# Patient Record
Sex: Female | Born: 1945 | Race: White | Hispanic: No | Marital: Married | Smoking: Former smoker
Health system: Southern US, Community
[De-identification: ages and names within clinical notes are randomized; demographics above are authoritative.]

## PROBLEM LIST (undated history)

## (undated) DIAGNOSIS — Z86018 Personal history of other benign neoplasm: Secondary | ICD-10-CM

## (undated) DIAGNOSIS — M1990 Unspecified osteoarthritis, unspecified site: Secondary | ICD-10-CM

## (undated) DIAGNOSIS — R3915 Urgency of urination: Secondary | ICD-10-CM

## (undated) DIAGNOSIS — K08109 Complete loss of teeth, unspecified cause, unspecified class: Secondary | ICD-10-CM

## (undated) DIAGNOSIS — M949 Disorder of cartilage, unspecified: Secondary | ICD-10-CM

## (undated) DIAGNOSIS — D509 Iron deficiency anemia, unspecified: Secondary | ICD-10-CM

## (undated) DIAGNOSIS — K469 Unspecified abdominal hernia without obstruction or gangrene: Secondary | ICD-10-CM

## (undated) DIAGNOSIS — Z972 Presence of dental prosthetic device (complete) (partial): Secondary | ICD-10-CM

## (undated) DIAGNOSIS — E782 Mixed hyperlipidemia: Secondary | ICD-10-CM

## (undated) DIAGNOSIS — K219 Gastro-esophageal reflux disease without esophagitis: Secondary | ICD-10-CM

## (undated) DIAGNOSIS — N201 Calculus of ureter: Secondary | ICD-10-CM

## (undated) DIAGNOSIS — Z170 Estrogen receptor positive status [ER+]: Secondary | ICD-10-CM

## (undated) DIAGNOSIS — M899 Disorder of bone, unspecified: Secondary | ICD-10-CM

## (undated) DIAGNOSIS — N183 Chronic kidney disease, stage 3 unspecified: Secondary | ICD-10-CM

## (undated) DIAGNOSIS — I10 Essential (primary) hypertension: Secondary | ICD-10-CM

## (undated) DIAGNOSIS — Z923 Personal history of irradiation: Secondary | ICD-10-CM

## (undated) DIAGNOSIS — N200 Calculus of kidney: Secondary | ICD-10-CM

## (undated) DIAGNOSIS — Z87442 Personal history of urinary calculi: Secondary | ICD-10-CM

## (undated) DIAGNOSIS — Z973 Presence of spectacles and contact lenses: Secondary | ICD-10-CM

## (undated) DIAGNOSIS — C50312 Malignant neoplasm of lower-inner quadrant of left female breast: Secondary | ICD-10-CM

## (undated) DIAGNOSIS — E119 Type 2 diabetes mellitus without complications: Secondary | ICD-10-CM

## (undated) HISTORY — DX: Essential (primary) hypertension: I10

## (undated) HISTORY — DX: Disorder of cartilage, unspecified: M94.9

## (undated) HISTORY — PX: COLONOSCOPY W/ BIOPSIES AND POLYPECTOMY: SHX1376

## (undated) HISTORY — PX: TOTAL ABDOMINAL HYSTERECTOMY W/ BILATERAL SALPINGOOPHORECTOMY: SHX83

## (undated) HISTORY — PX: TARSAL METATARSAL ARTHRODESIS: SHX2481

## (undated) HISTORY — DX: Disorder of bone, unspecified: M89.9

---

## 1999-10-29 ENCOUNTER — Other Ambulatory Visit: Admission: RE | Admit: 1999-10-29 | Discharge: 1999-11-19 | Payer: Self-pay | Admitting: Family Medicine

## 2001-03-01 ENCOUNTER — Other Ambulatory Visit: Admission: RE | Admit: 2001-03-01 | Discharge: 2001-03-01 | Payer: Self-pay | Admitting: Family Medicine

## 2001-04-18 ENCOUNTER — Ambulatory Visit (HOSPITAL_COMMUNITY): Admission: RE | Admit: 2001-04-18 | Discharge: 2001-04-18 | Payer: Self-pay | Admitting: Obstetrics & Gynecology

## 2001-05-04 ENCOUNTER — Ambulatory Visit (HOSPITAL_COMMUNITY): Admission: RE | Admit: 2001-05-04 | Discharge: 2001-05-04 | Payer: Self-pay | Admitting: Gastroenterology

## 2001-05-04 ENCOUNTER — Encounter: Payer: Self-pay | Admitting: Gastroenterology

## 2001-05-04 ENCOUNTER — Encounter (INDEPENDENT_AMBULATORY_CARE_PROVIDER_SITE_OTHER): Payer: Self-pay | Admitting: *Deleted

## 2002-03-17 ENCOUNTER — Other Ambulatory Visit: Admission: RE | Admit: 2002-03-17 | Discharge: 2002-03-17 | Payer: Self-pay | Admitting: Family Medicine

## 2003-11-17 HISTORY — PX: ABDOMINAL HYSTERECTOMY: SHX81

## 2004-10-06 ENCOUNTER — Ambulatory Visit (HOSPITAL_COMMUNITY): Admission: RE | Admit: 2004-10-06 | Discharge: 2004-10-06 | Payer: Self-pay | Admitting: Family Medicine

## 2004-10-14 ENCOUNTER — Ambulatory Visit: Admission: RE | Admit: 2004-10-14 | Discharge: 2004-10-14 | Payer: Self-pay | Admitting: Gynecology

## 2004-10-21 ENCOUNTER — Encounter (INDEPENDENT_AMBULATORY_CARE_PROVIDER_SITE_OTHER): Payer: Self-pay | Admitting: *Deleted

## 2004-10-21 ENCOUNTER — Inpatient Hospital Stay (HOSPITAL_COMMUNITY): Admission: RE | Admit: 2004-10-21 | Discharge: 2004-10-23 | Payer: Self-pay | Admitting: Obstetrics & Gynecology

## 2004-12-02 ENCOUNTER — Ambulatory Visit: Admission: RE | Admit: 2004-12-02 | Discharge: 2004-12-02 | Payer: Self-pay | Admitting: Gynecology

## 2005-03-26 ENCOUNTER — Ambulatory Visit (HOSPITAL_COMMUNITY): Admission: RE | Admit: 2005-03-26 | Discharge: 2005-03-26 | Payer: Self-pay | Admitting: Urology

## 2005-11-16 HISTORY — PX: CATARACT EXTRACTION W/ INTRAOCULAR LENS  IMPLANT, BILATERAL: SHX1307

## 2007-09-07 ENCOUNTER — Ambulatory Visit (HOSPITAL_BASED_OUTPATIENT_CLINIC_OR_DEPARTMENT_OTHER): Admission: RE | Admit: 2007-09-07 | Discharge: 2007-09-08 | Payer: Self-pay | Admitting: Orthopedic Surgery

## 2010-02-23 ENCOUNTER — Emergency Department (HOSPITAL_COMMUNITY): Admission: EM | Admit: 2010-02-23 | Discharge: 2010-02-23 | Payer: Self-pay | Admitting: Emergency Medicine

## 2011-02-04 LAB — URINE MICROSCOPIC-ADD ON

## 2011-02-04 LAB — DIFFERENTIAL
Basophils Relative: 0 % (ref 0–1)
Lymphocytes Relative: 14 % (ref 12–46)
Lymphs Abs: 1.2 10*3/uL (ref 0.7–4.0)
Monocytes Absolute: 0.7 10*3/uL (ref 0.1–1.0)
Monocytes Relative: 8 % (ref 3–12)
Neutro Abs: 6.8 10*3/uL (ref 1.7–7.7)

## 2011-02-04 LAB — BASIC METABOLIC PANEL
CO2: 25 mEq/L (ref 19–32)
Calcium: 9.9 mg/dL (ref 8.4–10.5)
Chloride: 102 mEq/L (ref 96–112)
GFR calc Af Amer: 48 mL/min — ABNORMAL LOW (ref >60)
Sodium: 137 mEq/L (ref 135–145)

## 2011-02-04 LAB — URINALYSIS, ROUTINE W REFLEX MICROSCOPIC
Glucose, UA: NEGATIVE mg/dL
Specific Gravity, Urine: 1.025 (ref 1.005–1.030)
pH: 5.5 (ref 5.0–8.0)

## 2011-02-04 LAB — CBC
Hemoglobin: 12.7 g/dL (ref 12.0–15.0)
MCHC: 34.1 g/dL (ref 30.0–36.0)
RBC: 4.6 MIL/uL (ref 3.87–5.11)
WBC: 8.8 10*3/uL (ref 4.0–10.5)

## 2011-02-04 LAB — URINE CULTURE: Colony Count: 35000

## 2011-03-31 NOTE — Op Note (Signed)
NAMEREIS, PIENTA                 ACCOUNT NO.:  [id number]   MEDICAL RECORD NO.:  [id number]          PATIENT TYPE:  AMB   LOCATION:  DSC                          FACILITY:  [facility]   PHYSICIAN:  Leonides Grills, M.D.     DATE OF BIRTH:  1946-07-12   DATE OF PROCEDURE:  09/07/2007  DATE OF DISCHARGE:                               OPERATIVE REPORT   PREOPERATIVE DIAGNOSES:  1. 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  2. Left tight gastroc.   POSTOPERATIVE DIAGNOSES:  1. 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  2. Left tight gastroc.  3. No arthritis by x-ray guidance left third tarsometatarsal joint.   OPERATION:  1. Left first and second tarsometatarsal joint fusion.  2. Left local bone graft.  3. Left gastroc slide.  4. Stress x-rays left foot.  5. Left superficial peroneal nerve neurolysis.   ANESTHESIA:  General.   SURGEON:  Leonides Grills MD   ASSISTANT:  Evlyn Kanner, P.A.   ESTIMATED BLOOD LOSS:  Minimal.   TOURNIQUET TIME:  Approximately an hour 20 minutes.   COMPLICATIONS:  None.   DISPOSITION:  Stable to PR.   INDICATIONS:  This is a 65 year old female who has had longstanding left  medial mid foot pain that is interfering with her life to the point  where she can not do what she wants to do despite concern management.  She was consented for the above procedure.  All risks which include  infection, nerve or vessel injury, nonunion, malunion, hardware  irritation, hardware failure, persistent pain, worse pain, prolonged  recovery, stiffness, arthritis were all explained questions were  encouraged answered.   OPERATION:  Brought operating room, placed in supine position after  adequate general endotracheal tube anesthesia administered with block as  well as Ancef gram IV piggyback.  A bump was placed left ipsilateral hip  slightly internally rotating left lower extremity, left lower  extremity  was prepped and draped in sterile manner over proximally placed thigh  tourniquet.  Limb was gravity exsanguinated.  Tourniquet elevated to 290  mmHg, longitudinal incision medial aspect gastrocnemius muscle tendon  junction was then made.  Dissection was carried down through skin.  Hemostasis was obtained.  Fascia was opened line with the incision.  Conjoined region was then developed between gastroc soleus.  Soft tissue  was then elevated off the posterior aspect gastrocnemius, sural nerve  was identified and protected posteriorly. Gastrocnemius then released  with curved Mayo scissors protecting soft tissue posteriorly.  This had  excellent release tight gastroc areas copiously irrigated normal saline.  Subcu was closed to a 3-0 Vicryl.  Skin was closed 4-0 Monocryl.  Subcuticular stitch.  Steri-Strips were applied.  Longitudinal incision  midline between the EHL and EHB was then made.  Dissection was carried  down through skin.  Hemostasis was obtained.  Superficial peroneal nerve  was identified and superficial peroneal nerve neurolysis was then  performed.     Once superficial peroneal nerve neurolysis performed, this was  mobilized and retracted out of harm's way.  The neurovascular  structures, namely dorsalis pedis deep peroneal nerve was also mobilized  and retracted out of harm's way as well.  Soft tissue was then elevated  off the first and second TMT joints.  There was a large osteophyte  dorsally.  This was then removed with a rongeur as well as a curved  quarter inch osteotome.  This was placed on back table as local bone  graft.  We then entered the first and second TMP joint as well as the  inner cuneiform area.  These were all debrided of cartilage and soft  tissue remnants once this was down to bone and we had excellent apposing  bone surfaces multiple 2-mm drill holes were placed on either side of  the joints respectively as well as a Lisfranc region as  well.  Once this  was done we then used a two-point reduction clamp reduced the second TMT  joint and under compression were able to reduce this beautifully.  We  then made an incision medially with nick and spread technique down to  the medial cuneiform and a 3.5 mm fully threaded cortical set screw was  placed using a 2.5-mm drill hole respectively.  This had excellent  purchase and maintenance of the correction.  We then visualized and we  then obtained stress x-rays AP lateral planes showed no gross motion  across this area, fixation proper position excellent alignment as well.  We also visualize the third TMT joint getting it perfectly tangential  with the joint and was no arthritis across this joint.  We elected not  to perform a third TMT joint fusion at this point.  We then placed a two-  point reduction clamp across the first TMT joint and created and  compressed this with two-point reduction clamp.  We then created a notch  approximately 2 cm distal to the first TMT joint and the base of first  metatarsal.  We then placed a 3.5-mm fully threaded cortical set screw  using a 2.5-mm drill hole respectively.  This had excellent purchase and  maintenance of the compression.  We then placed another lag screw from  the middle cuneiform into the base of first metatarsal as well and this  was countersunk.  Stress strain relieving local bone grafts applied to  the first and second TMT joint and Lisfranc region using stress strain  relieving bone graft.  Final stress x-rays were obtained AP lateral  oblique planes showed no gross motion across fusion site, fixation  proper position excellent alignment as well.  Tourniquet deflated.  Hemostasis was obtained.  Subcu was closed with 3-0 Vicryl.  Skin was  closed 4-0 nylon.  Sterile dressing was applied.  Modified Jones  dressing was applied.  The ankle in neutral dorsiflexion.  Patient  stable to PR.      Leonides Grills, M.D.   Electronically Signed     PB/MEDQ  D:  09/07/2007  T:  09/08/2007  Job:  [id number]

## 2011-04-03 NOTE — Op Note (Signed)
[name], FAHY                 ACCOUNT NO.:  [id number]   MEDICAL RECORD NO.:  [id number]          PATIENT TYPE:  INP   LOCATION:  0478                         FACILITY:  [facility]   PHYSICIAN:  John T. Kyla Balzarine, M.D.    DATE OF BIRTH:  01/04/1946   DATE OF PROCEDURE:  10/21/2004  DATE OF DISCHARGE:                                 OPERATIVE REPORT   SURGEON:  John T. Kyla Balzarine, M.D.   ASSISTANT:  Roseanna Rainbow, M.D.   PREOPERATIVE DIAGNOSES:  Pelvic mass.   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   PROCEDURE:  Exploratory laparotomy with total abdominal hysterectomy and  bilateral salpingo-oophorectomy.   ANESTHESIA:  General endotracheal.   FINDINGS AND INDICATIONS FOR SURGERY:  This 65 year old woman was found to  have a complex pelvic cyst during evaluation for hematuria. She had a normal  preoperative CA 125.  On examination under anesthesia, an 8-9 cm smoothly  cystic mass was palpated in the posterior cul-de-sac, mobile.  On entering  the abdomen, a smoothly encapsulated cystic mass replaced the left ovary,  measuring 8-9 cm in diameter. Frozen section subsequently returned positive  for a fibroadenoma or adenofibroma, benign.  The uterus, tubes and right  ovary were normal.  Intraabdominal contents were normal to inspection and  palpation other than filmy adhesions between the right diaphragm and liver  capsule.   DESCRIPTION OF PROCEDURE:  The patient was prepped and draped in the low  lithotomy position and examination under anesthesia revealed the findings  above. Foley catheter was placed and the patient was explored with a midline  vertical incision which extended from symphysis to umbilicus. The incision  was developed with the scalpel and electrocautery for hemostasis.  The  peritoneum was entered and both manual and visual exploration revealed the  findings described above.  The left tube and ovary were delivered through  the  incision.  The infundibulopelvic ligament and tube was cross clamped,  divided and ligated with free tie followed by suture ligature. All sutures  and ligatures were 2-0 Vicryl unless otherwise specified. The specimen was  handed off for frozen section, evaluation and washings were obtained from  the peritoneal cavity.  A self retaining Bookwalter retractor was placed and  bowel packed out of the pelvis. A total abdominal hysterectomy and bilateral  salpingo-oophorectomy was performed as described below.   The cornu of the uterus were grasped with long Kelly clamps. The round  ligaments bilaterally were divided and hemostatically controlled with  electrocautery. The anterior and posterior leaves of each broad ligament  were opened with electrocautery. Using sharp and blunt dissection, both  pararectal spaces bilaterally were partially developed and ureters  identified.  A window was created above the ureter in the medial leaf of  each broad ligament and the more proximal infundibulopelvic ligaments were  cross clamped, divided and ligated with both free tie and suture ligature.  The anterior peritoneal reflection of the uterus and bladder flap developed  with sharp and blunt dissection. The uterine vessels bilaterally were  skeletonized. Alternating on the right  and left side, pedicles were  developed by clamping the uterine vessels, cardinal and uterosacral  ligaments adjacent to the cervix, dividing and suture ligated.  The upper  vagina was cross clamped and the uterine specimen was amputated.  The  vaginal cuff was closed with locked running #0 Vicryl.  Additional  hemostasis was achieved where necessary with electrocautery.  Frozen section  returned as indicated above, with a benign ovarian tumor.  All packs and  retractors were removed from the abdominal cavity and pelvis was irrigated  with normal saline. The abdominal wall was closed in layers, irrigating and  achieving hemostasis  with electrocautery between layers. The rectus fascia  and muscles were closed in a running mass closure using #0 PDS and skin  reapproximated with skin clips.  The patient tolerated the procedure well  and returned to the recovery room in stable condition.  Estimated blood loss  150 mL.  Transfusions none.  Drains, packs, etc., Foley to dependent  drainage.  Sponge, needle and instrument counts were correct x2.  Pathology  specimen, uterus with dilated tubes and ovaries and peritoneal washings.     John   JTS/MEDQ  D:  10/21/2004  T:  10/21/2004  Job:  [id number]   cc:   Roseanna Rainbow, M.D.   Telford Nab, R.N.  [address]  [place], [place] [address]   Magnus Sinning. Dimple Casey, M.D.  [address] [place]  [place] [address]  Fax: [phone]

## 2011-04-03 NOTE — Consult Note (Signed)
[name], HOLBEN                 ACCOUNT NO.:  [id number]   MEDICAL RECORD NO.:  [id number]          PATIENT TYPE:  OUT   LOCATION:  GYN                          [facility]ACILITY[facility]   PHYSICIAN:  De Blanch, M.D.DATE OF BIRTH:  [birth year]   DATE OF CONSULTATION:  12/02/2004  DATE OF DISCHARGE:                                   CONSULTATION   GYN/ONCOLOGY CLINIC   This 65 year old white female returns for postop checkup.  She underwent TAH  BSO for pelvic mass on December 6.  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 The patient has had an uncomplicated  postoperative course. She reports that she is doing well.  She denies any GI  or GU symptoms, has no pelvic pain, pressure, vaginal, or uterine discharge.  Functional status has returned to nearly normal.   PHYSICAL EXAMINATION:  VITAL SIGNS:  Weight 178 pounds, blood pressure  152/80.  GENERAL:  In general the patient is a healthy white female in no acute  distress.  ABDOMEN:  The abdomen is soft, nontender, no masses or organomegaly or  hernia is noted.  Midline incision is well-healed.  PELVIC EXAM:  HVUS, vagina and urethra are normal. Vaginal cuff is well-  healed.  Bimanual reveals postoperative induration without any masses or  nodularity.   IMPRESSION:  Excellent postoperative recovery following total abdominal  hysterectomy, bilateral salpingo-oophorectomy for a benign ovarian tumor.   The patient given the okay to return to full levels of activity.  She will  return to the care of her primary care physician Dr. Montey Hora.     [name]   DC/MEDQ  D:  12/02/2004  T:  12/02/2004  Job:  [id number]   cc:   Magnus Sinning. Dimple Casey, M.D.  [address] [place]  [place] [address]  Fax: [phone]   Roseanna Rainbow, M.D.   Telford Nab, R.N.  [address]  [place], [place] [address]

## 2011-04-03 NOTE — Consult Note (Signed)
NAMELUANN, [name]                 ACCOUNT NO.:  [id number]   MEDICAL RECORD NO.:  [id number]          PATIENT TYPE:  OUT   LOCATION:  GYN                          FACILITY:  [facility]   PHYSICIAN:  De Blanch, M.D.DATE OF BIRTH:  06-05-1946   DATE OF CONSULTATION:  10/14/2004  DATE OF DISCHARGE:                                   CONSULTATION   REFERRING PHYSICIAN:  Magnus Sinning. Sims, M.D.   REASON FOR CONSULTATION:  [age]-year-old white married female seen in  consultation at the request of [name] regarding management of a  complex pelvic mass.  The patient was undergoing workup for hematuria when a  CAT scan was obtained which showed a right renal stone and a 12.7 x 9.2 x  7.3-cm complex cystic pelvic mass.  On ultrasound, there was no evidence of  ascites and the right ovary appeared normal, as did the uterus.  The patient  denies any significant symptoms, specifically has no pelvic pain, pressure,  GI or GU symptoms.  CA125 value has been obtained which was 17 U/mL.   PAST MEDICAL HISTORY:   MEDICAL ILLNESSES:  1.  Hypertension.  2.  Diabetes.  3.  Osteoporosis.  4.  Hyperlipidemia.  5.  Kidney stones.  6.  Ankle tendinitis.   PAST SURGICAL HISTORY:  Foot surgery.   DRUG ALLERGIES:  None.   CURRENT MEDICATIONS:  Atenolol, Lipitor, hydrochlorothiazide, Evista,  Avandia, Altace, calcium with vitamin D, multivitamins, Arthrotec.   FAMILY HISTORY:  Family history is negative for gynecologic, breast or colon  cancer.   SOCIAL HISTORY:  The patient is married.  She works in a [employer].  She  is a former smoker, but has not smoked in over 10 years.   OBSTETRICAL HISTORY:  Gravida 2.   REVIEW OF SYSTEMS:  Review of systems is negative except as noted above.   PHYSICAL EXAM:  VITAL SIGNS:  Height 5 foot 5 inches.  Weight 181 pounds.  GENERAL:  The patient is a healthy, pleasant white female in no acute  distress.  HEENT:  Negative.  NECK:   Neck is supple without thyromegaly.  LYMPHATICS:  There is no supraclavicular or inguinal adenopathy.  ABDOMEN:  The abdomen is mildly obese, soft and nontender.  No mass,  organomegaly, ascites or hernias are noted.  PELVIC:  EG/BUS, vagina, bladder and urethra are normal.  The cervix is  deviated anteriorly.  The uterus is difficult to outline, but is anterior.  In the cul-de-sac is a smooth cystic mass measuring approximately 10 cm in  diameter.  This is mobile.  Rectovaginal exam confirms there is no  nodularity.  EXTREMITIES:  Lower extremities are without edema or varicosity.  BACK:  Back is without pain.   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   PLAN:  I would recommend the patient undergo exploratory laparotomy, total  abdominal hysterectomy and bilateral salpingo-oophorectomy, and  intraoperative frozen section for surgical guidance.  If this is a  malignancy, I  discussed with the patient and her husband the plans for  proceeding with surgical staging including omentectomy, multiple peritoneal  biopsies, pelvic and para-aortic lymphadenectomy and omentectomy.  The risks  of surgery including hemorrhage, infection, injury to adjacent viscera,  thromboembolic complications and anesthetic risks were discussed with the  patient and her husband; all their questions are answered.   In order to expedite surgery, we will go ahead with surgery next Tuesday and  [name] will be the attending gynecologic-oncologist, and will  work in conjunction with Dr. Roseanna Sims.  The patient and her  husband understand this arrangement and are in agreement.     [name]   DC/MEDQ  D:  10/14/2004  T:  10/14/2004  Job:  [id number]   cc:   [name], M.D.  [address] [place]  [place] [address]  Fax: [phone]   Telford Nab, R.N.  [address]  [place], [place] [address]   [name], M.D.

## 2011-08-26 LAB — BASIC METABOLIC PANEL
CO2: 29
Calcium: 9.5
Chloride: 102
Creatinine, Ser: 0.65
Glucose, Bld: 107 — ABNORMAL HIGH

## 2011-12-21 ENCOUNTER — Encounter: Payer: Self-pay | Admitting: Gastroenterology

## 2011-12-30 ENCOUNTER — Ambulatory Visit: Payer: Self-pay | Admitting: Gastroenterology

## 2012-01-11 ENCOUNTER — Encounter: Payer: Self-pay | Admitting: Gastroenterology

## 2012-01-11 ENCOUNTER — Ambulatory Visit (INDEPENDENT_AMBULATORY_CARE_PROVIDER_SITE_OTHER): Payer: Medicare Other | Admitting: Gastroenterology

## 2012-01-11 DIAGNOSIS — R195 Other fecal abnormalities: Secondary | ICD-10-CM

## 2012-01-11 DIAGNOSIS — E1169 Type 2 diabetes mellitus with other specified complication: Secondary | ICD-10-CM | POA: Insufficient documentation

## 2012-01-11 DIAGNOSIS — E119 Type 2 diabetes mellitus without complications: Secondary | ICD-10-CM

## 2012-01-11 MED ORDER — PEG-KCL-NACL-NASULF-NA ASC-C 100 G PO SOLR: 1.0000 | Freq: Once | ORAL | Status: DC

## 2012-01-11 NOTE — Assessment & Plan Note (Signed)
Plan colonoscopy. If negative I will obtain followup Hemoccults. 

## 2012-01-11 NOTE — Patient Instructions (Signed)
Colonoscopy A colonoscopy is an exam to evaluate your entire colon. In this exam, your colon is cleansed. A long fiberoptic tube is inserted through your rectum and into your colon. The fiberoptic scope (endoscope) is a long bundle of enclosed and very flexible fibers. These fibers transmit light to the area examined and send images from that area to your caregiver. Discomfort is usually minimal. You may be given a drug to help you sleep (sedative) during or prior to the procedure. This exam helps to detect lumps (tumors), polyps, inflammation, and areas of bleeding. Your caregiver may also take a small piece of tissue (biopsy) that will be examined under a microscope. LET YOUR CAREGIVER KNOW ABOUT:   Allergies to food or medicine.   Medicines taken, including vitamins, herbs, eyedrops, over-the-counter medicines, and creams.   Use of steroids (by mouth or creams).   Previous problems with anesthetics or numbing medicines.   History of bleeding problems or blood clots.   Previous surgery.   Other health problems, including diabetes and kidney problems.   Possibility of pregnancy, if this applies.  BEFORE THE PROCEDURE   A clear liquid diet may be required for 2 days before the exam.   Ask your caregiver about changing or stopping your regular medications.   Liquid injections (enemas) or laxatives may be required.   A large amount of electrolyte solution may be given to you to drink over a short period of time. This solution is used to clean out your colon.   You should be present 60 minutes prior to your procedure or as directed by your caregiver.  AFTER THE PROCEDURE   If you received a sedative or pain relieving medication, you will need to arrange for someone to drive you home.   Occasionally, there is a little blood passed with the first bowel movement. Do not be concerned.  FINDING OUT THE RESULTS OF YOUR TEST Not all test results are available during your visit. If your test  results are not back during the visit, make an appointment with your caregiver to find out the results. Do not assume everything is normal if you have not heard from your caregiver or the medical facility. It is important for you to follow up on all of your test results. HOME CARE INSTRUCTIONS   It is not unusual to pass moderate amounts of gas and experience mild abdominal cramping following the procedure. This is due to air being used to inflate your colon during the exam. Walking or a warm pack on your belly (abdomen) may help.   You may resume all normal meals and activities after sedatives and medicines have worn off.   Only take over-the-counter or prescription medicines for pain, discomfort, or fever as directed by your caregiver. Do not use aspirin or blood thinners if a biopsy was taken. Consult your caregiver for medicine usage if biopsies were taken.  SEEK IMMEDIATE MEDICAL CARE IF:   You have a fever.   You pass large blood clots or fill a toilet with blood following the procedure. This may also occur 10 to 14 days following the procedure. This is more likely if a biopsy was taken.   You develop abdominal pain that keeps getting worse and cannot be relieved with medicine.  Document Released: 10/30/2000 Document Revised: 07/15/2011 Document Reviewed: 06/14/2008 [employer] Patient Information 2012 [employer]. 

## 2012-01-11 NOTE — Progress Notes (Signed)
History of Present Illness: [name] is a 66 year old white female referred at the request of Dr. Creta Levin for Hemoccult-positive stools. This was noted on routine testing. She has no GI complaints including change in bowel habits, abdominal pain, melena or hematochezia. Last colonoscopy was in 2002 that demonstrated internal hemorrhoids. She has a history of esophageal stricture that was dilated in 2002.    Past Medical History  Diagnosis Date  . Essential hypertension, benign   . Diabetes mellitus   . Other and unspecified hyperlipidemia   . Disorder of bone and cartilage, unspecified   . Hiatal hernia    Past Surgical History  Procedure Date  . Cataract extraction   . Foot surgery   . Abdominal hysterectomy    family history includes Diabetes in her sister; Hypertension in her mother; and Osteoporosis in her mother. Current Outpatient Prescriptions  Medication Sig Dispense Refill  . alendronate (FOSAMAX) 70 MG tablet Take 70 mg by mouth every 7 (seven) days. Take with a full glass of water on an empty stomach.      Marland Kitchen lisinopril-hydrochlorothiazide (PRINZIDE,ZESTORETIC) 10-12.5 MG per tablet Take 1 tablet by mouth daily.      . metFORMIN (GLUCOPHAGE) 1000 MG tablet Take 1,000 mg by mouth 2 (two) times daily with a meal.      . omeprazole (PRILOSEC) 20 MG capsule Take 20 mg by mouth daily.      . rosuvastatin (CRESTOR) 20 MG tablet Take 20 mg by mouth daily.       Allergies as of 01/11/2012  . (No Known Allergies)    reports that she has quit smoking. She has never used smokeless tobacco. She reports that she does not drink alcohol or use illicit drugs.     Review of Systems: Pertinent positive and negative review of systems were noted in the above HPI section. All other review of systems were otherwise negative.  Vital signs were reviewed in today's medical record Physical Exam: General: Well developed , well nourished, no acute distress Head: Normocephalic and  atraumatic Eyes:  sclerae anicteric, EOMI Ears: Normal auditory acuity Mouth: No deformity or lesions Neck: Supple, no masses or thyromegaly Lungs: Clear throughout to auscultation Heart: Regular rate and rhythm; no murmurs, rubs or bruits Abdomen: Soft, non tender and non distended. No masses, hepatosplenomegaly or hernias noted. Normal Bowel sounds Rectal:deferred Musculoskeletal: Symmetrical with no gross deformities  Skin: No lesions on visible extremities Pulses:  Normal pulses noted Extremities: No clubbing, cyanosis, edema or deformities noted Neurological: Alert oriented x 4, grossly nonfocal Cervical Nodes:  No significant cervical adenopathy Inguinal Nodes: No significant inguinal adenopathy Psychological:  Alert and cooperative. Normal mood and affect

## 2012-01-27 ENCOUNTER — Ambulatory Visit (AMBULATORY_SURGERY_CENTER): Payer: Medicare Other | Admitting: Gastroenterology

## 2012-01-27 ENCOUNTER — Encounter: Payer: Self-pay | Admitting: Gastroenterology

## 2012-01-27 VITALS — BP 146/77 | HR 80 | Temp 97.6°F | Resp 14 | Ht 65.0 in | Wt 185.0 lb

## 2012-01-27 DIAGNOSIS — R195 Other fecal abnormalities: Secondary | ICD-10-CM

## 2012-01-27 DIAGNOSIS — D126 Benign neoplasm of colon, unspecified: Secondary | ICD-10-CM

## 2012-01-27 DIAGNOSIS — D181 Lymphangioma, any site: Secondary | ICD-10-CM

## 2012-01-27 MED ORDER — SODIUM CHLORIDE 0.9 % IV SOLN: 500.0000 mL | INTRAVENOUS | Status: DC

## 2012-01-27 NOTE — Progress Notes (Signed)
Patient did not have preoperative order for IV antibiotic SSI prophylaxis. (G8918)  Patient did not experience any of the following events: a burn prior to discharge; a fall within the facility; wrong site/side/patient/procedure/implant event; or a hospital transfer or hospital admission upon discharge from the facility. (G8907)  

## 2012-01-27 NOTE — Op Note (Signed)
[facility] [address] [place], [place]  [address]  COLONOSCOPY PROCEDURE REPORT  PATIENT:  [name], [name]  MR#:  [id number] BIRTHDATE:  10/23/1946, 65 yrs. old  GENDER:  female ENDOSCOPIST:  Barbette Hair. Arlyce Dice, MD REF. BY:  Talbot Grumbling. Creta Levin, M.D. PROCEDURE DATE:  01/27/2012 PROCEDURE:  Colonoscopy with snare polypectomy ASA CLASS:  Class II INDICATIONS:  heme positive stool MEDICATIONS:   MAC sedation, administered by CRNA propofol 230mg IV  DESCRIPTION OF PROCEDURE:   After the risks benefits and alternatives of the procedure were thoroughly explained, informed consent was obtained.  Digital rectal exam was performed and revealed no abnormalities.   The LB PCF-H180AL [id number] endoscope was introduced through the anus and advanced to the cecum, which was identified by both the appendix and ileocecal valve, without limitations.  The quality of the prep was excellent, using MoviPrep.  The instrument was then slowly withdrawn as the colon was fully examined. <<PROCEDUREIMAGES>>  FINDINGS:  Two polyps were found in the ascending colon (see image5 and image6). 2 and 3mm sessile polyps - removed with cold polypectomy snare and submitted to pathology  A sessile polyp was found in the sigmoid colon. It was 5 mm in size. It was found 10 cm from the point of entry. Polyp was snared without cautery. Retrieval was successful (see image1). snare polyp  A sessile polyp was found in the rectum. 1cm polyp (possible enlarged papilla, mobile, just inside the anal canal. At the head of the polyp were features suggestive of adenomatous change. Polyp was snared, then cauterized with monopolar cautery. Retrieval was successful (see image9). snare polyp  This was otherwise a normal examination of the colon (see image2).   Retroflexed views in the rectum revealed no other findings other than those already described.    The time to cecum =  1) 2.50  minutes. The scope was then withdrawn in   1) 18.0  minutes from the cecum and the procedure completed. COMPLICATIONS:  None ENDOSCOPIC IMPRESSION: 1) Two polyps in the ascending colon 2) 5 mm sessile polyp in the sigmoid colon 3) Sessile polyp in the rectum 4) Otherwise normal examination 5) No other findings other than those already described. RECOMMENDATIONS: 1) If the polyp(s) removed today are proven to be adenomatous (pre-cancerous) polyps, you will need a repeat colonoscopy in 5 years. Otherwise you should continue to follow colorectal cancer screening guidelines for "routine risk" patients with colonoscopy in 10 years. You will receive a letter within 1-2 weeks with the results of your biopsy as well as final recommendations. Please call my office if you have not received a letter after 3 weeks. 2) followup hemeoccults 1 week REPEAT EXAM:  You will receive a letter from Dr. Arlyce Dice in 1-2 weeks, after reviewing the final pathology, with followup recommendations.  ______________________________ Barbette Hair Arlyce Dice, MD  CC:  n. eSIGNED:   Barbette Hair. Eeva Schlosser at 01/27/2012 10:06 AM  Aurelio Jew, [phone]

## 2012-01-27 NOTE — Patient Instructions (Signed)
YOU HAD AN ENDOSCOPIC PROCEDURE TODAY AT THE [facility]: Refer to the procedure report that was given to you for any specific questions about what was found during the examination.  If the procedure report does not answer your questions, please call your gastroenterologist to clarify.  If you requested that your care partner not be given the details of your procedure findings, then the procedure report has been included in a sealed envelope for you to review at your convenience later.  YOU SHOULD EXPECT: Some feelings of bloating in the abdomen. Passage of more gas than usual.  Walking can help get rid of the air that was put into your GI tract during the procedure and reduce the bloating. If you had a lower endoscopy (such as a colonoscopy or flexible sigmoidoscopy) you may notice spotting of blood in your stool or on the toilet paper. If you underwent a bowel prep for your procedure, then you may not have a normal bowel movement for a few days.  DIET: Your first meal following the procedure should be a light meal and then it is ok to progress to your normal diet.  A half-sandwich or bowl of soup is an example of a good first meal.  Heavy or fried foods are harder to digest and may make you feel nauseous or bloated.  Likewise meals heavy in dairy and vegetables can cause extra gas to form and this can also increase the bloating.  Drink plenty of fluids but you should avoid alcoholic beverages for 24 hours.  ACTIVITY: Your care partner should take you home directly after the procedure.  You should plan to take it easy, moving slowly for the rest of the day.  You can resume normal activity the day after the procedure however you should NOT DRIVE or use heavy machinery for 24 hours (because of the sedation medicines used during the test).    SYMPTOMS TO REPORT IMMEDIATELY: A gastroenterologist can be reached at any hour.  During normal business hours, 8:30 AM to 5:00 PM Monday through Friday,  call [phone].  After hours and on weekends, please call the GI answering service at [phone] who will take a message and have the physician on call contact you.   Following lower endoscopy (colonoscopy or flexible sigmoidoscopy):  Excessive amounts of blood in the stool  Significant tenderness or worsening of abdominal pains  Swelling of the abdomen that is new, acute  Fever of 100F or higher      FOLLOW UP: If any biopsies were taken you will be contacted by phone or by letter within the next 1-3 weeks.  Call your gastroenterologist if you have not heard about the biopsies in 3 weeks.  Our staff will call the home number listed on your records the next business day following your procedure to check on you and address any questions or concerns that you may have at that time regarding the information given to you following your procedure. This is a courtesy call and so if there is no answer at the home number and we have not heard from you through the emergency physician on call, we will assume that you have returned to your regular daily activities without incident.    Information on polyps given to you today  stool cards with instructions given to you to check stool for blood & return cards to lab her @ [facility] in basement .  Your blood sugar in recovery room was 103; therefore, be sure  to eat before taking any diabetic medication today. SIGNATURES/CONFIDENTIALITY: You and/or your care partner have signed paperwork which will be entered into your electronic medical record.  These signatures attest to the fact that that the information above on your After Visit Summary has been reviewed and is understood.  Full responsibility of the confidentiality of this discharge information lies with you and/or your care-partner.

## 2012-01-27 NOTE — Progress Notes (Signed)
Propofol was administered by Shon Hough, CRNA. Maw  Hung second bag of normal saline 0.9% 50 ml at 09:33. Maw  The pt tolerated the colonoscopy very well. Maw

## 2012-01-28 ENCOUNTER — Telehealth: Payer: Self-pay | Admitting: *Deleted

## 2012-01-28 NOTE — Telephone Encounter (Signed)
  Follow up Call-  Call back number 01/27/2012  Post procedure Call Back phone  # [phone]  Permission to leave phone message Yes     Patient questions:  Do you have a fever, pain , or abdominal swelling? no Pain Score  0 *  Have you tolerated food without any problems? yes  Have you been able to return to your normal activities? yes  Do you have any questions about your discharge instructions: Diet   no Medications  no Follow up visit  no  Do you have questions or concerns about your Care? yes  Actions: * If pain score is 4 or above: No action needed, pain <4.

## 2012-02-02 ENCOUNTER — Encounter: Payer: Self-pay | Admitting: Gastroenterology

## 2012-02-18 ENCOUNTER — Other Ambulatory Visit: Payer: Medicare Other

## 2012-02-18 LAB — HEMOCCULT SLIDES (X 3 CARDS)
Fecal Occult Blood: NEGATIVE
OCCULT 1: NEGATIVE
OCCULT 2: NEGATIVE
OCCULT 3: NEGATIVE
OCCULT 5: NEGATIVE

## 2012-02-19 ENCOUNTER — Encounter: Payer: Self-pay | Admitting: Gastroenterology

## 2014-11-01 ENCOUNTER — Encounter (HOSPITAL_COMMUNITY): Payer: Self-pay | Admitting: *Deleted

## 2014-11-01 ENCOUNTER — Other Ambulatory Visit: Payer: Self-pay | Admitting: Orthopedic Surgery

## 2014-11-01 MED ORDER — CEFAZOLIN SODIUM-DEXTROSE 2-3 GM-% IV SOLR
2.0000 g | INTRAVENOUS | Status: AC
  Administered 2014-11-02: 2 g via INTRAVENOUS

## 2014-11-01 NOTE — Progress Notes (Signed)
   11/01/14 1833  OBSTRUCTIVE SLEEP APNEA  Have you ever been diagnosed with sleep apnea through a sleep study? No  Do you snore loudly (loud enough to be heard through closed doors)?  1  Do you often feel tired, fatigued, or sleepy during the daytime? 1  Has anyone observed you stop breathing during your sleep? 0  Do you have, or are you being treated for high blood pressure? 1  BMI more than 35 kg/m2? 0  Age over 69 years old? 1  Gender: 0  Obstructive Sleep Apnea Score 4

## 2014-11-01 NOTE — Progress Notes (Signed)
Pt denies SOB, chest pain, and being under the care of a cardiologist. Pt denies having a chest x ray and EKG within the last year. Pt denies having a stress test, echo, and cardiac cath. Pt made aware to not take any Diabetic medications the morning of procedure and to stop taking aspirin, NSAIDs, vitamins and herbal medications.

## 2014-11-02 ENCOUNTER — Encounter (HOSPITAL_COMMUNITY)
Admission: RE | Disposition: A | Discharge status: Home / Self Care | Payer: Self-pay | Source: Ambulatory Visit | Attending: Orthopedic Surgery

## 2014-11-02 ENCOUNTER — Ambulatory Visit (HOSPITAL_COMMUNITY)
Admission: RE | Admit: 2014-11-02 | Discharge: 2014-11-02 | Disposition: A | Discharge status: Home / Self Care | Payer: Medicare Other | Source: Ambulatory Visit | Attending: Orthopedic Surgery | Admitting: Orthopedic Surgery

## 2014-11-02 ENCOUNTER — Encounter (HOSPITAL_COMMUNITY): Payer: Self-pay | Admitting: *Deleted

## 2014-11-02 ENCOUNTER — Ambulatory Visit (HOSPITAL_COMMUNITY): Payer: Medicare Other | Admitting: Anesthesiology

## 2014-11-02 DIAGNOSIS — Z87891 Personal history of nicotine dependence: Secondary | ICD-10-CM | POA: Insufficient documentation

## 2014-11-02 DIAGNOSIS — S52502A Unspecified fracture of the lower end of left radius, initial encounter for closed fracture: Secondary | ICD-10-CM | POA: Insufficient documentation

## 2014-11-02 DIAGNOSIS — Z833 Family history of diabetes mellitus: Secondary | ICD-10-CM | POA: Diagnosis not present

## 2014-11-02 DIAGNOSIS — Z87442 Personal history of urinary calculi: Secondary | ICD-10-CM | POA: Insufficient documentation

## 2014-11-02 DIAGNOSIS — G5602 Carpal tunnel syndrome, left upper limb: Secondary | ICD-10-CM | POA: Insufficient documentation

## 2014-11-02 DIAGNOSIS — E785 Hyperlipidemia, unspecified: Secondary | ICD-10-CM | POA: Insufficient documentation

## 2014-11-02 DIAGNOSIS — W1830XA Fall on same level, unspecified, initial encounter: Secondary | ICD-10-CM | POA: Insufficient documentation

## 2014-11-02 DIAGNOSIS — M1990 Unspecified osteoarthritis, unspecified site: Secondary | ICD-10-CM | POA: Insufficient documentation

## 2014-11-02 DIAGNOSIS — B159 Hepatitis A without hepatic coma: Secondary | ICD-10-CM | POA: Diagnosis not present

## 2014-11-02 DIAGNOSIS — K219 Gastro-esophageal reflux disease without esophagitis: Secondary | ICD-10-CM | POA: Insufficient documentation

## 2014-11-02 DIAGNOSIS — Z8249 Family history of ischemic heart disease and other diseases of the circulatory system: Secondary | ICD-10-CM | POA: Diagnosis not present

## 2014-11-02 DIAGNOSIS — E119 Type 2 diabetes mellitus without complications: Secondary | ICD-10-CM | POA: Insufficient documentation

## 2014-11-02 DIAGNOSIS — Z800 Family history of malignant neoplasm of digestive organs: Secondary | ICD-10-CM | POA: Insufficient documentation

## 2014-11-02 DIAGNOSIS — I10 Essential (primary) hypertension: Secondary | ICD-10-CM | POA: Diagnosis not present

## 2014-11-02 DIAGNOSIS — K449 Diaphragmatic hernia without obstruction or gangrene: Secondary | ICD-10-CM | POA: Diagnosis not present

## 2014-11-02 DIAGNOSIS — M899 Disorder of bone, unspecified: Secondary | ICD-10-CM | POA: Insufficient documentation

## 2014-11-02 HISTORY — DX: Gastro-esophageal reflux disease without esophagitis: K21.9

## 2014-11-02 HISTORY — PX: CARPAL TUNNEL RELEASE: SHX101

## 2014-11-02 HISTORY — DX: Unspecified osteoarthritis, unspecified site: M19.90

## 2014-11-02 HISTORY — PX: OPEN REDUCTION INTERNAL FIXATION (ORIF) DISTAL RADIAL FRACTURE: SHX5989

## 2014-11-02 LAB — BASIC METABOLIC PANEL
ANION GAP: 13 (ref 5–15)
BUN: 19 mg/dL (ref 6–23)
CO2: 28 meq/L (ref 19–32)
Calcium: 10.2 mg/dL (ref 8.4–10.5)
Chloride: 101 mEq/L (ref 96–112)
Creatinine, Ser: 0.86 mg/dL (ref 0.50–1.10)
GFR calc Af Amer: 78 mL/min — ABNORMAL LOW (ref >90)
GFR calc non Af Amer: 67 mL/min — ABNORMAL LOW (ref >90)
GLUCOSE: 145 mg/dL — AB (ref 70–99)
Potassium: 4 mEq/L (ref 3.7–5.3)
SODIUM: 142 meq/L (ref 137–147)

## 2014-11-02 LAB — CBC
HEMATOCRIT: 37.6 % (ref 36.0–46.0)
HEMOGLOBIN: 12.1 g/dL (ref 12.0–15.0)
MCH: 26.4 pg (ref 26.0–34.0)
MCHC: 32.2 g/dL (ref 30.0–36.0)
MCV: 81.9 fL (ref 78.0–100.0)
Platelets: 202 10*3/uL (ref 150–400)
RBC: 4.59 MIL/uL (ref 3.87–5.11)
RDW: 16.1 % — ABNORMAL HIGH (ref 11.5–15.5)
WBC: 7.7 10*3/uL (ref 4.0–10.5)

## 2014-11-02 LAB — GLUCOSE, CAPILLARY
Glucose-Capillary: 136 mg/dL — ABNORMAL HIGH (ref 70–99)
Glucose-Capillary: 144 mg/dL — ABNORMAL HIGH (ref 70–99)

## 2014-11-02 SURGERY — OPEN REDUCTION INTERNAL FIXATION (ORIF) DISTAL RADIUS FRACTURE
Anesthesia: Regional | Laterality: Left

## 2014-11-02 MED ORDER — MIDAZOLAM HCL 2 MG/2ML IJ SOLN
INTRAMUSCULAR | Status: AC
  Filled 2014-11-02: qty 2

## 2014-11-02 MED ORDER — PROPOFOL 10 MG/ML IV BOLUS
INTRAVENOUS | Status: AC
  Filled 2014-11-02: qty 20

## 2014-11-02 MED ORDER — 0.9 % SODIUM CHLORIDE (POUR BTL) OPTIME
TOPICAL | Status: DC | PRN
  Administered 2014-11-02: 1000 mL

## 2014-11-02 MED ORDER — LIDOCAINE HCL (CARDIAC) 20 MG/ML IV SOLN
INTRAVENOUS | Status: AC
  Filled 2014-11-02: qty 5

## 2014-11-02 MED ORDER — ARTIFICIAL TEARS OP OINT
TOPICAL_OINTMENT | OPHTHALMIC | Status: AC
  Filled 2014-11-02: qty 3.5

## 2014-11-02 MED ORDER — ROCURONIUM BROMIDE 50 MG/5ML IV SOLN
INTRAVENOUS | Status: AC
  Filled 2014-11-02: qty 1

## 2014-11-02 MED ORDER — ONDANSETRON HCL 4 MG/2ML IJ SOLN
INTRAMUSCULAR | Status: AC
  Filled 2014-11-02: qty 2

## 2014-11-02 MED ORDER — PROPOFOL 10 MG/ML IV BOLUS
INTRAVENOUS | Status: DC | PRN
  Administered 2014-11-02: 160 mg via INTRAVENOUS

## 2014-11-02 MED ORDER — LACTATED RINGERS IV SOLN
INTRAVENOUS | Status: DC | PRN
  Administered 2014-11-02: 07:00:00 via INTRAVENOUS

## 2014-11-02 MED ORDER — ONDANSETRON HCL 4 MG/2ML IJ SOLN
INTRAMUSCULAR | Status: DC | PRN
  Administered 2014-11-02: 4 mg via INTRAVENOUS

## 2014-11-02 MED ORDER — CHLORHEXIDINE GLUCONATE 4 % EX LIQD
60.0000 mL | Freq: Once | CUTANEOUS | Status: DC
  Filled 2014-11-02: qty 60

## 2014-11-02 MED ORDER — MIDAZOLAM HCL 5 MG/5ML IJ SOLN
INTRAMUSCULAR | Status: DC | PRN
  Administered 2014-11-02 (×2): 1 mg via INTRAVENOUS

## 2014-11-02 MED ORDER — CEFAZOLIN SODIUM-DEXTROSE 2-3 GM-% IV SOLR
INTRAVENOUS | Status: AC
  Filled 2014-11-02: qty 50

## 2014-11-02 MED ORDER — FENTANYL CITRATE 0.05 MG/ML IJ SOLN
INTRAMUSCULAR | Status: AC
  Filled 2014-11-02: qty 5

## 2014-11-02 MED ORDER — SUCCINYLCHOLINE CHLORIDE 20 MG/ML IJ SOLN
INTRAMUSCULAR | Status: AC
  Filled 2014-11-02: qty 1

## 2014-11-02 MED ORDER — OXYCODONE-ACETAMINOPHEN 5-325 MG PO TABS: 1.0000 | ORAL_TABLET | ORAL | Status: DC | PRN

## 2014-11-02 MED ORDER — FENTANYL CITRATE 0.05 MG/ML IJ SOLN
INTRAMUSCULAR | Status: DC | PRN
  Administered 2014-11-02: 50 ug via INTRAVENOUS
  Administered 2014-11-02 (×2): 25 ug via INTRAVENOUS

## 2014-11-02 MED ORDER — EPHEDRINE SULFATE 50 MG/ML IJ SOLN
INTRAMUSCULAR | Status: AC
  Filled 2014-11-02: qty 1

## 2014-11-02 MED ORDER — BUPIVACAINE-EPINEPHRINE (PF) 0.5% -1:200000 IJ SOLN
INTRAMUSCULAR | Status: DC | PRN
  Administered 2014-11-02: 30 mL via PERINEURAL

## 2014-11-02 MED ORDER — PHENYLEPHRINE 40 MCG/ML (10ML) SYRINGE FOR IV PUSH (FOR BLOOD PRESSURE SUPPORT)
PREFILLED_SYRINGE | INTRAVENOUS | Status: AC
  Filled 2014-11-02: qty 10

## 2014-11-02 MED ORDER — ARTIFICIAL TEARS OP OINT
TOPICAL_OINTMENT | OPHTHALMIC | Status: DC | PRN
  Administered 2014-11-02: 1 via OPHTHALMIC

## 2014-11-02 MED ORDER — BUPIVACAINE HCL (PF) 0.25 % IJ SOLN
INTRAMUSCULAR | Status: AC
  Filled 2014-11-02: qty 30

## 2014-11-02 MED ORDER — LIDOCAINE HCL (CARDIAC) 20 MG/ML IV SOLN
INTRAVENOUS | Status: DC | PRN
  Administered 2014-11-02: 70 mg via INTRAVENOUS

## 2014-11-02 MED ORDER — SODIUM CHLORIDE 0.9 % IJ SOLN
INTRAMUSCULAR | Status: AC
  Filled 2014-11-02: qty 10

## 2014-11-02 SURGICAL SUPPLY — 72 items
BANDAGE ELASTIC 3 VELCRO ST LF (GAUZE/BANDAGES/DRESSINGS) ×3 IMPLANT
BANDAGE ELASTIC 4 VELCRO ST LF (GAUZE/BANDAGES/DRESSINGS) ×3 IMPLANT
BIT DRILL 2 FAST STEP (BIT) ×2 IMPLANT
BIT DRILL 2.5X4 QC (BIT) ×2 IMPLANT
BNDG CMPR 9X4 STRL LF SNTH (GAUZE/BANDAGES/DRESSINGS) ×1
BNDG ESMARK 4X9 LF (GAUZE/BANDAGES/DRESSINGS) ×3 IMPLANT
BNDG GAUZE ELAST 4 BULKY (GAUZE/BANDAGES/DRESSINGS) ×4 IMPLANT
CLOSURE STERI-STRIP 1/2X4 (GAUZE/BANDAGES/DRESSINGS) ×1
CLOSURE WOUND 1/2 X4 (GAUZE/BANDAGES/DRESSINGS) ×1
CLSR STERI-STRIP ANTIMIC 1/2X4 (GAUZE/BANDAGES/DRESSINGS) ×1 IMPLANT
CORDS BIPOLAR (ELECTRODE) ×3 IMPLANT
COVER MAYO STAND STRL (DRAPES) IMPLANT
COVER SURGICAL LIGHT HANDLE (MISCELLANEOUS) ×3 IMPLANT
CUFF TOURNIQUET SINGLE 18IN (TOURNIQUET CUFF) ×3 IMPLANT
CUFF TOURNIQUET SINGLE 24IN (TOURNIQUET CUFF) IMPLANT
DRAPE C-ARM MINI 42X72 WSTRAPS (DRAPES) ×3 IMPLANT
DRAPE OEC MINIVIEW 54X84 (DRAPES) IMPLANT
DRAPE SURG 17X23 STRL (DRAPES) ×3 IMPLANT
DURAPREP 26ML APPLICATOR (WOUND CARE) ×3 IMPLANT
ELECT REM PT RETURN 9FT ADLT (ELECTROSURGICAL)
ELECTRODE REM PT RTRN 9FT ADLT (ELECTROSURGICAL) IMPLANT
GAUZE SPONGE 4X4 12PLY STRL (GAUZE/BANDAGES/DRESSINGS) ×3 IMPLANT
GAUZE XEROFORM 1X8 LF (GAUZE/BANDAGES/DRESSINGS) ×3 IMPLANT
GLOVE SURG SYN 8.0 (GLOVE) ×3 IMPLANT
GLOVE SURG SYN 8.0 PF PI (GLOVE) ×1 IMPLANT
GOWN STRL REUS W/ TWL LRG LVL3 (GOWN DISPOSABLE) ×1 IMPLANT
GOWN STRL REUS W/ TWL XL LVL3 (GOWN DISPOSABLE) ×1 IMPLANT
GOWN STRL REUS W/TWL 2XL LVL3 (GOWN DISPOSABLE) ×3 IMPLANT
GOWN STRL REUS W/TWL LRG LVL3 (GOWN DISPOSABLE) ×3
GOWN STRL REUS W/TWL XL LVL3 (GOWN DISPOSABLE) ×3
KIT BASIN OR (CUSTOM PROCEDURE TRAY) ×3 IMPLANT
KIT ROOM TURNOVER OR (KITS) ×3 IMPLANT
NDL HYPO 25GX1X1/2 BEV (NEEDLE) IMPLANT
NEEDLE HYPO 25GX1X1/2 BEV (NEEDLE) IMPLANT
NS IRRIG 1000ML POUR BTL (IV SOLUTION) ×3 IMPLANT
PACK ORTHO EXTREMITY (CUSTOM PROCEDURE TRAY) ×3 IMPLANT
PAD ARMBOARD 7.5X6 YLW CONV (MISCELLANEOUS) ×6 IMPLANT
PAD CAST 3X4 CTTN HI CHSV (CAST SUPPLIES) ×1 IMPLANT
PAD CAST 4YDX4 CTTN HI CHSV (CAST SUPPLIES) ×1 IMPLANT
PADDING CAST COTTON 3X4 STRL (CAST SUPPLIES) ×3
PADDING CAST COTTON 4X4 STRL (CAST SUPPLIES) ×3
PEG FULLY THREADED 2.5X22MM (Peg) ×2 IMPLANT
PEG SUBCHONDRAL SMOOTH 2.0X20 (Peg) ×4 IMPLANT
PEG SUBCHONDRAL SMOOTH 2.0X22 (Peg) ×6 IMPLANT
PEG SUBCHONDRAL SMOOTH 2.0X24 (Peg) ×4 IMPLANT
PENCIL BUTTON HOLSTER BLD 10FT (ELECTRODE) IMPLANT
PLATE STAN 24.4X59.5 LT (Plate) ×2 IMPLANT
SCREW BN 12X3.5XNS CORT TI (Screw) IMPLANT
SCREW BN 13X3.5XNS CORT TI (Screw) IMPLANT
SCREW CORT 3.5X10 LNG (Screw) ×2 IMPLANT
SCREW CORT 3.5X12 (Screw) ×3 IMPLANT
SCREW CORT 3.5X13 (Screw) ×3 IMPLANT
SCREW PEG 2.5X22 NONLOCK (Screw) ×2 IMPLANT
SCREW PEG 2.5X24 NONLOCK (Screw) ×2 IMPLANT
SPONGE GAUZE 4X4 12PLY STER LF (GAUZE/BANDAGES/DRESSINGS) ×2 IMPLANT
SPONGE LAP 4X18 X RAY DECT (DISPOSABLE) IMPLANT
STRIP CLOSURE SKIN 1/2X4 (GAUZE/BANDAGES/DRESSINGS) ×2 IMPLANT
SUT ETHILON 4 0 PS 2 18 (SUTURE) IMPLANT
SUT PROLENE 3 0 PS 2 (SUTURE) ×2 IMPLANT
SUT VIC AB 2-0 CT1 27 (SUTURE) ×3
SUT VIC AB 2-0 CT1 TAPERPNT 27 (SUTURE) IMPLANT
SUT VIC AB 3-0 FS2 27 (SUTURE) IMPLANT
SUT VIC AB 3-0 PS2 18 (SUTURE)
SUT VIC AB 3-0 PS2 18XBRD (SUTURE) IMPLANT
SUT VICRYL 4-0 PS2 18IN ABS (SUTURE) ×2 IMPLANT
SYR CONTROL 10ML LL (SYRINGE) IMPLANT
TOWEL OR 17X24 6PK STRL BLUE (TOWEL DISPOSABLE) ×3 IMPLANT
TOWEL OR 17X26 10 PK STRL BLUE (TOWEL DISPOSABLE) ×3 IMPLANT
TUBE CONNECTING 12'X1/4 (SUCTIONS)
TUBE CONNECTING 12X1/4 (SUCTIONS) IMPLANT
UNDERPAD 30X30 INCONTINENT (UNDERPADS AND DIAPERS) ×3 IMPLANT
WATER STERILE IRR 1000ML POUR (IV SOLUTION) ×3 IMPLANT

## 2014-11-02 NOTE — Op Note (Signed)
See note [id number]

## 2014-11-02 NOTE — Anesthesia Preprocedure Evaluation (Addendum)
Anesthesia Evaluation  Patient identified by MRN, date of birth, ID band Patient awake    Reviewed: Allergy & Precautions, H&P , NPO status , Patient's Chart, lab work & pertinent test results  Airway Mallampati: II   Neck ROM: full    Dental   Pulmonary former smoker,          Cardiovascular hypertension, Pt. on medications     Neuro/Psych  Neuromuscular disease    GI/Hepatic hiatal hernia, GERD-  ,(+) Hepatitis -, AHepatitis A as a child.   Endo/Other  diabetes  Renal/GU      Musculoskeletal  (+) Arthritis -,   Abdominal   Peds  Hematology   Anesthesia Other Findings   Reproductive/Obstetrics                            Anesthesia Physical Anesthesia Plan  ASA: II  Anesthesia Plan: General and Regional   Post-op Pain Management: MAC Combined w/ Regional for Post-op pain   Induction: Intravenous  Airway Management Planned: LMA  Additional Equipment:   Intra-op Plan:   Post-operative Plan:   Informed Consent: I have reviewed the patients History and Physical, chart, labs and discussed the procedure including the risks, benefits and alternatives for the proposed anesthesia with the patient or authorized representative who has indicated his/her understanding and acceptance.     Plan Discussed with: CRNA, Anesthesiologist and Surgeon  Anesthesia Plan Comments:         Anesthesia Quick Evaluation

## 2014-11-02 NOTE — Transfer of Care (Signed)
Immediate Anesthesia Transfer of Care Note  Patient: [name]  Procedure(s) Performed: Procedure(s): OPEN REDUCTION INTERNAL FIXATION (ORIF) DISTAL RADIAL FRACTURE (Left) CARPAL TUNNEL RELEASE (Left)  Patient Location: PACU  Anesthesia Type:General  Level of Consciousness: awake, alert  and oriented  Airway & Oxygen Therapy: Patient Spontanous Breathing and Patient connected to nasal cannula oxygen  Post-op Assessment: Report given to PACU RN and Post -op Vital signs reviewed and stable  Post vital signs: Reviewed and stable  Complications: No apparent anesthesia complications

## 2014-11-02 NOTE — H&P (Signed)
[name] is an 68 y.o. female.   Chief Complaint: left wrist pain and deformity HPI: as above s/p fall with displaced left distal radius fracture with median numbness  Past Medical History  Diagnosis Date  . Essential hypertension, benign   . Diabetes mellitus   . Other and unspecified hyperlipidemia   . Disorder of bone and cartilage, unspecified   . Hiatal hernia   . Nonspecific abnormal finding in stool contents 01/11/2012  . GERD (gastroesophageal reflux disease)   . Kidney stones   . Arthritis   . Hepatitis     Hep A as a child    Past Surgical History  Procedure Laterality Date  . Cataract extraction    . Foot surgery    . Abdominal hysterectomy    . Lithotripsy    . Colonoscopy w/ biopsies and polypectomy      Family History  Problem Relation Age of Onset  . Hypertension Mother   . Osteoporosis Mother   . Diabetes Sister     x3  . Colon cancer Neg Hx    Social History:  reports that she has quit smoking. She has never used smokeless tobacco. She reports that she does not drink alcohol or use illicit drugs.  Allergies: No Known Allergies  Medications Prior to Admission  Medication Sig Dispense Refill  . alendronate (FOSAMAX) 70 MG tablet Take 70 mg by mouth every 7 (seven) days. Take with a full glass of water on an empty stomach.    . Cholecalciferol (VITAMIN D-3 PO) Take 1 tablet by mouth daily.    Marland Kitchen losartan-hydrochlorothiazide (HYZAAR) 50-12.5 MG per tablet Take 1 tablet by mouth daily.    . metFORMIN (GLUCOPHAGE) 1000 MG tablet Take 1,000 mg by mouth 2 (two) times daily with a meal.    . Omega-3 Fatty Acids (FISH OIL PO) Take 1 capsule by mouth daily.    Marland Kitchen omeprazole (PRILOSEC) 20 MG capsule Take 20 mg by mouth daily.    . simvastatin (ZOCOR) 40 MG tablet Take 40 mg by mouth daily.      Results for orders placed or performed during the hospital encounter of 11/02/14 (from the past 48 hour(s))  Glucose, capillary     Status: Abnormal   Collection  Time: 11/02/14  5:56 AM  Result Value Ref Range   Glucose-Capillary 136 (H) 70 - 99 mg/dL   No results found.  Review of Systems  All other systems reviewed and are negative.   Blood pressure 163/64, pulse 95, temperature 98.3 F (36.8 C), temperature source Oral, resp. rate 18, height 5\' 5"  (1.651 m), weight 86.41 kg (190 lb 8 oz), SpO2 95 %. Physical Exam  Constitutional: She is oriented to person, place, and time. She appears well-developed and well-nourished.  HENT:  Head: Normocephalic and atraumatic.  Cardiovascular: Normal rate.   Respiratory: Effort normal.  Musculoskeletal:       Left wrist: She exhibits tenderness, bony tenderness, swelling and deformity.  Displaced left distal radius fracture  Neurological: She is alert and oriented to person, place, and time.  Skin: Skin is warm.  Psychiatric: She has a normal mood and affect. Her behavior is normal. Judgment and thought content normal.     Assessment/Plan As above   plan ORIF and CTR  [name] A 11/02/2014, 7:11 AM

## 2014-11-02 NOTE — Anesthesia Postprocedure Evaluation (Signed)
Anesthesia Post Note  Patient: [name]  Procedure(s) Performed: Procedure(s) (LRB): OPEN REDUCTION INTERNAL FIXATION (ORIF) DISTAL RADIAL FRACTURE (Left) CARPAL TUNNEL RELEASE (Left)  Anesthesia type: General  Patient location: PACU  Post pain: Pain level controlled and Adequate analgesia  Post assessment: Post-op Vital signs reviewed, Patient's Cardiovascular Status Stable, Respiratory Function Stable, Patent Airway and Pain level controlled  Last Vitals:  Filed Vitals:   11/02/14 0916  BP:   Pulse:   Temp: 36.7 C  Resp:     Post vital signs: Reviewed and stable  Level of consciousness: awake, alert  and oriented  Complications: No apparent anesthesia complications

## 2014-11-02 NOTE — Progress Notes (Signed)
Seen /eval'd by dr hodierne per my request d/t sats 88-91 on RA, with IS volumes of 1242mls and does not feel SOB/ has strong cough, cap refills and other VS stable. Interviewed pt and is comfortable withpt going home, he spoke with her re" use of pain meds and respiratory effects, acknowledged understanding.

## 2014-11-02 NOTE — Discharge Instructions (Signed)
What to eat:  For your first meals, you should eat lightly; only small meals initially.  If you do not have nausea, you may eat larger meals.  Avoid spicy, greasy and heavy food.    General Anesthesia, Adult, Care After  Refer to this sheet in the next few weeks. These instructions provide you with information on caring for yourself after your procedure. Your health care provider may also give you more specific instructions. Your treatment has been planned according to current medical practices, but problems sometimes occur. Call your health care provider if you have any problems or questions after your procedure.  WHAT TO EXPECT AFTER THE PROCEDURE  After the procedure, it is typical to experience:  Sleepiness.  Nausea and vomiting. HOME CARE INSTRUCTIONS  For the first 24 hours after general anesthesia:  Have a responsible person with you.  Do not drive a car. If you are alone, do not take public transportation.  Do not drink alcohol.  Do not take medicine that has not been prescribed by your health care provider.  Do not sign important papers or make important decisions.  You may resume a normal diet and activities as directed by your health care provider.  Change bandages (dressings) as directed.  If you have questions or problems that seem related to general anesthesia, call the hospital and ask for the anesthetist or anesthesiologist on call. SEEK MEDICAL CARE IF:  You have nausea and vomiting that continue the day after anesthesia.  You develop a rash. SEEK IMMEDIATE MEDICAL CARE IF:  You have difficulty breathing.  You have chest pain.  You have any allergic problems. Document Released: 02/08/2001 Document Revised: 07/05/2013 Document Reviewed: 05/18/2013  [facility] Patient Information 2014 [place], [place].   This patient has been given discharge instructions by this RN.  I used a variety of media:  Verbal, Handouts and Teachback.  The patients family/friends were also included  with discharge instructions.  Belongings were returned to the patient.  This patients pain is tolerable and is able to tolerate fluids.  Prescriptions if available were given to the family and/or the patient with instructions for taking.

## 2014-11-02 NOTE — Anesthesia Procedure Notes (Addendum)
Anesthesia Regional Block:  Infraclavicular brachial plexus block  Pre-Anesthetic Checklist: ,, timeout performed, Correct Patient, Correct Site, Correct Laterality, Correct Procedure, Correct Position, site marked, Risks and benefits discussed,  Surgical consent,  Pre-op evaluation,  At surgeon's request and post-op pain management  Laterality: Left  Prep: chloraprep       Needles:  Injection technique: Single-shot  Needle Type: Echogenic Stimulator Needle     Needle Length: 9cm 9 cm Needle Gauge: 21 and 21 G    Additional Needles:  Procedures: ultrasound guided (picture in chart) and nerve stimulator Infraclavicular brachial plexus block  Nerve Stimulator or Paresthesia:  Response: biceps flexion, 0.45 mA,   Additional Responses:   Narrative:  Start time: 11/02/2014 7:06 AM End time: 11/02/2014 7:16 AM Injection made incrementally with aspirations every 5 mL.  Performed by: Personally  Anesthesiologist: HODIERNE, ADAM  Additional Notes: Functioning IV was confirmed and monitors were applied.  A 33mm 21ga Arrow echogenic stimulator needle was used. Sterile prep and drape,hand hygiene and sterile gloves were used.  Negative aspiration and negative test dose prior to incremental administration of local anesthetic. The patient tolerated the procedure well.  Ultrasound guidance: relevent anatomy identified, needle position confirmed, local anesthetic spread visualized around nerve(s), vascular puncture avoided.  Image printed for medical record.    Procedure Name: LMA Insertion Date/Time: 11/02/2014 7:33 AM Performed by: Susa Loffler Pre-anesthesia Checklist: Patient identified, Patient being monitored, Emergency Drugs available, Timeout performed and Suction available Patient Re-evaluated:Patient Re-evaluated prior to inductionOxygen Delivery Method: Circle system utilized Preoxygenation: Pre-oxygenation with 100% oxygen Intubation Type: IV induction LMA: LMA  inserted LMA Size: 4.0 Number of attempts: 1 Placement Confirmation: positive ETCO2 and breath sounds checked- equal and bilateral Tube secured with: Tape Dental Injury: Teeth and Oropharynx as per pre-operative assessment

## 2014-11-03 NOTE — Op Note (Signed)
[name], [name]                 ACCOUNT NO.:  [id number]  MEDICAL RECORD NO.:  [id number]  LOCATION:  [facility]                         FACILITY:  [facility]  PHYSICIAN:  Sheral Apley. Dorrien Grunder, M.D.DATE OF BIRTH:  Jul 23, 1946  DATE OF PROCEDURE:  11/02/2014 DATE OF DISCHARGE:  11/02/2014                              OPERATIVE REPORT   PREOPERATIVE DIAGNOSIS:  Displaced intra-articular fracture, left distal radius.  Left carpal tunnel syndrome.  POSTOPERATIVE DIAGNOSIS:  Displaced intra-articular fracture, left distal radius.  Left carpal tunnel syndrome.  PROCEDURE:  Open reduction and internal fixation of above with DVR plate and screw and carpal tunnel release.  SURGEON:  Sheral Apley. Burney Gauze, M.D.  ASSISTANT:  Marily Lente Dasnoit, PA-C.  ANESTHESIA:  Axillary block and general.  COMPLICATION:  No complication.  DRAINS:  No drains.  DESCRIPTION OF PROCEDURE:  The patient was taken the operating suite after induction of adequate axillary block analgesia and laryngeal mask airway general anesthetic. Left upper extremity prepped and draped in sterile fashion.  An Esmarch was used to exsanguinate the limb. Tourniquet was inflated to 275 mmHg.  At this point in time, incision was made over the volar aspect of the left wrist between the FCR and radial artery.  Skin incised 5-6 cm.  Dissection was carried down between the two.  The FCR was retracted to the midline, radial artery laterally.  Fascia was split.  Dissection was carried down the pronator quadratus with significant disruption in pronator quadratus off the volar aspect of distal radius with complex intra-articular fracture of the distal radius with a longitudinal split of the proximal fragment. We did a subperiosteal dissection of the fracture fragments as well as releasing brachioradialis off the radial styloid fragment using reduction. We then reduced the longitudinal split in the proximal fragment with a reduction clamp and  placed 2 screws outside the plate from radial to ulnar, perpendicular to the long axis of the shaft to secure the fragment.  Once this was done, we then reduced with longitudinal traction flexion ulnar deviation, and placed a standard DVR plate.  The proximal screws were placed x3 and distal pegs.  We had to remove the 3 radial pegs and reposition the side of the fragment to get a better reduction which was done under fluoroscopic and direct guidance.  At the end of procedure, intraoperative fluoroscopy, AP and lateral, oblique showed reduction in the joint surface and good placement of all hardware.  The wound was thoroughly irrigated.  We then identified the median nerve in the proximal aspect wound traced it the edge of the carpal canal.  We then placed a Freer elevator on top of the median nerve to create a path dorsal and volar and under direct vision released the transverse carpal ligament from proximal to distal decompressing the nerve.  We then irrigated and loosely closed in layers with 2-0 undyed Vicryl to reapproximate the pronator quadratus.  We did a 4-0 Vicryl subcutaneously and a 3-0 Prolene subcuticular stitch on the skin.  Steri-Strips, 4x4s, fluffs, and a volar splint was applied.  The patient tolerated procedure well in concealed fashion.     Sheral Apley Burney Gauze, M.D.  MAW/MEDQ  D:  11/02/2014  T:  11/03/2014  Job:  [id number]

## 2014-11-06 ENCOUNTER — Encounter (HOSPITAL_COMMUNITY): Payer: Self-pay | Admitting: Orthopedic Surgery

## 2014-11-16 DIAGNOSIS — Z923 Personal history of irradiation: Secondary | ICD-10-CM

## 2014-11-16 HISTORY — DX: Personal history of irradiation: Z92.3

## 2015-04-24 ENCOUNTER — Other Ambulatory Visit: Payer: Self-pay | Admitting: Family Medicine

## 2015-04-24 DIAGNOSIS — R921 Mammographic calcification found on diagnostic imaging of breast: Secondary | ICD-10-CM

## 2015-05-02 ENCOUNTER — Ambulatory Visit
Admission: RE | Admit: 2015-05-02 | Discharge: 2015-05-02 | Disposition: A | Discharge status: Home / Self Care | Payer: Medicare Other | Source: Ambulatory Visit | Attending: Family Medicine | Admitting: Family Medicine

## 2015-05-02 DIAGNOSIS — R921 Mammographic calcification found on diagnostic imaging of breast: Secondary | ICD-10-CM

## 2015-06-06 ENCOUNTER — Other Ambulatory Visit (HOSPITAL_COMMUNITY): Payer: Self-pay | Admitting: General Surgery

## 2015-06-06 DIAGNOSIS — C50912 Malignant neoplasm of unspecified site of left female breast: Secondary | ICD-10-CM

## 2015-06-07 NOTE — H&P (Signed)
  NTS SOAP Note  Vital Signs:  Vitals as of: 5/95/6387: Systolic 564: Diastolic 78: Heart Rate 94: Temp 98.74F: Height 72ft 5in: Weight 190Lbs 0 Ounces: Pain Level 2: BMI 31.62  BMI : 31.62 kg/m2  Subjective: This 69 year old female presents for of left breast cancer.  Found on routine mammography.  No lump found by patient,  No nipple discharge, family h/o breast cancer.  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  Review of Symptoms:  Constitutional:unremarkable   Head:unremarkable Eyes:unremarkable   sinus problems Cardiovascular:  unremarkable Respiratory:unremarkable Gastrointestinal:  unremarkable   Genitourinary:unremarkable   Musculoskeletal:unremarkable Skin:unremarkable as above Hematolgic/Lymphatic:unremarkable   Allergic/Immunologic:unremarkable   Past Medical History:  Reviewed  Past Medical History  Surgical History: TAH-BSO, left foot and hand surgeries Medical Problems: high cholesterol, HTN, NIDDM Allergies: nkda Medications: alendronate, metformin, losartan, omeprazole, simvastatin, vit d   Social History:Reviewed  Social History  Preferred Language: English Race:  White Ethnicity: Not Hispanic / Latino Age: 65 year Marital Status:  M Alcohol: no   Smoking Status: Never smoker reviewed on 05/30/2015 Functional Status reviewed on 05/30/2015 ------------------------------------------------ Bathing: Normal Cooking: Normal Dressing: Normal Driving: Normal Eating: Normal Managing Meds: Normal Oral Care: Normal Shopping: Normal Toileting: Normal Transferring: Normal Walking: Normal Cognitive Status reviewed on 05/30/2015 ------------------------------------------------ Attention: Normal Decision Making: Normal Language: Normal Memory: Normal Motor: Normal Perception: Normal Problem Solving: Normal Visual and Spatial: Normal   Family History:Reviewed  Family Health History Mother, Deceased; Healthy;   Father, Deceased; Healthy;     Objective Information: General:Well appearing, well nourished in no distress. Neck:Supple without lymphadenopathy.  Heart:RRR, no murmur or gallop.  Normal S1, S2.  No S3, S4.  Lungs:  CTA bilaterally, no wheezes, rhonchi, rales.  Breathing unlabored. No dominant mass, nipple discharge, dimpling in either breast.  Axillas negative for palpable nodes. Mammogram and path reports reviewed.  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  Patient Education:Alternative treatments to surgery were discussed with patient (and family).  Risks and benefits  of procedure including bleeding, infection, and the possibility of unclear margins were fully explained to the patient (and family) who gave informed consent. Patient/family questions were addressed.

## 2015-06-13 NOTE — Patient Instructions (Addendum)
[name]  06/13/2015     @PREFPERIOPPHARMACY @   Your procedure is scheduled on 06/26/2015.  Report to [facility] at 7:30 A.M.  Call this number if you have problems the morning of surgery:  [phone]   Remember:  Do not eat food or drink liquids after midnight.  Take these medicines the morning of surgery with A SIP OF WATER: Hyzaar,  Prilosec,   DO NOT TAKE METFORMIN AM OF SURGERY   Do not wear jewelry, make-up or nail polish.  Do not wear lotions, powders, or perfumes.  You may wear deodorant.  Do not shave 48 hours prior to surgery.  Men may shave face and neck.  Do not bring valuables to the hospital.  [facility] is not responsible for any belongings or valuables.  Contacts, dentures or bridgework may not be worn into surgery.  Leave your suitcase in the car.  After surgery it may be brought to your room.  For patients admitted to the hospital, discharge time will be determined by your treatment team.  Patients discharged the day of surgery will not be allowed to drive home.    Please read over the following fact sheets that you were given. Surgical Site Infection Prevention and Anesthesia Post-op Instructions      PATIENT INSTRUCTIONS POST-ANESTHESIA  IMMEDIATELY FOLLOWING SURGERY:  Do not drive or operate machinery for the first twenty four hours after surgery.  Do not make any important decisions for twenty four hours after surgery or while taking narcotic pain medications or sedatives.  If you develop intractable nausea and vomiting or a severe headache please notify your doctor immediately.  FOLLOW-UP:  Please make an appointment with your surgeon as instructed. You do not need to follow up with anesthesia unless specifically instructed to do so.  WOUND CARE INSTRUCTIONS (if applicable):  Keep a dry clean dressing on the anesthesia/puncture wound site if there is drainage.  Once the wound has quit draining you may leave it open to air.  Generally you  should leave the bandage intact for twenty four hours unless there is drainage.  If the epidural site drains for more than 36-48 hours please call the anesthesia department.  QUESTIONS?:  Please feel free to call your physician or the hospital operator if you have any questions, and they will be happy to assist you.      Breast Biopsy A breast biopsy is a procedure where a sample of breast tissue is removed from your breast. The tissue is examined under a microscope to see if cancerous cells are present. A breast biopsy is done when there is:  Any undiagnosed breast mass (tumor).  Nipple abnormalities, dimpling, crusting, or ulcerations.  Abnormal discharge from the nipple, especially blood.  Redness, swelling, and pain of the breast.  Calcium deposits (calcifications) or abnormalities seen on a mammogram, ultrasound result, or results of magnetic resonance imaging (MRI).  Suspicious changes in the breast seen on your mammogram. If the tumor is found to be cancerous (malignant), a breast biopsy can help to determine what the best treatment is for you. There are many different types of breast biopsies. Talk to your caregiver about your options and which type is best for you. LET YOUR CAREGIVER KNOW ABOUT:  Allergies to food or medicine.  Medicines taken, including vitamins, herbs, eyedrops, over-the-counter medicines, and creams.  Use of steroids (by mouth or creams).  Previous problems with anesthetics or numbing medicines.  History of bleeding problems or blood clots.  Previous surgery.  Other health problems, including diabetes and kidney problems.  Any recent colds or infections.  Possibility of pregnancy, if this applies. RISKS AND COMPLICATIONS   Bleeding.  Infection.  Allergy to medicines.  Bruising and swelling of the breast.  Alteration in the shape of the breast.  Not finding the lump or abnormality.  Needing more surgery. BEFORE THE PROCEDURE  Arrange  for someone to drive you home after the procedure.  Do not smoke for 2 weeks before the procedure. Stop smoking, if you smoke.  Do not drink alcohol for 24 hours before procedure.  Wear a good support bra to the procedure. PROCEDURE  You may be given a medicine to numb the breast area (local anesthesia) or a medicine to make you sleep (general anesthesia) during the procedure. The following are the different types of biopsies that can be performed.   Fine-needle aspiration--A thin needle is attached to a syringe and inserted into the breast lump. Fluid and cells are removed and then looked at under a microscope. If the breast lump cannot be felt, an ultrasound may be used to help locate the lump and place the needle in the correct area.   Core needle biopsy--A wide, hollow needle (core needle) is inserted into the breast lump 3-6 times to get tissue samples or cores. The samples are removed. The needle is usually placed in the correct area by using an ultrasound or X-ray.   Stereotactic biopsy--X-ray equipment and a computer are used to analyze X-ray pictures of the breast lump. The computer then finds exactly where the core needle needs to be inserted. Tissue samples are removed.   Vacuum-assisted biopsy--A small incision (less than  inch) is made in your breast. A biopsy device that includes a hollow needle and vacuum is passed through the incision and into the breast tissue. The vacuum gently draws abnormal breast tissue into the needle to remove it. This type of biopsy removes a larger tissue sample than a regular core needle biopsy. No stitches are needed, and there is usually little scarring.  Ultrasound-guided core needle biopsy--A high frequency ultrasound helps guide the core needle to the area of the mass or abnormality. An incision is made to insert the needle. Tissue samples are removed.  Open biopsy--A larger incision is made in the breast. Your caregiver will attempt to remove  the whole breast lump or as much as possible. AFTER THE PROCEDURE  You will be taken to the recovery area. If you are doing well and have no problems, you will be allowed to go home.  You may notice bruising on your breast. This is normal.  Your caregiver may apply a pressure dressing on your breast for 24-48 hours. A pressure dressing is a bandage that is wrapped tightly around the chest to stop fluid from collecting underneath tissues. Document Released: 11/02/2005 Document Revised: 02/27/2013 Document Reviewed: 12/03/2011 [facility] Patient Information 2015 [place], [place]. This information is not intended to replace advice given to you by your health care provider. Make sure you discuss any questions you have with your health care provider.

## 2015-06-17 ENCOUNTER — Encounter (HOSPITAL_COMMUNITY)
Admission: RE | Admit: 2015-06-17 | Discharge: 2015-06-17 | Disposition: A | Discharge status: Home / Self Care | Payer: Medicare Other | Source: Ambulatory Visit | Attending: General Surgery | Admitting: General Surgery

## 2015-06-17 ENCOUNTER — Ambulatory Visit (HOSPITAL_COMMUNITY)
Admission: RE | Admit: 2015-06-17 | Discharge: 2015-06-17 | Disposition: A | Discharge status: Home / Self Care | Payer: Medicare Other | Source: Ambulatory Visit | Attending: General Surgery | Admitting: General Surgery

## 2015-06-17 ENCOUNTER — Encounter (HOSPITAL_COMMUNITY): Payer: Self-pay

## 2015-06-17 DIAGNOSIS — Z01812 Encounter for preprocedural laboratory examination: Secondary | ICD-10-CM | POA: Insufficient documentation

## 2015-06-17 DIAGNOSIS — D0592 Unspecified type of carcinoma in situ of left breast: Secondary | ICD-10-CM | POA: Diagnosis not present

## 2015-06-17 DIAGNOSIS — Z01818 Encounter for other preprocedural examination: Secondary | ICD-10-CM | POA: Insufficient documentation

## 2015-06-17 DIAGNOSIS — Z79899 Other long term (current) drug therapy: Secondary | ICD-10-CM | POA: Diagnosis not present

## 2015-06-17 DIAGNOSIS — Z01810 Encounter for preprocedural cardiovascular examination: Secondary | ICD-10-CM | POA: Insufficient documentation

## 2015-06-17 LAB — CBC WITH DIFFERENTIAL/PLATELET
BASOS PCT: 0 % (ref 0–1)
Basophils Absolute: 0 10*3/uL (ref 0.0–0.1)
Eosinophils Absolute: 0.1 10*3/uL (ref 0.0–0.7)
Eosinophils Relative: 1 % (ref 0–5)
HCT: 37 % (ref 36.0–46.0)
Hemoglobin: 11.7 g/dL — ABNORMAL LOW (ref 12.0–15.0)
LYMPHS ABS: 1.6 10*3/uL (ref 0.7–4.0)
Lymphocytes Relative: 24 % (ref 12–46)
MCH: 25.6 pg — ABNORMAL LOW (ref 26.0–34.0)
MCHC: 31.6 g/dL (ref 30.0–36.0)
MCV: 81 fL (ref 78.0–100.0)
MONO ABS: 0.6 10*3/uL (ref 0.1–1.0)
MONOS PCT: 9 % (ref 3–12)
NEUTROS ABS: 4.3 10*3/uL (ref 1.7–7.7)
NEUTROS PCT: 66 % (ref 43–77)
Platelets: 213 10*3/uL (ref 150–400)
RBC: 4.57 MIL/uL (ref 3.87–5.11)
RDW: 16.3 % — ABNORMAL HIGH (ref 11.5–15.5)
WBC: 6.6 10*3/uL (ref 4.0–10.5)

## 2015-06-17 LAB — COMPREHENSIVE METABOLIC PANEL
ALK PHOS: 76 U/L (ref 38–126)
ALT: 27 U/L (ref 14–54)
AST: 28 U/L (ref 15–41)
Albumin: 4 g/dL (ref 3.5–5.0)
Anion gap: 11 (ref 5–15)
BUN: 15 mg/dL (ref 6–20)
CO2: 27 mmol/L (ref 22–32)
CREATININE: 0.82 mg/dL (ref 0.44–1.00)
Calcium: 9.5 mg/dL (ref 8.9–10.3)
Chloride: 101 mmol/L (ref 101–111)
GFR calc non Af Amer: >60 mL/min (ref >60)
Glucose, Bld: 132 mg/dL — ABNORMAL HIGH (ref 65–99)
Potassium: 3.7 mmol/L (ref 3.5–5.1)
SODIUM: 139 mmol/L (ref 135–145)
Total Bilirubin: 0.3 mg/dL (ref 0.3–1.2)
Total Protein: 6.7 g/dL (ref 6.5–8.1)

## 2015-06-26 ENCOUNTER — Ambulatory Visit (HOSPITAL_COMMUNITY): Payer: Medicare Other

## 2015-06-26 ENCOUNTER — Encounter (HOSPITAL_COMMUNITY): Payer: Medicare Other

## 2015-06-26 ENCOUNTER — Ambulatory Visit (HOSPITAL_COMMUNITY)
Admission: RE | Admit: 2015-06-26 | Discharge: 2015-06-26 | Disposition: A | Discharge status: Home / Self Care | Payer: Medicare Other | Source: Ambulatory Visit | Attending: General Surgery | Admitting: General Surgery

## 2015-06-26 ENCOUNTER — Encounter (HOSPITAL_COMMUNITY): Payer: Self-pay | Admitting: *Deleted

## 2015-06-26 ENCOUNTER — Ambulatory Visit (HOSPITAL_COMMUNITY): Payer: Medicare Other | Admitting: Anesthesiology

## 2015-06-26 ENCOUNTER — Encounter (HOSPITAL_COMMUNITY)
Admission: RE | Disposition: A | Discharge status: Home / Self Care | Payer: Self-pay | Source: Ambulatory Visit | Attending: General Surgery

## 2015-06-26 DIAGNOSIS — Z170 Estrogen receptor positive status [ER+]: Secondary | ICD-10-CM | POA: Diagnosis not present

## 2015-06-26 DIAGNOSIS — C50912 Malignant neoplasm of unspecified site of left female breast: Secondary | ICD-10-CM | POA: Diagnosis not present

## 2015-06-26 DIAGNOSIS — I10 Essential (primary) hypertension: Secondary | ICD-10-CM | POA: Diagnosis not present

## 2015-06-26 DIAGNOSIS — D0512 Intraductal carcinoma in situ of left breast: Secondary | ICD-10-CM | POA: Diagnosis present

## 2015-06-26 DIAGNOSIS — Z87442 Personal history of urinary calculi: Secondary | ICD-10-CM | POA: Diagnosis not present

## 2015-06-26 DIAGNOSIS — Z9849 Cataract extraction status, unspecified eye: Secondary | ICD-10-CM | POA: Insufficient documentation

## 2015-06-26 DIAGNOSIS — E119 Type 2 diabetes mellitus without complications: Secondary | ICD-10-CM | POA: Insufficient documentation

## 2015-06-26 DIAGNOSIS — M899 Disorder of bone, unspecified: Secondary | ICD-10-CM | POA: Diagnosis not present

## 2015-06-26 DIAGNOSIS — K219 Gastro-esophageal reflux disease without esophagitis: Secondary | ICD-10-CM | POA: Diagnosis not present

## 2015-06-26 DIAGNOSIS — N6320 Unspecified lump in the left breast, unspecified quadrant: Secondary | ICD-10-CM

## 2015-06-26 DIAGNOSIS — Z90710 Acquired absence of both cervix and uterus: Secondary | ICD-10-CM | POA: Insufficient documentation

## 2015-06-26 DIAGNOSIS — E780 Pure hypercholesterolemia: Secondary | ICD-10-CM | POA: Diagnosis not present

## 2015-06-26 DIAGNOSIS — M1990 Unspecified osteoarthritis, unspecified site: Secondary | ICD-10-CM | POA: Insufficient documentation

## 2015-06-26 DIAGNOSIS — E785 Hyperlipidemia, unspecified: Secondary | ICD-10-CM | POA: Insufficient documentation

## 2015-06-26 HISTORY — PX: PARTIAL MASTECTOMY WITH NEEDLE LOCALIZATION: SHX6008

## 2015-06-26 LAB — GLUCOSE, CAPILLARY
GLUCOSE-CAPILLARY: 110 mg/dL — AB (ref 65–99)
Glucose-Capillary: 126 mg/dL — ABNORMAL HIGH (ref 65–99)

## 2015-06-26 SURGERY — PARTIAL MASTECTOMY WITH NEEDLE LOCALIZATION
Anesthesia: General | Site: Breast | Laterality: Left

## 2015-06-26 MED ORDER — PROPOFOL 10 MG/ML IV BOLUS
INTRAVENOUS | Status: AC
  Filled 2015-06-26: qty 20

## 2015-06-26 MED ORDER — FENTANYL CITRATE (PF) 250 MCG/5ML IJ SOLN
INTRAMUSCULAR | Status: DC | PRN
  Administered 2015-06-26 (×2): 25 ug via INTRAVENOUS
  Administered 2015-06-26: 50 ug via INTRAVENOUS
  Administered 2015-06-26 (×2): 25 ug via INTRAVENOUS

## 2015-06-26 MED ORDER — GLYCOPYRROLATE 0.2 MG/ML IJ SOLN
INTRAMUSCULAR | Status: AC
  Filled 2015-06-26: qty 1

## 2015-06-26 MED ORDER — ONDANSETRON HCL 4 MG/2ML IJ SOLN
INTRAMUSCULAR | Status: AC
  Filled 2015-06-26: qty 2

## 2015-06-26 MED ORDER — ONDANSETRON HCL 4 MG/2ML IJ SOLN: 4.0000 mg | Freq: Once | INTRAMUSCULAR | Status: DC | PRN

## 2015-06-26 MED ORDER — BUPIVACAINE HCL (PF) 0.5 % IJ SOLN
INTRAMUSCULAR | Status: AC
  Filled 2015-06-26: qty 30

## 2015-06-26 MED ORDER — LACTATED RINGERS IV SOLN
INTRAVENOUS | Status: DC
  Administered 2015-06-26: 10:00:00 via INTRAVENOUS

## 2015-06-26 MED ORDER — 0.9 % SODIUM CHLORIDE (POUR BTL) OPTIME
TOPICAL | Status: DC | PRN
  Administered 2015-06-26: 1000 mL

## 2015-06-26 MED ORDER — LIDOCAINE HCL (CARDIAC) 20 MG/ML IV SOLN
INTRAVENOUS | Status: DC | PRN
  Administered 2015-06-26: 40 mg via INTRAVENOUS

## 2015-06-26 MED ORDER — ONDANSETRON HCL 4 MG/2ML IJ SOLN
4.0000 mg | Freq: Once | INTRAMUSCULAR | Status: AC
  Administered 2015-06-26: 4 mg via INTRAVENOUS

## 2015-06-26 MED ORDER — FENTANYL CITRATE (PF) 250 MCG/5ML IJ SOLN
INTRAMUSCULAR | Status: AC
  Filled 2015-06-26: qty 25

## 2015-06-26 MED ORDER — PROPOFOL 10 MG/ML IV BOLUS
INTRAVENOUS | Status: DC | PRN
  Administered 2015-06-26: 100 mg via INTRAVENOUS
  Administered 2015-06-26: 20 mg via INTRAVENOUS

## 2015-06-26 MED ORDER — LIDOCAINE HCL (PF) 1 % IJ SOLN
INTRAMUSCULAR | Status: AC
  Filled 2015-06-26: qty 5

## 2015-06-26 MED ORDER — BUPIVACAINE HCL (PF) 0.5 % IJ SOLN
INTRAMUSCULAR | Status: DC | PRN
  Administered 2015-06-26: 19 mL

## 2015-06-26 MED ORDER — CEFAZOLIN SODIUM-DEXTROSE 2-3 GM-% IV SOLR
2.0000 g | INTRAVENOUS | Status: AC
  Administered 2015-06-26: 2 g via INTRAVENOUS
  Filled 2015-06-26: qty 50

## 2015-06-26 MED ORDER — KETOROLAC TROMETHAMINE 30 MG/ML IJ SOLN
30.0000 mg | Freq: Once | INTRAMUSCULAR | Status: AC
  Administered 2015-06-26: 30 mg via INTRAVENOUS
  Filled 2015-06-26: qty 1

## 2015-06-26 MED ORDER — PHENYLEPHRINE 40 MCG/ML (10ML) SYRINGE FOR IV PUSH (FOR BLOOD PRESSURE SUPPORT)
PREFILLED_SYRINGE | INTRAVENOUS | Status: AC
  Filled 2015-06-26: qty 10

## 2015-06-26 MED ORDER — OXYCODONE-ACETAMINOPHEN 5-325 MG PO TABS: 1.0000 | ORAL_TABLET | ORAL | Status: DC | PRN

## 2015-06-26 MED ORDER — MIDAZOLAM HCL 2 MG/2ML IJ SOLN
1.0000 mg | INTRAMUSCULAR | Status: DC | PRN
  Administered 2015-06-26: 2 mg via INTRAVENOUS

## 2015-06-26 MED ORDER — FENTANYL CITRATE (PF) 100 MCG/2ML IJ SOLN
25.0000 ug | INTRAMUSCULAR | Status: AC
  Administered 2015-06-26 (×2): 25 ug via INTRAVENOUS

## 2015-06-26 MED ORDER — LIDOCAINE HCL (PF) 2 % IJ SOLN
INTRAMUSCULAR | Status: AC
  Filled 2015-06-26: qty 10

## 2015-06-26 MED ORDER — GLYCOPYRROLATE 0.2 MG/ML IJ SOLN
0.2000 mg | Freq: Once | INTRAMUSCULAR | Status: AC
  Administered 2015-06-26: 0.2 mg via INTRAVENOUS

## 2015-06-26 MED ORDER — MIDAZOLAM HCL 2 MG/2ML IJ SOLN
INTRAMUSCULAR | Status: AC
  Filled 2015-06-26: qty 2

## 2015-06-26 MED ORDER — FENTANYL CITRATE (PF) 100 MCG/2ML IJ SOLN: 25.0000 ug | INTRAMUSCULAR | Status: DC | PRN

## 2015-06-26 MED ORDER — CHLORHEXIDINE GLUCONATE 4 % EX LIQD: 1.0000 "application " | Freq: Once | CUTANEOUS | Status: DC

## 2015-06-26 MED ORDER — FENTANYL CITRATE (PF) 100 MCG/2ML IJ SOLN
INTRAMUSCULAR | Status: AC
  Filled 2015-06-26: qty 2

## 2015-06-26 SURGICAL SUPPLY — 32 items
BAG HAMPER (MISCELLANEOUS) ×3 IMPLANT
CLOSURE WOUND 1/4 X3 (GAUZE/BANDAGES/DRESSINGS)
CLOTH BEACON ORANGE TIMEOUT ST (SAFETY) ×3 IMPLANT
COVER LIGHT HANDLE STERIS (MISCELLANEOUS) ×6 IMPLANT
DECANTER SPIKE VIAL GLASS SM (MISCELLANEOUS) ×3 IMPLANT
DURAPREP 26ML APPLICATOR (WOUND CARE) ×3 IMPLANT
ELECT REM PT RETURN 9FT ADLT (ELECTROSURGICAL) ×3
ELECTRODE REM PT RTRN 9FT ADLT (ELECTROSURGICAL) ×1 IMPLANT
GLOVE BIOGEL PI IND STRL 7.0 (GLOVE) IMPLANT
GLOVE BIOGEL PI INDICATOR 7.0 (GLOVE) ×2
GLOVE ECLIPSE 6.5 STRL STRAW (GLOVE) ×4 IMPLANT
GLOVE EXAM NITRILE PF MED BLUE (GLOVE) ×2 IMPLANT
GLOVE SURG SS PI 7.5 STRL IVOR (GLOVE) ×6 IMPLANT
GOWN STRL REUS W/TWL LRG LVL3 (GOWN DISPOSABLE) ×9 IMPLANT
KIT ROOM TURNOVER APOR (KITS) ×3 IMPLANT
LIQUID BAND (GAUZE/BANDAGES/DRESSINGS) ×2 IMPLANT
MANIFOLD NEPTUNE II (INSTRUMENTS) ×3 IMPLANT
NDL HYPO 25X1 1.5 SAFETY (NEEDLE) ×1 IMPLANT
NEEDLE HYPO 25X1 1.5 SAFETY (NEEDLE) ×3 IMPLANT
NS IRRIG 1000ML POUR BTL (IV SOLUTION) ×3 IMPLANT
PACK MINOR (CUSTOM PROCEDURE TRAY) ×3 IMPLANT
PAD ARMBOARD 7.5X6 YLW CONV (MISCELLANEOUS) ×3 IMPLANT
SET BASIN LINEN APH (SET/KITS/TRAYS/PACK) ×3 IMPLANT
SPONGE GAUZE 2X2 8PLY STER LF (GAUZE/BANDAGES/DRESSINGS)
SPONGE GAUZE 2X2 8PLY STRL LF (GAUZE/BANDAGES/DRESSINGS) IMPLANT
SPONGE LAP 18X18 X RAY DECT (DISPOSABLE) ×3 IMPLANT
STRIP CLOSURE SKIN 1/4X3 (GAUZE/BANDAGES/DRESSINGS) IMPLANT
SUT SILK 2 0 SH (SUTURE) ×2 IMPLANT
SUT VIC AB 3-0 SH 27 (SUTURE) ×3
SUT VIC AB 3-0 SH 27X BRD (SUTURE) ×1 IMPLANT
SUT VIC AB 4-0 PS2 27 (SUTURE) ×3 IMPLANT
SYRINGE 10CC LL (SYRINGE) ×3 IMPLANT

## 2015-06-26 NOTE — Anesthesia Postprocedure Evaluation (Signed)
  Anesthesia Post-op Note  Patient: [name]  Procedure(s) Performed: Procedure(s) with comments: PARTIAL MASTECTOMY WITH NEEDLE LOCALIZATION (Left) - Needle Loc @ 8:00am  Patient Location: PACU  Anesthesia Type:General  Level of Consciousness: awake, alert  and oriented  Airway and Oxygen Therapy: Patient Spontanous Breathing  Post-op Pain: none  Post-op Assessment: Post-op Vital signs reviewed, Patient's Cardiovascular Status Stable, Respiratory Function Stable, Patent Airway and No signs of Nausea or vomiting              Post-op Vital Signs: Reviewed and stable  Last Vitals:  Filed Vitals:   06/26/15 1154  BP: 152/71  Pulse: 87  Temp: 36.6 C  Resp: 14    Complications: No apparent anesthesia complications

## 2015-06-26 NOTE — Transfer of Care (Signed)
Immediate Anesthesia Transfer of Care Note  Patient: [name]  Procedure(s) Performed: Procedure(s) with comments: PARTIAL MASTECTOMY WITH NEEDLE LOCALIZATION (Left) - Needle Loc @ 8:00am  Patient Location: PACU  Anesthesia Type:General  Level of Consciousness: awake and alert   Airway & Oxygen Therapy: Patient Spontanous Breathing and Patient connected to face mask oxygen  Post-op Assessment: Report given to RN  Post vital signs: Reviewed and stable  Last Vitals:  Filed Vitals:   06/26/15 0950  BP: 151/79  Temp:   Resp: 18    Complications: No apparent anesthesia complications

## 2015-06-26 NOTE — Op Note (Signed)
Patient:  [name]  DOB:  22-Aug-1946  MRN:  [id number]   Preop Diagnosis:  Left breast carcinoma  Postop Diagnosis:  Same  Procedure:  Left partial mastectomy after needle localization  Surgeon:  Aviva Signs, M.D.  Anes:  Gen.  Indications:  Patient is a 69 year old white female who was found on core biopsy of left breast to have ductal carcinoma in situ. It is not palpable. The patient now presents for left partial mastectomy after needle localization. The risks and benefits of the procedure including bleeding, infection, and incomplete margins were fully explained to the patient, who gave informed consent.  Procedure note:  The patient had already undergone a left breast needle localization in the radiology department. The patient was brought to the operating room and placed in the supine position. After general anesthesia was administered, the left breast was prepped and draped using the usual sterile technique with DuraPrep. Surgical site confirmation was performed.  An incision was made in the inner, lower quadrant of the left breast where the wire was found. A left partial mastectomy was then performed. A short suture was placed superiorly and a long suture was placed laterally for orientation purposes. The specimen was sent to radiology for specimen radiography. The suspicious area and clip all within the specimen removed. The specimen was then sent to pathology for further examination. The wound was irrigated with normal saline. Any bleeding was controlled using Bovie electrocautery. The subcutaneous layer was reapproximated using 3-0 Vicryl interrupted sutures. The skin was closed using 4-0 Vicryl subcuticular suture. 0.5% Sensorcaine was instilled the surrounding wound. LiquiBand was applied.  All tape and needle counts were correct at the end of the procedure. Patient was awakened and transferred to PACU in stable condition.  Complications:  None  EBL:  Minimal  Specimen:   Left breast tissue, short suture superior, long suture lateral

## 2015-06-26 NOTE — Discharge Instructions (Signed)
Breast Biopsy Care After These instructions give you information on caring for yourself after your procedure. Your doctor may also give you more specific instructions. Call your doctor if you have any problems or questions after your procedure. HOME CARE  Only take medicine as told by your doctor.  Do not take aspirin.  Keep your sutures (stitches) dry when bathing.  Protect the biopsy area. Do not let the area get bumped.  Avoid activities that could pull the biopsy site open until your doctor approves. This includes:  Stretching.  Reaching.  Exercise.  Sports.  Lifting more than 3lb.  Continue your normal diet.  Wear a good support bra for as long as told by your doctor.  Change any bandages (dressings) as told by your doctor.  Do not drink alcohol while taking pain medicine.  Keep all doctor visits as told. Ask when your test results will be ready. Make sure you get your test results. GET HELP RIGHT AWAY IF:   You have a fever.  You have more bleeding (more than a small spot) from the biopsy site.  You have trouble breathing.  You have yellowish-white fluid (pus) coming from the biopsy site.  You have redness, puffiness (swelling), or more pain in the biopsy site.  You have a bad smell coming from the biopsy site.  Your biopsy site opens after sutures, staples, or sticky strips have been removed.  You have a rash.  You need stronger medicine. MAKE SURE YOU:  Understand these instructions.  Will watch your condition.  Will get help right away if you are not doing well or get worse. Document Released: 08/29/2009 Document Revised: 01/25/2012 Document Reviewed: 12/13/2011 [facility] Patient Information 2015 [place], [place]. This information is not intended to replace advice given to you by your health care provider. Make sure you discuss any questions you have with your health care provider.  PATIENT INSTRUCTIONS POST-ANESTHESIA  IMMEDIATELY FOLLOWING  SURGERY:  Do not drive or operate machinery for the first twenty four hours after surgery.  Do not make any important decisions for twenty four hours after surgery or while taking narcotic pain medications or sedatives.  If you develop intractable nausea and vomiting or a severe headache please notify your doctor immediately.  FOLLOW-UP:  Please make an appointment with your surgeon as instructed. You do not need to follow up with anesthesia unless specifically instructed to do so.  WOUND CARE INSTRUCTIONS (if applicable):  Keep a dry clean dressing on the anesthesia/puncture wound site if there is drainage.  Once the wound has quit draining you may leave it open to air.  Generally you should leave the bandage intact for twenty four hours unless there is drainage.  If the epidural site drains for more than 36-48 hours please call the anesthesia department.  QUESTIONS?:  Please feel free to call your physician or the hospital operator if you have any questions, and they will be happy to assist you.

## 2015-06-26 NOTE — Anesthesia Procedure Notes (Signed)
Procedure Name: LMA Insertion Date/Time: 06/26/2015 10:09 AM Performed by: Tressie Stalker E Pre-anesthesia Checklist: Patient identified, Patient being monitored, Emergency Drugs available, Timeout performed and Suction available Patient Re-evaluated:Patient Re-evaluated prior to inductionOxygen Delivery Method: Circle System Utilized Preoxygenation: Pre-oxygenation with 100% oxygen Intubation Type: IV induction Ventilation: Mask ventilation without difficulty LMA: LMA inserted LMA Size: 4.0 Number of attempts: 1 Placement Confirmation: positive ETCO2 and breath sounds checked- equal and bilateral

## 2015-06-26 NOTE — Anesthesia Preprocedure Evaluation (Signed)
Anesthesia Evaluation  Patient identified by MRN, date of birth, ID band Patient awake    Reviewed: Allergy & Precautions, H&P , NPO status , Patient's Chart, lab work & pertinent test results  Airway Mallampati: II  TM Distance: >3 FB Neck ROM: full    Dental  (+) Edentulous Upper, Edentulous Lower   Pulmonary neg pulmonary ROS, former smoker,  breath sounds clear to auscultation        Cardiovascular hypertension, Pt. on medications Rhythm:Regular Rate:Normal     Neuro/Psych  Neuromuscular disease    GI/Hepatic hiatal hernia, GERD-  Medicated and Controlled,(+) Hepatitis -, AHepatitis A as a child.   Endo/Other  diabetes, Type 2, Oral Hypoglycemic Agents  Renal/GU      Musculoskeletal  (+) Arthritis -,   Abdominal   Peds  Hematology   Anesthesia Other Findings   Reproductive/Obstetrics                             Anesthesia Physical Anesthesia Plan  ASA: III  Anesthesia Plan: General   Post-op Pain Management:    Induction: Intravenous  Airway Management Planned: LMA  Additional Equipment:   Intra-op Plan:   Post-operative Plan: Extubation in OR  Informed Consent: I have reviewed the patients History and Physical, chart, labs and discussed the procedure including the risks, benefits and alternatives for the proposed anesthesia with the patient or authorized representative who has indicated his/her understanding and acceptance.     Plan Discussed with:   Anesthesia Plan Comments:         Anesthesia Quick Evaluation

## 2015-06-26 NOTE — Interval H&P Note (Signed)
History and Physical Interval Note:  06/26/2015 9:23 AM  [name]  has presented today for surgery, with the diagnosis of left breast cancer  The various methods of treatment have been discussed with the patient and family. After consideration of risks, benefits and other options for treatment, the patient has consented to  Procedure(s) with comments: PARTIAL MASTECTOMY WITH NEEDLE LOCALIZATION (Left) - Needle Loc @ 8:00am as a surgical intervention .  The patient's history has been reviewed, patient examined, no change in status, stable for surgery.  I have reviewed the patient's chart and labs.  Questions were answered to the patient's satisfaction.     Aviva Signs A

## 2015-06-27 ENCOUNTER — Encounter (HOSPITAL_COMMUNITY): Payer: Self-pay | Admitting: General Surgery

## 2015-07-09 ENCOUNTER — Encounter (HOSPITAL_COMMUNITY): Payer: Self-pay | Admitting: Hematology & Oncology

## 2015-07-09 ENCOUNTER — Encounter (HOSPITAL_COMMUNITY): Payer: Medicare Other | Attending: Hematology & Oncology | Admitting: Hematology & Oncology

## 2015-07-09 VITALS — BP 135/54 | HR 92 | Temp 98.4°F | Resp 16 | Ht 64.5 in | Wt 189.8 lb

## 2015-07-09 DIAGNOSIS — C50912 Malignant neoplasm of unspecified site of left female breast: Secondary | ICD-10-CM | POA: Diagnosis not present

## 2015-07-09 NOTE — Patient Instructions (Signed)
[place] at [facility] Discharge Instructions  RECOMMENDATIONS MADE BY THE CONSULTANT AND ANY TEST RESULTS WILL BE SENT TO YOUR REFERRING PHYSICIAN.  Return to see Dr. Arnoldo Morale on Tuesday 07/16/15 @ 11:45  We will send your tissue from your sentinel node biopsy for Oncotype testing. Oncotype tells [place] if you will benefit from chemo or not.  Return to see Dr. Whitney Muse once Oncotype testing is back.  Call [facility] with any questions  [phone]   Thank you for choosing [place] at [facility] to provide your oncology and hematology care.  To afford each patient quality time with our provider, please arrive at least 15 minutes before your scheduled appointment time.    You need to re-schedule your appointment should you arrive 10 or more minutes late.  We strive to give you quality time with our providers, and arriving late affects you and other patients whose appointments are after yours.  Also, if you no show three or more times for appointments you may be dismissed from the clinic at the providers discretion.     Again, thank you for choosing [facility].  Our hope is that these requests will decrease the amount of time that you wait before being seen by our physicians.       _____________________________________________________________  Should you have questions after your visit to [facility], please contact our office at [phone] between the hours of 8:30 a.m. and 4:30 p.m.  Voicemails left after 4:30 p.m. will not be returned until the following business day.  For prescription refill requests, have your pharmacy contact our office.

## 2015-07-09 NOTE — Progress Notes (Signed)
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 Arnoldo Morale on 06/26/2015, no sentinel node performed   HISTORY OF PRESENTING ILLNESS:  [name] 69 y.o. 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 Her original biopsy was performed on 05/02/2015 showing DCIS. Unfortunately after her definitive surgery, invasive disease was noted. Total tumor dimension was 2 cm. Sentinel node was not obtained. Margins were good with the closest margin being approximately 1 mm.  The patient presents today for additional discussion. She describes her mood is good. She states she has a positive outlook.  She states that she has healed well.  Her Primary is Dr. Sharmaine Base.  She is up to date on all of her screenings. She has no other concerns at this time.    She notes she never felt a palpable mass. The lesion was found on routine screening mammography. She is interested in seeing what other therapy may be needed.  MEDICAL HISTORY:  Past Medical History  Diagnosis Date  . Essential hypertension, benign   . Diabetes mellitus   . Other and unspecified hyperlipidemia   . Nonspecific abnormal finding in stool contents 01/11/2012  . GERD (gastroesophageal reflux disease)   . Arthritis   . Kidney stones   . Hiatal  hernia   . Breast cancer 06/26/15    left  . Hepatitis     Hep A as a child  . Disorder of bone and cartilage, unspecified     SURGICAL HISTORY: Past Surgical History  Procedure Laterality Date  . Cataract extraction    . Foot surgery    . Abdominal hysterectomy    . Lithotripsy    . Colonoscopy w/ biopsies and polypectomy    . Open reduction internal fixation (orif) distal radial fracture Left 11/02/2014    Procedure: OPEN REDUCTION INTERNAL FIXATION (ORIF) DISTAL RADIAL FRACTURE;  Surgeon: Charlotte Crumb, MD;  Location: [facility];  Service: Orthopedics;  Laterality: Left;  . Carpal tunnel release Left 11/02/2014    Procedure: CARPAL TUNNEL RELEASE;  Surgeon: Charlotte Crumb, MD;  Location: [facility];  Service: Orthopedics;  Laterality: Left;  . Partial mastectomy with needle localization Left 06/26/2015    Procedure: PARTIAL MASTECTOMY WITH NEEDLE LOCALIZATION;  Surgeon: Aviva Signs, MD;  Location: [facility];  Service: General;  Laterality: Left;  Needle Loc @ 8:00am  . Axillary sentinel node biopsy Left 07/31/2015    Procedure: SENTINEL LYMPH NODE BIOPSY, LEFT AXILLA, ;  Surgeon: Aviva Signs, MD;  Location: [facility];  Service: General;  Laterality: Left;  Sentinel Node @ 1000    SOCIAL HISTORY: Social History   Social History  . Marital Status: Married    Spouse Name: N/A  . Number of Children: 2  . Years of Education: N/A   Occupational History  . Retired    Social History Main Topics  . Smoking status: Former Research scientist (life sciences)  .  Smokeless tobacco: Never Used  . Alcohol Use: No  . Drug Use: No  . Sexual Activity: Not on file   Other Topics Concern  . Not on file   Social History Narrative  Married, 25 years 2 children Formerly employed in [employer] for 29 years Hobbies include playing [employer] Ex-smoker, Began at age 73, Quit many years ago ETOH, none   FAMILY HISTORY: Family History  Problem Relation Age of Onset  . Hypertension Mother   . Osteoporosis Mother   .  Diabetes Sister     x3  . Colon cancer Neg Hx    has no family status information on file.   ALLERGIES:  has No Known Allergies.   Mother deceased, 33, old age, she had a removal small cancer in lining of stomach Father deceased, 59, car accident 4 sisters. 2 deceased from COPD, heart attacks, age 13; bed ridden with diabetes, COPD, age 72   MEDICATIONS:  Current Outpatient Prescriptions  Medication Sig Dispense Refill  . alendronate (FOSAMAX) 70 MG tablet Take 70 mg by mouth every 7 (seven) days. Take with a full glass of water on an empty stomach.    . Cholecalciferol (VITAMIN D-3 PO) Take 1 tablet by mouth daily.    Marland Kitchen losartan-hydrochlorothiazide (HYZAAR) 50-12.5 MG per tablet Take 1 tablet by mouth daily.    . metFORMIN (GLUCOPHAGE) 1000 MG tablet Take 1,000 mg by mouth 2 (two) times daily with a meal.    . Multiple Vitamins-Calcium (ONE-A-DAY WOMENS PO) Take 1 tablet by mouth daily.    . Omega-3 Fatty Acids (FISH OIL PO) Take 1 capsule by mouth 2 (two) times daily.     Marland Kitchen omeprazole (PRILOSEC) 20 MG capsule Take 20 mg by mouth daily.    Marland Kitchen oxyCODONE-acetaminophen (ROXICET) 5-325 MG per tablet Take 1 tablet by mouth every 4 (four) hours as needed for severe pain. 40 tablet 0  . simvastatin (ZOCOR) 20 MG tablet Take 20 mg by mouth daily.     No current facility-administered medications for this visit.    Review of Systems  Constitutional: Negative for fever, chills, weight loss and malaise/fatigue.  HENT: Negative for congestion, hearing loss, nosebleeds, sore throat and tinnitus.   Eyes: Negative for blurred vision, double vision, pain and discharge.  Respiratory: Negative for cough, hemoptysis, sputum production, shortness of breath and wheezing.   Cardiovascular: Negative for chest pain, palpitations, claudication, leg swelling and PND.  Gastrointestinal: Negative for heartburn, nausea, vomiting, abdominal pain, diarrhea, constipation, blood in stool and melena.    Genitourinary: Negative for dysuria, urgency, frequency and hematuria.  Musculoskeletal: Negative for myalgias, joint pain and falls.  Skin: Negative for itching and rash.  Neurological: Negative for dizziness, tingling, tremors, sensory change, speech change, focal weakness, seizures, loss of consciousness, weakness and headaches.  Endo/Heme/Allergies: Does not bruise/bleed easily.  Psychiatric/Behavioral: Negative for depression, suicidal ideas, memory loss and substance abuse. The patient is not nervous/anxious and does not have insomnia.    14 point ROS was done and is otherwise as detailed above or in HPI   PHYSICAL EXAMINATION: ECOG PERFORMANCE STATUS: 0 - Asymptomatic  Filed Vitals:   07/09/15 1456  BP: 135/54  Pulse: 92  Temp: 98.4 F (36.9 C)  Resp: 16   Filed Weights   07/09/15 1456  Weight: 189 lb 12.8 oz (86.093 kg)     Physical Exam  Constitutional: She is oriented to person, place, and time and well-developed, well-nourished, and in no distress.  HENT:  Head: Normocephalic and atraumatic.  Nose: Nose normal.  Mouth/Throat: Oropharynx is clear and moist. No oropharyngeal exudate.  Eyes: Conjunctivae and EOM are normal. Pupils are equal, round, and reactive to light. Right eye exhibits no discharge. Left eye exhibits no discharge. No scleral icterus.  Neck: Normal range of motion. Neck supple. No tracheal deviation present. No thyromegaly present.  Cardiovascular: Normal rate, regular rhythm and normal heart sounds.  Exam reveals no gallop and no friction rub.   No murmur heard. Pulmonary/Chest: Effort normal and breath sounds normal. She has no wheezes. She has no rales.  Abdominal: Soft. Bowel sounds are normal. She exhibits no distension and no mass. There is no tenderness. There is no rebound and no guarding.  Musculoskeletal: Normal range of motion. She exhibits no edema.  Lymphadenopathy:    She has no cervical adenopathy.  Neurological: She is alert and  oriented to person, place, and time. She has normal reflexes. No cranial nerve deficit. Gait normal. Coordination normal.  Skin: Skin is warm and dry. No rash noted.  Psychiatric: Mood, memory, affect and judgment normal.  Nursing note and vitals reviewed.  left breast is examined with a well-healed surgical incision site from the approximate 8:00 to 4:00 position.  LABORATORY DATA:  I have reviewed the data as listed  Results for PAETON, LATOUCHE (MRN [id number])   Ref. Range 06/17/2015 14:45  Sodium Latest Ref Range: 135-145 mmol/L 139  Potassium Latest Ref Range: 3.5-5.1 mmol/L 3.7  Chloride Latest Ref Range: 101-111 mmol/L 101  CO2 Latest Ref Range: 22-32 mmol/L 27  BUN Latest Ref Range: 6-20 mg/dL 15  Creatinine Latest Ref Range: 0.44-1.00 mg/dL 0.82  Calcium Latest Ref Range: 8.9-10.3 mg/dL 9.5  EGFR (Non-African Amer.) Latest Ref Range: >60 mL/min >60  EGFR (African American) Latest Ref Range: >60 mL/min >60  Glucose Latest Ref Range: 65-99 mg/dL 132 (H)  Anion gap Latest Ref Range: 5-15  11  Alkaline Phosphatase Latest Ref Range: 38-126 U/L 76  Albumin Latest Ref Range: 3.5-5.0 g/dL 4.0  AST Latest Ref Range: 15-41 U/L 28  ALT Latest Ref Range: 14-54 U/L 27  Total Protein Latest Ref Range: 6.5-8.1 g/dL 6.7  Total Bilirubin Latest Ref Range: 0.3-1.2 mg/dL 0.3  WBC Latest Ref Range: 4.0-10.5 K/uL 6.6  RBC Latest Ref Range: 3.87-5.11 MIL/uL 4.57  Hemoglobin Latest Ref Range: 12.0-15.0 g/dL 11.7 (L)  HCT Latest Ref Range: 36.0-46.0 % 37.0  MCV Latest Ref Range: 78.0-100.0 fL 81.0  MCH Latest Ref Range: 26.0-34.0 pg 25.6 (L)  MCHC Latest Ref Range: 30.0-36.0 g/dL 31.6  RDW Latest Ref Range: 11.5-15.5 % 16.3 (H)  Platelets Latest Ref Range: 150-400 K/uL 213  Neutrophils Latest Ref Range: 43-77 % 66  Lymphocytes Latest Ref Range: 12-46 % 24  Monocytes Relative Latest Ref Range: 3-12 % 9  Eosinophil Latest Ref Range: 0-5 % 1  Basophil Latest Ref Range: 0-1 % 0  NEUT# Latest Ref  Range: 1.7-7.7 K/uL 4.3  Lymphocyte # Latest Ref Range: 0.7-4.0 K/uL 1.6  Monocyte # Latest Ref Range: 0.1-1.0 K/uL 0.6  Eosinophils Absolute Latest Ref Range: 0.0-0.7 K/uL 0.1  Basophils Absolute Latest Ref Range: 0.0-0.1 K/uL 0.0    RADIOGRAPHIC STUDIES: I have personally reviewed the radiological images as listed and agreed with the findings in the report. CLINICAL DATA: Post clip mammograms following stereotactic core needle biopsy of left breast calcifications and associated linear/branching soft tissue opacity.  EXAM: DIAGNOSTIC LEFT MAMMOGRAM POST ULTRASOUND BIOPSY  COMPARISON: Previous exam(s).  FINDINGS: Mammographic  images were obtained following stereotactic guided biopsy of left breast calcifications. X shaped biopsy clip lies within the abnormal soft tissue and adjacent to residual calcifications in the lower inner left breast.  IMPRESSION: Well-positioned X shaped biopsy clip following stereotactic core needle biopsy.  Final Assessment: Post Procedure Mammograms for Marker Placement   Electronically Signed  By: Lajean Manes M.D.  On: 05/02/2015 11:43  CLINICAL DATA: Recent diagnosis of breast cancer. Preop for lumpectomy. Slight productive cough.  EXAM: CHEST 2 VIEW  COMPARISON: Chest radiograph 10/17/2004.  FINDINGS: The heart size and mediastinal contours are within normal limits. Both lungs are clear. The visualized skeletal structures are unremarkable. Stable slight eventration or elevation RIGHT hemidiaphragm. Thoracic atherosclerosis.  IMPRESSION: No active cardiopulmonary disease.   Electronically Signed  By: Staci Righter M.D.  On: 06/17/2015 15:16 REPORT OF SURGICAL PATHOLOGY ADDITIONAL INFORMATION: PROGNOSTIC INDICATORS Results: IMMUNOHISTOCHEMICAL AND MORPHOMETRIC ANALYSIS PERFORMED MANUALLY Estrogen Receptor: 100%, POSITIVE, STRONG STAINING INTENSITY Progesterone Receptor: 60%, POSITIVE, STRONG STAINING  INTENSITY Proliferation Marker Ki67: 25% REFERENCE RANGE ESTROGEN RECEPTOR NEGATIVE 0% POSITIVE =>1% REFERENCE RANGE PROGESTERONE RECEPTOR NEGATIVE 0% POSITIVE =>1% All controls stained appropriately Enid Cutter MD Pathologist, Electronic Signature ( Signed 07/02/2015) FLUORESCENCE IN-SITU HYBRIDIZATION Results: HER2 - NEGATIVE RATIO OF HER2/CEP17 SIGNALS 1.44 AVERAGE HER2 COPY NUMBER PER CELL 1.95 Reference Range: NEGATIVE HER2/CEP17 Ratio <2.0 and average HER2 copy number <4.0 EQUIVOCAL HER2/CEP17 Ratio <2.0 and average HER2 copy number >=4.0 and <6.0 1 of 4 FINAL for MAECY, PODGURSKI H ([id number]) ADDITIONAL INFORMATION:(continued) POSITIVE HER2/CEP17 Ratio >=2.0 or <2.0 and average HER2 copy number >=6.0 Enid Cutter MD Pathologist, Electronic Signature ( Signed 07/02/2015) FINAL DIAGNOSIS Diagnosis Breast, lumpectomy, left - INVASIVE DUCTAL CARCINOMA, 2 CM. - HIGH GRADE DUCTAL CARCINOMA IN SITU. - INVASIVE CARCINOMA FOCALLY LESS THAN 0.1 CM FROM LATERAL MARGIN. - DUCTAL CARCINOMA IN SITU FOCALLY LESS THAN 0.1 CM FROM THE LATERAL MARGIN. Microscopic Comment BREAST, INVASIVE TUMOR, WITHOUT LYMPH NODES PRESENT Specimen, including laterality : Left breast. Procedure: Lumpectomy. Histologic type: Ductal. Grade: 2 Tubule formation: 3 Nuclear pleomorphism: 2 Mitotic: 2 Tumor size (gross measurement): 2 cm Margins: Free of tumor. Invasive, distance to closest margin: Less than 0.1 cm from the lateral margin. In-situ, distance to closest margin: Less than 0.1 cm from lateral margin. If margin positive, focally or broadly: N/A Lymphovascular invasion: Not identified. Ductal carcinoma in situ: Present. Grade: High grade. Extensive intraductal component: Yes Lobular neoplasia: No Tumor focality: Unifocal. Treatment effect: No If present, treatment effect in breast tissue, lymph nodes or both: N/A Extent of tumor: Skin: N/A Nipple: N/A Skeletal muscle: N/A Breast  prognostic profile: Pending. Estrogen receptor: Pending. Progesterone receptor: Pending. Her 2 neu: Pending. Ki-67: Pending. Non-neoplastic breast: Fibrocystic changes. 2 of 4 FINAL for SAMATHA, ANSPACH [id number]) Microscopic Comment(continued) TNM: pT1c, pNX, pMX Comments: There is extensive high grade ductal carcinoma in situ associated with moderately differentiated invasive ductal carcinoma. There are multiple foci where the tumor is less than 0.1 cm from the lateral margin. The remaining margins are 1 cm and greater. A breast prognostic profile is pending and will be reported as an addendum. (JDP:ecj 06/28/2015) Claudette Laws MD Pathologist, Electronic Signature (Case signed 06/28/2015) Specimen Gross and Clinical Information Specimen(s) Obtained: Breast, lumpectomy, left Specimen Clinical Information left breast cancer Gross Specimen type: Received in formalin and placed in formalin at 10:50 a.m. on 06/26/15 is a left breast lumpectomy specimen. Size: 8.0 from superior to inferior x 7.0 from anterior to posterior x 1.5 cm from medial to lateral. Orientation:  The specimen has a short suture designating superior and a long suture designating lateral. The specimen is inked as follows: green-anterior, blue-inferior, orange-lateral, yellow-medial, black-posterior, and red-superior. Localized area: A needle localization wire is inserted at the 8 o'clock position, which inserts superiorly and exits medially. Cut surface: Sectioning reveals a 2.0 x 1.8 x 1.0 cm firm gray-white ill-defined, focally cystic lesion within which an X-shaped biopsy clip is identified. The remaining parenchyma is 99% yellow lobulated adipose tissue with minimal dense white fibrous tissue. Margins: The lesion abuts the lateral margin and is located 1.0 cm from the medial and posterior margins, 3.0 cm from the anterior margin, and far from the superior and inferior margin. Prognostic indicators: Not taken at  time of gross. Block summary: The lesion is entirely submitted as follows: A = slice associated with biopsy clip to lateral margin B = representative lesion without margins C-E = representative lesion with lateral margin F = representative posterior, medial, and anterior margin G = representative superior and inferior margins H = representative uninvolved fibrous tissue Eight blocks total (KF:ds 06/27/15) Stain(s) used in Diagnosis: The following stain(s) were used in diagnosing the case: Her2 FISH, ER-ACIS, PR-ACIS, KI-67-ACIS. The control(s) stained appropriately. Disclaimer HER2 IQFISH pharmDX (code [id number]) is a direct fluorescence in-situ hybridization assay designed to quantitatively determine HER2 gene amplification in formalin-fixed, paraffin-embedded tissue specimens. It is performed at [facility] and is reported using ASCO/CAP scoring criteria published in 2013. PR progesterone receptor (PgR 636), immunohistochemical stains are performed on formalin fixed, paraffin embedded tissue using a 3,3"-diaminobenzidine (DAB) chromogen and [employer] Autostainer System. The staining intensity of the nucleus is scored morphometrically using the Automated Cellular Imaging System (ACIS) and is reported as the percentage of tumor cell nuclei demonstrating specific nuclear staining. 3 of 4 FINAL for CHANETTE, DEMO ([id number]) Disclaimer(continued) Estrogen receptor (SP1), immunohistochemical stains are performed on formalin fixed, paraffin embedded tissue using a 3,3"-diaminobenzidine (DAB) chromogen and [employer] Autostainer System. The staining intensity of the nucleus is scored morphometrically using the Automated Cellular Imaging System (ACIS) and is reported as the percentage of tumor cell nuclei demonstrating specific nuclear staining. Ki-67 (Mib-1), immunohistochemical stains are performed on formalin fixed, paraffin embedded tissue using a 3,3"-diaminobenzidine (DAB) chromogen and [place]. The staining intensity of the nucleus is scored morphometrically using the Automated Cellular Imaging System (ACIS) and is reported as the percentage of tumor cell nuclei demonstrating specific nuclear staining. Report signed out from the following location(s) Technical component and interpretation was performed at [facility], [place], [place] [address]. 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 Arnoldo Morale on 06/26/2015, no sentinel node performed   We reviewed breast cancer in a general sense. We discussed different types of breast cancer. We reviewed ER+ and PR+. We discussed the role that this would play in her therapy. We reviewed her breast cancer histology.  We spent time reviewing the national guidelines (NCCN) on breast cancer therapy. Unfortunately given that a 2 cm invasive component was found at the  time of surgery I have recommended that she meet with Dr. Arnoldo Morale again to discuss a sentinel node procedure. She is very healthy and I feel the results of this will help guide appropriate therapy for the patient. Discussed the role of the Oncotype assay and I advised her that we will send this pending the results of her lymph node biopsy.  She understands everything we discussed today. Her questions were answered. We will plan on regrouping after she meets with Dr. Arnoldo Morale.  All questions were answered. The patient knows to  call the clinic with any problems, questions or concerns.   This document serves as a record of services personally performed by Ancil Linsey, MD. It was created on her behalf by Janace Hoard, a trained medical scribe. The creation of this record is based on the scribe's personal observations and the provider's statements to them. This document has been checked and approved by the attending provider.  I have reviewed the above documentation for accuracy and completeness, and I agree with the above.  This note was electronically signed.  Kelby Fam. Whitney Muse, MD

## 2015-07-16 ENCOUNTER — Other Ambulatory Visit (HOSPITAL_COMMUNITY): Payer: Self-pay | Admitting: General Surgery

## 2015-07-16 DIAGNOSIS — C50912 Malignant neoplasm of unspecified site of left female breast: Secondary | ICD-10-CM

## 2015-07-17 NOTE — H&P (Signed)
  [facility] SOAP Note  Vital Signs:  Vitals as of: 08/01/9449: Systolic 388: Diastolic 78: Heart Rate 94: Temp 98.19F: Height 46ft 5in: Weight 190Lbs 0 Ounces: Pain Level 2: BMI 31.62  BMI : 31.62 kg/m2  Subjective: This 69 year old female presents for of left breast cancer.  She underwent a left partial mastectomy for presumed DCIS and was ultimately found to have an invasive ductal carcinoma. She now presents for his left axillary sentinel lymph node biopsy.   Review of Symptoms:  Constitutional:unremarkable   Head:unremarkable Eyes:unremarkable   sinus problems Cardiovascular:  unremarkable Respiratory:unremarkable Gastrointestinal:  unremarkable   Genitourinary:unremarkable   Musculoskeletal:unremarkable Skin:unremarkable as above Hematolgic/Lymphatic:unremarkable   Allergic/Immunologic:unremarkable   Past Medical History:  Reviewed  Past Medical History  Surgical History: TAH-BSO, left foot and hand surgeries Medical Problems: high cholesterol, HTN, NIDDM Allergies: nkda Medications: alendronate, metformin, losartan, omeprazole, simvastatin, vit d   Social History:Reviewed  Social History  Preferred Language: English Race:  White Ethnicity: Not Hispanic / Latino Age: 89 year Marital Status:  M Alcohol: no   Smoking Status: Never smoker reviewed on 05/30/2015 Functional Status reviewed on 05/30/2015 ------------------------------------------------ Bathing: Normal Cooking: Normal Dressing: Normal Driving: Normal Eating: Normal Managing Meds: Normal Oral Care: Normal Shopping: Normal Toileting: Normal Transferring: Normal Walking: Normal Cognitive Status reviewed on 05/30/2015 ------------------------------------------------ Attention: Normal Decision Making: Normal Language: Normal Memory: Normal Motor: Normal Perception: Normal Problem Solving: Normal Visual and Spatial: Normal   Family History:Reviewed  Family Health  History Mother, Deceased; Healthy;  Father, Deceased; Healthy;     Objective Information: General:Well appearing, well nourished in no distress. Neck:Supple without lymphadenopathy.  Heart:RRR, no murmur or gallop.  Normal S1, S2.  No S3, S4.  Lungs:  CTA bilaterally, no wheezes, rhonchi, rales.  Breathing unlabored. No dominant mass, nipple discharge, dimpling in either breast.  Axillas negative for palpable nodes. Mammogram and path reports reviewed.  ER/PR + Assessment:Left breast cancer, DCIS  Diagnoses: Invasive ductal carcinoma, left breast Procedures: 82800 - OFFICE OUTPATIENT NEW 30 MINUTES    Plan: Scheduled for left axillary sentinel lymph node biopsy on 07/31/2015.  Patient Education:Alternative treatments to surgery were discussed with patient (and family).  Risks and benefits  of procedure were fully explained to the patient (and family) who gave informed consent. Patient/family questions were addressed.

## 2015-07-25 NOTE — Patient Instructions (Signed)
[name]  07/25/2015     @PREFPERIOPPHARMACY @   Your procedure is scheduled on  07/31/2015  Report to [facility] at  19  A.M.  Call this number if you have problems the morning of surgery:  [phone]   Remember:  Do not eat food or drink liquids after midnight.  Take these medicines the morning of surgery with A SIP OF WATER  Losartan, prilosec, oxycodone.   Do not wear jewelry, make-up or nail polish.  Do not wear lotions, powders, or perfumes.   Do not shave 48 hours prior to surgery.  Men may shave face and neck.  Do not bring valuables to the hospital.  [facility] is not responsible for any belongings or valuables.  Contacts, dentures or bridgework may not be worn into surgery.  Leave your suitcase in the car.  After surgery it may be brought to your room.  For patients admitted to the hospital, discharge time will be determined by your treatment team.  Patients discharged the day of surgery will not be allowed to drive home.   Name and phone number of your driver:   family Special instructions:  none  Please read over the following fact sheets that you were given. Pain Booklet, Coughing and Deep Breathing, Surgical Site Infection Prevention, Anesthesia Post-op Instructions and Care and Recovery After Surgery      Sentinel Lymph Node Biopsy in Breast Cancer Treatment Sentinel lymph node biopsy is a procedure to identify, remove, and examine one or more lymph nodes for cancer. Lymph nodes are collections of tissue that filter infections, cancer cells, and other waste substances from the bloodstream. Cancer can spread to nearby lymph nodes. It usually spreads to one lymph node first, and then it spreads to others. The first lymph node that the cancer could spread to is called the sentinel lymph node. In some cases, there may be more than one sentinel lymph node. If you have breast cancer, you may have this procedure to determine whether your cancer has  spread. For breast cancer, a sentinel lymph node is usually in your armpit because this is where breast cancer tends to spread first. If no cancer is found in a sentinel lymph node, it is very unlikely that the cancer has spread to any of the other lymph nodes. If cancer is found in a sentinel lymph node, your surgeon may remove additional lymph nodes for examination. LET [facility] CARE PROVIDER KNOW ABOUT:  Any allergies you have.  All medicines you are taking, including vitamins, herbs, eye drops, creams, and over-the-counter medicines.  Previous problems you or members of your family have had with the use of anesthetics.  Any blood disorders you have.  Previous surgeries you have had.  Medical conditions you have. RISKS AND COMPLICATIONS Generally, this is a safe procedure. However, problems can occur and include:  Infection.  Bleeding.  Allergic reaction to the dye used for the procedure.  Staining of the skin where the dye is injected.  Damaged lymph vessels, causing a buildup of fluid (lymphedema).  Pain or bruising at the biopsy site. BEFORE THE PROCEDURE  If you smoke, stop smoking at least 2 weeks before the procedure. This will improve your health after the procedure and reduce your risk of getting a wound infection.  You may have blood tests to make sure your blood clots normally.  Ask your health care provider about changing or stopping your regular medicines. This is especially  important if you are taking diabetes medicines or blood thinners.  Do not eat or drink anything after midnight on the night before the procedure or as directed by your health care provider if you will be having the type of medicine that makes you fall asleep (general anesthetic).  Plan to have someone take you home after the procedure. PROCEDURE  Sentinel node biopsy may be done as an outpatient procedure.  You may be given a general anesthetic or a medicine that numbs only the surgical  area (local anesthetic).  Blue dye or a radioactive substance or both will be injected around the tumor in your breast.  The blue dye will reach your lymph node quickly. It may be given just before surgery.  The radioactive substance will take longer to reach your lymph nodes, so it may be given before you go into the operating area. Both the dye and the radioactive substance will follow the same path that a spreading cancer would be likely to follow.  If a radioactive substance is to be injected,a scanner will show where the substance has spread to help identify the sentinel lymph node.   The surgeon will make a small cut (incision). If blue dye is to be injected, your surgeon will look for any lymph nodes that have picked up the dye.  Sentinel lymph nodes will be removed and sent to a lab for examination.  If no cancer is found, no other lymph nodes will be removed. This means it is unlikely the cancer has spread to other lymph nodes.  If cancer is found, the surgeon will remove other lymph nodes in the armpit for examination. This may happen during the same procedure or at a later time.  The incision will be closed with stitches or metal clips.  Small adhesive bandages may be used to keep the skin edges close together.  A small dressing may be taped over the incision area. AFTER THE PROCEDURE  You will be monitored in a recovery room.  Your urine may be blue for the next 24 hours. This is normal. It is caused by the dye used during the procedure.  Your skin at the injection site may be blue for up to 8 weeks.  You may feel numbness, tingling, or pain near your surgical cut (incision).  You may have swelling or bruising near your incision. Document Released: 11/02/2005 Document Revised: 03/19/2014 Document Reviewed: 08/29/2013 [facility] Patient Information 2015 [place], [place]. This information is not intended to replace advice given to you by your health care provider. Make  sure you discuss any questions you have with your health care provider. PATIENT INSTRUCTIONS POST-ANESTHESIA  IMMEDIATELY FOLLOWING SURGERY:  Do not drive or operate machinery for the first twenty four hours after surgery.  Do not make any important decisions for twenty four hours after surgery or while taking narcotic pain medications or sedatives.  If you develop intractable nausea and vomiting or a severe headache please notify your doctor immediately.  FOLLOW-UP:  Please make an appointment with your surgeon as instructed. You do not need to follow up with anesthesia unless specifically instructed to do so.  WOUND CARE INSTRUCTIONS (if applicable):  Keep a dry clean dressing on the anesthesia/puncture wound site if there is drainage.  Once the wound has quit draining you may leave it open to air.  Generally you should leave the bandage intact for twenty four hours unless there is drainage.  If the epidural site drains for more than 36-48 hours  please call the anesthesia department.  QUESTIONS?:  Please feel free to call your physician or the hospital operator if you have any questions, and they will be happy to assist you.

## 2015-07-26 ENCOUNTER — Encounter (HOSPITAL_COMMUNITY): Payer: Self-pay

## 2015-07-26 ENCOUNTER — Encounter (HOSPITAL_COMMUNITY)
Admission: RE | Admit: 2015-07-26 | Discharge: 2015-07-26 | Disposition: A | Discharge status: Home / Self Care | Payer: Medicare Other | Source: Ambulatory Visit | Attending: General Surgery | Admitting: General Surgery

## 2015-07-26 DIAGNOSIS — C50912 Malignant neoplasm of unspecified site of left female breast: Secondary | ICD-10-CM | POA: Insufficient documentation

## 2015-07-26 DIAGNOSIS — Z01818 Encounter for other preprocedural examination: Secondary | ICD-10-CM | POA: Diagnosis not present

## 2015-07-26 LAB — CBC WITH DIFFERENTIAL/PLATELET
BASOS PCT: 0 % (ref 0–1)
Basophils Absolute: 0 10*3/uL (ref 0.0–0.1)
Eosinophils Absolute: 0.1 10*3/uL (ref 0.0–0.7)
Eosinophils Relative: 1 % (ref 0–5)
HEMATOCRIT: 36.9 % (ref 36.0–46.0)
Hemoglobin: 11.5 g/dL — ABNORMAL LOW (ref 12.0–15.0)
Lymphocytes Relative: 28 % (ref 12–46)
Lymphs Abs: 2 10*3/uL (ref 0.7–4.0)
MCH: 25.8 pg — ABNORMAL LOW (ref 26.0–34.0)
MCHC: 31.2 g/dL (ref 30.0–36.0)
MCV: 82.7 fL (ref 78.0–100.0)
MONO ABS: 0.7 10*3/uL (ref 0.1–1.0)
MONOS PCT: 10 % (ref 3–12)
NEUTROS ABS: 4.3 10*3/uL (ref 1.7–7.7)
Neutrophils Relative %: 61 % (ref 43–77)
Platelets: 210 10*3/uL (ref 150–400)
RBC: 4.46 MIL/uL (ref 3.87–5.11)
RDW: 16.1 % — AB (ref 11.5–15.5)
WBC: 7.1 10*3/uL (ref 4.0–10.5)

## 2015-07-26 LAB — BASIC METABOLIC PANEL
ANION GAP: 9 (ref 5–15)
BUN: 20 mg/dL (ref 6–20)
CALCIUM: 9.4 mg/dL (ref 8.9–10.3)
CO2: 26 mmol/L (ref 22–32)
CREATININE: 0.98 mg/dL (ref 0.44–1.00)
Chloride: 105 mmol/L (ref 101–111)
GFR calc Af Amer: >60 mL/min (ref >60)
GFR calc non Af Amer: 57 mL/min — ABNORMAL LOW (ref >60)
GLUCOSE: 112 mg/dL — AB (ref 65–99)
Potassium: 3.9 mmol/L (ref 3.5–5.1)
Sodium: 140 mmol/L (ref 135–145)

## 2015-07-31 ENCOUNTER — Ambulatory Visit (HOSPITAL_COMMUNITY): Payer: Medicare Other | Admitting: Anesthesiology

## 2015-07-31 ENCOUNTER — Encounter (HOSPITAL_COMMUNITY)
Admission: RE | Disposition: A | Discharge status: Home / Self Care | Payer: Self-pay | Source: Ambulatory Visit | Attending: General Surgery

## 2015-07-31 ENCOUNTER — Ambulatory Visit (HOSPITAL_COMMUNITY)
Admission: RE | Admit: 2015-07-31 | Discharge: 2015-07-31 | Disposition: A | Discharge status: Home / Self Care | Payer: Medicare Other | Source: Ambulatory Visit | Attending: General Surgery | Admitting: General Surgery

## 2015-07-31 ENCOUNTER — Encounter (HOSPITAL_COMMUNITY): Payer: Self-pay | Admitting: *Deleted

## 2015-07-31 ENCOUNTER — Encounter (HOSPITAL_COMMUNITY): Payer: Self-pay

## 2015-07-31 DIAGNOSIS — K219 Gastro-esophageal reflux disease without esophagitis: Secondary | ICD-10-CM | POA: Diagnosis not present

## 2015-07-31 DIAGNOSIS — E119 Type 2 diabetes mellitus without complications: Secondary | ICD-10-CM | POA: Diagnosis not present

## 2015-07-31 DIAGNOSIS — E780 Pure hypercholesterolemia: Secondary | ICD-10-CM | POA: Insufficient documentation

## 2015-07-31 DIAGNOSIS — C50912 Malignant neoplasm of unspecified site of left female breast: Secondary | ICD-10-CM | POA: Diagnosis not present

## 2015-07-31 DIAGNOSIS — I10 Essential (primary) hypertension: Secondary | ICD-10-CM | POA: Insufficient documentation

## 2015-07-31 DIAGNOSIS — Z79899 Other long term (current) drug therapy: Secondary | ICD-10-CM | POA: Insufficient documentation

## 2015-07-31 DIAGNOSIS — Z87891 Personal history of nicotine dependence: Secondary | ICD-10-CM | POA: Insufficient documentation

## 2015-07-31 HISTORY — PX: AXILLARY SENTINEL NODE BIOPSY: SHX5738

## 2015-07-31 LAB — GLUCOSE, CAPILLARY
Glucose-Capillary: 107 mg/dL — ABNORMAL HIGH (ref 65–99)
Glucose-Capillary: 107 mg/dL — ABNORMAL HIGH (ref 65–99)

## 2015-07-31 SURGERY — BIOPSY, LYMPH NODE, SENTINEL, AXILLARY
Anesthesia: General | Site: Axilla | Laterality: Left

## 2015-07-31 MED ORDER — BUPIVACAINE HCL 0.5 % IJ SOLN: INTRAMUSCULAR | Status: DC | PRN

## 2015-07-31 MED ORDER — CEFAZOLIN SODIUM-DEXTROSE 2-3 GM-% IV SOLR
2.0000 g | INTRAVENOUS | Status: AC
  Administered 2015-07-31: 2 g via INTRAVENOUS

## 2015-07-31 MED ORDER — ONDANSETRON HCL 4 MG/2ML IJ SOLN: 4.0000 mg | Freq: Once | INTRAMUSCULAR | Status: DC | PRN

## 2015-07-31 MED ORDER — FENTANYL CITRATE (PF) 100 MCG/2ML IJ SOLN
INTRAMUSCULAR | Status: DC | PRN
  Administered 2015-07-31 (×3): 25 ug via INTRAVENOUS

## 2015-07-31 MED ORDER — PROPOFOL 10 MG/ML IV BOLUS
INTRAVENOUS | Status: AC
  Filled 2015-07-31: qty 20

## 2015-07-31 MED ORDER — MIDAZOLAM HCL 2 MG/2ML IJ SOLN
INTRAMUSCULAR | Status: AC
  Filled 2015-07-31: qty 2

## 2015-07-31 MED ORDER — SODIUM CHLORIDE 0.9 % IJ SOLN
INTRAMUSCULAR | Status: AC
  Filled 2015-07-31: qty 10

## 2015-07-31 MED ORDER — FENTANYL CITRATE (PF) 100 MCG/2ML IJ SOLN: 25.0000 ug | INTRAMUSCULAR | Status: DC | PRN

## 2015-07-31 MED ORDER — KETOROLAC TROMETHAMINE 30 MG/ML IJ SOLN
30.0000 mg | Freq: Once | INTRAMUSCULAR | Status: AC
  Administered 2015-07-31: 30 mg via INTRAVENOUS
  Filled 2015-07-31: qty 1

## 2015-07-31 MED ORDER — FENTANYL CITRATE (PF) 100 MCG/2ML IJ SOLN
25.0000 ug | INTRAMUSCULAR | Status: AC
  Administered 2015-07-31 (×2): 25 ug via INTRAVENOUS

## 2015-07-31 MED ORDER — TECHNETIUM TC 99M SULFUR COLLOID FILTERED
1.0000 | Freq: Once | INTRAVENOUS | Status: AC | PRN
  Administered 2015-07-31: 1 via INTRADERMAL

## 2015-07-31 MED ORDER — BUPIVACAINE HCL (PF) 0.5 % IJ SOLN
INTRAMUSCULAR | Status: AC
  Filled 2015-07-31: qty 30

## 2015-07-31 MED ORDER — METHYLENE BLUE 1 % INJ SOLN
INTRAMUSCULAR | Status: DC | PRN
  Administered 2015-07-31: 4 mL via INTRADERMAL

## 2015-07-31 MED ORDER — MIDAZOLAM HCL 2 MG/2ML IJ SOLN
1.0000 mg | INTRAMUSCULAR | Status: DC | PRN
  Administered 2015-07-31: 2 mg via INTRAVENOUS

## 2015-07-31 MED ORDER — PENTAFLUOROPROP-TETRAFLUOROETH EX AERO
INHALATION_SPRAY | CUTANEOUS | Status: AC
  Filled 2015-07-31: qty 103.5

## 2015-07-31 MED ORDER — ONDANSETRON HCL 4 MG/2ML IJ SOLN
INTRAMUSCULAR | Status: AC
  Filled 2015-07-31: qty 2

## 2015-07-31 MED ORDER — CEFAZOLIN SODIUM-DEXTROSE 2-3 GM-% IV SOLR
INTRAVENOUS | Status: AC
  Filled 2015-07-31: qty 50

## 2015-07-31 MED ORDER — LIDOCAINE HCL (PF) 1 % IJ SOLN
INTRAMUSCULAR | Status: AC
  Filled 2015-07-31: qty 5

## 2015-07-31 MED ORDER — ONDANSETRON HCL 4 MG/2ML IJ SOLN
4.0000 mg | Freq: Once | INTRAMUSCULAR | Status: AC
  Administered 2015-07-31: 4 mg via INTRAVENOUS

## 2015-07-31 MED ORDER — PROPOFOL 10 MG/ML IV BOLUS
INTRAVENOUS | Status: DC | PRN
  Administered 2015-07-31: 120 mg via INTRAVENOUS

## 2015-07-31 MED ORDER — METHYLENE BLUE 1 % INJ SOLN
INTRAMUSCULAR | Status: AC
  Filled 2015-07-31: qty 2

## 2015-07-31 MED ORDER — MIDAZOLAM HCL 2 MG/2ML IJ SOLN
INTRAMUSCULAR | Status: AC
  Filled 2015-07-31: qty 4

## 2015-07-31 MED ORDER — 0.9 % SODIUM CHLORIDE (POUR BTL) OPTIME
TOPICAL | Status: DC | PRN
  Administered 2015-07-31: 1000 mL

## 2015-07-31 MED ORDER — LACTATED RINGERS IV SOLN
INTRAVENOUS | Status: DC
  Administered 2015-07-31: 10:00:00 via INTRAVENOUS

## 2015-07-31 MED ORDER — CHLORHEXIDINE GLUCONATE 4 % EX LIQD: 1.0000 "application " | Freq: Once | CUTANEOUS | Status: DC

## 2015-07-31 MED ORDER — BUPIVACAINE HCL (PF) 0.5 % IJ SOLN
INTRAMUSCULAR | Status: DC | PRN
  Administered 2015-07-31: 6 mL

## 2015-07-31 MED ORDER — MIDAZOLAM HCL 5 MG/5ML IJ SOLN
INTRAMUSCULAR | Status: DC | PRN
  Administered 2015-07-31 (×2): 1 mg via INTRAVENOUS

## 2015-07-31 MED ORDER — LIDOCAINE HCL 1 % IJ SOLN
INTRAMUSCULAR | Status: DC | PRN
  Administered 2015-07-31: 35 mg via INTRADERMAL

## 2015-07-31 MED ORDER — FENTANYL CITRATE (PF) 100 MCG/2ML IJ SOLN
INTRAMUSCULAR | Status: AC
  Filled 2015-07-31: qty 2

## 2015-07-31 SURGICAL SUPPLY — 46 items
ADH SKN CLS APL DERMABOND .7 (GAUZE/BANDAGES/DRESSINGS) ×1
APPLIER CLIP 11 MED OPEN (CLIP)
APPLIER CLIP 9.375 SM OPEN (CLIP) ×3
APR CLP MED 11 20 MLT OPN (CLIP)
APR CLP SM 9.3 20 MLT OPN (CLIP) ×1
BAG HAMPER (MISCELLANEOUS) ×3 IMPLANT
BLADE 15 SAFETY STRL DISP (BLADE) ×1 IMPLANT
BNDG CMPR 82X61 PLY HI ABS (GAUZE/BANDAGES/DRESSINGS)
BNDG CONFORM 6X.82 1P STRL (GAUZE/BANDAGES/DRESSINGS) ×1 IMPLANT
CLIP APPLIE 11 MED OPEN (CLIP) IMPLANT
CLIP APPLIE 9.375 SM OPEN (CLIP) IMPLANT
CLOSURE WOUND 1/2 X4 (GAUZE/BANDAGES/DRESSINGS)
CLOTH BEACON ORANGE TIMEOUT ST (SAFETY) ×3 IMPLANT
COVER LIGHT HANDLE STERIS (MISCELLANEOUS) ×6 IMPLANT
DERMABOND ADVANCED (GAUZE/BANDAGES/DRESSINGS) ×2
DERMABOND ADVANCED .7 DNX12 (GAUZE/BANDAGES/DRESSINGS) IMPLANT
DURAPREP 26ML APPLICATOR (WOUND CARE) ×3 IMPLANT
ELECT REM PT RETURN 9FT ADLT (ELECTROSURGICAL) ×3
ELECTRODE REM PT RTRN 9FT ADLT (ELECTROSURGICAL) ×1 IMPLANT
EVACUATOR DRAINAGE 10X20 100CC (DRAIN) ×1 IMPLANT
EVACUATOR SILICONE 100CC (DRAIN)
FORMALIN 10 PREFIL 480ML (MISCELLANEOUS) ×1 IMPLANT
GAUZE SPONGE 4X4 12PLY STRL (GAUZE/BANDAGES/DRESSINGS) ×1 IMPLANT
GLOVE BIO SURGEON STRL SZ 6.5 (GLOVE) ×1 IMPLANT
GLOVE BIO SURGEONS STRL SZ 6.5 (GLOVE) ×1
GLOVE BIOGEL PI IND STRL 7.0 (GLOVE) IMPLANT
GLOVE BIOGEL PI IND STRL 7.5 (GLOVE) IMPLANT
GLOVE BIOGEL PI INDICATOR 7.0 (GLOVE) ×2
GLOVE BIOGEL PI INDICATOR 7.5 (GLOVE) ×2
GLOVE SURG SS PI 7.5 STRL IVOR (GLOVE) ×6 IMPLANT
GOWN STRL REUS W/TWL LRG LVL3 (GOWN DISPOSABLE) ×9 IMPLANT
INST SET MINOR GENERAL (KITS) ×3 IMPLANT
KIT ROOM TURNOVER APOR (KITS) ×3 IMPLANT
MANIFOLD NEPTUNE II (INSTRUMENTS) ×3 IMPLANT
NS IRRIG 1000ML POUR BTL (IV SOLUTION) ×3 IMPLANT
PACK MINOR (CUSTOM PROCEDURE TRAY) ×3 IMPLANT
PAD ARMBOARD 7.5X6 YLW CONV (MISCELLANEOUS) ×3 IMPLANT
SET BASIN LINEN APH (SET/KITS/TRAYS/PACK) ×3 IMPLANT
SPONGE INTESTINAL PEANUT (DISPOSABLE) ×1 IMPLANT
SPONGE LAP 18X18 X RAY DECT (DISPOSABLE) ×3 IMPLANT
STOCKINETTE IMPERVIOUS LG (DRAPES) ×1 IMPLANT
STRIP CLOSURE SKIN 1/2X4 (GAUZE/BANDAGES/DRESSINGS) ×1 IMPLANT
SUT ETHILON 3 0 FSL (SUTURE) ×1 IMPLANT
SUT VIC AB 3-0 SH 27 (SUTURE) ×3
SUT VIC AB 3-0 SH 27X BRD (SUTURE) ×1 IMPLANT
SUT VIC AB 4-0 PS2 27 (SUTURE) ×3 IMPLANT

## 2015-07-31 NOTE — OR Nursing (Signed)
Nuc Med  Notified that patient is here and ready for sentinel node injection

## 2015-07-31 NOTE — Anesthesia Procedure Notes (Signed)
Procedure Name: LMA Insertion Date/Time: 07/31/2015 11:26 AM Performed by: Charmaine Downs Pre-anesthesia Checklist: Emergency Drugs available, Patient identified, Suction available and Patient being monitored Patient Re-evaluated:Patient Re-evaluated prior to inductionOxygen Delivery Method: Circle system utilized Preoxygenation: Pre-oxygenation with 100% oxygen Intubation Type: IV induction Ventilation: Mask ventilation without difficulty LMA: LMA inserted LMA Size: 4.0 Grade View: Grade II Tube type: Oral Number of attempts: 1 Placement Confirmation: positive ETCO2 and breath sounds checked- equal and bilateral Tube secured with: Tape Dental Injury: Teeth and Oropharynx as per pre-operative assessment

## 2015-07-31 NOTE — Interval H&P Note (Signed)
History and Physical Interval Note:  07/31/2015 10:25 AM  [name]  has presented today for surgery, with the diagnosis of left breast cancer  The various methods of treatment have been discussed with the patient and family. After consideration of risks, benefits and other options for treatment, the patient has consented to  Procedure(s) with comments: [place] (Left) - Sentinel Node @ 1000 as a surgical intervention .  The patient's history has been reviewed, patient examined, no change in status, stable for surgery.  I have reviewed the patient's chart and labs.  Questions were answered to the patient's satisfaction.     Aviva Signs A

## 2015-07-31 NOTE — Op Note (Signed)
Patient:  [name]  DOB:  Jan 21, 1946  MRN:  [id number]   Preop Diagnosis:  Invasive ductal carcinoma, left breast  Postop Diagnosis:  Same  Procedure:  Left axillary sentinel lymph node biopsy  Surgeon:  Aviva Signs, M.D.  Anes:  Gen.  Indications:  Patient is a 69 year old white female who was recently found to have invasive ductal carcinoma in the left breast. The patient now comes the operating room for left axillary sentinel lymph node biopsy, possible axillary dissection for staging purposes. The risks and benefits of the procedure including bleeding, infection, nerve injury, and left arm swelling were fully explained to the patient, who gave informed consent.  Procedure note:  Patient was placed in the supine position. Patient had been previously injected with radioactive nuclide by radiology. After general anesthesia was administered, diluted methylene blue was then instilled subdermally in the area of the previous partial mastectomy and massaged for 5 minutes. Left breast and axilla were then prepped and draped using usual sterile technique with DuraPrep. Surgical site confirmation was performed.  The neoprobe, an incision was made into the left axilla down to the sentinel lymph node which was blue and had a high count of 4800. This was removed and any bleeding controlled using small clips. The specimen was sent for frozen section, which was negative for malignancy. The basin counts were less than 10% of the in vivo count. The wound was irrigated normal saline. The subcutaneous layer was reapproximated using a 3-0 Vicryl interrupted suture. 0.5% Sensorcaine was instilled the surrounding wound. The skin was closed using a 4-0 Vicryl subcuticular suture. Dermabond was applied.  All tape and needle counts were correct at the end of the procedure. Patient was awakened and transferred to PACU in stable condition.  Complications:  None  EBL:  Minimal  Specimen:  Left axillary lymph  node

## 2015-07-31 NOTE — OR Nursing (Signed)
Sentinel  Node injection done

## 2015-07-31 NOTE — Transfer of Care (Signed)
Immediate Anesthesia Transfer of Care Note  Patient: [name]  Procedure(s) Performed: Procedure(s) with comments: SENTINEL LYMPH NODE BIOPSY, LEFT AXILLA,  (Left) - Sentinel Node @ 1000  Patient Location: PACU  Anesthesia Type:General  Level of Consciousness: awake, alert  and patient cooperative  Airway & Oxygen Therapy: Patient Spontanous Breathing and Patient connected to face mask oxygen  Post-op Assessment: Report given to RN, Post -op Vital signs reviewed and stable and Patient moving all extremities  Post vital signs: Reviewed and stable  Last Vitals:  Filed Vitals:   07/31/15 1110  BP: 140/70  Pulse: 80  Temp:   Resp: 20    Complications: No apparent anesthesia complications

## 2015-07-31 NOTE — Anesthesia Postprocedure Evaluation (Signed)
  Anesthesia Post-op Note  Patient: [name]  Procedure(s) Performed: Procedure(s) with comments: SENTINEL LYMPH NODE BIOPSY, LEFT AXILLA,  (Left) - Sentinel Node @ 1000  Patient Location: PACU  Anesthesia Type:General  Level of Consciousness: awake, alert , oriented and patient cooperative  Airway and Oxygen Therapy: Patient Spontanous Breathing  Post-op Pain: 2 /10, mild  Post-op Assessment: Post-op Vital signs reviewed, Patient's Cardiovascular Status Stable, Respiratory Function Stable, Patent Airway, No signs of Nausea or vomiting and Pain level controlled              Post-op Vital Signs: Reviewed and stable  Last Vitals:  Filed Vitals:   07/31/15 1110  BP: 140/70  Pulse: 80  Temp:   Resp: 20    Complications: No apparent anesthesia complications

## 2015-07-31 NOTE — Discharge Instructions (Signed)
Sentinel Lymph Node Biopsy in Breast Cancer Treatment, Care After Refer to this sheet in the next few weeks. These instructions provide you with information on caring for yourself after your procedure. Your health care provider may also give you more specific instructions. Your treatment has been planned according to current medical practices, but problems sometimes occur. Call your health care provider if you have any problems or questions after your procedure.  WHAT TO EXPECT AFTER THE PROCEDURE After your procedure, it is typical to have the following:  Your urine may be blue for the next 24 hours. This is normal. It is caused by the dye used during the procedure.  Your skin at the injection site may be blue for up to 8 weeks.  You may feel numbness, tingling, or pain near your surgical cut (incision).  You may have swelling or bruising near your incision. HOME CARE INSTRUCTIONS  Avoid vigorous exercise. Ask your health care provider when you can return to your normal activities.  You may shower 24 hours after your procedure. It is okay to get your incision wet. Gently pat the incision dry after you shower.  If you are given a surgical bra, wear it for the next 48 hours. You may remove the bra to shower.  Do not remove skin adhesive strips over your incision. They will fall off on their own over time.  Take medicines only as directed by your health care provider.  You may resume your regular diet.  Do not have your blood pressure taken on the side you had the biopsy until your health care provider says it is okay.  Keep all follow-up visits as directed by your health care provider. SEEK MEDICAL CARE IF:  Your pain medicine is not helping.  You have nausea and vomiting.  You have any new swelling, bruising, or redness.  You have chills or fever. SEEK IMMEDIATE MEDICAL CARE IF:  You have pain that is getting worse, and your medicine is not helping.  You have redness,  swelling, or tenderness that is getting worse.  You have bleeding or drainage from your incision site.  You have vomiting that will not stop.  You have chest pain or trouble breathing. Document Released: 11/07/2013 Document Revised: 03/19/2014 Document Reviewed: 11/07/2013 [facility] Patient Information 2015 [place], [place]. This information is not intended to replace advice given to you by your health care provider. Make sure you discuss any questions you have with your health care provider.

## 2015-07-31 NOTE — Anesthesia Preprocedure Evaluation (Signed)
Anesthesia Evaluation  Patient identified by MRN, date of birth, ID band Patient awake    Reviewed: Allergy & Precautions, H&P , NPO status , Patient's Chart, lab work & pertinent test results  Airway Mallampati: II  TM Distance: >3 FB Neck ROM: full    Dental  (+) Edentulous Upper, Edentulous Lower   Pulmonary neg pulmonary ROS, former smoker,  breath sounds clear to auscultation        Cardiovascular hypertension, Pt. on medications Rhythm:Regular Rate:Normal     Neuro/Psych  Neuromuscular disease    GI/Hepatic hiatal hernia, GERD-  Medicated and Controlled,(+) Hepatitis -, AHepatitis A as a child.   Endo/Other  diabetes, Type 2, Oral Hypoglycemic Agents  Renal/GU      Musculoskeletal  (+) Arthritis -,   Abdominal   Peds  Hematology   Anesthesia Other Findings   Reproductive/Obstetrics                             Anesthesia Physical Anesthesia Plan  ASA: III  Anesthesia Plan: General   Post-op Pain Management:    Induction: Intravenous  Airway Management Planned: LMA  Additional Equipment:   Intra-op Plan:   Post-operative Plan: Extubation in OR  Informed Consent: I have reviewed the patients History and Physical, chart, labs and discussed the procedure including the risks, benefits and alternatives for the proposed anesthesia with the patient or authorized representative who has indicated his/her understanding and acceptance.     Plan Discussed with:   Anesthesia Plan Comments:         Anesthesia Quick Evaluation  

## 2015-08-01 ENCOUNTER — Encounter (HOSPITAL_COMMUNITY): Payer: Self-pay | Admitting: General Surgery

## 2015-08-02 ENCOUNTER — Encounter (HOSPITAL_COMMUNITY): Payer: Self-pay | Admitting: *Deleted

## 2015-08-02 NOTE — Progress Notes (Signed)
Oncotype sent to [facility] this morning. They stated that they received it at [facility].

## 2015-08-04 ENCOUNTER — Encounter (HOSPITAL_COMMUNITY): Payer: Self-pay | Admitting: Hematology & Oncology

## 2015-08-09 ENCOUNTER — Encounter (HOSPITAL_COMMUNITY): Payer: Self-pay | Admitting: *Deleted

## 2015-08-09 NOTE — Progress Notes (Signed)
Oncotype results will determine whether this patient will benefit from chemotherapy or not.

## 2015-08-16 ENCOUNTER — Encounter (HOSPITAL_COMMUNITY): Payer: Self-pay

## 2015-08-16 ENCOUNTER — Encounter (HOSPITAL_COMMUNITY): Payer: Self-pay | Admitting: Lab

## 2015-08-16 ENCOUNTER — Encounter (HOSPITAL_COMMUNITY): Payer: Self-pay | Admitting: Hematology & Oncology

## 2015-08-16 ENCOUNTER — Encounter (HOSPITAL_COMMUNITY): Payer: Medicare Other | Attending: Hematology & Oncology | Admitting: Hematology & Oncology

## 2015-08-16 VITALS — BP 135/63 | HR 84 | Temp 97.5°F | Resp 16 | Wt 191.4 lb

## 2015-08-16 DIAGNOSIS — C50919 Malignant neoplasm of unspecified site of unspecified female breast: Secondary | ICD-10-CM | POA: Insufficient documentation

## 2015-08-16 DIAGNOSIS — Z79899 Other long term (current) drug therapy: Secondary | ICD-10-CM | POA: Insufficient documentation

## 2015-08-16 DIAGNOSIS — Z170 Estrogen receptor positive status [ER+]: Secondary | ICD-10-CM

## 2015-08-16 DIAGNOSIS — C50912 Malignant neoplasm of unspecified site of left female breast: Secondary | ICD-10-CM | POA: Insufficient documentation

## 2015-08-16 NOTE — Progress Notes (Signed)
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 Arnoldo Morale on 06/26/2015, no sentinel node performed Sentinel node procedure on 08/01/2015 negative sentinel node. ONCOTYPE DX assay with recurrence score result of 5, Low risk, 10 year risk of distance recurrence tamoxifen alone 5%   HISTORY OF PRESENTING ILLNESS:  [name] 69 y.o. 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 Her original biopsy was performed on 05/02/2015 showing DCIS. Unfortunately after her definitive surgery, invasive disease was noted. Total tumor dimension was 2 cm.  Margins were good with the closest margin being approximately 1 mm. She had to go back and have a sentinel node. Since her original biopsy showed only DCIS it was not anticipated she would have invasive disease.  The patient presents today for additional discussion. She describes her mood is good. She states she has a positive outlook.  She states that she has healed well.  Her Primary is Dr. Sharmaine Base.  She is up to date on all of her screenings. She has no other concerns at this time.    Ms. Smaldone is here alone today.  She says "I thank you so much. I just can't thank the doctors and nurses enough for being so nice to me."  She is here to review her oncotype  results.  MEDICAL HISTORY:  Past Medical History  Diagnosis Date  . Essential hypertension, benign   . Diabetes mellitus   . Other and unspecified hyperlipidemia   . Nonspecific abnormal finding in stool contents 01/11/2012  . GERD (gastroesophageal reflux disease)   . Arthritis   . Kidney stones   . Hiatal hernia   . Breast cancer 06/26/15    left  . Hepatitis     Hep A as a child  . Disorder of bone and cartilage, unspecified     SURGICAL HISTORY: Past Surgical History  Procedure Laterality Date  . Cataract extraction    . Foot surgery    . Abdominal hysterectomy    . Lithotripsy    . Colonoscopy w/ biopsies and polypectomy    . Open reduction internal fixation (orif) distal radial fracture Left 11/02/2014    Procedure: OPEN REDUCTION INTERNAL FIXATION (ORIF) DISTAL RADIAL FRACTURE;  Surgeon: Charlotte Crumb, MD;  Location: [facility];  Service: Orthopedics;  Laterality: Left;  . Carpal tunnel release Left 11/02/2014    Procedure: CARPAL TUNNEL RELEASE;  Surgeon: Charlotte Crumb, MD;  Location: [facility];  Service: Orthopedics;  Laterality: Left;  . Partial mastectomy with needle localization Left 06/26/2015    Procedure: PARTIAL MASTECTOMY WITH NEEDLE LOCALIZATION;  Surgeon: Aviva Signs, MD;  Location: [facility];  Service: General;  Laterality: Left;  Needle Loc @ 8:00am  . Axillary sentinel node biopsy Left 07/31/2015    Procedure: SENTINEL LYMPH NODE BIOPSY, LEFT AXILLA, ;  Surgeon: Aviva Signs, MD;  Location: [facility];  Service: General;  Laterality:  Left;  Sentinel Node @ 1000    SOCIAL HISTORY: Social History   Social History  . Marital Status: Married    Spouse Name: N/A  . Number of Children: 2  . Years of Education: N/A   Occupational History  . Retired    Social History Main Topics  . Smoking status: Former Research scientist (life sciences)  . Smokeless tobacco: Never Used  . Alcohol Use: No  . Drug Use: No  . Sexual Activity: Not on file   Other Topics Concern  . Not on file   Social  History Narrative  Married, 44 years 2 children Formerly employed in [employer] for 29 years Hobbies include playing [employer] Ex-smoker, Began at age 49, Quit many years ago ETOH, none   FAMILY HISTORY: Family History  Problem Relation Age of Onset  . Hypertension Mother   . Osteoporosis Mother   . Diabetes Sister     x3  . Colon cancer Neg Hx    has no family status information on file.   ALLERGIES:  has No Known Allergies.   Mother deceased, 18, old age, she had a removal small cancer in lining of stomach Father deceased, 84, car accident 4 sisters. 2 deceased from COPD, heart attacks, age 25; bed ridden with diabetes, COPD, age 93   MEDICATIONS:  Current Outpatient Prescriptions  Medication Sig Dispense Refill  . alendronate (FOSAMAX) 70 MG tablet Take 70 mg by mouth every 7 (seven) days. Take with a full glass of water on an empty stomach.    . Cholecalciferol (VITAMIN D-3 PO) Take 1 tablet by mouth daily.    Marland Kitchen losartan-hydrochlorothiazide (HYZAAR) 50-12.5 MG per tablet Take 1 tablet by mouth daily.    . metFORMIN (GLUCOPHAGE) 1000 MG tablet Take 1,000 mg by mouth 2 (two) times daily with a meal.    . Multiple Vitamins-Calcium (ONE-A-DAY WOMENS PO) Take 1 tablet by mouth daily.    . Omega-3 Fatty Acids (FISH OIL PO) Take 1 capsule by mouth 2 (two) times daily.     Marland Kitchen omeprazole (PRILOSEC) 20 MG capsule Take 20 mg by mouth daily.    . simvastatin (ZOCOR) 20 MG tablet Take 20 mg by mouth daily.    Marland Kitchen oxyCODONE-acetaminophen (ROXICET) 5-325 MG per tablet Take 1 tablet by mouth every 4 (four) hours as needed for severe pain. 40 tablet 0   No current facility-administered medications for this visit.    Review of Systems  Constitutional: Negative for fever, chills, weight loss and malaise/fatigue.  HENT: Negative for congestion, hearing loss, nosebleeds, sore throat and tinnitus.   Eyes: Negative for blurred vision, double vision, pain and discharge.  Respiratory:  Negative for cough, hemoptysis, sputum production, shortness of breath and wheezing.   Cardiovascular: Negative for chest pain, palpitations, claudication, leg swelling and PND.  Gastrointestinal: Negative for heartburn, nausea, vomiting, abdominal pain, diarrhea, constipation, blood in stool and melena.  Genitourinary: Negative for dysuria, urgency, frequency and hematuria.  Musculoskeletal: Negative for myalgias, joint pain and falls.  Skin: Negative for itching and rash.  Neurological: Negative for dizziness, tingling, tremors, sensory change, speech change, focal weakness, seizures, loss of consciousness, weakness and headaches.  Endo/Heme/Allergies: Does not bruise/bleed easily.  Psychiatric/Behavioral: Negative for depression, suicidal ideas, memory loss and substance abuse. The patient is not nervous/anxious and does not have insomnia.    14 point ROS was done and is otherwise as detailed above or in HPI   PHYSICAL EXAMINATION: ECOG PERFORMANCE STATUS: 0 - Asymptomatic  Filed Vitals:  08/16/15 1109  BP: 135/63  Pulse: 84  Temp: 97.5 F (36.4 C)  Resp: 16   Filed Weights   08/16/15 1109  Weight: 191 lb 6.4 oz (86.818 kg)   Physical Exam  Constitutional: She is oriented to person, place, and time and well-developed, well-nourished, and in no distress.  HENT:  Head: Normocephalic and atraumatic.  Nose: Nose normal.  Mouth/Throat: Oropharynx is clear and moist. No oropharyngeal exudate.  Eyes: Conjunctivae and EOM are normal. Pupils are equal, round, and reactive to light. Right eye exhibits no discharge. Left eye exhibits no discharge. No scleral icterus.  Neck: Normal range of motion. Neck supple. No tracheal deviation present. No thyromegaly present.  Cardiovascular: Normal rate, regular rhythm and normal heart sounds.  Exam reveals no gallop and no friction rub.   No murmur heard. Pulmonary/Chest: Effort normal and breath sounds normal. She has no wheezes. She has no  rales.  Abdominal: Soft. Bowel sounds are normal. She exhibits no distension and no mass. There is no tenderness. There is no rebound and no guarding.  Musculoskeletal: Normal range of motion. She exhibits no edema.  Lymphadenopathy:    She has no cervical adenopathy.  Neurological: She is alert and oriented to person, place, and time. She has normal reflexes. No cranial nerve deficit. Gait normal. Coordination normal.  Skin: Skin is warm and dry. No rash noted.  Psychiatric: Mood, memory, affect and judgment normal.  Nursing note and vitals reviewed.  left breast is examined with a well-healed surgical incision site from the approximate 8:00 to 4:00 position.   LABORATORY DATA:  I have reviewed the data as listed  CBC    Component Value Date/Time   WBC 7.1 07/26/2015 1505   RBC 4.46 07/26/2015 1505   HGB 11.5* 07/26/2015 1505   HCT 36.9 07/26/2015 1505   PLT 210 07/26/2015 1505   MCV 82.7 07/26/2015 1505   MCH 25.8* 07/26/2015 1505   MCHC 31.2 07/26/2015 1505   RDW 16.1* 07/26/2015 1505   LYMPHSABS 2.0 07/26/2015 1505   MONOABS 0.7 07/26/2015 1505   EOSABS 0.1 07/26/2015 1505   BASOSABS 0.0 07/26/2015 1505   CMP     Component Value Date/Time   NA 140 07/26/2015 1505   K 3.9 07/26/2015 1505   CL 105 07/26/2015 1505   CO2 26 07/26/2015 1505   GLUCOSE 112* 07/26/2015 1505   BUN 20 07/26/2015 1505   CREATININE 0.98 07/26/2015 1505   CALCIUM 9.4 07/26/2015 1505   PROT 6.7 06/17/2015 1445   ALBUMIN 4.0 06/17/2015 1445   AST 28 06/17/2015 1445   ALT 27 06/17/2015 1445   ALKPHOS 76 06/17/2015 1445   BILITOT 0.3 06/17/2015 1445   GFRNONAA 57* 07/26/2015 1505   GFRAA >60 07/26/2015 1505    RADIOGRAPHIC STUDIES: I have personally reviewed the radiological images as listed and agreed with the findings in the report.  CLINICAL DATA: Post clip mammograms following stereotactic core needle biopsy of left breast calcifications and associated linear/branching soft tissue  opacity.  EXAM: DIAGNOSTIC LEFT MAMMOGRAM POST ULTRASOUND BIOPSY  COMPARISON: Previous exam(s).  FINDINGS: Mammographic images were obtained following stereotactic guided biopsy of left breast calcifications. X shaped biopsy clip lies within the abnormal soft tissue and adjacent to residual calcifications in the lower inner left breast.  IMPRESSION: Well-positioned X shaped biopsy clip following stereotactic core needle biopsy.  Final Assessment: Post Procedure Mammograms for Marker Placement   Electronically Signed  By: Lajean Manes M.D.  On: 05/02/2015 11:43  CLINICAL  DATA: Recent diagnosis of breast cancer. Preop for lumpectomy. Slight productive cough.  EXAM: CHEST 2 VIEW  COMPARISON: Chest radiograph 10/17/2004.  FINDINGS: The heart size and mediastinal contours are within normal limits. Both lungs are clear. The visualized skeletal structures are unremarkable. Stable slight eventration or elevation RIGHT hemidiaphragm. Thoracic atherosclerosis.  IMPRESSION: No active cardiopulmonary disease.   Electronically Signed  By: Staci Righter M.D.  On: 06/17/2015 15:16 REPORT OF SURGICAL PATHOLOGY ADDITIONAL INFORMATION: PROGNOSTIC INDICATORS Results: IMMUNOHISTOCHEMICAL AND MORPHOMETRIC ANALYSIS PERFORMED MANUALLY Estrogen Receptor: 100%, POSITIVE, STRONG STAINING INTENSITY Progesterone Receptor: 60%, POSITIVE, STRONG STAINING INTENSITY Proliferation Marker Ki67: 25% REFERENCE RANGE ESTROGEN RECEPTOR NEGATIVE 0% POSITIVE =>1% REFERENCE RANGE PROGESTERONE RECEPTOR NEGATIVE 0% POSITIVE =>1% All controls stained appropriately Enid Cutter MD Pathologist, Electronic Signature ( Signed 07/02/2015) FLUORESCENCE IN-SITU HYBRIDIZATION Results: HER2 - NEGATIVE RATIO OF HER2/CEP17 SIGNALS 1.44 AVERAGE HER2 COPY NUMBER PER CELL 1.95 Reference Range: NEGATIVE HER2/CEP17 Ratio <2.0 and average HER2 copy number <4.0 EQUIVOCAL HER2/CEP17  Ratio <2.0 and average HER2 copy number >=4.0 and <6.0 1 of 4 FINAL for MAELIN, KURKOWSKI H ([id number]) ADDITIONAL INFORMATION:(continued) POSITIVE HER2/CEP17 Ratio >=2.0 or <2.0 and average HER2 copy number >=6.0 Enid Cutter MD Pathologist, Electronic Signature ( Signed 07/02/2015) FINAL DIAGNOSIS Diagnosis Breast, lumpectomy, left - INVASIVE DUCTAL CARCINOMA, 2 CM. - HIGH GRADE DUCTAL CARCINOMA IN SITU. - INVASIVE CARCINOMA FOCALLY LESS THAN 0.1 CM FROM LATERAL MARGIN. - DUCTAL CARCINOMA IN SITU FOCALLY LESS THAN 0.1 CM FROM THE LATERAL MARGIN. Microscopic Comment BREAST, INVASIVE TUMOR, WITHOUT LYMPH NODES PRESENT Specimen, including laterality : Left breast. Procedure: Lumpectomy. Histologic type: Ductal. Grade: 2 Tubule formation: 3 Nuclear pleomorphism: 2 Mitotic: 2 Tumor size (gross measurement): 2 cm Margins: Free of tumor. Invasive, distance to closest margin: Less than 0.1 cm from the lateral margin. In-situ, distance to closest margin: Less than 0.1 cm from lateral margin. If margin positive, focally or broadly: N/A Lymphovascular invasion: Not identified. Ductal carcinoma in situ: Present. Grade: High grade. Extensive intraductal component: Yes Lobular neoplasia: No Tumor focality: Unifocal. Treatment effect: No If present, treatment effect in breast tissue, lymph nodes or both: N/A Extent of tumor: Skin: N/A Nipple: N/A Skeletal muscle: N/A Breast prognostic profile: Pending. Estrogen receptor: Pending. Progesterone receptor: Pending. Her 2 neu: Pending. Ki-67: Pending. Non-neoplastic breast: Fibrocystic changes. 2 of 4 FINAL for SUNDRA, HADDIX [phone]) Microscopic Comment(continued) TNM: pT1c, pNX, pMX Comments: There is extensive high grade ductal carcinoma in situ associated with moderately differentiated invasive ductal carcinoma. There are multiple foci where the tumor is less than 0.1 cm from the lateral margin. The remaining margins are 1 cm  and greater. A breast prognostic profile is pending and will be reported as an addendum. (JDP:ecj 06/28/2015) Claudette Laws MD Pathologist, Electronic Signature (Case signed 06/28/2015) Specimen Gross and Clinical Information Specimen(s) Obtained: Breast, lumpectomy, left Specimen Clinical Information left breast cancer Gross Specimen type: Received in formalin and placed in formalin at 10:50 a.m. on 06/26/15 is a left breast lumpectomy specimen. Size: 8.0 from superior to inferior x 7.0 from anterior to posterior x 1.5 cm from medial to lateral. Orientation: The specimen has a short suture designating superior and a long suture designating lateral. The specimen is inked as follows: green-anterior, blue-inferior, orange-lateral, yellow-medial, black-posterior, and red-superior. Localized area: A needle localization wire is inserted at the 8 o'clock position, which inserts superiorly and exits medially. Cut surface: Sectioning reveals a 2.0 x 1.8 x 1.0 cm firm gray-white ill-defined, focally cystic lesion within which an X-shaped biopsy  clip is identified. The remaining parenchyma is 99% yellow lobulated adipose tissue with minimal dense white fibrous tissue. Margins: The lesion abuts the lateral margin and is located 1.0 cm from the medial and posterior margins, 3.0 cm from the anterior margin, and far from the superior and inferior margin. Prognostic indicators: Not taken at time of gross. Block summary: The lesion is entirely submitted as follows: A = slice associated with biopsy clip to lateral margin B = representative lesion without margins C-E = representative lesion with lateral margin F = representative posterior, medial, and anterior margin G = representative superior and inferior margins H = representative uninvolved fibrous tissue Eight blocks total (KF:ds 06/27/15) Stain(s) used in Diagnosis: The following stain(s) were used in diagnosing the case: Her2 FISH, ER-ACIS,  PR-ACIS, KI-67-ACIS. The control(s) stained appropriately. Disclaimer HER2 IQFISH pharmDX (code [id number]) is a direct fluorescence in-situ hybridization assay designed to quantitatively determine HER2 gene amplification in formalin-fixed, paraffin-embedded tissue specimens. It is performed at [facility] and is reported using ASCO/CAP scoring criteria published in 2013. PR progesterone receptor (PgR 636), immunohistochemical stains are performed on formalin fixed, paraffin embedded tissue using a 3,3"-diaminobenzidine (DAB) chromogen and [employer] Autostainer System. The staining intensity of the nucleus is scored morphometrically using the Automated Cellular Imaging System (ACIS) and is reported as the percentage of tumor cell nuclei demonstrating specific nuclear staining. 3 of 4 FINAL for KAMAYA, KECKLER ([id number]) Disclaimer(continued) Estrogen receptor (SP1), immunohistochemical stains are performed on formalin fixed, paraffin embedded tissue using a 3,3"-diaminobenzidine (DAB) chromogen and [employer] Autostainer System. The staining intensity of the nucleus is scored morphometrically using the Automated Cellular Imaging System (ACIS) and is reported as the percentage of tumor cell nuclei demonstrating specific nuclear staining. Ki-67 (Mib-1), immunohistochemical stains are performed on formalin fixed, paraffin embedded tissue using a 3,3"-diaminobenzidine (DAB) chromogen and [place]. The staining intensity of the nucleus is scored morphometrically using the Automated Cellular Imaging System (ACIS) and is reported as the percentage of tumor cell nuclei demonstrating specific nuclear staining. Report signed out from the following location(s) Technical component and interpretation was performed at [place], [place], [place] [address]. CLIA #: [id number], 4 of 4   ASSESSMENT & PLAN:  T1c, Nx, Mx ER+ (100%), PR + (60%), HER 2 neu negative  carcinoma of the L breast Digital screening mammogram on 04/18/2015 showing calcifications in the left breast Diagnostic digital left mammogram on 04/22/2015 BI-RADS Category 4 with indeterminate left breast calcification, 1 x 4.2 x 1.2 cm developing group of heterogeneous calcifications Left breast stereotactic core needle biopsy on 05/02/2015 with final pathology DCIS, ER positive, PR positive Breast partial mastectomy after needle localization with Dr. Arnoldo Morale on 06/26/2015, no sentinel node performed Sentinel node procedure on 08/01/2015 negative sentinel node. ONCOTYPE DX assay with recurrence score result of 5, Low risk, 10 year risk of distance recurrence tamoxifen alone 5%  I reviewed the patient's Oncotype in detail. She thousand to the low risk category and I advised her that there is little benefit from systemic chemotherapy. We will refer her for radiation therapy, she would like to go [facility]. I will anticipate seeing her back in approximately 6 weeks' time to discuss endocrine therapy in detail. We will arrange for a bone density prior to her next follow-up. I reviewed the progression of treatment moving forward, radiation, followed by endocrine therapy. She has a good understanding and wishes to proceed.  I will sign her insurance documentation, and we will call  her when everything is finished.  Orders Placed This Encounter  Procedures  . DG Bone Density    Standing Status: Future     Number of Occurrences:      Standing Expiration Date: 08/15/2016    Order Specific Question:  Reason for Exam (SYMPTOM  OR DIAGNOSIS REQUIRED)    Answer:  high risk medication    Order Specific Question:  Preferred imaging location?    Answer:  [facility]    All questions were answered. The patient knows to call the clinic with any problems, questions or concerns.   This document serves as a record of services personally performed by Ancil Linsey, MD. It was created on her  behalf by Toni Amend, a trained medical scribe. The creation of this record is based on the scribe's personal observations and the provider's statements to them. This document has been checked and approved by the attending provider.  I have reviewed the above documentation for accuracy and completeness, and I agree with the above.  This note was electronically signed.  Kelby Fam. Whitney Muse, MD

## 2015-08-16 NOTE — Patient Instructions (Signed)
[place] at [facility] Discharge Instructions  RECOMMENDATIONS MADE BY THE CONSULTANT AND ANY TEST RESULTS WILL BE SENT TO YOUR REFERRING PHYSICIAN.  Exam and discussion by Dr. Whitney Muse Will make referral to [facility] for Radiation consult.  They will contact you with the date and time of your appointment. Will get Bone Density Study Report any new lumps, bone pain, shortness of breath or other symptoms.  Follow-up with Dr. Whitney Muse in 2 months.  Thank you for choosing [place] at [facility] to provide your oncology and hematology care.  To afford each patient quality time with our provider, please arrive at least 15 minutes before your scheduled appointment time.    You need to re-schedule your appointment should you arrive 10 or more minutes late.  We strive to give you quality time with our providers, and arriving late affects you and other patients whose appointments are after yours.  Also, if you no show three or more times for appointments you may be dismissed from the clinic at the providers discretion.     Again, thank you for choosing [facility].  Our hope is that these requests will decrease the amount of time that you wait before being seen by our physicians.       _____________________________________________________________  Should you have questions after your visit to [facility], please contact our office at [phone] between the hours of 8:30 a.m. and 4:30 p.m.  Voicemails left after 4:30 p.m. will not be returned until the following business day.  For prescription refill requests, have your pharmacy contact our office.

## 2015-08-16 NOTE — Progress Notes (Signed)
Referral to [facility].  Records faxed on 9/29.  They will contact patient.

## 2015-08-19 ENCOUNTER — Ambulatory Visit (HOSPITAL_COMMUNITY): Payer: Medicare Other | Admitting: Hematology & Oncology

## 2015-08-20 NOTE — Progress Notes (Signed)
This encounter was created in error - please disregard.

## 2015-08-26 ENCOUNTER — Ambulatory Visit (HOSPITAL_COMMUNITY)
Admission: RE | Admit: 2015-08-26 | Discharge: 2015-08-26 | Disposition: A | Discharge status: Home / Self Care | Payer: Medicare Other | Source: Ambulatory Visit | Attending: Hematology & Oncology | Admitting: Hematology & Oncology

## 2015-08-26 DIAGNOSIS — C50912 Malignant neoplasm of unspecified site of left female breast: Secondary | ICD-10-CM | POA: Diagnosis not present

## 2015-08-26 DIAGNOSIS — Z79899 Other long term (current) drug therapy: Secondary | ICD-10-CM | POA: Insufficient documentation

## 2015-08-27 ENCOUNTER — Other Ambulatory Visit: Payer: Self-pay | Admitting: Radiation Oncology

## 2015-08-27 DIAGNOSIS — Z853 Personal history of malignant neoplasm of breast: Secondary | ICD-10-CM

## 2015-08-30 ENCOUNTER — Other Ambulatory Visit: Payer: Self-pay

## 2015-08-30 ENCOUNTER — Other Ambulatory Visit: Payer: Self-pay | Admitting: Radiation Oncology

## 2015-08-30 DIAGNOSIS — Z853 Personal history of malignant neoplasm of breast: Secondary | ICD-10-CM

## 2015-09-27 ENCOUNTER — Telehealth (HOSPITAL_COMMUNITY): Payer: Self-pay | Admitting: Hematology & Oncology

## 2015-09-27 NOTE — Telephone Encounter (Signed)
Faxed med recs to [employer] that were requested.

## 2015-10-16 ENCOUNTER — Encounter (HOSPITAL_COMMUNITY): Payer: Medicare Other | Attending: Hematology & Oncology | Admitting: Hematology & Oncology

## 2015-10-16 ENCOUNTER — Encounter (HOSPITAL_COMMUNITY): Payer: Self-pay | Admitting: Hematology & Oncology

## 2015-10-16 VITALS — BP 154/63 | HR 100 | Temp 98.6°F | Resp 16 | Wt 191.4 lb

## 2015-10-16 DIAGNOSIS — Z170 Estrogen receptor positive status [ER+]: Secondary | ICD-10-CM

## 2015-10-16 DIAGNOSIS — Z79899 Other long term (current) drug therapy: Secondary | ICD-10-CM | POA: Insufficient documentation

## 2015-10-16 DIAGNOSIS — Z853 Personal history of malignant neoplasm of breast: Secondary | ICD-10-CM | POA: Insufficient documentation

## 2015-10-16 DIAGNOSIS — C50312 Malignant neoplasm of lower-inner quadrant of left female breast: Secondary | ICD-10-CM | POA: Insufficient documentation

## 2015-10-16 DIAGNOSIS — C50912 Malignant neoplasm of unspecified site of left female breast: Secondary | ICD-10-CM | POA: Insufficient documentation

## 2015-10-16 MED ORDER — ANASTROZOLE 1 MG PO TABS: 1.0000 mg | ORAL_TABLET | Freq: Every day | ORAL | Status: DC

## 2015-10-16 NOTE — Patient Instructions (Addendum)
[place] at [facility] Discharge Instructions  RECOMMENDATIONS MADE BY THE CONSULTANT AND ANY TEST RESULTS WILL BE SENT TO YOUR REFERRING PHYSICIAN.  Exam completed by Dr Whitney Muse today Calcium 1000 mg with vitamin D daily Arimidix 1mg  daily 90 day supply sent to your pharmacy Return to see the doctor in 5 weeks with lab work Please call the clinic if you have any questions or concerns   Thank you for choosing [place] at [facility] to provide your oncology and hematology care.  To afford each patient quality time with our provider, please arrive at least 15 minutes before your scheduled appointment time.    You need to re-schedule your appointment should you arrive 10 or more minutes late.  We strive to give you quality time with our providers, and arriving late affects you and other patients whose appointments are after yours.  Also, if you no show three or more times for appointments you may be dismissed from the clinic at the providers discretion.     Again, thank you for choosing [facility].  Our hope is that these requests will decrease the amount of time that you wait before being seen by our physicians.       _____________________________________________________________  Should you have questions after your visit to [facility], please contact our office at [phone] between the hours of 8:30 a.m. and 4:30 p.m.  Voicemails left after 4:30 p.m. will not be returned until the following business day.  For prescription refill requests, have your pharmacy contact our office.      Anastrozole tablets What is this medicine? ANASTROZOLE (an AS troe zole) is used to treat breast cancer in women who have gone through menopause. Some types of breast cancer depend on estrogen to grow, and this medicine can stop tumor growth by blocking estrogen production. This medicine may be used for other purposes; ask your  health care provider or pharmacist if you have questions. What should I tell my health care provider before I take this medicine? They need to know if you have any of these conditions: -liver disease -an unusual or allergic reaction to anastrozole, other medicines, foods, dyes, or preservatives -pregnant or trying to get pregnant -breast-feeding How should I use this medicine? Take this medicine by mouth with a glass of water. Follow the directions on the prescription label. You can take this medicine with or without food. Take your doses at regular intervals. Do not take your medicine more often than directed. Do not stop taking except on the advice of your doctor or health care professional. Talk to your pediatrician regarding the use of this medicine in children. Special care may be needed. Overdosage: If you think you have taken too much of this medicine contact a poison control center or emergency room at once. NOTE: This medicine is only for you. Do not share this medicine with others. What if I miss a dose? If you miss a dose, take it as soon as you can. If it is almost time for your next dose, take only that dose. Do not take double or extra doses. What may interact with this medicine? Do not take this medicine with any of the following medications: -female hormones, like estrogens or progestins and birth control pills This medicine may also interact with the following medications: -tamoxifen This list may not describe all possible interactions. Give your health care provider a list of all the  medicines, herbs, non-prescription drugs, or dietary supplements you use. Also tell them if you smoke, drink alcohol, or use illegal drugs. Some items may interact with your medicine. What should I watch for while using this medicine? Visit your doctor or health care professional for regular checks on your progress. Let your doctor or health care professional know about any unusual vaginal  bleeding. Do not treat yourself for diarrhea, nausea, vomiting or other side effects. Ask your doctor or health care professional for advice. What side effects may I notice from receiving this medicine? Side effects that you should report to your doctor or health care professional as soon as possible: -allergic reactions like skin rash, itching or hives, swelling of the face, lips, or tongue -any new or unusual symptoms -breathing problems -chest pain -leg pain or swelling -vomiting Side effects that usually do not require medical attention (report to your doctor or health care professional if they continue or are bothersome): -back or bone pain -cough, or throat infection -diarrhea or constipation -dizziness -headache -hot flashes -loss of appetite -nausea -sweating -weakness and tiredness -weight gain This list may not describe all possible side effects. Call your doctor for medical advice about side effects. You may report side effects to FDA at 1-800-FDA-1088. Where should I keep my medicine? Keep out of the reach of children. Store at room temperature between 20 and 25 degrees C (68 and 77 degrees F). Throw away any unused medicine after the expiration date. NOTE: This sheet is a summary. It may not cover all possible information. If you have questions about this medicine, talk to your doctor, pharmacist, or health care provider.    2016, [employer]. (2008-01-13 16:31:52)

## 2015-10-16 NOTE — Progress Notes (Signed)
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 Arnoldo Morale on 06/26/2015, no sentinel node performed Sentinel node procedure on 08/01/2015 negative sentinel node. ONCOTYPE DX assay with recurrence score result of 5, Low risk, 10 year risk of distance recurrence tamoxifen alone 5%   HISTORY OF PRESENTING ILLNESS:  [name] 69 y.o. 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 Her original biopsy was performed on 05/02/2015 showing DCIS. Unfortunately after her definitive surgery, invasive disease was noted. Total tumor dimension was 2 cm.  Margins were good with the closest margin being approximately 1 mm. She had to go back and have a sentinel node. Since her original biopsy showed only DCIS it was not anticipated she would have invasive disease.  [name] is here alone today. She reports doing well with radiation.  She reports eating well, and sleeping okay. She says "I nap." She denies any worsening since the radiation. She also denies any new concerns or problems.  For Thanksgiving she went to [facility] for a [employer]. She got a little sunburned during this trip. She plays in a bluegrass band, the bass fiddle. Her husband and her two sons play and sing in the band with  her.  Her cholesterol is followed.  As far as starting aromatase inhibitors, she would like to try Arimidex first, since it's the cheapest. She is currently taking 2000 mg a day of vitamin D.  She has had a flu shot this year.   MEDICAL HISTORY:  Past Medical History  Diagnosis Date  . Essential hypertension, benign   . Diabetes mellitus   . Other and unspecified hyperlipidemia   . Nonspecific abnormal finding in stool contents 01/11/2012  . GERD (gastroesophageal reflux disease)   . Arthritis   . Kidney stones   . Hiatal hernia   . Breast cancer ([place]) 06/26/15    left  . Hepatitis     Hep A as a child  . Disorder of bone and cartilage, unspecified     SURGICAL HISTORY: Past Surgical History  Procedure Laterality Date  . Cataract extraction    . Foot surgery    . Abdominal hysterectomy    . Lithotripsy    . Colonoscopy w/ biopsies and polypectomy    . Open reduction internal fixation (orif) distal radial fracture Left 11/02/2014    Procedure: OPEN REDUCTION INTERNAL FIXATION (ORIF) DISTAL RADIAL FRACTURE;  Surgeon: Charlotte Crumb, MD;  Location: [place];  Service: Orthopedics;  Laterality: Left;  . Carpal tunnel release Left 11/02/2014    Procedure: CARPAL TUNNEL RELEASE;  Surgeon: Charlotte Crumb, MD;  Location: [facility];  Service: Orthopedics;  Laterality: Left;  . Partial mastectomy with needle localization Left 06/26/2015    Procedure: PARTIAL MASTECTOMY WITH NEEDLE LOCALIZATION;  Surgeon: Aviva Signs, MD;  Location: [facility];  Service: General;  Laterality: Left;  Needle Loc @  8:00am  . Axillary sentinel node biopsy Left 07/31/2015    Procedure: SENTINEL LYMPH NODE BIOPSY, LEFT AXILLA, ;  Surgeon: Aviva Signs, MD;  Location: [facility];  Service: General;  Laterality: Left;  Sentinel Node @ 1000    SOCIAL HISTORY: Social History   Social History  . Marital Status: Married    Spouse Name: N/A  . Number of Children: 2  . Years of Education: N/A   Occupational History   . Retired    Social History Main Topics  . Smoking status: Former Research scientist (life sciences)  . Smokeless tobacco: Never Used  . Alcohol Use: No  . Drug Use: No  . Sexual Activity: Not on file   Other Topics Concern  . Not on file   Social History Narrative  Married, 52 years 2 children Formerly employed in [employer] for 29 years Hobbies include playing [place] Ex-smoker, Began at age 89, Quit many years ago ETOH, none   FAMILY HISTORY: Family History  Problem Relation Age of Onset  . Hypertension Mother   . Osteoporosis Mother   . Diabetes Sister     x3  . Colon cancer Neg Hx    has no family status information on file.   ALLERGIES:  has No Known Allergies.   Mother deceased, 59, old age, she had a removal small cancer in lining of stomach Father deceased, 63, car accident 4 sisters. 2 deceased from COPD, heart attacks, age 40; bed ridden with diabetes, COPD, age 13   MEDICATIONS:  Current Outpatient Prescriptions  Medication Sig Dispense Refill  . alendronate (FOSAMAX) 70 MG tablet Take 70 mg by mouth every 7 (seven) days. Take with a full glass of water on an empty stomach.    . Cholecalciferol (VITAMIN D-3 PO) Take 1 tablet by mouth daily.    Marland Kitchen losartan-hydrochlorothiazide (HYZAAR) 50-12.5 MG per tablet Take 1 tablet by mouth daily.    . metFORMIN (GLUCOPHAGE) 1000 MG tablet Take 1,000 mg by mouth 2 (two) times daily with a meal.    . Multiple Vitamins-Calcium (ONE-A-DAY WOMENS PO) Take 1 tablet by mouth daily.    . Omega-3 Fatty Acids (FISH OIL PO) Take 1 capsule by mouth 2 (two) times daily.     Marland Kitchen omeprazole (PRILOSEC) 20 MG capsule Take 20 mg by mouth daily.    Marland Kitchen oxyCODONE-acetaminophen (ROXICET) 5-325 MG per tablet Take 1 tablet by mouth every 4 (four) hours as needed for severe pain. 40 tablet 0  . simvastatin (ZOCOR) 20 MG tablet Take 20 mg by mouth daily.    Marland Kitchen anastrozole (ARIMIDEX) 1 MG tablet Take 1 tablet (1 mg total) by mouth daily. 90 tablet 1   No current  facility-administered medications for this visit.    Review of Systems  Constitutional: Negative for fever, chills, weight loss and malaise/fatigue.  HENT: Negative for congestion, hearing loss, nosebleeds, sore throat and tinnitus.   Eyes: Negative for blurred vision, double vision, pain and discharge.  Respiratory: Negative for cough, hemoptysis, sputum production, shortness of breath and wheezing.   Cardiovascular: Negative for chest pain, palpitations, claudication, leg swelling and PND.  Gastrointestinal: Negative for heartburn, nausea, vomiting, abdominal pain, diarrhea, constipation, blood in stool and melena.  Genitourinary: Negative for dysuria, urgency, frequency and hematuria.  Musculoskeletal: Negative for myalgias, joint pain and falls.  Skin: Negative for itching and rash.  Neurological: Negative for dizziness, tingling, tremors, sensory change, speech change, focal weakness, seizures, loss of consciousness, weakness and headaches.  Endo/Heme/Allergies: Does not bruise/bleed  easily.  Psychiatric/Behavioral: Negative for depression, suicidal ideas, memory loss and substance abuse. The patient is not nervous/anxious and does not have insomnia.    14 point ROS was done and is otherwise as detailed above or in HPI   PHYSICAL EXAMINATION: ECOG PERFORMANCE STATUS: 0 - Asymptomatic  Filed Vitals:   10/16/15 1100  BP: 154/63  Pulse: 100  Temp: 98.6 F (37 C)  Resp: 16   Filed Weights   10/16/15 1100  Weight: 191 lb 6.4 oz (86.818 kg)   Physical Exam  Constitutional: She is oriented to person, place, and time and well-developed, well-nourished, and in no distress.  HENT:  Head: Normocephalic and atraumatic.  Nose: Nose normal.  Mouth/Throat: Oropharynx is clear and moist. No oropharyngeal exudate.  Eyes: Conjunctivae and EOM are normal. Pupils are equal, round, and reactive to light. Right eye exhibits no discharge. Left eye exhibits no discharge. No scleral icterus.    Neck: Normal range of motion. Neck supple. No tracheal deviation present. No thyromegaly present.  Cardiovascular: Normal rate, regular rhythm and normal heart sounds.  Exam reveals no gallop and no friction rub.   No murmur heard. Pulmonary/Chest: Effort normal and breath sounds normal. She has no wheezes. She has no rales.  BREAST:  Radiation changes on the L breast; darkening of the skin, erythema and minor desquamation Abdominal: Soft. Bowel sounds are normal. She exhibits no distension and no mass. There is no tenderness. There is no rebound and no guarding.  Musculoskeletal: Normal range of motion. She exhibits no edema.  Lymphadenopathy:    She has no cervical adenopathy.  Neurological: She is alert and oriented to person, place, and time. She has normal reflexes. No cranial nerve deficit. Gait normal. Coordination normal.  Skin: Skin is warm and dry. No rash noted.  Psychiatric: Mood, memory, affect and judgment normal.  Nursing note and vitals reviewed.  left breast is examined with a well-healed surgical incision site from the approximate 8:00 to 4:00 position.   LABORATORY DATA:  I have reviewed the data as listed  CBC    Component Value Date/Time   WBC 7.1 07/26/2015 1505   RBC 4.46 07/26/2015 1505   HGB 11.5* 07/26/2015 1505   HCT 36.9 07/26/2015 1505   PLT 210 07/26/2015 1505   MCV 82.7 07/26/2015 1505   MCH 25.8* 07/26/2015 1505   MCHC 31.2 07/26/2015 1505   RDW 16.1* 07/26/2015 1505   LYMPHSABS 2.0 07/26/2015 1505   MONOABS 0.7 07/26/2015 1505   EOSABS 0.1 07/26/2015 1505   BASOSABS 0.0 07/26/2015 1505   CMP     Component Value Date/Time   NA 140 07/26/2015 1505   K 3.9 07/26/2015 1505   CL 105 07/26/2015 1505   CO2 26 07/26/2015 1505   GLUCOSE 112* 07/26/2015 1505   BUN 20 07/26/2015 1505   CREATININE 0.98 07/26/2015 1505   CALCIUM 9.4 07/26/2015 1505   PROT 6.7 06/17/2015 1445   ALBUMIN 4.0 06/17/2015 1445   AST 28 06/17/2015 1445   ALT 27  06/17/2015 1445   ALKPHOS 76 06/17/2015 1445   BILITOT 0.3 06/17/2015 1445   GFRNONAA 57* 07/26/2015 1505   GFRAA >60 07/26/2015 1505    RADIOGRAPHIC STUDIES: I have personally reviewed the radiological images as listed and agreed with the findings in the report.   REPORT OF SURGICAL PATHOLOGY ADDITIONAL INFORMATION: PROGNOSTIC INDICATORS Results: IMMUNOHISTOCHEMICAL AND MORPHOMETRIC ANALYSIS PERFORMED MANUALLY Estrogen Receptor: 100%, POSITIVE, STRONG STAINING INTENSITY Progesterone Receptor: 60%, POSITIVE, STRONG STAINING INTENSITY Proliferation  Marker Ki67: 25% REFERENCE RANGE ESTROGEN RECEPTOR NEGATIVE 0% POSITIVE =>1% REFERENCE RANGE PROGESTERONE RECEPTOR NEGATIVE 0% POSITIVE =>1% All controls stained appropriately Enid Cutter MD Pathologist, Electronic Signature ( Signed 07/02/2015) FLUORESCENCE IN-SITU HYBRIDIZATION Results: HER2 - NEGATIVE RATIO OF HER2/CEP17 SIGNALS 1.44 AVERAGE HER2 COPY NUMBER PER CELL 1.95 Reference Range: NEGATIVE HER2/CEP17 Ratio <2.0 and average HER2 copy number <4.0 EQUIVOCAL HER2/CEP17 Ratio <2.0 and average HER2 copy number >=4.0 and <6.0 1 of 4 FINAL for [name], [name] ([id number]) ADDITIONAL INFORMATION:(continued) POSITIVE HER2/CEP17 Ratio >=2.0 or <2.0 and average HER2 copy number >=6.0 Enid Cutter MD Pathologist, Electronic Signature ( Signed 07/02/2015) FINAL DIAGNOSIS Diagnosis Breast, lumpectomy, left - INVASIVE DUCTAL CARCINOMA, 2 CM. - HIGH GRADE DUCTAL CARCINOMA IN SITU. - INVASIVE CARCINOMA FOCALLY LESS THAN 0.1 CM FROM LATERAL MARGIN. - DUCTAL CARCINOMA IN SITU FOCALLY LESS THAN 0.1 CM FROM THE LATERAL MARGIN. Microscopic Comment BREAST, INVASIVE TUMOR, WITHOUT LYMPH NODES PRESENT Specimen, including laterality : Left breast. Procedure: Lumpectomy. Histologic type: Ductal. Grade: 2 Tubule formation: 3 Nuclear pleomorphism: 2 Mitotic: 2 Tumor size (gross measurement): 2 cm Margins: Free of tumor. Invasive,  distance to closest margin: Less than 0.1 cm from the lateral margin. In-situ, distance to closest margin: Less than 0.1 cm from lateral margin. If margin positive, focally or broadly: N/A Lymphovascular invasion: Not identified. Ductal carcinoma in situ: Present. Grade: High grade. Extensive intraductal component: Yes Lobular neoplasia: No Tumor focality: Unifocal. Treatment effect: No If present, treatment effect in breast tissue, lymph nodes or both: N/A Extent of tumor: Skin: N/A Nipple: N/A Skeletal muscle: N/A Breast prognostic profile: Pending. Estrogen receptor: Pending. Progesterone receptor: Pending. Her 2 neu: Pending. Ki-67: Pending. Non-neoplastic breast: Fibrocystic changes. 2 of 4 FINAL for [name], [name] [phone]) Microscopic Comment(continued) TNM: pT1c, pNX, pMX Comments: There is extensive high grade ductal carcinoma in situ associated with moderately differentiated invasive ductal carcinoma. There are multiple foci where the tumor is less than 0.1 cm from the lateral margin. The remaining margins are 1 cm and greater. A breast prognostic profile is pending and will be reported as an addendum. (JDP:ecj 06/28/2015) Claudette Laws MD Pathologist, Electronic Signature (Case signed 06/28/2015) Specimen Gross and Clinical Information Specimen(s) Obtained: Breast, lumpectomy, left Specimen Clinical Information left breast cancer Gross Specimen type: Received in formalin and placed in formalin at 10:50 a.m. on 06/26/15 is a left breast lumpectomy specimen. Size: 8.0 from superior to inferior x 7.0 from anterior to posterior x 1.5 cm from medial to lateral. Orientation: The specimen has a short suture designating superior and a long suture designating lateral. The specimen is inked as follows: green-anterior, blue-inferior, orange-lateral, yellow-medial, black-posterior, and red-superior. Localized area: A needle localization wire is inserted at the 8 o'clock  position, which inserts superiorly and exits medially. Cut surface: Sectioning reveals a 2.0 x 1.8 x 1.0 cm firm gray-white ill-defined, focally cystic lesion within which an X-shaped biopsy clip is identified. The remaining parenchyma is 99% yellow lobulated adipose tissue with minimal dense white fibrous tissue. Margins: The lesion abuts the lateral margin and is located 1.0 cm from the medial and posterior margins, 3.0 cm from the anterior margin, and far from the superior and inferior margin. Prognostic indicators: Not taken at time of gross. Block summary: The lesion is entirely submitted as follows: A = slice associated with biopsy clip to lateral margin B = representative lesion without margins C-E = representative lesion with lateral margin F = representative posterior, medial, and anterior margin G = representative superior and  inferior margins H = representative uninvolved fibrous tissue Eight blocks total (KF:ds 06/27/15) Stain(s) used in Diagnosis: The following stain(s) were used in diagnosing the case: Her2 FISH, ER-ACIS, PR-ACIS, KI-67-ACIS. The control(s) stained appropriately. Disclaimer HER2 IQFISH pharmDX (code [id number]) is a direct fluorescence in-situ hybridization assay designed to quantitatively determine HER2 gene amplification in formalin-fixed, paraffin-embedded tissue specimens. It is performed at [facility] and is reported using ASCO/CAP scoring criteria published in 2013. PR progesterone receptor (PgR 636), immunohistochemical stains are performed on formalin fixed, paraffin embedded tissue using a 3,3"-diaminobenzidine (DAB) chromogen and [employer] Autostainer System. The staining intensity of the nucleus is scored morphometrically using the Automated Cellular Imaging System (ACIS) and is reported as the percentage of tumor cell nuclei demonstrating specific nuclear staining. 3 of 4 FINAL for [name], [name] ([id number]) Disclaimer(continued) Estrogen  receptor (SP1), immunohistochemical stains are performed on formalin fixed, paraffin embedded tissue using a 3,3"-diaminobenzidine (DAB) chromogen and [employer] Autostainer System. The staining intensity of the nucleus is scored morphometrically using the Automated Cellular Imaging System (ACIS) and is reported as the percentage of tumor cell nuclei demonstrating specific nuclear staining. Ki-67 (Mib-1), immunohistochemical stains are performed on formalin fixed, paraffin embedded tissue using a 3,3"-diaminobenzidine (DAB) chromogen and [place]. The staining intensity of the nucleus is scored morphometrically using the Automated Cellular Imaging System (ACIS) and is reported as the percentage of tumor cell nuclei demonstrating specific nuclear staining. Report signed out from the following location(s) Technical component and interpretation was performed at [facility], [place], [place] [address]. 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 Arnoldo Morale on 06/26/2015, no sentinel node performed Sentinel node procedure on 08/01/2015 negative sentinel node. ONCOTYPE DX assay with recurrence score result of 5, Low risk, 10 year risk of distance recurrence tamoxifen alone 5% DEXA 08/26/2015 with normal bone density   I reviewed the patient's Oncotype in detail. She thousand to the low risk category and I advised her that there is little benefit from  systemic chemotherapy.She has completed XRT and done well.  DEXA was reviewed in detail, she has normal bone density.  She was advised to take calcium 1000 to 1200 mg daily with at least 1000 IU of vitamin D daily.    We discussed the risks and benefits of anti-estrogen therapy with aromatase inhibitors. These include but not limited to insomnia, hot flashes, mood changes, vaginal dryness, bone density loss, and weight gain. Although rare, serious side effects including endometrial cancer, risk of blood clots were also discussed. We strongly believe that the benefits far outweigh the risks. Patient understands these risks and consented to starting treatment. Planned treatment duration is 5 years.  Her pharmacy is [facility]. We will send her a 90-day supply of Arimidex there.  I will follow up with her in 5 weeks. We will check her blood work at that time.  Orders Placed This Encounter  Procedures  . CBC with Differential    Standing Status: Future     Number of Occurrences: 1  Standing Expiration Date: 10/15/2016  . Comprehensive metabolic panel    Standing Status: Future     Number of Occurrences: 1     Standing Expiration Date: 10/15/2016  . Vitamin D 25 hydroxy    Standing Status: Future     Number of Occurrences: 1     Standing Expiration Date: 10/15/2016    All questions were answered. The patient knows to call the clinic with any problems, questions or concerns.   This document serves as a record of services personally performed by Ancil Linsey, MD. It was created on her behalf by Toni Amend, a trained medical scribe. The creation of this record is based on the scribe's personal observations and the provider's statements to them. This document has been checked and approved by the attending provider.  I have reviewed the above documentation for accuracy and completeness, and I agree with the above.  This note was electronically signed.  Kelby Fam.  Whitney Muse, MD

## 2015-11-20 ENCOUNTER — Encounter (HOSPITAL_COMMUNITY): Payer: PPO | Attending: Hematology & Oncology | Admitting: Hematology & Oncology

## 2015-11-20 ENCOUNTER — Encounter (HOSPITAL_BASED_OUTPATIENT_CLINIC_OR_DEPARTMENT_OTHER): Payer: PPO

## 2015-11-20 ENCOUNTER — Encounter (HOSPITAL_COMMUNITY): Payer: Self-pay | Admitting: Hematology & Oncology

## 2015-11-20 VITALS — BP 140/56 | HR 89 | Temp 97.9°F | Resp 18 | Wt 188.4 lb

## 2015-11-20 DIAGNOSIS — C50912 Malignant neoplasm of unspecified site of left female breast: Secondary | ICD-10-CM | POA: Diagnosis not present

## 2015-11-20 DIAGNOSIS — Z79899 Other long term (current) drug therapy: Secondary | ICD-10-CM | POA: Insufficient documentation

## 2015-11-20 DIAGNOSIS — C50312 Malignant neoplasm of lower-inner quadrant of left female breast: Secondary | ICD-10-CM

## 2015-11-20 LAB — CBC WITH DIFFERENTIAL/PLATELET
Basophils Absolute: 0 10*3/uL (ref 0.0–0.1)
Basophils Relative: 0 %
EOS ABS: 0 10*3/uL (ref 0.0–0.7)
Eosinophils Relative: 1 %
HCT: 37.3 % (ref 36.0–46.0)
HEMOGLOBIN: 11.6 g/dL — AB (ref 12.0–15.0)
LYMPHS ABS: 1.1 10*3/uL (ref 0.7–4.0)
LYMPHS PCT: 20 %
MCH: 25.6 pg — ABNORMAL LOW (ref 26.0–34.0)
MCHC: 31.1 g/dL (ref 30.0–36.0)
MCV: 82.3 fL (ref 78.0–100.0)
MONOS PCT: 7 %
Monocytes Absolute: 0.4 10*3/uL (ref 0.1–1.0)
NEUTROS ABS: 4.2 10*3/uL (ref 1.7–7.7)
NEUTROS PCT: 72 %
Platelets: 206 10*3/uL (ref 150–400)
RBC: 4.53 MIL/uL (ref 3.87–5.11)
RDW: 16 % — AB (ref 11.5–15.5)
WBC: 5.8 10*3/uL (ref 4.0–10.5)

## 2015-11-20 LAB — COMPREHENSIVE METABOLIC PANEL
ALBUMIN: 3.6 g/dL (ref 3.5–5.0)
ALT: 29 U/L (ref 14–54)
ANION GAP: 10 (ref 5–15)
AST: 27 U/L (ref 15–41)
Alkaline Phosphatase: 84 U/L (ref 38–126)
BILIRUBIN TOTAL: 0.3 mg/dL (ref 0.3–1.2)
BUN: 18 mg/dL (ref 6–20)
CO2: 29 mmol/L (ref 22–32)
Calcium: 9.5 mg/dL (ref 8.9–10.3)
Chloride: 104 mmol/L (ref 101–111)
Creatinine, Ser: 0.9 mg/dL (ref 0.44–1.00)
GFR calc Af Amer: >60 mL/min (ref >60)
GFR calc non Af Amer: >60 mL/min (ref >60)
GLUCOSE: 117 mg/dL — AB (ref 65–99)
POTASSIUM: 3.8 mmol/L (ref 3.5–5.1)
SODIUM: 143 mmol/L (ref 135–145)
Total Protein: 6.7 g/dL (ref 6.5–8.1)

## 2015-11-20 NOTE — Patient Instructions (Signed)
[place] at [facility] Discharge Instructions  RECOMMENDATIONS MADE BY THE CONSULTANT AND ANY TEST RESULTS WILL BE SENT TO YOUR REFERRING PHYSICIAN.  Based on your bone density we recommend that you continue to take your calcium and Vit. D, but you may not need to continue Fosamax.  Please talk to your primary care provider about this.   Your bone scan look good.  We will repeat this in about 2 years.   You will have stabbing and sharp pains in the breast that was operated on periodically.  This is normal.  Return in 3 months to see the doctor.     Thank you for choosing [place] at [facility] to provide your oncology and hematology care.  To afford each patient quality time with our provider, please arrive at least 15 minutes before your scheduled appointment time.    You need to re-schedule your appointment should you arrive 10 or more minutes late.  We strive to give you quality time with our providers, and arriving late affects you and other patients whose appointments are after yours.  Also, if you no show three or more times for appointments you may be dismissed from the clinic at the providers discretion.     Again, thank you for choosing [facility]  Our hope is that these requests will decrease the amount of time that you wait before being seen by our physicians.       _____________________________________________________________  Should you have questions after your visit to [facility], please contact our office at [phone] between the hours of 8:30 a.m. and 4:30 p.m.  Voicemails left after 4:30 p.m. will not be returned until the following business day.  For prescription refill requests, have your pharmacy contact our office.

## 2015-11-20 NOTE — Progress Notes (Signed)
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 Lovell Sims on 06/26/2015, no sentinel node performed Sentinel node procedure on 08/01/2015 negative sentinel node. ONCOTYPE DX assay with recurrence score result of 5, Low risk, 10 year risk of distance recurrence tamoxifen alone 5%   HISTORY OF PRESENTING ILLNESS:  [name] 70 y.o. 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 Her original biopsy was performed on 05/02/2015 showing DCIS. Unfortunately after her definitive surgery, invasive disease was noted. Total tumor dimension was 2 cm.  Margins were good with the closest margin being approximately 1 mm. She had to go back and have a sentinel node. Since her original biopsy showed only DCIS it was not anticipated she would have invasive disease.  [name] returns to the [facility] alone today. She is dressed in a hospital gown today for her breast exam.   She says that, lately, she's been doing good on her pill and hasn't had any problems so far. She sees her PCP again on March 1.  She reports that lately her appetite is so-so and that her holidays were good, aside from her stomach virus. She remarks that she's still a little queasy from that, which she came down with on Tuesday after  Christmas.  She says she's still taking her regular little naps; in general, her sleeping habits are unchanged.   ROS questioning is negative. She confirms that her bowels are ok and denies any burning while she urinates.  She denies any problems with her right breast. She also confirms that her hands and feet are okay.  She says that the only issue is that sometimes, in her right breast, there will be a "a little shooting pain." She knows that these symptoms are due to her nerves being changed due to surgery in that breast.  All in all, she doesn't have any pressing questions or concerns today.  She does not currently need any refills. She recently changed her insurance, and knows to call the clinic when she needs a refill to get this sorted out.  MEDICAL HISTORY:  Past Medical History  Diagnosis Date  . Essential hypertension, benign   . Diabetes mellitus   . Other and unspecified hyperlipidemia   . Nonspecific abnormal finding in stool contents 01/11/2012  . GERD (gastroesophageal reflux disease)   . Arthritis   . Kidney stones   . Hiatal hernia   . Breast cancer (HCC) 06/26/15    left  . Hepatitis     Hep A as a child  . Disorder of bone and cartilage, unspecified     SURGICAL HISTORY: Past Surgical History  Procedure Laterality Date  . Cataract extraction    . Foot surgery    . Abdominal hysterectomy    . Lithotripsy    . Colonoscopy w/ biopsies and polypectomy    . Open reduction internal fixation (  orif) distal radial fracture Left 11/02/2014    Procedure: OPEN REDUCTION INTERNAL FIXATION (ORIF) DISTAL RADIAL FRACTURE;  Surgeon: Jessica Ponder, MD;  Location: [facility] OR;  Service: Orthopedics;  Laterality: Left;  . Carpal tunnel release Left 11/02/2014    Procedure: CARPAL TUNNEL RELEASE;  Surgeon: Jessica Ponder, MD;  Location: [facility] OR;  Service: Orthopedics;  Laterality: Left;  . Partial mastectomy with needle localization Left 06/26/2015    Procedure: PARTIAL MASTECTOMY  WITH NEEDLE LOCALIZATION;  Surgeon: Jessica Macho, MD;  Location: [facility] ORS;  Service: General;  Laterality: Left;  Needle Loc @ 8:00am  . Axillary sentinel node biopsy Left 07/31/2015    Procedure: SENTINEL LYMPH NODE BIOPSY, LEFT AXILLA, ;  Surgeon: Jessica Macho, MD;  Location: [facility] ORS;  Service: General;  Laterality: Left;  Sentinel Node @ 1000    SOCIAL HISTORY: Social History   Social History  . Marital Status: Married    Spouse Name: N/A  . Number of Children: 2  . Years of Education: N/A   Occupational History  . Retired    Social History Main Topics  . Smoking status: Former Games developer  . Smokeless tobacco: Never Used  . Alcohol Use: No  . Drug Use: No  . Sexual Activity: Not on file   Other Topics Concern  . Not on file   Social History Narrative  Married, 55 years 2 children Formerly employed in Designer, fashion/clothing for 29 years Hobbies include playing [employer] music Ex-smoker, Began at age 28, Quit many years ago ETOH, none   FAMILY HISTORY: Family History  Problem Relation Age of Onset  . Hypertension Mother   . Osteoporosis Mother   . Diabetes Sister     x3  . Colon cancer Neg Hx    has no family status information on file.   ALLERGIES:  has No Known Allergies.   Mother deceased, 36, old age, she had a removal small cancer in lining of stomach Father deceased, 14, car accident 4 sisters. 2 deceased from COPD, heart attacks, age 23; bed ridden with diabetes, COPD, age 49   MEDICATIONS:  Current Outpatient Prescriptions  Medication Sig Dispense Refill  . alendronate (FOSAMAX) 70 MG tablet Take 70 mg by mouth every 7 (seven) days. Take with a full glass of water on an empty stomach.    Marland Kitchen anastrozole (ARIMIDEX) 1 MG tablet Take 1 tablet (1 mg total) by mouth daily. 90 tablet 1  . calcium carbonate (TUMS - DOSED IN MG ELEMENTAL CALCIUM) 500 MG chewable tablet Chew 1 tablet by mouth daily.    . Cholecalciferol (VITAMIN D-3 PO) Take 1 tablet by mouth 2 (two) times daily.  1000 mg    . losartan-hydrochlorothiazide (HYZAAR) 50-12.5 MG per tablet Take 1 tablet by mouth daily.    . metFORMIN (GLUCOPHAGE) 1000 MG tablet Take 1,000 mg by mouth 2 (two) times daily with a meal.    . Multiple Vitamins-Calcium (ONE-A-DAY WOMENS PO) Take 1 tablet by mouth daily.    . Omega-3 Fatty Acids (FISH OIL PO) Take 1 capsule by mouth 2 (two) times daily.     Marland Kitchen omeprazole (PRILOSEC) 20 MG capsule Take 20 mg by mouth daily.    Marland Kitchen oxyCODONE-acetaminophen (ROXICET) 5-325 MG per tablet Take 1 tablet by mouth every 4 (four) hours as needed for severe pain. 40 tablet 0  . simvastatin (ZOCOR) 20 MG tablet Take 20 mg by mouth daily.     No current facility-administered medications for this visit.   Review of Systems  Constitutional: Negative for fever, chills, weight loss and malaise/fatigue.  HENT: Negative for congestion, hearing loss, nosebleeds, sore throat and tinnitus.   Eyes: Negative for blurred vision, double vision, pain and discharge.  Respiratory: Negative for cough, hemoptysis, sputum production, shortness of breath and wheezing.   Cardiovascular: Negative for chest pain, palpitations, claudication, leg swelling and PND.  Gastrointestinal: Negative for heartburn, nausea, vomiting, abdominal pain, diarrhea, constipation, blood in stool and melena.  Genitourinary: Negative for dysuria, urgency, frequency and hematuria.  Musculoskeletal: Negative for myalgias, joint pain and falls.  Skin: Negative for itching and rash.  Neurological: Negative for dizziness, tingling, tremors, sensory change, speech change, focal weakness, seizures, loss of consciousness, weakness and headaches.  Endo/Heme/Allergies: Does not bruise/bleed easily.  Psychiatric/Behavioral: Negative for depression, suicidal ideas, memory loss and substance abuse. The patient is not nervous/anxious and does not have insomnia.    14 point ROS was done and is otherwise as detailed above or in HPI   PHYSICAL  EXAMINATION: ECOG PERFORMANCE STATUS: 0 - Asymptomatic  Filed Vitals:   11/20/15 0900  BP: 140/56  Pulse: 89  Temp: 97.9 F (36.6 C)  Resp: 18   Filed Weights   11/20/15 0900  Weight: 188 lb 6.4 oz (85.458 kg)   Physical Exam  Constitutional: She is oriented to person, place, and time and well-developed, well-nourished, and in no distress.  HENT:  Head: Normocephalic and atraumatic.  Nose: Nose normal.  Mouth/Throat: Oropharynx is clear and moist. No oropharyngeal exudate.  Eyes: Conjunctivae and EOM are normal. Pupils are equal, round, and reactive to light. Right eye exhibits no discharge. Left eye exhibits no discharge. No scleral icterus.  Neck: Normal range of motion. Neck supple. No tracheal deviation present. No thyromegaly present.  Cardiovascular: Normal rate, regular rhythm and normal heart sounds.  Exam reveals no gallop and no friction rub.   No murmur heard. Pulmonary/Chest: Effort normal and breath sounds normal. She has no wheezes. She has no rales.  BREAST  L breast; her surgical incision site is at the 5-6 o'clock position; 4 cm in length; 3-4 cm from the nipple; well-healed. Abdominal: Soft. Bowel sounds are normal. She exhibits no distension and no mass. There is no tenderness. There is no rebound and no guarding.  Musculoskeletal: Normal range of motion. She exhibits no edema.  Lymphadenopathy:    She has no cervical adenopathy.  Neurological: She is alert and oriented to person, place, and time. She has normal reflexes. No cranial nerve deficit. Gait normal. Coordination normal.  Skin: Skin is warm and dry. No rash noted.  Psychiatric: Mood, memory, affect and judgment normal.  Nursing note and vitals reviewed.  left breast is examined with a well-healed surgical incision site from the approximate 8:00 to 4:00 position.  LABORATORY DATA:  I have reviewed the data as listed  CBC    Component Value Date/Time   WBC 5.8 11/20/2015 0938   RBC 4.53  11/20/2015 0938   HGB 11.6* 11/20/2015 0938   HCT 37.3 11/20/2015 0938   PLT 206 11/20/2015 0938   MCV 82.3 11/20/2015 0938   MCH 25.6* 11/20/2015 0938   MCHC 31.1 11/20/2015 0938   RDW 16.0* 11/20/2015 0938   LYMPHSABS 1.1 11/20/2015 0938   MONOABS 0.4 11/20/2015 0938   EOSABS 0.0 11/20/2015 0938   BASOSABS 0.0 11/20/2015 0938   CMP     Component Value Date/Time   NA 143 11/20/2015 0938   K 3.8 11/20/2015 0938   CL 104 11/20/2015 6644  CO2 29 11/20/2015 0938   GLUCOSE 117* 11/20/2015 0938   BUN 18 11/20/2015 0938   CREATININE 0.90 11/20/2015 0938   CALCIUM 9.5 11/20/2015 0938   PROT 6.7 11/20/2015 0938   ALBUMIN 3.6 11/20/2015 0938   AST 27 11/20/2015 0938   ALT 29 11/20/2015 0938   ALKPHOS 84 11/20/2015 0938   BILITOT 0.3 11/20/2015 0938   GFRNONAA >60 11/20/2015 0938   GFRAA >60 11/20/2015 9323    RADIOGRAPHIC STUDIES: I have personally reviewed the radiological images as listed and agreed with the findings in the report. No results found.  CLINICAL DATA: Post clip mammograms following stereotactic core needle biopsy of left breast calcifications and associated linear/branching soft tissue opacity.  EXAM: DIAGNOSTIC LEFT MAMMOGRAM POST ULTRASOUND BIOPSY  COMPARISON: Previous exam(s).  FINDINGS: Mammographic images were obtained following stereotactic guided biopsy of left breast calcifications. X shaped biopsy clip lies within the abnormal soft tissue and adjacent to residual calcifications in the lower inner left breast.  IMPRESSION: Well-positioned X shaped biopsy clip following stereotactic core needle biopsy.  Final Assessment: Post Procedure Mammograms for Marker Placement   Electronically Signed  By: Lajean Manes M.D.  On: 05/02/2015 11:43  CLINICAL DATA: Recent diagnosis of breast cancer. Preop for lumpectomy. Slight productive cough.  EXAM: CHEST 2 VIEW  COMPARISON: Chest radiograph 10/17/2004.  FINDINGS: The  heart size and mediastinal contours are within normal limits. Both lungs are clear. The visualized skeletal structures are unremarkable. Stable slight eventration or elevation RIGHT hemidiaphragm. Thoracic atherosclerosis.  IMPRESSION: No active cardiopulmonary disease.   Electronically Signed  By: Staci Righter M.D.  On: 06/17/2015 15:16 REPORT OF SURGICAL PATHOLOGY ADDITIONAL INFORMATION: PROGNOSTIC INDICATORS Results: IMMUNOHISTOCHEMICAL AND MORPHOMETRIC ANALYSIS PERFORMED MANUALLY Estrogen Receptor: 100%, POSITIVE, STRONG STAINING INTENSITY Progesterone Receptor: 60%, POSITIVE, STRONG STAINING INTENSITY Proliferation Marker Ki67: 25% REFERENCE RANGE ESTROGEN RECEPTOR NEGATIVE 0% POSITIVE =>1% REFERENCE RANGE PROGESTERONE RECEPTOR NEGATIVE 0% POSITIVE =>1% All controls stained appropriately Enid Cutter MD Pathologist, Electronic Signature ( Signed 07/02/2015) FLUORESCENCE IN-SITU HYBRIDIZATION Results: HER2 - NEGATIVE RATIO OF HER2/CEP17 SIGNALS 1.44 AVERAGE HER2 COPY NUMBER PER CELL 1.95 Reference Range: NEGATIVE HER2/CEP17 Ratio <2.0 and average HER2 copy number <4.0 EQUIVOCAL HER2/CEP17 Ratio <2.0 and average HER2 copy number >=4.0 and <6.0 1 of 4 FINAL for JENNISE, BOTH H ([id number]) ADDITIONAL INFORMATION:(continued) POSITIVE HER2/CEP17 Ratio >=2.0 or <2.0 and average HER2 copy number >=6.0 Enid Cutter MD Pathologist, Electronic Signature ( Signed 07/02/2015) FINAL DIAGNOSIS Diagnosis Breast, lumpectomy, left - INVASIVE DUCTAL CARCINOMA, 2 CM. - HIGH GRADE DUCTAL CARCINOMA IN SITU. - INVASIVE CARCINOMA FOCALLY LESS THAN 0.1 CM FROM LATERAL MARGIN. - DUCTAL CARCINOMA IN SITU FOCALLY LESS THAN 0.1 CM FROM THE LATERAL MARGIN. Microscopic Comment BREAST, INVASIVE TUMOR, WITHOUT LYMPH NODES PRESENT Specimen, including laterality : Left breast. Procedure: Lumpectomy. Histologic type: Ductal. Grade: 2 Tubule formation: 3 Nuclear pleomorphism:  2 Mitotic: 2 Tumor size (gross measurement): 2 cm Margins: Free of tumor. Invasive, distance to closest margin: Less than 0.1 cm from the lateral margin. In-situ, distance to closest margin: Less than 0.1 cm from lateral margin. If margin positive, focally or broadly: N/A Lymphovascular invasion: Not identified. Ductal carcinoma in situ: Present. Grade: High grade. Extensive intraductal component: Yes Lobular neoplasia: No Tumor focality: Unifocal. Treatment effect: No If present, treatment effect in breast tissue, lymph nodes or both: N/A Extent of tumor: Skin: N/A Nipple: N/A Skeletal muscle: N/A Breast prognostic profile: Pending. Estrogen receptor: Pending. Progesterone receptor: Pending. Her 2 neu: Pending. Ki-67: Pending. Non-neoplastic breast: Fibrocystic changes.  2 of 4 FINAL for WYLMA, TATEM [phone]) Microscopic Comment(continued) TNM: pT1c, pNX, pMX Comments: There is extensive high grade ductal carcinoma in situ associated with moderately differentiated invasive ductal carcinoma. There are multiple foci where the tumor is less than 0.1 cm from the lateral margin. The remaining margins are 1 cm and greater. A breast prognostic profile is pending and will be reported as an addendum. (JDP:ecj 06/28/2015) Claudette Laws MD Pathologist, Electronic Signature (Case signed 06/28/2015) Specimen Gross and Clinical Information Specimen(s) Obtained: Breast, lumpectomy, left Specimen Clinical Information left breast cancer Gross Specimen type: Received in formalin and placed in formalin at 10:50 a.m. on 06/26/15 is a left breast lumpectomy specimen. Size: 8.0 from superior to inferior x 7.0 from anterior to posterior x 1.5 cm from medial to lateral. Orientation: The specimen has a short suture designating superior and a long suture designating lateral. The specimen is inked as follows: green-anterior, blue-inferior, orange-lateral, yellow-medial, black-posterior, and  red-superior. Localized area: A needle localization wire is inserted at the 8 o'clock position, which inserts superiorly and exits medially. Cut surface: Sectioning reveals a 2.0 x 1.8 x 1.0 cm firm gray-white ill-defined, focally cystic lesion within which an X-shaped biopsy clip is identified. The remaining parenchyma is 99% yellow lobulated adipose tissue with minimal dense white fibrous tissue. Margins: The lesion abuts the lateral margin and is located 1.0 cm from the medial and posterior margins, 3.0 cm from the anterior margin, and far from the superior and inferior margin. Prognostic indicators: Not taken at time of gross. Block summary: The lesion is entirely submitted as follows: A = slice associated with biopsy clip to lateral margin B = representative lesion without margins C-E = representative lesion with lateral margin F = representative posterior, medial, and anterior margin G = representative superior and inferior margins H = representative uninvolved fibrous tissue Eight blocks total (KF:ds 06/27/15) Stain(s) used in Diagnosis: The following stain(s) were used in diagnosing the case: Her2 FISH, ER-ACIS, PR-ACIS, KI-67-ACIS. The control(s) stained appropriately. Disclaimer HER2 IQFISH pharmDX (code [id number]) is a direct fluorescence in-situ hybridization assay designed to quantitatively determine HER2 gene amplification in formalin-fixed, paraffin-embedded tissue specimens. It is performed at [facility] and is reported using ASCO/CAP scoring criteria published in 2013. PR progesterone receptor (PgR 636), immunohistochemical stains are performed on formalin fixed, paraffin embedded tissue using a 3,3"-diaminobenzidine (DAB) chromogen and [employer]ainer System. The staining intensity of the nucleus is scored morphometrically using the Automated Cellular Imaging System (ACIS) and is reported as the percentage of tumor cell nuclei demonstrating specific nuclear  staining. 3 of 4 FINAL for ANTONIETA, SLAVEN ([id number]) Disclaimer(continued) Estrogen receptor (SP1), immunohistochemical stains are performed on formalin fixed, paraffin embedded tissue using a 3,3"-diaminobenzidine (DAB) chromogen and [employer] Autostainer System. The staining intensity of the nucleus is scored morphometrically using the Automated Cellular Imaging System (ACIS) and is reported as the percentage of tumor cell nuclei demonstrating specific nuclear staining. Ki-67 (Mib-1), immunohistochemical stains are performed on formalin fixed, paraffin embedded tissue using a 3,3"-diaminobenzidine (DAB) chromogen and [place]. The staining intensity of the nucleus is scored morphometrically using the Automated Cellular Imaging System (ACIS) and is reported as the percentage of tumor cell nuclei demonstrating specific nuclear staining. Report signed out from the following location(s) Technical component and interpretation was performed at [facility], [place], [place] [address]. CLIA #: [id number], 4 of 4     ASSESSMENT & PLAN:  T1c, Nx, Mx ER+ (100%), PR + (60%), HER  2 neu negative carcinoma of the L breast Digital screening mammogram on 04/18/2015 showing calcifications in the left breast Diagnostic digital left mammogram on 04/22/2015 BI-RADS Category 4 with indeterminate left breast calcification, 1 x 4.2 x 1.2 cm developing group of heterogeneous calcifications Left breast stereotactic core needle biopsy on 05/02/2015 with final pathology DCIS, ER positive, PR positive Breast partial mastectomy after needle localization with Dr. Arnoldo Morale on 06/26/2015, no sentinel node performed Sentinel node procedure on 08/01/2015 negative sentinel node. ONCOTYPE DX assay with recurrence score result of 5, Low risk, 10 year risk of distance recurrence tamoxifen alone 5% DEXA on 08/26/2015 with normal bone density Arimidex 10/16/2015   Mrs. Zani looks  good today. I recommend stopping her fosamax for a while. We reivewed her DEXA and she has normal bone density.  She may need it again in the future, but for now, her bones look good. She will have a repeat DEXA every 2 years while on AI therapy.  In the mean time, I want her to continue taking her daily calcium and vitamin D.   She does not currently need any refills. She recently changed her insurance, and knows to call the clinic when she needs a refill to get this sorted out.  We will see her back in 3 months.  All questions were answered. The patient knows to call the clinic with any problems, questions or concerns.   This document serves as a record of services personally performed by Ancil Linsey, MD. It was created on her behalf by Toni Amend, a trained medical scribe. The creation of this record is based on the scribe's personal observations and the provider's statements to them. This document has been checked and approved by the attending provider.  I have reviewed the above documentation for accuracy and completeness, and I agree with the above.  This note was electronically signed.  Kelby Fam. Whitney Muse, MD

## 2015-11-21 LAB — VITAMIN D 25 HYDROXY (VIT D DEFICIENCY, FRACTURES): VIT D 25 HYDROXY: 45.1 ng/mL (ref 30.0–100.0)

## 2015-11-21 NOTE — Progress Notes (Signed)
LABS DRAWN

## 2015-12-09 ENCOUNTER — Telehealth (HOSPITAL_COMMUNITY): Payer: Self-pay | Admitting: *Deleted

## 2015-12-09 ENCOUNTER — Other Ambulatory Visit (HOSPITAL_COMMUNITY): Payer: Self-pay | Admitting: Oncology

## 2015-12-09 DIAGNOSIS — C50312 Malignant neoplasm of lower-inner quadrant of left female breast: Secondary | ICD-10-CM

## 2015-12-09 MED ORDER — ANASTROZOLE 1 MG PO TABS: 1.0000 mg | ORAL_TABLET | Freq: Every day | ORAL | Status: DC

## 2015-12-20 ENCOUNTER — Other Ambulatory Visit (HOSPITAL_COMMUNITY): Payer: Self-pay | Admitting: *Deleted

## 2015-12-20 DIAGNOSIS — C50312 Malignant neoplasm of lower-inner quadrant of left female breast: Secondary | ICD-10-CM

## 2015-12-20 MED ORDER — ANASTROZOLE 1 MG PO TABS: 1.0000 mg | ORAL_TABLET | Freq: Every day | ORAL | Status: DC

## 2016-01-12 DIAGNOSIS — K219 Gastro-esophageal reflux disease without esophagitis: Secondary | ICD-10-CM | POA: Insufficient documentation

## 2016-01-12 DIAGNOSIS — I10 Essential (primary) hypertension: Secondary | ICD-10-CM | POA: Insufficient documentation

## 2016-01-12 DIAGNOSIS — M8580 Other specified disorders of bone density and structure, unspecified site: Secondary | ICD-10-CM | POA: Insufficient documentation

## 2016-01-12 DIAGNOSIS — L570 Actinic keratosis: Secondary | ICD-10-CM | POA: Insufficient documentation

## 2016-01-12 DIAGNOSIS — L821 Other seborrheic keratosis: Secondary | ICD-10-CM | POA: Insufficient documentation

## 2016-01-12 DIAGNOSIS — E782 Mixed hyperlipidemia: Secondary | ICD-10-CM | POA: Insufficient documentation

## 2016-01-15 DIAGNOSIS — E782 Mixed hyperlipidemia: Secondary | ICD-10-CM | POA: Diagnosis not present

## 2016-01-15 DIAGNOSIS — E6609 Other obesity due to excess calories: Secondary | ICD-10-CM | POA: Diagnosis not present

## 2016-01-15 DIAGNOSIS — I10 Essential (primary) hypertension: Secondary | ICD-10-CM | POA: Diagnosis not present

## 2016-01-15 DIAGNOSIS — E119 Type 2 diabetes mellitus without complications: Secondary | ICD-10-CM | POA: Diagnosis not present

## 2016-01-15 DIAGNOSIS — K219 Gastro-esophageal reflux disease without esophagitis: Secondary | ICD-10-CM | POA: Diagnosis not present

## 2016-02-18 ENCOUNTER — Encounter (HOSPITAL_COMMUNITY): Payer: Self-pay | Admitting: Oncology

## 2016-02-18 ENCOUNTER — Encounter (HOSPITAL_COMMUNITY): Payer: PPO | Attending: Hematology & Oncology | Admitting: Oncology

## 2016-02-18 ENCOUNTER — Ambulatory Visit (HOSPITAL_COMMUNITY): Payer: PPO | Admitting: Oncology

## 2016-02-18 VITALS — BP 155/66 | HR 86 | Temp 97.6°F | Resp 18

## 2016-02-18 DIAGNOSIS — C50312 Malignant neoplasm of lower-inner quadrant of left female breast: Secondary | ICD-10-CM | POA: Diagnosis not present

## 2016-02-18 DIAGNOSIS — Z79899 Other long term (current) drug therapy: Secondary | ICD-10-CM | POA: Insufficient documentation

## 2016-02-18 DIAGNOSIS — C50912 Malignant neoplasm of unspecified site of left female breast: Secondary | ICD-10-CM | POA: Insufficient documentation

## 2016-02-18 NOTE — Assessment & Plan Note (Signed)
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 Arnoldo Morale on 06/26/2015, followed by XRT by Dr. Isidore Moos from 09/10/2015- 10/08/2015, and now on Arimidex beginning on 10/16/2015.  Oncology history is developed.  No labs ordered today and there is no role for them today.  Labs in 3 months: CBC diff, CMET  Mammogram is due in June 2016.  Continue daily Arimidex and compliance is encouraged.  Return in 3 months for follow-up.

## 2016-02-18 NOTE — Patient Instructions (Addendum)
[place] at [facility] Discharge Instructions  RECOMMENDATIONS MADE BY THE CONSULTANT AND ANY TEST RESULTS WILL BE SENT TO YOUR REFERRING PHYSICIAN.  Exam done and seen today by Kirby Crigler Breast exam done today.  Labs look good from before. Continue Arimidex, its very important. Mammogram due in june Return to see the Doctor in 3 months Call the clinic for any concerns or questions.   Thank you for choosing [place] at [facility] to provide your oncology and hematology care.  To afford each patient quality time with our provider, please arrive at least 15 minutes before your scheduled appointment time.   Beginning January 23rd 2017 lab work for the [facility] will be done in the  Main lab at [facility] on 1st floor. If you have a lab appointment with the [place] please come in thru the  Main Entrance and check in at the main information desk  You need to re-schedule your appointment should you arrive 10 or more minutes late.  We strive to give you quality time with our providers, and arriving late affects you and other patients whose appointments are after yours.  Also, if you no show three or more times for appointments you may be dismissed from the clinic at the providers discretion.     Again, thank you for choosing [facility].  Our hope is that these requests will decrease the amount of time that you wait before being seen by our physicians.       _____________________________________________________________  Should you have questions after your visit to [facility], please contact our office at [phone] between the hours of 8:30 a.m. and 4:30 p.m.  Voicemails left after 4:30 p.m. will not be returned until the following business day.  For prescription refill requests, have your pharmacy contact our office.         Resources For Cancer Patients and their Caregivers ? American Cancer  Society: Can assist with transportation, wigs, general needs, runs Look Good Feel Better.        4160344685 ? Cancer Care: Provides financial assistance, online support groups, medication/co-pay assistance.  1-800-813-HOPE (725)337-1501) ? [facility] Assists [place] cancer patients and their families through emotional , educational and financial support.  364-649-2158 ? [place] DSS Where to apply for food stamps, Medicaid and utility assistance. 254-706-7765 ? [facility]: Transportation to medical appointments. 937-568-1001 ? Social Security Administration: May apply for disability if have a Stage IV cancer. 671-857-8365 779-453-3296 ? [employer], Disability and Transit Services: Assists with nutrition, care and transit needs. 531-526-4945

## 2016-02-18 NOTE — Progress Notes (Signed)
Hiram, MD [address] [place] [address]  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 ([place]) - Plan: CBC with Differential, Comprehensive metabolic panel  CURRENT THERAPY: Arimidex beginning on 10/16/2015  INTERVAL HISTORY: [name] 70 y.o. 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 Arnoldo Morale on 06/26/2015, followed by XRT by Dr. Isidore Moos from 09/10/2015- 10/08/2015, and now on Arimidex beginning on 10/16/2015.    Breast cancer ([place]) (Resolved)   08/16/2015 Initial Diagnosis Breast cancer ([employer])   09/11/2015 -  Radiation Therapy Dr. 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 ER+ 100%, PR+ 80%   05/02/2015 Procedure Left breast stereotactic core needle biopsy   06/26/2015 Oncotype testing ONCOTYPE DX assay with recurrence score result of 5, Low risk, 10 year risk of distance recurrence tamoxifen alone 5%   06/26/2015 Definitive Surgery Breast partial mastectomy after needle localization with Dr. Arnoldo Morale no sentinel node performed   06/26/2015 Pathology Results INVASIVE DUCTAL CARCINOMA, 2 CM. - HIGH GRADE DUCTAL CARCINOMA IN SITU. - INVASIVE CARCINOMA FOCALLY LESS THAN 0.1 CM FROM LATERAL MARGIN. - DUCTAL CARCINOMA IN SITU FOCALLY LESS THAN 0.1 CM FROM THE LATERAL MARGIN.   06/26/2015 Pathology Results ER+ 100%, PR + 60%, Ki-67 25%, HER2 NEGATIVE   07/31/2015 Procedure Sentinel node procedure   07/31/2015 Pathology Results ONE BENIGN LYMPH NODE (0/1).   08/26/2015 Imaging Bone density-  Normal   09/10/2015 - 10/08/2015 Radiation Therapy 50 Gy left breast by Dr. Isidore Moos.   10/16/2015 -  Anti-estrogen oral therapy Arimidex    I personally reviewed and went over laboratory results with the patient.  The results are noted within this dictation.  No role for labs today.  Her last mammogram was a unilateral left diagnostic mammogram at [facility] on 08/28/2015.  This was BIRADS 2.  Last mammogram evaluating right breast was on 04/18/2015.  I believe she will be due for mammogram based upon her June 2016 imaging.  She is tolerating AI therapy well without any significant hot flashes, arthralgias, myalgias.  Compliance with Arimidex is encouraged and she confirms compliance.   Past Medical History  Diagnosis Date  . Essential hypertension, benign   . Diabetes mellitus   . Other and unspecified hyperlipidemia   . Nonspecific abnormal finding in stool contents 01/11/2012  . GERD (gastroesophageal reflux disease)   . Arthritis   . Kidney stones   . Hiatal hernia   . Breast cancer ([facility]) 06/26/15    left  . Hepatitis     Hep A as a child  . Disorder of bone and cartilage, unspecified     has Nonspecific abnormal finding in stool contents; Diabetes mellitus without mention of complication; and 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 ([place]) on her problem list.     has No Known Allergies.  Current Outpatient Prescriptions on File Prior to Visit  Medication Sig Dispense Refill  . alendronate (FOSAMAX) 70 MG tablet Take 70 mg by mouth every 7 (seven) days. Take with a full glass of water on an  empty stomach.    Marland Kitchen anastrozole (ARIMIDEX) 1 MG tablet Take 1 tablet (1 mg total) by mouth daily. 90 tablet 1  . calcium carbonate (TUMS - DOSED IN MG ELEMENTAL CALCIUM) 500 MG chewable tablet Chew 1 tablet by mouth daily.    . Cholecalciferol (VITAMIN D-3 PO) Take 1 tablet by mouth 2 (two) times daily. 1000 mg    . losartan-hydrochlorothiazide (HYZAAR) 50-12.5 MG  per tablet Take 1 tablet by mouth daily.    . metFORMIN (GLUCOPHAGE) 1000 MG tablet Take 1,000 mg by mouth 2 (two) times daily with a meal.    . Multiple Vitamins-Calcium (ONE-A-DAY WOMENS PO) Take 1 tablet by mouth daily.    . Omega-3 Fatty Acids (FISH OIL PO) Take 1 capsule by mouth 2 (two) times daily.     Marland Kitchen omeprazole (PRILOSEC) 20 MG capsule Take 20 mg by mouth daily.    Marland Kitchen oxyCODONE-acetaminophen (ROXICET) 5-325 MG per tablet Take 1 tablet by mouth every 4 (four) hours as needed for severe pain. 40 tablet 0  . simvastatin (ZOCOR) 20 MG tablet Take 20 mg by mouth daily.     No current facility-administered medications on file prior to visit.    Past Surgical History  Procedure Laterality Date  . Cataract extraction    . Foot surgery    . Abdominal hysterectomy    . Lithotripsy    . Colonoscopy w/ biopsies and polypectomy    . Open reduction internal fixation (orif) distal radial fracture Left 11/02/2014    Procedure: OPEN REDUCTION INTERNAL FIXATION (ORIF) DISTAL RADIAL FRACTURE;  Surgeon: Charlotte Crumb, MD;  Location: [facility];  Service: Orthopedics;  Laterality: Left;  . Carpal tunnel release Left 11/02/2014    Procedure: CARPAL TUNNEL RELEASE;  Surgeon: Charlotte Crumb, MD;  Location: [facility];  Service: Orthopedics;  Laterality: Left;  . Partial mastectomy with needle localization Left 06/26/2015    Procedure: PARTIAL MASTECTOMY WITH NEEDLE LOCALIZATION;  Surgeon: Aviva Signs, MD;  Location: [facility];  Service: General;  Laterality: Left;  Needle Loc @ 8:00am  . Axillary sentinel node biopsy Left 07/31/2015    Procedure: SENTINEL LYMPH NODE BIOPSY, LEFT AXILLA, ;  Surgeon: Aviva Signs, MD;  Location: [facility];  Service: General;  Laterality: Left;  Sentinel Node @ 1000    Denies any headaches, dizziness, double vision, fevers, chills, night sweats, nausea, vomiting, diarrhea, constipation, chest pain, heart palpitations, shortness of breath, blood in stool, black tarry stool, urinary  pain, urinary burning, urinary frequency, hematuria.   PHYSICAL EXAMINATION  ECOG PERFORMANCE STATUS: 0 - Asymptomatic  Filed Vitals:   02/18/16 1044  BP: 155/66  Pulse: 86  Temp: 97.6 F (36.4 C)  Resp: 18    GENERAL:alert, no distress, well nourished, well developed, comfortable, cooperative, obese, smiling and unaccompanied SKIN: skin color, texture, turgor are normal, no rashes or significant lesions HEAD: Normocephalic, No masses, lesions, tenderness or abnormalities EYES: normal, EOMI, Conjunctiva are pink and non-injected EARS: External ears normal OROPHARYNX:lips, buccal mucosa, and tongue normal and mucous membranes are moist  NECK: supple, thyroid normal size, non-tender, without nodularity, trachea midline LYMPH:  no palpable lymphadenopathy, no hepatosplenomegaly BREAST:right breast normal without mass, skin or nipple changes or axillary nodes, left breast normal without mass, skin or nipple changes or axillary nodes LUNGS: clear to auscultation and percussion HEART: regular rate & rhythm, no murmurs, no gallops, S1 normal and S2 normal ABDOMEN:abdomen soft, non-tender, obese, normal bowel sounds and no masses or organomegaly BACK: Back symmetric, no curvature.,  No CVA tenderness EXTREMITIES:less then 2 second capillary refill, no joint deformities, effusion, or inflammation, no edema, no skin discoloration, no clubbing, no cyanosis  NEURO: alert & oriented x 3 with fluent speech, no focal motor/sensory deficits, gait normal   LABORATORY DATA: CBC    Component Value Date/Time   WBC 5.8 11/20/2015 0938   RBC 4.53 11/20/2015 0938   HGB 11.6* 11/20/2015 0938   HCT 37.3 11/20/2015 0938   PLT 206 11/20/2015 0938   MCV 82.3 11/20/2015 0938   MCH 25.6* 11/20/2015 0938   MCHC 31.1 11/20/2015 0938   RDW 16.0* 11/20/2015 0938   LYMPHSABS 1.1 11/20/2015 0938   MONOABS 0.4 11/20/2015 0938   EOSABS 0.0 11/20/2015 0938   BASOSABS 0.0 11/20/2015 0938      Chemistry       Component Value Date/Time   NA 143 11/20/2015 0938   K 3.8 11/20/2015 0938   CL 104 11/20/2015 0938   CO2 29 11/20/2015 0938   BUN 18 11/20/2015 0938   CREATININE 0.90 11/20/2015 0938      Component Value Date/Time   CALCIUM 9.5 11/20/2015 0938   ALKPHOS 84 11/20/2015 0938   AST 27 11/20/2015 0938   ALT 29 11/20/2015 0938   BILITOT 0.3 11/20/2015 0938       PENDING LABS:   RADIOGRAPHIC STUDIES:  No results found.   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 Arnoldo Morale on 06/26/2015, followed by XRT by Dr. Isidore Moos from 09/10/2015- 10/08/2015, and now on Arimidex beginning on 10/16/2015.  Oncology history is developed.  No labs ordered today and there is no role for them today.  Labs in 3 months: CBC diff, CMET  Mammogram is due in June 2016.  Continue daily Arimidex and compliance is encouraged.  Return in 3 months for follow-up.    THERAPY PLAN:  NCCN guidelines recommends the following surveillance for invasive breast cancer (2.2016):  A. History and Physical exam 1-4 times per year as clinically appropriate for 5 years, then annually.  B. Periodic screening for changes in family history and referral to genetics counseling as indicated  C. Educate, monitor, and refer to lymphedema management.  D. Mammography every 12 months  E. Routine imaging of reconstructed breast is not indicated.  F. In the absence of clinical signs and symptoms suggestive of recurrent disease, there is no indication for laboratory or imaging studies for metastases screening.  G. Women on Tamoxifen: annual gynecologic assessment every 12 months if uterus is present.  H. Women on aromatase inhibitor or who experience ovarian failure secondary to treatment should have monitoring of bone  health with a bone mineral density determination at baseline and periodically thereafter.  I. Assess and encourage adherence to adjuvant endocrine therapy.  J. Evidence suggests that active lifestyle, healthy diet, limited alcohol intake, and achieving and maintaining an ideal body weight (20-25 BMI) may lead to optimal breast cancer outcomes.   All questions were answered. The patient knows to call the clinic with any problems, questions or concerns. We can certainly see the patient much sooner if necessary.  Patient and plan discussed with Dr. Ancil Linsey and she is in agreement with the aforementioned.   This note is electronically signed by: Doy Mince 02/18/2016 5:16 PM

## 2016-03-05 DIAGNOSIS — I10 Essential (primary) hypertension: Secondary | ICD-10-CM | POA: Diagnosis not present

## 2016-03-05 DIAGNOSIS — E119 Type 2 diabetes mellitus without complications: Secondary | ICD-10-CM | POA: Diagnosis not present

## 2016-03-05 DIAGNOSIS — E782 Mixed hyperlipidemia: Secondary | ICD-10-CM | POA: Diagnosis not present

## 2016-04-29 ENCOUNTER — Encounter: Payer: Self-pay | Admitting: Hematology & Oncology

## 2016-04-29 DIAGNOSIS — R921 Mammographic calcification found on diagnostic imaging of breast: Secondary | ICD-10-CM | POA: Diagnosis not present

## 2016-04-29 DIAGNOSIS — Z923 Personal history of irradiation: Secondary | ICD-10-CM | POA: Diagnosis not present

## 2016-04-29 DIAGNOSIS — Z9889 Other specified postprocedural states: Secondary | ICD-10-CM | POA: Diagnosis not present

## 2016-04-29 DIAGNOSIS — Z853 Personal history of malignant neoplasm of breast: Secondary | ICD-10-CM | POA: Diagnosis not present

## 2016-04-30 ENCOUNTER — Other Ambulatory Visit (HOSPITAL_COMMUNITY): Payer: Self-pay | Admitting: Hematology & Oncology

## 2016-04-30 ENCOUNTER — Other Ambulatory Visit (HOSPITAL_COMMUNITY): Payer: Self-pay | Admitting: Family Medicine

## 2016-04-30 DIAGNOSIS — N6320 Unspecified lump in the left breast, unspecified quadrant: Secondary | ICD-10-CM

## 2016-05-06 ENCOUNTER — Ambulatory Visit
Admission: RE | Admit: 2016-05-06 | Discharge: 2016-05-06 | Disposition: A | Discharge status: Home / Self Care | Payer: PPO | Source: Ambulatory Visit | Attending: Hematology & Oncology | Admitting: Hematology & Oncology

## 2016-05-06 ENCOUNTER — Other Ambulatory Visit: Payer: Self-pay | Admitting: Radiation Oncology

## 2016-05-06 ENCOUNTER — Ambulatory Visit
Admission: RE | Admit: 2016-05-06 | Discharge: 2016-05-06 | Disposition: A | Discharge status: Home / Self Care | Payer: PPO | Source: Ambulatory Visit | Attending: Radiation Oncology | Admitting: Radiation Oncology

## 2016-05-06 DIAGNOSIS — Z853 Personal history of malignant neoplasm of breast: Secondary | ICD-10-CM

## 2016-05-06 DIAGNOSIS — N6012 Diffuse cystic mastopathy of left breast: Secondary | ICD-10-CM | POA: Diagnosis not present

## 2016-05-06 DIAGNOSIS — N6320 Unspecified lump in the left breast, unspecified quadrant: Secondary | ICD-10-CM

## 2016-05-06 DIAGNOSIS — R921 Mammographic calcification found on diagnostic imaging of breast: Secondary | ICD-10-CM | POA: Diagnosis not present

## 2016-05-06 DIAGNOSIS — N641 Fat necrosis of breast: Secondary | ICD-10-CM | POA: Diagnosis not present

## 2016-05-21 DIAGNOSIS — C50912 Malignant neoplasm of unspecified site of left female breast: Secondary | ICD-10-CM | POA: Diagnosis not present

## 2016-05-26 ENCOUNTER — Other Ambulatory Visit (HOSPITAL_COMMUNITY): Payer: Self-pay | Admitting: General Surgery

## 2016-05-26 DIAGNOSIS — R921 Mammographic calcification found on diagnostic imaging of breast: Secondary | ICD-10-CM | POA: Diagnosis not present

## 2016-05-26 DIAGNOSIS — C50912 Malignant neoplasm of unspecified site of left female breast: Secondary | ICD-10-CM

## 2016-05-26 NOTE — H&P (Signed)
  NTS SOAP Note  Vital Signs:  Vitals as of: 0000000: Systolic [id number]: Diastolic 83: Heart Rate 92: Temp 18F (Temporal): Height 24ft 5in: Weight 191Lbs 0 Ounces: Pain Level 3: BMI 31.78   BMI : 31.78 kg/m2  Subjective: This 70 year old female presents for of a discordant left breast biopsy.  s/p left partial mastectomy/sentinel lymph node biopsy 2016, being treated with anastrezole for breast cancer, followed by Oncology who recently underwent left breast core biopsy x 2.  Was found to have a discordant left breast path and is referred for possible left breast biopsy.  No nipple discharge noted.  Patient is asymptomatic.  Review of Symptoms:  Constitutional:negative Head:negative Eyes:negative Nose/Mouth/Throat:negative Cardiovascular:negative Respiratory:negative Gastrointestinnegative Genitourinary:negative joint pain Skin:negative as above Hematolgic/Lymphatic:negative Allergic/Immunologic:negative   Past Medical History:Reviewed  Past Medical History  Surgical History: left partial mastectomy, sentinel lymph node biopsy 2016 Medical Problems: high cholesterol, NIDDM, left breast cancer Allergies: nkda Medications: anastrozole, metformin, omeprazole, simvastatin   Social History:Reviewed  Social History  Preferred Language: English Race:  White Ethnicity: Not Hispanic / Latino Age: 29 year Marital Status:  S Alcohol: no   Smoking Status: Never smoker reviewed on 05/21/2016 Functional Status reviewed on 05/21/2016 ------------------------------------------------ Bathing: Normal Cooking: Normal Dressing: Normal Driving: Normal Eating: Normal Managing Meds: Normal Oral Care: Normal Shopping: Normal Toileting: Normal Transferring: Normal Walking: Normal Cognitive Status reviewed on 05/21/2016 ------------------------------------------------ Attention: Normal Decision Making: Normal Language: Normal Memory: Normal Motor:  Normal Perception: Normal Problem Solving: Normal Visual and Spatial: Normal   Family History:Reviewed  Family Health History Mother, Healthy;  Father, Healthy;     Objective Information: General:Well appearing, well nourished in no distress. Skin:no rash or prominent lesions Head:Atraumatic; no masses; no abnormalities Neck:Supple without lymphadenopathy.  Heart:RRR, no murmur or gallop.  Normal S1, S2.  No S3, S4.  Lungs:CTA bilaterally, no wheezes, rhonchi, rales.  Breathing unlabored. No dominant mass, nipple discharge, dimpling.  Axilla negative. Path and mammogram reports reviewed Assessment:left breast microcalcification, discordant results, personal h/o left breast carcinoma  Diagnoses: 174.9  C50.912 Primary malignant neoplasm of female breast (Malignant neoplasm of unspecified site of left female breast)  Procedures: 99214 - OFFICE OUTPATIENT VISIT 25 MINUTES    Plan:  Patient is scheduled for left breast biopsy after needle localization on 06/09/2016. The risks and benefits of the procedure including bleeding and infection were fully explained to the patient, who gave informed consent.

## 2016-05-29 ENCOUNTER — Encounter (HOSPITAL_COMMUNITY): Payer: PPO

## 2016-05-29 ENCOUNTER — Encounter (HOSPITAL_COMMUNITY): Payer: PPO | Attending: Hematology & Oncology | Admitting: Oncology

## 2016-05-29 ENCOUNTER — Encounter (HOSPITAL_COMMUNITY): Payer: Self-pay | Admitting: Oncology

## 2016-05-29 VITALS — BP 170/72 | HR 77 | Temp 97.9°F | Resp 16 | Wt 189.2 lb

## 2016-05-29 DIAGNOSIS — C50312 Malignant neoplasm of lower-inner quadrant of left female breast: Secondary | ICD-10-CM

## 2016-05-29 DIAGNOSIS — C50912 Malignant neoplasm of unspecified site of left female breast: Secondary | ICD-10-CM | POA: Diagnosis not present

## 2016-05-29 DIAGNOSIS — Z79899 Other long term (current) drug therapy: Secondary | ICD-10-CM | POA: Diagnosis not present

## 2016-05-29 DIAGNOSIS — Z170 Estrogen receptor positive status [ER+]: Secondary | ICD-10-CM

## 2016-05-29 DIAGNOSIS — Z79811 Long term (current) use of aromatase inhibitors: Secondary | ICD-10-CM

## 2016-05-29 LAB — CBC WITH DIFFERENTIAL/PLATELET
Basophils Absolute: 0 10*3/uL (ref 0.0–0.1)
Basophils Relative: 0 %
Eosinophils Absolute: 0 10*3/uL (ref 0.0–0.7)
Eosinophils Relative: 1 %
HEMATOCRIT: 38.7 % (ref 36.0–46.0)
Hemoglobin: 12.6 g/dL (ref 12.0–15.0)
LYMPHS ABS: 1.1 10*3/uL (ref 0.7–4.0)
Lymphocytes Relative: 20 %
MCH: 28.1 pg (ref 26.0–34.0)
MCHC: 32.6 g/dL (ref 30.0–36.0)
MCV: 86.2 fL (ref 78.0–100.0)
MONO ABS: 0.5 10*3/uL (ref 0.1–1.0)
Monocytes Relative: 10 %
NEUTROS ABS: 3.7 10*3/uL (ref 1.7–7.7)
Neutrophils Relative %: 69 %
Platelets: 183 10*3/uL (ref 150–400)
RBC: 4.49 MIL/uL (ref 3.87–5.11)
RDW: 15.3 % (ref 11.5–15.5)
WBC: 5.3 10*3/uL (ref 4.0–10.5)

## 2016-05-29 LAB — COMPREHENSIVE METABOLIC PANEL
ALT: 27 U/L (ref 14–54)
AST: 27 U/L (ref 15–41)
Albumin: 3.9 g/dL (ref 3.5–5.0)
Alkaline Phosphatase: 73 U/L (ref 38–126)
Anion gap: 10 (ref 5–15)
BILIRUBIN TOTAL: 0.4 mg/dL (ref 0.3–1.2)
BUN: 16 mg/dL (ref 6–20)
CHLORIDE: 102 mmol/L (ref 101–111)
CO2: 29 mmol/L (ref 22–32)
CREATININE: 0.85 mg/dL (ref 0.44–1.00)
Calcium: 9.7 mg/dL (ref 8.9–10.3)
GFR calc Af Amer: >60 mL/min (ref >60)
Glucose, Bld: 132 mg/dL — ABNORMAL HIGH (ref 65–99)
POTASSIUM: 4 mmol/L (ref 3.5–5.1)
Sodium: 141 mmol/L (ref 135–145)
Total Protein: 6.9 g/dL (ref 6.5–8.1)

## 2016-05-29 MED ORDER — ANASTROZOLE 1 MG PO TABS: 1.0000 mg | ORAL_TABLET | Freq: Every day | ORAL | Status: DC

## 2016-05-29 NOTE — Assessment & Plan Note (Addendum)
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 Arnoldo Morale on 06/26/2015, followed by XRT by Dr. Isidore Moos from 09/10/2015- 10/08/2015, and now on Arimidex beginning on 10/16/2015.  Oncology history is updated.  Labs today: CBC diff, CMET.  I personally reviewed and went over laboratory results with the patient.  The results are noted within this dictation.  Labs are WNL today.  I personally reviewed and went over radiographic studies with the patient.  The results are noted within this dictation.  She underwent left diagnostic mammogram in June 2017 which lead to a stereotactic biopsy demonstrating calcifications without malignancy.  She is scheduled to undergo excision by Dr. Arnoldo Morale on 06/09/2016.    Her BP is up today.  She admits that she forgot her antihypertensive medication this AM.  She is asymptomatic.  Continue daily Arimidex and compliance is encouraged.  I have refilled her Arimidex and escribed it to her mail-in pharmacy.  Return in 3-4 weeks to review pathology and provide further medical oncology recommendations.

## 2016-05-29 NOTE — Progress Notes (Signed)
[place], [place] [address] [place] [address]  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 ([place]) - Plan: anastrozole (ARIMIDEX) 1 MG tablet  CURRENT THERAPY: Arimidex beginning on 10/16/2015  INTERVAL HISTORY: [name] 70 y.o. 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 Arnoldo Morale on 06/26/2015, followed by XRT by Dr. Isidore Moos from 09/10/2015- 10/08/2015, and now on Arimidex beginning on 10/16/2015.    Breast cancer ([place]) (Resolved)   08/16/2015 Initial Diagnosis Breast cancer ([place])   09/11/2015 -  Radiation Therapy Dr. 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 ER+ 100%, PR+ 80%   05/02/2015 Procedure Left breast stereotactic core needle biopsy   06/26/2015 Oncotype testing ONCOTYPE DX assay with recurrence score result of 5, Low risk, 10 year risk of distance recurrence tamoxifen alone 5%   06/26/2015 Definitive Surgery Breast partial mastectomy after needle localization with Dr. Arnoldo Morale no sentinel node performed   06/26/2015 Pathology Results INVASIVE DUCTAL CARCINOMA, 2 CM. - HIGH GRADE DUCTAL CARCINOMA IN SITU. - INVASIVE CARCINOMA FOCALLY LESS THAN 0.1 CM FROM LATERAL MARGIN. - DUCTAL CARCINOMA IN SITU FOCALLY LESS THAN 0.1 CM FROM THE LATERAL MARGIN.   06/26/2015 Pathology Results ER+ 100%, PR + 60%, Ki-67 25%, HER2 NEGATIVE   07/31/2015 Procedure Sentinel node procedure   07/31/2015 Pathology Results ONE BENIGN LYMPH NODE (0/1).   08/26/2015 Imaging Bone density- Normal   09/10/2015  - 10/08/2015 Radiation Therapy 50 Gy left breast by Dr. Isidore Moos.   10/16/2015 -  Anti-estrogen oral therapy Arimidex   05/08/2016 Procedure Stereotactic-guided biopsies of the calcifications of left breast.   05/08/2016 Pathology Results Breast, left, needle core biopsy, lateral and ant to lumpectomy site FIBROCYSTIC CHANGES VASCULAR CALCIFICATIONS NO EVIDENCE OF MALIGNANCY   05/08/2016 Pathology Results 2. Breast, left, needle core biopsy, lumpectomy site FAT NECROSIS WITH FIBROSIS AND MICROCALCIFICATIONS NO EVIDENCE OF MALIGNANCY   She is doing well and tolerating Arimidex without any complaints.  She underwent mammogram with biopsy demonstrating calcifications in the left breast.  She is scheduled for excision by Dr. Arnoldo Morale in ~ 10 days.    Review of Systems  Constitutional: Negative.  Negative for fever, chills and weight loss.  HENT: Negative.   Eyes: Negative.   Respiratory: Negative.   Cardiovascular: Negative.   Gastrointestinal: Negative.  Negative for nausea, vomiting, abdominal pain, diarrhea and constipation.  Genitourinary: Negative.   Musculoskeletal: Negative.  Negative for myalgias and joint pain.  Skin: Negative.   Neurological: Negative.   Endo/Heme/Allergies: Negative.   Psychiatric/Behavioral: Negative.     Past Medical History  Diagnosis Date  . Essential hypertension, benign   . Diabetes mellitus   . Other and unspecified hyperlipidemia   . Nonspecific abnormal finding in stool contents 01/11/2012  . GERD (gastroesophageal reflux disease)   . Arthritis   . Kidney stones   . Hiatal hernia   . Breast cancer ([facility]) 06/26/15    left  . Hepatitis     Hep A as a child  . Disorder of bone and cartilage, unspecified  Past Surgical History  Procedure Laterality Date  . Cataract extraction    . Foot surgery    . Abdominal hysterectomy    . Lithotripsy    . Colonoscopy w/ biopsies and polypectomy    . Open reduction internal fixation (orif) distal radial  fracture Left 11/02/2014    Procedure: OPEN REDUCTION INTERNAL FIXATION (ORIF) DISTAL RADIAL FRACTURE;  Surgeon: Charlotte Crumb, MD;  Location: [facility];  Service: Orthopedics;  Laterality: Left;  . Carpal tunnel release Left 11/02/2014    Procedure: CARPAL TUNNEL RELEASE;  Surgeon: Charlotte Crumb, MD;  Location: [facility];  Service: Orthopedics;  Laterality: Left;  . Partial mastectomy with needle localization Left 06/26/2015    Procedure: PARTIAL MASTECTOMY WITH NEEDLE LOCALIZATION;  Surgeon: Aviva Signs, MD;  Location: [facility];  Service: General;  Laterality: Left;  Needle Loc @ 8:00am  . Axillary sentinel node biopsy Left 07/31/2015    Procedure: SENTINEL LYMPH NODE BIOPSY, LEFT AXILLA, ;  Surgeon: Aviva Signs, MD;  Location: [facility];  Service: General;  Laterality: Left;  Sentinel Node @ 1000    Family History  Problem Relation Age of Onset  . Hypertension Mother   . Osteoporosis Mother   . Diabetes Sister     x3  . Colon cancer Neg Hx     Social History   Social History  . Marital Status: Married    Spouse Name: N/A  . Number of Children: 2  . Years of Education: N/A   Occupational History  . Retired    Social History Main Topics  . Smoking status: Former Research scientist (life sciences)  . Smokeless tobacco: Never Used  . Alcohol Use: No  . Drug Use: No  . Sexual Activity: Not Asked   Other Topics Concern  . None   Social History Narrative     PHYSICAL EXAMINATION  ECOG PERFORMANCE STATUS: 0 - Asymptomatic  Filed Vitals:   05/29/16 0906  BP: 170/72  Pulse: 77  Temp: 97.9 F (36.6 C)  Resp: 16    GENERAL:alert, no distress, well nourished, well developed, comfortable, cooperative, obese, smiling and unaccompanied SKIN: skin color, texture, turgor are normal, no rashes or significant lesions HEAD: Normocephalic, No masses, lesions, tenderness or abnormalities EYES: normal, EOMI, Conjunctiva are pink and non-injected EARS: External ears normal OROPHARYNX:lips, buccal mucosa, and  tongue normal and mucous membranes are moist  NECK: supple, trachea midline LYMPH:  not examined BREAST:not examined LUNGS: clear to auscultation  HEART: regular rate & rhythm ABDOMEN:abdomen soft, non-tender and normal bowel sounds BACK: Back symmetric, no curvature. EXTREMITIES:less then 2 second capillary refill, no joint deformities, effusion, or inflammation, no skin discoloration  NEURO: alert & oriented x 3 with fluent speech, no focal motor/sensory deficits, gait normal   LABORATORY DATA: CBC    Component Value Date/Time   WBC 5.3 05/29/2016 0815   RBC 4.49 05/29/2016 0815   HGB 12.6 05/29/2016 0815   HCT 38.7 05/29/2016 0815   PLT 183 05/29/2016 0815   MCV 86.2 05/29/2016 0815   MCH 28.1 05/29/2016 0815   MCHC 32.6 05/29/2016 0815   RDW 15.3 05/29/2016 0815   LYMPHSABS 1.1 05/29/2016 0815   MONOABS 0.5 05/29/2016 0815   EOSABS 0.0 05/29/2016 0815   BASOSABS 0.0 05/29/2016 0815      Chemistry      Component Value Date/Time   NA 141 05/29/2016 0815   K 4.0 05/29/2016 0815   CL 102 05/29/2016 0815   CO2 29 05/29/2016 0815   BUN 16 05/29/2016  0815   CREATININE 0.85 05/29/2016 0815      Component Value Date/Time   CALCIUM 9.7 05/29/2016 0815   ALKPHOS 73 05/29/2016 0815   AST 27 05/29/2016 0815   ALT 27 05/29/2016 0815   BILITOT 0.4 05/29/2016 0815        PENDING LABS:   RADIOGRAPHIC STUDIES:  Mm Digital Diagnostic Unilat L  05/06/2016  CLINICAL DATA:  Status post stereotactic core biopsies of calcifications in the left breast EXAM: DIAGNOSTIC LEFT MAMMOGRAM POST STEREOTACTIC BIOPSY COMPARISON:  Previous exam(s). FINDINGS: Mammographic images were obtained following stereotactic guided biopsy of calcifications lateral and anterior to the lumpectomy site and calcifications at the lumpectomy site. Cc and lateral views of the left breast demonstrate coil biopsy clip in the area of concern of the calcifications lateral and anterior to the lumpectomy site.  There is a X shaped clip at the area of concern of prior left lumpectomy site. IMPRESSION: Post biopsy mammogram demonstrating biopsy clips in areas of concern. Final Assessment: Post Procedure Mammograms for Marker Placement Electronically Signed   By: Abelardo Diesel M.D.   On: 05/06/2016 11:55   Mm Lt Breast Bx W Loc Dev 1st Lesion Image Bx Spec Stereo Guide  05/08/2016  ADDENDUM REPORT: 05/08/2016 07:54 ADDENDUM: Pathology revealed FIBROCYSTIC CHANGES, VASCULAR CALCIFICATIONS of the Left breast, lateral and anterior to lumpectomy site, with excision recommended (coil clip). This was found to be discordant by Dr. Abelardo Diesel. Pathology revealed FAT NECROSIS WITH FIBROSIS AND MICROCALCIFICATIONS of the Left breast lumpectomy site (x clip). This was found to be concordant by Dr. Abelardo Diesel. Pathology results were discussed with the patient by telephone. The patient reported doing well after the biopsies with tenderness at the sites. Post biopsy instructions and care were reviewed and questions were answered. The patient was encouraged to call [facility] for any additional concerns. Surgical consultation has been arranged with Dr. Aviva Signs at [facility] in [place], [place] on May 21, 2016. Pathology results reported by Terie Purser, RN on 05/08/2016. Electronically Signed   By: Abelardo Diesel M.D.   On: 05/08/2016 07:54  05/08/2016  CLINICAL DATA:  Abnormal calcifications in the left breast for biopsy EXAM: LEFT BREAST STEREOTACTIC CORE NEEDLE BIOPSY COMPARISON:  Previous exams. FINDINGS: The patient and I discussed the procedure of stereotactic-guided biopsy including benefits and alternatives. We discussed the high likelihood of a successful procedure. We discussed the risks of the procedure including infection, bleeding, tissue injury, clip migration, and inadequate sampling. Informed written consent was given. The usual time out protocol was performed  immediately prior to the procedure. Using sterile technique and 1% Lidocaine as local anesthetic, under stereotactic guidance, a 9 gauge stereotactic vacuum assisted device was used to perform core needle biopsy of calcifications lateral and anterior to the left lumpectomy site label #1 using a cranial approach. Specimen radiograph was performed showing inclusion of calcifications of concern. Specimens with calcifications are identified for pathology. A coil clip was placed. Using sterile technique and 1% Lidocaine as local anesthetic, under stereotactic guidance, a 9 gauge stereotactic vacuum assisted device was used to perform core needle biopsy of calcifications at left lumpectomy site labral #2 using a lateral approach. Specimen radiograph was performed showing inclusion of calcifications of concern. Specimens with calcifications are identified for pathology. A X shaped clip was placed Follow-up 2-view mammogram was performed and dictated separately. IMPRESSION: Stereotactic-guided biopsies of the calcifications of left breast. No apparent complications. Electronically Signed: By: [place] Meth  Augustin Coupe M.D. On: 05/06/2016 11:53   Mm Lt Breast Bx W Loc Dev Ea Ad Lesion Img Bx Spec Stereo Guide  05/08/2016  ADDENDUM REPORT: 05/08/2016 07:54 ADDENDUM: Pathology revealed FIBROCYSTIC CHANGES, VASCULAR CALCIFICATIONS of the Left breast, lateral and anterior to lumpectomy site, with excision recommended (coil clip). This was found to be discordant by Dr. Abelardo Diesel. Pathology revealed FAT NECROSIS WITH FIBROSIS AND MICROCALCIFICATIONS of the Left breast lumpectomy site (x clip). This was found to be concordant by Dr. Abelardo Diesel. Pathology results were discussed with the patient by telephone. The patient reported doing well after the biopsies with tenderness at the sites. Post biopsy instructions and care were reviewed and questions were answered. The patient was encouraged to call [facility]  for any additional concerns. Surgical consultation has been arranged with Dr. Aviva Signs at [facility] in [place], [place] on May 21, 2016. Pathology results reported by Terie Purser, RN on 05/08/2016. Electronically Signed   By: Abelardo Diesel M.D.   On: 05/08/2016 07:54  05/08/2016  CLINICAL DATA:  Abnormal calcifications in the left breast for biopsy EXAM: LEFT BREAST STEREOTACTIC CORE NEEDLE BIOPSY COMPARISON:  Previous exams. FINDINGS: The patient and I discussed the procedure of stereotactic-guided biopsy including benefits and alternatives. We discussed the high likelihood of a successful procedure. We discussed the risks of the procedure including infection, bleeding, tissue injury, clip migration, and inadequate sampling. Informed written consent was given. The usual time out protocol was performed immediately prior to the procedure. Using sterile technique and 1% Lidocaine as local anesthetic, under stereotactic guidance, a 9 gauge stereotactic vacuum assisted device was used to perform core needle biopsy of calcifications lateral and anterior to the left lumpectomy site label #1 using a cranial approach. Specimen radiograph was performed showing inclusion of calcifications of concern. Specimens with calcifications are identified for pathology. A coil clip was placed. Using sterile technique and 1% Lidocaine as local anesthetic, under stereotactic guidance, a 9 gauge stereotactic vacuum assisted device was used to perform core needle biopsy of calcifications at left lumpectomy site labral #2 using a lateral approach. Specimen radiograph was performed showing inclusion of calcifications of concern. Specimens with calcifications are identified for pathology. A X shaped clip was placed Follow-up 2-view mammogram was performed and dictated separately. IMPRESSION: Stereotactic-guided biopsies of the calcifications of left breast. No apparent complications. Electronically Signed: By: Abelardo Diesel M.D. 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 Arnoldo Morale on 06/26/2015, followed by XRT by Dr. Isidore Moos from 09/10/2015- 10/08/2015, and now on Arimidex beginning on 10/16/2015.  Oncology history is updated.  Labs today: CBC diff, CMET.  I personally reviewed and went over laboratory results with the patient.  The results are noted within this dictation.  Labs are WNL today.  I personally reviewed and went over radiographic studies with the patient.  The results are noted within this dictation.  She underwent left diagnostic mammogram in June 2017 which lead to a stereotactic biopsy demonstrating calcifications without malignancy.  She is scheduled to undergo excision by Dr. Arnoldo Morale on 06/09/2016.    Her BP is up today.  She admits that she forgot her antihypertensive medication this AM.  She is asymptomatic.  Continue daily Arimidex and compliance is encouraged.  I  have refilled her Arimidex and escribed it to her mail-in pharmacy.  Return in 3-4 weeks to review pathology and provide further medical oncology recommendations.    ORDERS PLACED FOR THIS ENCOUNTER: No orders of the defined types were placed in this encounter.    MEDICATIONS PRESCRIBED THIS ENCOUNTER: Meds ordered this encounter  Medications  . anastrozole (ARIMIDEX) 1 MG tablet    Sig: Take 1 tablet (1 mg total) by mouth daily.    Dispense:  90 tablet    Refill:  1    Order Specific Question:  Supervising Provider    Answer:  Patrici Ranks [id number]    THERAPY PLAN:  NCCN guidelines recommends the following surveillance for invasive breast cancer (2.2017):  A. History and Physical exam 1-4 times per year as clinically appropriate for 5 years, then annually.  B. Periodic screening  for changes in family history and referral to genetics counseling as indicated  C. Educate, monitor, and refer to lymphedema management.  D. Mammography every 12 months  E. Routine imaging of reconstructed breast is not indicated.  F. In the absence of clinical signs and symptoms suggestive of recurrent disease, there is no indication for laboratory or imaging studies for metastases screening.  G. Women on Tamoxifen: annual gynecologic assessment every 12 months if uterus is present.  H. Women on aromatase inhibitor or who experience ovarian failure secondary to treatment should have monitoring of bone health with a bone mineral density determination at baseline and periodically thereafter.  I. Assess and encourage adherence to adjuvant endocrine therapy.  J. Evidence suggests that active lifestyle, healthy diet, limited alcohol intake, and achieving and maintaining an ideal body weight (20-25 BMI) may lead to optimal breast cancer outcomes.   All questions were answered. The patient knows to call the clinic with any problems, questions or concerns. We can certainly see the patient much sooner if necessary.  Patient and plan discussed with Dr. Ancil Linsey and she is in agreement with the aforementioned.   This note is electronically signed by: Doy Mince 05/29/2016 3:14 PM

## 2016-05-29 NOTE — Patient Instructions (Signed)
[place] at [facility] Discharge Instructions  RECOMMENDATIONS MADE BY THE CONSULTANT AND ANY TEST RESULTS WILL BE SENT TO YOUR REFERRING PHYSICIAN.  You saw Dr. Gershon Mussel today.  Arimidex was refilled for a 90 day supply and sent to your pharmacy. Surgery as planned with Dr. Arnoldo Morale. Return in middle to end of August for follow up.  Thank you for choosing [place] at [facility] to provide your oncology and hematology care.  To afford each patient quality time with our provider, please arrive at least 15 minutes before your scheduled appointment time.   Beginning January 23rd 2017 lab work for the [facility] will be done in the  Main lab at [facility] on 1st floor. If you have a lab appointment with the [facility] please come in thru the  Main Entrance and check in at the main information desk  You need to re-schedule your appointment should you arrive 10 or more minutes late.  We strive to give you quality time with our providers, and arriving late affects you and other patients whose appointments are after yours.  Also, if you no show three or more times for appointments you may be dismissed from the clinic at the providers discretion.     Again, thank you for choosing [facility].  Our hope is that these requests will decrease the amount of time that you wait before being seen by our physicians.       _____________________________________________________________  Should you have questions after your visit to [facility], please contact our office at [phone] between the hours of 8:30 a.m. and 4:30 p.m.  Voicemails left after 4:30 p.m. will not be returned until the following business day.  For prescription refill requests, have your pharmacy contact our office.         Resources For Cancer Patients and their Caregivers ? American Cancer Society: Can assist with transportation, wigs, general needs,  runs Look Good Feel Better.        920-611-6644 ? Cancer Care: Provides financial assistance, online support groups, medication/co-pay assistance.  1-800-813-HOPE 980-599-3794) ? [place] Assists [place] cancer patients and their families through emotional , educational and financial support.  8181577817 ? [place] DSS Where to apply for food stamps, Medicaid and utility assistance. 7097591860 ? [facility]: Transportation to medical appointments. 661-013-7594 ? Social Security Administration: May apply for disability if have a Stage IV cancer. 857-154-3926 9062036733 ? [employer], Disability and Transit Services: Assists with nutrition, care and transit needs. [place] Support Programs: @10RELATIVEDAYS @ > Cancer Support Group  2nd Tuesday of the month 1pm-2pm, Journey Room  > Creative Journey  3rd Tuesday of the month 1130am-1pm, Journey Room  > Look Good Feel Better  1st Wednesday of the month 10am-12 noon, Journey Room (Call [place] to register [phone])

## 2016-06-04 NOTE — Patient Instructions (Signed)
ZERITA STARNS  06/04/2016     @PREFPERIOPPHARMACY @   Your procedure is scheduled on  06/09/2016 .  Report to [facility] at  915  A.M.  Call this number if you have problems the morning of surgery:  [phone]   Remember:  Do not eat food or drink liquids after midnight.  Take these medicines the morning of surgery with A SIP OF WATER  Armidex, losartan, prilosec.   Do not wear jewelry, make-up or nail polish.  Do not wear lotions, powders, or perfumes.  You may wear deoderant.  Do not shave 48 hours prior to surgery.  Men may shave face and neck.  Do not bring valuables to the hospital.  [facility] is not responsible for any belongings or valuables.  Contacts, dentures or bridgework may not be worn into surgery.  Leave your suitcase in the car.  After surgery it may be brought to your room.  For patients admitted to the hospital, discharge time will be determined by your treatment team.  Patients discharged the day of surgery will not be allowed to drive home.   Name and phone number of your driver:   family Special instructions:  none  Please read over the following fact sheets that you were given. Coughing and Deep Breathing, Surgical Site Infection Prevention, Anesthesia Post-op Instructions and Care and Recovery After Surgery      Breast Biopsy A breast biopsy is a test during which a sample of tissue is taken from your breast. The breast tissue is looked at under a microscope for cancer cells.  BEFORE THE PROCEDURE  Make plans to have someone drive you home after the test.  Do not smoke for 2 weeks before the test. Stop smoking, if you smoke.  Do not drink alcohol for 24 hours before the test.  Wear a good support bra to the test.  Your doctor may do a procedure to place a wire or a seed that gives off radiation in the breast lump. The wire or seed will help your doctor see the lump when doing the biopsy. PROCEDURE  You may be given one of the  following:  A medicine to numb the breast area (local anesthetic).  A medicine to make you fall asleep (general anesthetic). There are different types of breast biopsies. They include:  Fine-needle aspiration.  A needle is put into the breast lump.  The needle takes out fluid and cells from the lump.  Ultrasound imaging may be used to help find the lump and to put the needle in the right spot.  Core-needle biopsy.  A needle is put into the breast lump.  The needle is put in your breast 3-6 times.  The needle removes breast tissue.  An ultrasound image or X-ray is often used to find the right spot to put in the needle.  Stereotactic biopsy.  X-rays and a computer are used to study X-ray pictures of the breast lump.  The computer finds where the needle needs to be put into the breast.  Tissue samples are taken out.  Vacuum-assisted biopsy.  A small cut (incision) is made in your breast.  A biopsy device is put through the cut and into the breast tissue.  The biopsy device draws abnormal breast tissue into the biopsy device.  A large tissue sample is often removed.  No stitches are needed.  Ultrasound-guided core-needle biopsy.  Ultrasound imaging helps guide the needle into the  area of the breast that is not normal.  A cut is made in the breast. The needle is put into the breast lump.  Tissue samples are taken out.  Open biopsy.  A large cut is made in the breast.  Your doctor will try to remove the whole breast lump or as much as possible. All tissue, fluid, or cell samples are looked at under a microscope.  AFTER THE PROCEDURE  You will be taken to an area to recover. You will be able to go home once you are doing well and are without problems.  You may have bruising on your breast. This is normal.  A pressure bandage (dressing) may be put on your breast for 24-48 hours. This type of bandage is wrapped tightly around your chest. It helps stop fluid  from building up underneath tissues.   This information is not intended to replace advice given to you by your health care provider. Make sure you discuss any questions you have with your health care provider.   Document Released: 01/25/2012 Document Revised: 07/24/2015 Document Reviewed: 01/25/2012 Elsevier Interactive Patient Education 2016 Elsevier [employer] Breast Biopsy, Care After These instructions give you information on caring for yourself after your procedure. Your doctor may also give you more specific instructions. Call your doctor if you have any problems or questions after your procedure. HOME CARE  Only take medicine as told by your doctor.  Do not take aspirin.  Keep your sutures (stitches) dry when bathing.  Protect the biopsy area. Do not let the area get bumped.  Avoid activities that could pull the biopsy site open until your doctor approves. This includes:  Stretching.  Reaching.  Exercise.  Sports.  Lifting more than 3lb.  Continue your normal diet.  Wear a good support bra for as long as told by your doctor.  Change any bandages (dressings) as told by your doctor.  Do not drink alcohol while taking pain medicine.  Keep all doctor visits as told. Ask when your test results will be ready. Make sure you get your test results. GET HELP RIGHT AWAY IF:   You have a fever.  You have more bleeding (more than a small spot) from the biopsy site.  You have trouble breathing.  You have yellowish-white fluid (pus) coming from the biopsy site.  You have redness, puffiness (swelling), or more pain in the biopsy site.  You have a bad smell coming from the biopsy site.  Your biopsy site opens after sutures, staples, or sticky strips have been removed.  You have a rash.  You need stronger medicine. MAKE SURE YOU:  Understand these instructions.  Will watch your condition.  Will get help right away if you are not doing well or get worse.   This  information is not intended to replace advice given to you by your health care provider. Make sure you discuss any questions you have with your health care provider.   Document Released: 08/29/2009 Document Revised: 11/23/2014 Document Reviewed: 12/13/2011 Elsevier Interactive Patient Education 2016 Elsevier Inc. PATIENT INSTRUCTIONS POST-ANESTHESIA  IMMEDIATELY FOLLOWING SURGERY:  Do not drive or operate machinery for the first twenty four hours after surgery.  Do not make any important decisions for twenty four hours after surgery or while taking narcotic pain medications or sedatives.  If you develop intractable nausea and vomiting or a severe headache please notify your doctor immediately.  FOLLOW-UP:  Please make an appointment with your surgeon as instructed. You do not need to  follow up with anesthesia unless specifically instructed to do so.  WOUND CARE INSTRUCTIONS (if applicable):  Keep a dry clean dressing on the anesthesia/puncture wound site if there is drainage.  Once the wound has quit draining you may leave it open to air.  Generally you should leave the bandage intact for twenty four hours unless there is drainage.  If the epidural site drains for more than 36-48 hours please call the anesthesia department.  QUESTIONS?:  Please feel free to call your physician or the hospital operator if you have any questions, and they will be happy to assist you.

## 2016-06-05 ENCOUNTER — Encounter (HOSPITAL_COMMUNITY)
Admission: RE | Admit: 2016-06-05 | Discharge: 2016-06-05 | Disposition: A | Discharge status: Home / Self Care | Payer: PPO | Source: Ambulatory Visit | Attending: General Surgery | Admitting: General Surgery

## 2016-06-05 ENCOUNTER — Encounter (HOSPITAL_COMMUNITY): Payer: Self-pay

## 2016-06-05 DIAGNOSIS — Z01810 Encounter for preprocedural cardiovascular examination: Secondary | ICD-10-CM | POA: Diagnosis not present

## 2016-06-05 HISTORY — DX: Personal history of urinary calculi: Z87.442

## 2016-06-09 ENCOUNTER — Encounter (HOSPITAL_COMMUNITY)
Admission: RE | Disposition: A | Discharge status: Home / Self Care | Payer: Self-pay | Source: Ambulatory Visit | Attending: General Surgery

## 2016-06-09 ENCOUNTER — Ambulatory Visit (HOSPITAL_COMMUNITY)
Admission: RE | Admit: 2016-06-09 | Discharge: 2016-06-09 | Disposition: A | Discharge status: Home / Self Care | Payer: PPO | Source: Ambulatory Visit | Attending: General Surgery | Admitting: General Surgery

## 2016-06-09 ENCOUNTER — Encounter (HOSPITAL_COMMUNITY): Payer: Self-pay | Admitting: *Deleted

## 2016-06-09 ENCOUNTER — Ambulatory Visit (HOSPITAL_COMMUNITY): Payer: PPO | Admitting: Anesthesiology

## 2016-06-09 ENCOUNTER — Other Ambulatory Visit (HOSPITAL_COMMUNITY): Payer: Self-pay

## 2016-06-09 DIAGNOSIS — C50912 Malignant neoplasm of unspecified site of left female breast: Secondary | ICD-10-CM

## 2016-06-09 DIAGNOSIS — Z79899 Other long term (current) drug therapy: Secondary | ICD-10-CM | POA: Diagnosis not present

## 2016-06-09 DIAGNOSIS — Z853 Personal history of malignant neoplasm of breast: Secondary | ICD-10-CM | POA: Diagnosis not present

## 2016-06-09 DIAGNOSIS — E119 Type 2 diabetes mellitus without complications: Secondary | ICD-10-CM | POA: Insufficient documentation

## 2016-06-09 DIAGNOSIS — R920 Mammographic microcalcification found on diagnostic imaging of breast: Secondary | ICD-10-CM | POA: Insufficient documentation

## 2016-06-09 DIAGNOSIS — N6320 Unspecified lump in the left breast, unspecified quadrant: Secondary | ICD-10-CM

## 2016-06-09 DIAGNOSIS — Z7984 Long term (current) use of oral hypoglycemic drugs: Secondary | ICD-10-CM | POA: Insufficient documentation

## 2016-06-09 DIAGNOSIS — E7800 Pure hypercholesterolemia, unspecified: Secondary | ICD-10-CM | POA: Insufficient documentation

## 2016-06-09 DIAGNOSIS — N641 Fat necrosis of breast: Secondary | ICD-10-CM | POA: Diagnosis not present

## 2016-06-09 DIAGNOSIS — R921 Mammographic calcification found on diagnostic imaging of breast: Secondary | ICD-10-CM | POA: Diagnosis not present

## 2016-06-09 HISTORY — PX: BREAST BIOPSY: SHX20

## 2016-06-09 LAB — GLUCOSE, CAPILLARY
GLUCOSE-CAPILLARY: 121 mg/dL — AB (ref 65–99)
Glucose-Capillary: 125 mg/dL — ABNORMAL HIGH (ref 65–99)

## 2016-06-09 SURGERY — BREAST BIOPSY WITH NEEDLE LOCALIZATION
Anesthesia: General | Laterality: Left

## 2016-06-09 MED ORDER — MIDAZOLAM HCL 2 MG/2ML IJ SOLN
INTRAMUSCULAR | Status: AC
  Filled 2016-06-09: qty 2

## 2016-06-09 MED ORDER — BUPIVACAINE HCL (PF) 0.5 % IJ SOLN
INTRAMUSCULAR | Status: DC | PRN
  Administered 2016-06-09: 8 mL

## 2016-06-09 MED ORDER — KETOROLAC TROMETHAMINE 30 MG/ML IJ SOLN
INTRAMUSCULAR | Status: AC
  Filled 2016-06-09: qty 1

## 2016-06-09 MED ORDER — LIDOCAINE HCL (CARDIAC) 20 MG/ML IV SOLN
INTRAVENOUS | Status: DC | PRN
  Administered 2016-06-09: 25 mg via INTRAVENOUS

## 2016-06-09 MED ORDER — FENTANYL CITRATE (PF) 100 MCG/2ML IJ SOLN
25.0000 ug | INTRAMUSCULAR | Status: DC | PRN
  Administered 2016-06-09: 25 ug via INTRAVENOUS

## 2016-06-09 MED ORDER — FENTANYL CITRATE (PF) 100 MCG/2ML IJ SOLN
INTRAMUSCULAR | Status: AC
  Filled 2016-06-09: qty 2

## 2016-06-09 MED ORDER — KETOROLAC TROMETHAMINE 30 MG/ML IJ SOLN
30.0000 mg | Freq: Once | INTRAMUSCULAR | Status: AC
  Administered 2016-06-09: 30 mg via INTRAVENOUS

## 2016-06-09 MED ORDER — FENTANYL CITRATE (PF) 100 MCG/2ML IJ SOLN
25.0000 ug | INTRAMUSCULAR | Status: DC | PRN
Start: 2016-06-09 — End: 2016-06-09

## 2016-06-09 MED ORDER — ONDANSETRON HCL 4 MG/2ML IJ SOLN: 4.0000 mg | Freq: Once | INTRAMUSCULAR | Status: DC | PRN

## 2016-06-09 MED ORDER — HYDROCODONE-ACETAMINOPHEN 5-325 MG PO TABS: 1.0000 | ORAL_TABLET | Freq: Four times a day (QID) | ORAL | 0 refills | Status: DC | PRN

## 2016-06-09 MED ORDER — CHLORHEXIDINE GLUCONATE CLOTH 2 % EX PADS: 6.0000 | MEDICATED_PAD | Freq: Once | CUTANEOUS | Status: DC

## 2016-06-09 MED ORDER — ONDANSETRON HCL 4 MG/2ML IJ SOLN
4.0000 mg | Freq: Once | INTRAMUSCULAR | Status: AC
  Administered 2016-06-09: 4 mg via INTRAVENOUS

## 2016-06-09 MED ORDER — MIDAZOLAM HCL 2 MG/2ML IJ SOLN
1.0000 mg | INTRAMUSCULAR | Status: DC | PRN
  Administered 2016-06-09: 2 mg via INTRAVENOUS

## 2016-06-09 MED ORDER — LACTATED RINGERS IV SOLN
INTRAVENOUS | Status: DC
  Administered 2016-06-09: 12:00:00 via INTRAVENOUS

## 2016-06-09 MED ORDER — PROPOFOL 10 MG/ML IV BOLUS
INTRAVENOUS | Status: AC
  Filled 2016-06-09: qty 40

## 2016-06-09 MED ORDER — CEFAZOLIN SODIUM-DEXTROSE 2-4 GM/100ML-% IV SOLN
2.0000 g | INTRAVENOUS | Status: AC
  Administered 2016-06-09: 2 g via INTRAVENOUS

## 2016-06-09 MED ORDER — ONDANSETRON HCL 4 MG/2ML IJ SOLN
INTRAMUSCULAR | Status: AC
  Filled 2016-06-09: qty 2

## 2016-06-09 MED ORDER — CEFAZOLIN SODIUM-DEXTROSE 2-4 GM/100ML-% IV SOLN
INTRAVENOUS | Status: AC
  Filled 2016-06-09: qty 100

## 2016-06-09 MED ORDER — 0.9 % SODIUM CHLORIDE (POUR BTL) OPTIME
TOPICAL | Status: DC | PRN
  Administered 2016-06-09: 1000 mL

## 2016-06-09 MED ORDER — FENTANYL CITRATE (PF) 100 MCG/2ML IJ SOLN
INTRAMUSCULAR | Status: DC | PRN
  Administered 2016-06-09: 25 ug via INTRAVENOUS
  Administered 2016-06-09: 50 ug via INTRAVENOUS
  Administered 2016-06-09: 25 ug via INTRAVENOUS

## 2016-06-09 MED ORDER — BUPIVACAINE HCL (PF) 0.5 % IJ SOLN
INTRAMUSCULAR | Status: AC
  Filled 2016-06-09: qty 30

## 2016-06-09 MED ORDER — LIDOCAINE HCL (PF) 2 % IJ SOLN
INTRAMUSCULAR | Status: AC
  Filled 2016-06-09: qty 10

## 2016-06-09 MED ORDER — PROPOFOL 10 MG/ML IV BOLUS
INTRAVENOUS | Status: DC | PRN
  Administered 2016-06-09: 100 mg via INTRAVENOUS

## 2016-06-09 SURGICAL SUPPLY — 32 items
ADH SKN CLS APL DERMABOND .7 (GAUZE/BANDAGES/DRESSINGS) ×1
BAG HAMPER (MISCELLANEOUS) ×3 IMPLANT
BLADE SURG 15 STRL LF DISP TIS (BLADE) ×1 IMPLANT
BLADE SURG 15 STRL SS (BLADE) ×3
CHLORAPREP W/TINT 26ML (MISCELLANEOUS) ×3 IMPLANT
CLOTH BEACON ORANGE TIMEOUT ST (SAFETY) ×3 IMPLANT
COVER LIGHT HANDLE STERIS (MISCELLANEOUS) ×6 IMPLANT
DECANTER SPIKE VIAL GLASS SM (MISCELLANEOUS) ×3 IMPLANT
DERMABOND ADVANCED (GAUZE/BANDAGES/DRESSINGS) ×2
DERMABOND ADVANCED .7 DNX12 (GAUZE/BANDAGES/DRESSINGS) IMPLANT
ELECT REM PT RETURN 9FT ADLT (ELECTROSURGICAL) ×3
ELECTRODE REM PT RTRN 9FT ADLT (ELECTROSURGICAL) ×1 IMPLANT
FORMALIN 10 PREFIL 120ML (MISCELLANEOUS) ×3 IMPLANT
GLOVE BIOGEL PI IND STRL 7.0 (GLOVE) ×1 IMPLANT
GLOVE BIOGEL PI INDICATOR 7.0 (GLOVE) ×6
GLOVE SURG SS PI 7.5 STRL IVOR (GLOVE) ×6 IMPLANT
GOWN STRL REUS W/TWL LRG LVL3 (GOWN DISPOSABLE) ×9 IMPLANT
KIT ROOM TURNOVER APOR (KITS) ×3 IMPLANT
MANIFOLD NEPTUNE II (INSTRUMENTS) ×3 IMPLANT
NDL HYPO 18GX1.5 BLUNT FILL (NEEDLE) ×1 IMPLANT
NDL HYPO 25X1 1.5 SAFETY (NEEDLE) ×1 IMPLANT
NEEDLE HYPO 18GX1.5 BLUNT FILL (NEEDLE) ×3 IMPLANT
NEEDLE HYPO 25X1 1.5 SAFETY (NEEDLE) ×3 IMPLANT
NS IRRIG 1000ML POUR BTL (IV SOLUTION) ×3 IMPLANT
PACK MINOR (CUSTOM PROCEDURE TRAY) ×3 IMPLANT
PAD ARMBOARD 7.5X6 YLW CONV (MISCELLANEOUS) ×3 IMPLANT
SET BASIN LINEN APH (SET/KITS/TRAYS/PACK) ×3 IMPLANT
SUT SILK 2 0 SH (SUTURE) ×3 IMPLANT
SUT VIC AB 3-0 SH 27 (SUTURE) ×3
SUT VIC AB 3-0 SH 27X BRD (SUTURE) ×1 IMPLANT
SUT VIC AB 4-0 PS2 27 (SUTURE) ×3 IMPLANT
SYR CONTROL 10ML LL (SYRINGE) ×3 IMPLANT

## 2016-06-09 NOTE — Anesthesia Postprocedure Evaluation (Signed)
Anesthesia Post Note  Patient: [name]  Procedure(s) Performed: Procedure(s) (LRB): LEFT BREAST BIOPSY AFTER NEEDLE LOCALIZATION (Left)  Patient location during evaluation: PACU Anesthesia Type: General Level of consciousness: awake and alert and oriented Pain management: pain level controlled Vital Signs Assessment: post-procedure vital signs reviewed and stable Respiratory status: spontaneous breathing Cardiovascular status: stable Anesthetic complications: no    Last Vitals:  Vitals:   06/09/16 1210 06/09/16 1304  BP: (!) 142/72 136/73  Resp: (!) 33 16  Temp:  36.7 C    Last Pain:  Vitals:   06/09/16 1304  PainSc: 0-No pain                 ADAMS, AMY A

## 2016-06-09 NOTE — Discharge Instructions (Signed)
Breast Biopsy, Care After Refer to this sheet in the next few weeks. These instructions provide you with information on caring for yourself after your procedure. Your caregiver may also give you more specific instructions. Your treatment has been planned according to current medical practices, but problems sometimes occur. Call your caregiver if you have any problems or questions after your procedure. HOME CARE INSTRUCTIONS   Only take over-the-counter or prescription medicines for pain, discomfort, or fever as directed by your caregiver.  Do not take aspirin. It can cause bleeding.  Keep stitches dry when bathing.  Protect the biopsy area. Do not let the area get bumped.  Avoid activities that may pull the incision site open until approved by your caregiver. This can include stretching, reaching, exercise, sports, or lifting over 3 pounds.  Resume your usual diet.  Wear a good support bra for as long as directed by your caregiver.  Change any bandages (dressings) as directed by your caregiver.  Do not drink alcohol while taking pain medicine.  Keep all your follow-up appointments with your caregiver. Ask when your test results will be ready. Make sure you get your test results. SEEK MEDICAL CARE IF:   You have redness, swelling, or increasing pain in the biopsy site.  You have a bad smell coming from the biopsy site or dressing.  Your biopsy site breaks open after the stitches (sutures), staples, or skin adhesive strips have been removed.  You have a rash.  You need stronger medicine. SEEK IMMEDIATE MEDICAL CARE IF:   You have a fever.  You have increased bleeding (more than a small spot) from the biopsy site.  You have difficulty breathing.  You have pus coming from the biopsy site. MAKE SURE YOU:  Understand these instructions.  Will watch your condition.  Will get help right away if you are not doing well or get worse.   This information is not intended to  replace advice given to you by your health care provider. Make sure you discuss any questions you have with your health care provider.   Document Released: 05/22/2005 Document Revised: 11/23/2014 Document Reviewed: 12/03/2011 Elsevier Interactive Patient Education 2016 [place] Anesthesia, Adult, Care After Refer to this sheet in the next few weeks. These instructions provide you with information on caring for yourself after your procedure. Your health care provider may also give you more specific instructions. Your treatment has been planned according to current medical practices, but problems sometimes occur. Call your health care provider if you have any problems or questions after your procedure. WHAT TO EXPECT AFTER THE PROCEDURE After the procedure, it is typical to experience:  Sleepiness.  Nausea and vomiting. HOME CARE INSTRUCTIONS  For the first 24 hours after general anesthesia:  Have a responsible person with you.  Do not drive a car. If you are alone, do not take public transportation.  Do not drink alcohol.  Do not take medicine that has not been prescribed by your health care provider.  Do not sign important papers or make important decisions.  You may resume a normal diet and activities as directed by your health care provider.  Change bandages (dressings) as directed.  If you have questions or problems that seem related to general anesthesia, call the hospital and ask for the anesthetist or anesthesiologist on call. SEEK MEDICAL CARE IF:  You have nausea and vomiting that continue the day after anesthesia.  You develop a rash. SEEK IMMEDIATE MEDICAL CARE IF:  You have difficulty breathing.  You have chest pain.  You have any allergic problems.   This information is not intended to replace advice given to you by your health care provider. Make sure you discuss any questions you have with your health care provider.   Document Released:  02/08/2001 Document Revised: 11/23/2014 Document Reviewed: 03/02/2012 Elsevier Interactive Patient Education [employer].

## 2016-06-09 NOTE — Interval H&P Note (Signed)
History and Physical Interval Note:  06/09/2016 12:03 PM  [name]  has presented today for surgery, with the diagnosis of Microcalcifications of left breast, personal history of invasive ductal carcinoma of left breast  The various methods of treatment have been discussed with the patient and family. After consideration of risks, benefits and other options for treatment, the patient has consented to  Procedure(s): LEFT BREAST BIOPSY AFTER NEEDLE LOCALIZATION (Left) as a surgical intervention .  The patient's history has been reviewed, patient examined, no change in status, stable for surgery.  I have reviewed the patient's chart and labs.  Questions were answered to the patient's satisfaction.     Aviva Signs A

## 2016-06-09 NOTE — Op Note (Signed)
Patient:  [name]  DOB:  Apr 15, 1946  MRN:  [id number]   Preop Diagnosis:  Left breast microcalcifications, history of invasive ductal carcinoma of left breast  Postop Diagnosis:  Same  Procedure:  Left breast biopsy after needle localization  Surgeon:  Aviva Signs, M.D.  Anes:  Gen.  Indications:  Patient is a 70 year old white female status post left partial mastectomy with sentinel lymph node biopsy in 2016 for invasive ductal carcinoma who now presents with an abnormal area of microcalcifications in the left breast. Core biopsy of the left breast lesion was discordant with the mammographic findings. The patient now comes to the operating room for left breast biopsy after needle localization. The risks and benefits of the procedure were fully explained to the patient, who gave informed consent.  Procedure note:  The patient was placed the supine position after already undergoing needle localization in the radiology department. After general anesthesia was given, left breast was prepped and draped using the usual sterile technique with DuraPrep. Surgical site confirmation was performed.  The needle was located at the 3:00 position. An incision was made in this region and dissection was taken down to the area of concern. This was removed along with the needle without difficulty. It was sent to radiology and specimen radiography revealed the suspicious area to be within the specimen removed. The specimen was then sent to pathology further examination. A bleeding was controlled using Bovie electrocautery. 0.5% Sensorcaine was instilled into the surrounding wound. The skin was closed using a 3-0 Vicryl subcuticular suture. Dermabond was applied.  All tape and needle counts were correct at the end of the procedure. Patient was awakened and transferred to PACU in stable condition.  Complications:  None  EBL:  Minimal  Specimen:  Left breast tissue

## 2016-06-09 NOTE — Transfer of Care (Signed)
Immediate Anesthesia Transfer of Care Note  Patient: [name]  Procedure(s) Performed: Procedure(s): LEFT BREAST BIOPSY AFTER NEEDLE LOCALIZATION (Left)  Patient Location: PACU  Anesthesia Type:General  Level of Consciousness: awake, alert  and oriented  Airway & Oxygen Therapy: Patient Spontanous Breathing and Patient connected to nasal cannula oxygen  Post-op Assessment: Report given to RN, Post -op Vital signs reviewed and stable and Patient moving all extremities X 4  Post vital signs: Reviewed and stable  Last Vitals:  Vitals:   06/09/16 1205 06/09/16 1210  BP: (!) 142/72 (!) 142/72  Resp: (!) 23 (!) 33    Last Pain: There were no vitals filed for this visit.       Complications: No apparent anesthesia complications

## 2016-06-09 NOTE — Anesthesia Procedure Notes (Signed)
Procedure Name: LMA Insertion Date/Time: 06/09/2016 12:23 PM Performed by: Andree Elk, AMY A Pre-anesthesia Checklist: Patient identified, Timeout performed, Emergency Drugs available, Suction available and Patient being monitored Patient Re-evaluated:Patient Re-evaluated prior to inductionOxygen Delivery Method: Circle system utilized Preoxygenation: Pre-oxygenation with 100% oxygen Intubation Type: IV induction Ventilation: Mask ventilation without difficulty LMA: LMA inserted LMA Size: 4.0 Tube secured with: Tape Dental Injury: Teeth and Oropharynx as per pre-operative assessment

## 2016-06-09 NOTE — Anesthesia Preprocedure Evaluation (Signed)
Anesthesia Evaluation  Patient identified by MRN, date of birth, ID band Patient awake    Reviewed: Allergy & Precautions, H&P , NPO status , Patient's Chart, lab work & pertinent test results  Airway Mallampati: II  TM Distance: >3 FB Neck ROM: full    Dental  (+) Edentulous Upper, Edentulous Lower   Pulmonary neg pulmonary ROS, former smoker,  breath sounds clear to auscultation        Cardiovascular hypertension, Pt. on medications Rhythm:Regular Rate:Normal     Neuro/Psych  Neuromuscular disease    GI/Hepatic hiatal hernia, GERD-  Medicated and Controlled,(+) Hepatitis -, AHepatitis A as a child.   Endo/Other  diabetes, Type 2, Oral Hypoglycemic Agents  Renal/GU      Musculoskeletal  (+) Arthritis -,   Abdominal   Peds  Hematology   Anesthesia Other Findings   Reproductive/Obstetrics                             Anesthesia Physical Anesthesia Plan  ASA: III  Anesthesia Plan: General   Post-op Pain Management:    Induction: Intravenous  Airway Management Planned: LMA  Additional Equipment:   Intra-op Plan:   Post-operative Plan: Extubation in OR  Informed Consent: I have reviewed the patients History and Physical, chart, labs and discussed the procedure including the risks, benefits and alternatives for the proposed anesthesia with the patient or authorized representative who has indicated his/her understanding and acceptance.     Plan Discussed with:   Anesthesia Plan Comments:         Anesthesia Quick Evaluation  

## 2016-06-15 ENCOUNTER — Encounter (HOSPITAL_COMMUNITY): Payer: Self-pay | Admitting: General Surgery

## 2016-06-18 DIAGNOSIS — M79672 Pain in left foot: Secondary | ICD-10-CM | POA: Diagnosis not present

## 2016-06-18 DIAGNOSIS — R601 Generalized edema: Secondary | ICD-10-CM | POA: Diagnosis not present

## 2016-06-18 DIAGNOSIS — S92355A Nondisplaced fracture of fifth metatarsal bone, left foot, initial encounter for closed fracture: Secondary | ICD-10-CM | POA: Diagnosis not present

## 2016-06-30 ENCOUNTER — Encounter (HOSPITAL_COMMUNITY): Payer: Self-pay | Admitting: Oncology

## 2016-06-30 ENCOUNTER — Encounter (HOSPITAL_COMMUNITY): Payer: PPO | Attending: Oncology | Admitting: Oncology

## 2016-06-30 VITALS — BP 135/61 | HR 88 | Temp 97.8°F | Resp 16 | Wt 192.4 lb

## 2016-06-30 DIAGNOSIS — C50312 Malignant neoplasm of lower-inner quadrant of left female breast: Secondary | ICD-10-CM

## 2016-06-30 DIAGNOSIS — Z79899 Other long term (current) drug therapy: Secondary | ICD-10-CM | POA: Insufficient documentation

## 2016-06-30 DIAGNOSIS — C50912 Malignant neoplasm of unspecified site of left female breast: Secondary | ICD-10-CM | POA: Insufficient documentation

## 2016-06-30 NOTE — Patient Instructions (Addendum)
[place] at [facility] Discharge Instructions  RECOMMENDATIONS MADE BY THE CONSULTANT AND ANY TEST RESULTS WILL BE SENT TO YOUR REFERRING PHYSICIAN.  Exam with Dr. Oliva Bustard today.    Dr. Arnoldo Morale told you the last procedure was negative. Bone density done in October of 2016.will need bone density in oct/november of this year. Return to see Dr. Zoey Gilkeson Muse in 6 months with labs. Continue your Arimidex and Calcium and vit D. Call for any concerns or questions.  Thank you for choosing [place] at [facility] to provide your oncology and hematology care.  To afford each patient quality time with our provider, please arrive at least 15 minutes before your scheduled appointment time.   Beginning January 23rd 2017 lab work for the [facility] will be done in the  Main lab at [facility] on 1st floor. If you have a lab appointment with the [facility] please come in thru the  Main Entrance and check in at the main information desk  You need to re-schedule your appointment should you arrive 10 or more minutes late.  We strive to give you quality time with our providers, and arriving late affects you and other patients whose appointments are after yours.  Also, if you no show three or more times for appointments you may be dismissed from the clinic at the providers discretion.     Again, thank you for choosing [facility].  Our hope is that these requests will decrease the amount of time that you wait before being seen by our physicians.       _____________________________________________________________  Should you have questions after your visit to [facility], please contact our office at [phone] between the hours of 8:30 a.m. and 4:30 p.m.  Voicemails left after 4:30 p.m. will not be returned until the following business day.  For prescription refill requests, have your pharmacy contact our office.          Resources For Cancer Patients and their Caregivers ? American Cancer Society: Can assist with transportation, wigs, general needs, runs Look Good Feel Better.        519-721-5925 ? Cancer Care: Provides financial assistance, online support groups, medication/co-pay assistance.  1-800-813-HOPE 878-364-9930) ? [place] Assists [place] cancer patients and their families through emotional , educational and financial support.  509-400-0379 ? [place] DSS Where to apply for food stamps, Medicaid and utility assistance. (806)502-5634 ? [facility]: Transportation to medical appointments. (220)349-5433 ? Social Security Administration: May apply for disability if have a Stage IV cancer. 445 689 [phone] ? [employer], Disability and Transit Services: Assists with nutrition, care and transit needs. [place] Support Programs: @10RELATIVEDAYS @ > Cancer Support Group  2nd Tuesday of the month 1pm-2pm, Journey Room  > Creative Journey  3rd Tuesday of the month 1130am-1pm, Journey Room  > Look Good Feel Better  1st Wednesday of the month 10am-12 noon, Journey Room (Call [place] to register [phone])

## 2016-06-30 NOTE — Progress Notes (Signed)
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 Arnoldo Morale on 06/26/2015, no sentinel node performed Sentinel node procedure on 08/01/2015 negative sentinel node. ONCOTYPE DX assay with recurrence score result of 5, Low risk, 10 year risk of distance recurrence tamoxifen alone 5%   HISTORY OF PRESENTING ILLNESS:  [name] 70 y.o. 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 Her original biopsy was performed on 05/02/2015 showing DCIS. Unfortunately after her definitive surgery, invasive disease was noted. Total tumor dimension was 2 cm.  Margins were good with the closest margin being approximately 1 mm. She had to go back and have a sentinel node. Since her original biopsy showed only DCIS it was not anticipated she would have invasive disease.  [name] returns to the [facility] alone today. She is dressed in a hospital gown today for her breast exam.   She says that, lately, she's been doing good on her pill and hasn't had any problems so far. She sees her PCP again on March 1.  She reports that lately her appetite is so-so and that her holidays were good, aside from her stomach virus. She remarks that she's still a little queasy from that, which she came down with on Tuesday after  Christmas.  She says she's still taking her regular little naps; in general, her sleeping habits are unchanged.   ROS questioning is negative. She confirms that her bowels are ok and denies any burning while she urinates.  She denies any problems with her right breast. She also confirms that her hands and feet are okay.  She says that the only issue is that sometimes, in her right breast, there will be a "a little shooting pain." She knows that these symptoms are due to her nerves being changed due to surgery in that breast.  All in all, she doesn't have any pressing questions or concerns today.  She does not currently need any refills. She recently changed her insurance, and knows to call the clinic when she needs a refill to get this sorted out.  //   06/30/2016  [name] returns to the [facility] today unaccompanied. She is wearing a brace on her lower left leg, because she broke her toe.  She confirms that she is taking her arimidex pill, calcium, and vitamin D. She believes her last bone density study was done sometime last year. She says she does have aches and pains in her joints, noting "I do have some, but I believe that's age related."   We discussed how she had a L breast lumpectomy and biopsy done recently, which came back negative. She was told everything was benign after that appointment on July 25th.  She notes that she used to smoke, years and years ago, for probably about 20 years. She does not smoke at present.  Her next colonoscopy will be in  2018, as she had her last in 2013.  Her last DEXA was in October of 2016.   MEDICAL HISTORY:  Past Medical History:  Diagnosis Date   Arthritis    Breast cancer ([place]) 06/26/15   left   Diabetes mellitus    Disorder of bone and cartilage, unspecified    Essential hypertension, benign    GERD (gastroesophageal reflux disease)    Hepatitis    Hep A as a child   Hiatal hernia    History of kidney stones     Nonspecific abnormal finding in stool contents 01/11/2012   Other and unspecified hyperlipidemia     SURGICAL HISTORY: Past Surgical History:  Procedure Laterality Date   ABDOMINAL HYSTERECTOMY     AXILLARY SENTINEL NODE BIOPSY Left 07/31/2015   Procedure: SENTINEL LYMPH NODE BIOPSY, LEFT AXILLA, ;  Surgeon: Aviva Signs, MD;  Location: [facility];  Service: General;  Laterality: Left;  Sentinel Node @ 1000   BREAST BIOPSY Left 06/09/2016   Procedure: LEFT BREAST BIOPSY AFTER NEEDLE LOCALIZATION;  Surgeon: Aviva Signs, MD;  Location: [facility];  Service: General;  Laterality: Left;   CARPAL TUNNEL RELEASE Left 11/02/2014   Procedure: CARPAL TUNNEL RELEASE;  Surgeon: Charlotte Crumb, MD;  Location: [facility];  Service: Orthopedics;  Laterality: Left;   CATARACT EXTRACTION Bilateral 2007   COLONOSCOPY W/ BIOPSIES AND POLYPECTOMY     FOOT SURGERY Left 2008   fractured foot   LITHOTRIPSY     OPEN REDUCTION INTERNAL FIXATION (ORIF) DISTAL RADIAL FRACTURE Left 11/02/2014   Procedure: OPEN REDUCTION INTERNAL FIXATION (ORIF) DISTAL RADIAL FRACTURE;  Surgeon: Charlotte Crumb, MD;  Location: [facility];  Service: Orthopedics;  Laterality: Left;   PARTIAL MASTECTOMY WITH NEEDLE LOCALIZATION Left 06/26/2015   Procedure: PARTIAL MASTECTOMY WITH NEEDLE LOCALIZATION;  Surgeon: Aviva Signs, MD;  Location: [facility];  Service: General;  Laterality: Left;  Needle Loc @ 8:00am    SOCIAL HISTORY: Social History   Social History   Marital status: Married    Spouse name: N/A   Number of children: 2   Years of education: N/A   Occupational History   Retired    Social History Main Topics   Smoking status: Former Smoker    Packs/day: 1.00    Years: 20.00    Types: Cigarettes    Quit date: 06/05/1986   Smokeless tobacco: Never Used   Alcohol use No   Drug use: No   Sexual activity: No   Other Topics Concern   Not on file   Social History Narrative   No narrative on file  Married, 15  years 2 children Formerly employed in [employer] for 29 years Hobbies include playing [place] Ex-smoker, Began at age 32, Quit many years ago ETOH, none   FAMILY HISTORY: Family History  Problem Relation Age of Onset   Hypertension Mother    Osteoporosis Mother    Diabetes Sister     x3   Colon cancer Neg Hx    indicated that the status of her mother is unknown. She indicated that the status of her sister is unknown. She indicated that the status of her neg hx is unknown.    ALLERGIES:  has No Known Allergies.   Mother deceased, 55, old age, she had a removal small cancer in lining of stomach Father deceased, 83, car accident 4 sisters. 2 deceased from COPD, heart attacks, age 73; bed ridden with diabetes, COPD, age 51   MEDICATIONS:  Current Outpatient Prescriptions  Medication Sig Dispense Refill   anastrozole (ARIMIDEX) 1 MG tablet Take 1 tablet (1 mg total) by mouth daily. 90 tablet 1   calcium carbonate (TUMS - DOSED IN MG ELEMENTAL CALCIUM) 500 MG chewable tablet Chew 1 tablet by mouth daily.     Cholecalciferol (VITAMIN D-3 PO) Take 1 tablet by mouth 2 (two) times daily. 1000 mg     ferrous sulfate (IRON SUPPLEMENT) 325 (65 FE) MG tablet Take 325 mg by mouth daily with breakfast.     HYDROcodone-acetaminophen (NORCO) 5-325 MG tablet Take 1 tablet by mouth every 6 (six) hours as needed for moderate pain. 30 tablet 0   losartan-hydrochlorothiazide (HYZAAR) 50-12.5 MG per tablet Take 1 tablet by mouth daily.     metFORMIN (GLUCOPHAGE) 1000 MG tablet Take 1,000 mg by mouth 2 (two) times daily with a meal.     Multiple Vitamins-Calcium (ONE-A-DAY WOMENS PO) Take 1 tablet by mouth daily.     Omega-3 Fatty Acids (FISH OIL PO) Take 1 capsule by mouth 2 (two) times daily.      omeprazole (PRILOSEC) 20 MG capsule Take 20 mg by mouth daily.     simvastatin (ZOCOR) 20 MG tablet Take 20 mg by mouth daily.     No current facility-administered medications for  this visit.    Review of Systems  Constitutional: Negative for fever, chills, weight loss and malaise/fatigue.  HENT: Negative for congestion, hearing loss, nosebleeds, sore throat and tinnitus.   Eyes: Negative for blurred vision, double vision, pain and discharge.  Respiratory: Negative for cough, hemoptysis, sputum production, shortness of breath and wheezing.   Cardiovascular: Negative for chest pain, palpitations, claudication, leg swelling and PND.  Gastrointestinal: Negative for heartburn, nausea, vomiting, abdominal pain, diarrhea, constipation, blood in stool and melena.  Genitourinary: Negative for dysuria, urgency, frequency and hematuria.  Musculoskeletal: Negative for myalgias, joint pain and falls.  Skin: Negative for itching and rash.  Neurological: Negative for dizziness, tingling, tremors, sensory change, speech change, focal weakness, seizures, loss of consciousness, weakness and headaches.  Endo/Heme/Allergies: Does not bruise/bleed easily.  Psychiatric/Behavioral: Negative for depression, suicidal ideas, memory loss and substance abuse. The patient is not nervous/anxious and does not have insomnia.    14 point ROS was done and is otherwise as detailed above or in HPI    PHYSICAL EXAMINATION: ECOG PERFORMANCE STATUS: 0 - Asymptomatic  Vitals:   06/30/16 1016  BP: 135/61  Pulse: 88  Resp: 16  Temp: 97.8 F (36.6 C)   Filed Weights   06/30/16 1016  Weight: 192 lb 6.4 oz (87.3 kg)   Physical Exam  Constitutional: She is oriented to person, place, and time and well-developed, well-nourished, and in no distress.  HENT:  Head: Normocephalic and atraumatic.  Nose: Nose normal.  Mouth/Throat: Oropharynx is clear and moist. No oropharyngeal exudate.  Eyes: Conjunctivae and EOM are normal. Pupils are equal, round, and reactive to light. Right eye exhibits no discharge. Left eye exhibits no discharge. No scleral icterus.  Neck: Normal range of motion. Neck supple. No  tracheal deviation present. No thyromegaly present.  Cardiovascular: Normal rate, regular rhythm and normal heart sounds.  Exam reveals no gallop and no friction rub.   No murmur heard. Pulmonary/Chest: Effort normal and breath sounds normal. She has no wheezes. She has no rales.  BREAST  L breast; her historical surgical incision site is at the 5-6 o'clock position; 4 cm in length; 3-4 cm from the nipple; well-healed.  8/15: L breast is examined; sites  from recent biopsies evident. Abdominal: Soft. Bowel sounds are normal. She exhibits no distension and no mass. There is no tenderness. There is no rebound and no guarding.  Musculoskeletal: Normal range of motion. She exhibits no edema.  Lymphadenopathy:    She has no cervical adenopathy.  Neurological: She is alert and oriented to person, place, and time. She has normal reflexes. No cranial nerve deficit. Gait normal. Coordination normal.  Skin: Skin is warm and dry. No rash noted.  Psychiatric: Mood, memory, affect and judgment normal.  Nursing note and vitals reviewed.  LABORATORY DATA:  I have reviewed the data as listed  CBC    Component Value Date/Time   WBC 5.3 05/29/2016 0815   RBC 4.49 05/29/2016 0815   HGB 12.6 05/29/2016 0815   HCT 38.7 05/29/2016 0815   PLT 183 05/29/2016 0815   MCV 86.2 05/29/2016 0815   MCH 28.1 05/29/2016 0815   MCHC 32.6 05/29/2016 0815   RDW 15.3 05/29/2016 0815   LYMPHSABS 1.1 05/29/2016 0815   MONOABS 0.5 05/29/2016 0815   EOSABS 0.0 05/29/2016 0815   BASOSABS 0.0 05/29/2016 0815   CMP     Component Value Date/Time   NA 141 05/29/2016 0815   K 4.0 05/29/2016 0815   CL 102 05/29/2016 0815   CO2 29 05/29/2016 0815   GLUCOSE 132 (H) 05/29/2016 0815   BUN 16 05/29/2016 0815   CREATININE 0.85 05/29/2016 0815   CALCIUM 9.7 05/29/2016 0815   PROT 6.9 05/29/2016 0815   ALBUMIN 3.9 05/29/2016 0815   AST 27 05/29/2016 0815   ALT 27 05/29/2016 0815   ALKPHOS 73 05/29/2016 0815   BILITOT 0.4  05/29/2016 0815   GFRNONAA >60 05/29/2016 0815   GFRAA >60 05/29/2016 0815    RADIOGRAPHIC STUDIES: I have personally reviewed the radiological images as listed and agreed with the findings in the report. Mm Lt Breast Bx W Loc Dev Ea Ad Lesion Img Bx Spec Stereo Guide  Result Date: 06/09/2016 CLINICAL DATA:  70 year old female post left breast excision. EXAM: SPECIMEN RADIOGRAPH OF THE LEFT BREAST COMPARISON:  Previous exam(s). FINDINGS: Status post excision of the left breast. The wire tip and biopsy marker clip are present and are marked for pathology. Numerous calcifications are present in the specimen radiograph. IMPRESSION: Specimen radiograph of the left breast. Electronically Signed   By: Everlean Alstrom M.D.   On: 06/09/2016 13:28  Mm Lt Plc Breast Loc Dev   1st Lesion  Inc Mammo Guide  Result Date: 06/09/2016 CLINICAL DATA:  70 year old female presents for left breast needle localization prior to planned excision. Stereotactic guided biopsy of calcifications in the upper-outer left breast on 05/06/2016 at site of coil shaped biopsy marking clip located lateral and anterior to the left breast lumpectomy site revealed fibrocystic change and vascular calcifications which was considered discordant and excision was recommended. EXAM: NEEDLE LOCALIZATION OF THE LEFT BREAST WITH MAMMO GUIDANCE COMPARISON:  Previous exams. FINDINGS: Patient presents for needle localization prior to left breast excision. I met with the patient and we discussed the procedure of needle localization including benefits and alternatives. We discussed the high likelihood of a successful procedure. We discussed the risks of the procedure, including infection, bleeding, tissue injury, and further surgery. Informed, written consent was given. The usual time-out protocol was performed immediately prior to the procedure. Using mammographic guidance, sterile technique, 1% lidocaine and a 7 cm modified Kopans needle, the  calcifications and coil shaped biopsy marking clip were localized using lateral  to medial approach. The images were marked for Dr. Arnoldo Morale. IMPRESSION: Needle localization left breast. No apparent complications. Electronically Signed   By: Everlean Alstrom M.D.   On: 06/09/2016 11:41   CLINICAL DATA: Post clip mammograms following stereotactic core needle biopsy of left breast calcifications and associated linear/branching soft tissue opacity.  EXAM: DIAGNOSTIC LEFT MAMMOGRAM POST ULTRASOUND BIOPSY  COMPARISON: Previous exam(s).  FINDINGS: Mammographic images were obtained following stereotactic guided biopsy of left breast calcifications. X shaped biopsy clip lies within the abnormal soft tissue and adjacent to residual calcifications in the lower inner left breast.  IMPRESSION: Well-positioned X shaped biopsy clip following stereotactic core needle biopsy.  Final Assessment: Post Procedure Mammograms for Marker Placement   Electronically Signed  By: Lajean Manes M.D.  On: 05/02/2015 11:43  CLINICAL DATA: Recent diagnosis of breast cancer. Preop for lumpectomy. Slight productive cough.  EXAM: CHEST 2 VIEW  COMPARISON: Chest radiograph 10/17/2004.  FINDINGS: The heart size and mediastinal contours are within normal limits. Both lungs are clear. The visualized skeletal structures are unremarkable. Stable slight eventration or elevation RIGHT hemidiaphragm. Thoracic atherosclerosis.  IMPRESSION: No active cardiopulmonary disease.   Electronically Signed  By: Staci Righter M.D.  On: 06/17/2015 15:16 REPORT OF SURGICAL PATHOLOGY ADDITIONAL INFORMATION: PROGNOSTIC INDICATORS Results: IMMUNOHISTOCHEMICAL AND MORPHOMETRIC ANALYSIS PERFORMED MANUALLY Estrogen Receptor: 100%, POSITIVE, STRONG STAINING INTENSITY Progesterone Receptor: 60%, POSITIVE, STRONG STAINING INTENSITY Proliferation Marker Ki67: 25% REFERENCE RANGE ESTROGEN  RECEPTOR NEGATIVE 0% POSITIVE =>1% REFERENCE RANGE PROGESTERONE RECEPTOR NEGATIVE 0% POSITIVE =>1% All controls stained appropriately Enid Cutter MD Pathologist, Electronic Signature ( Signed 07/02/2015) FLUORESCENCE IN-SITU HYBRIDIZATION Results: HER2 - NEGATIVE RATIO OF HER2/CEP17 SIGNALS 1.44 AVERAGE HER2 COPY NUMBER PER CELL 1.95 Reference Range: NEGATIVE HER2/CEP17 Ratio <2.0 and average HER2 copy number <4.0 EQUIVOCAL HER2/CEP17 Ratio <2.0 and average HER2 copy number >=4.0 and <6.0 1 of 4 FINAL for ORIYAH, LAMPHEAR H ([id number]) ADDITIONAL INFORMATION:(continued) POSITIVE HER2/CEP17 Ratio >=2.0 or <2.0 and average HER2 copy number >=6.0 Enid Cutter MD Pathologist, Electronic Signature ( Signed 07/02/2015) FINAL DIAGNOSIS Diagnosis Breast, lumpectomy, left - INVASIVE DUCTAL CARCINOMA, 2 CM. - HIGH GRADE DUCTAL CARCINOMA IN SITU. - INVASIVE CARCINOMA FOCALLY LESS THAN 0.1 CM FROM LATERAL MARGIN. - DUCTAL CARCINOMA IN SITU FOCALLY LESS THAN 0.1 CM FROM THE LATERAL MARGIN. Microscopic Comment BREAST, INVASIVE TUMOR, WITHOUT LYMPH NODES PRESENT Specimen, including laterality : Left breast. Procedure: Lumpectomy. Histologic type: Ductal. Grade: 2 Tubule formation: 3 Nuclear pleomorphism: 2 Mitotic: 2 Tumor size (gross measurement): 2 cm Margins: Free of tumor. Invasive, distance to closest margin: Less than 0.1 cm from the lateral margin. In-situ, distance to closest margin: Less than 0.1 cm from lateral margin. If margin positive, focally or broadly: N/A Lymphovascular invasion: Not identified. Ductal carcinoma in situ: Present. Grade: High grade. Extensive intraductal component: Yes Lobular neoplasia: No Tumor focality: Unifocal. Treatment effect: No If present, treatment effect in breast tissue, lymph nodes or both: N/A Extent of tumor: Skin: N/A Nipple: N/A Skeletal muscle: N/A Breast prognostic profile: Pending. Estrogen receptor: Pending. Progesterone  receptor: Pending. Her 2 neu: Pending. Ki-67: Pending. Non-neoplastic breast: Fibrocystic changes. 2 of 4 FINAL for ZETA, BUCY [phone]) Microscopic Comment(continued) TNM: pT1c, pNX, pMX Comments: There is extensive high grade ductal carcinoma in situ associated with moderately differentiated invasive ductal carcinoma. There are multiple foci where the tumor is less than 0.1 cm from the lateral margin. The remaining margins are 1 cm and greater. A breast prognostic profile is pending and will be reported as an  addendum. (JDP:ecj 06/28/2015) Claudette Laws MD Pathologist, Electronic Signature (Case signed 06/28/2015) Specimen Gross and Clinical Information Specimen(s) Obtained: Breast, lumpectomy, left Specimen Clinical Information left breast cancer Gross Specimen type: Received in formalin and placed in formalin at 10:50 a.m. on 06/26/15 is a left breast lumpectomy specimen. Size: 8.0 from superior to inferior x 7.0 from anterior to posterior x 1.5 cm from medial to lateral. Orientation: The specimen has a short suture designating superior and a long suture designating lateral. The specimen is inked as follows: green-anterior, blue-inferior, orange-lateral, yellow-medial, black-posterior, and red-superior. Localized area: A needle localization wire is inserted at the 8 o'clock position, which inserts superiorly and exits medially. Cut surface: Sectioning reveals a 2.0 x 1.8 x 1.0 cm firm gray-white ill-defined, focally cystic lesion within which an X-shaped biopsy clip is identified. The remaining parenchyma is 99% yellow lobulated adipose tissue with minimal dense white fibrous tissue. Margins: The lesion abuts the lateral margin and is located 1.0 cm from the medial and posterior margins, 3.0 cm from the anterior margin, and far from the superior and inferior margin. Prognostic indicators: Not taken at time of gross. Block summary: The lesion is entirely submitted as  follows: A = slice associated with biopsy clip to lateral margin B = representative lesion without margins C-E = representative lesion with lateral margin F = representative posterior, medial, and anterior margin G = representative superior and inferior margins H = representative uninvolved fibrous tissue Eight blocks total (KF:ds 06/27/15) Stain(s) used in Diagnosis: The following stain(s) were used in diagnosing the case: Her2 FISH, ER-ACIS, PR-ACIS, KI-67-ACIS. The control(s) stained appropriately. Disclaimer HER2 IQFISH pharmDX (code [id number]) is a direct fluorescence in-situ hybridization assay designed to quantitatively determine HER2 gene amplification in formalin-fixed, paraffin-embedded tissue specimens. It is performed at [facility] and is reported using ASCO/CAP scoring criteria published in 2013. PR progesterone receptor (PgR 636), immunohistochemical stains are performed on formalin fixed, paraffin embedded tissue using a 3,3"-diaminobenzidine (DAB) chromogen and [employer]AKO Autostainer System. The staining intensity of the nucleus is scored morphometrically using the Automated Cellular Imaging System (ACIS) and is reported as the percentage of tumor cell nuclei demonstrating specific nuclear staining. 3 of 4 FINAL for DAYJA, LOVER[place]GE ([id number]) Disclaimer(continued) Estrogen receptor (SP1), immunohistochemical stains are performed on formalin fixed, paraffin embedded tissue using a 3,3"-diaminobenzidine (DAB) chromogen and DAKO Autostainer System. The staining intensity of the nucleus is scored morphometrically using the Automated Cellular Imaging System (ACIS) and is reported as the percentage of tumor cell nuclei demonstrating specific nuclear staining. Ki-67 (Mib-1), immunohistochemical stains are performed on formalin fixed, paraffin embedded tissue using a 3,3"-diaminobenzidine (DAB) chromogen and [place]. The staining intensity of the nucleus is scored  morphometrically using the Automated Cellular Imaging System (ACIS) and is reported as the percentage of tumor cell nuclei demonstrating specific nuclear staining. Report signed out from the following location(s) Technical component and interpretation was performed at [email or web address], [place], [place] [address]. 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 Arnoldo Morale on 06/26/2015, no sentinel node performed Sentinel node procedure on 08/01/2015 negative sentinel node. ONCOTYPE DX assay with recurrence score result of 5, Low risk, 10 year risk of distance recurrence tamoxifen alone 5% DEXA on 08/26/2015 with normal bone density Arimidex 10/16/2015     In the mean time, I want her to continue taking her daily calcium and vitamin D.   She does not currently need any refills. She recently changed her insurance, and knows to call the clinic when she needs a refill to get this sorted out.  We will see her back in 3 months.    06/30/2016  We will check and make sure when she should receive her next bone density study.  She had one biopsy on her L breast in June in [place], and they took out more tissue from that afterward, on the 25th of July.  A breast examination was performed during the appointment today. She notes that she receives her mammograms in [place], and isn't due for her next until June.  She will continue her arimidex,  calcium, and vitamin D. She will return for follow-up in 6 months.  No orders of the defined types were placed in this encounter.  All questions were answered. The patient knows to call the clinic with any problems, questions or concerns. We can certainly see the patient much sooner if necessary.   This document serves as a record of services personally performed by Forest Gleason, MD. It was created on his behalf by Toni Amend, a trained medical scribe. The creation of this record is based on the scribe's personal observations and the provider's statements to them. This document has been checked and approved by the attending provider.  I have reviewed the above documentation for accuracy and completeness and I agree with the above.  ATTENDING NOTE ; She was examined.  Mammogram results have been reviewed.  Pathology report has been reviewed.  Patient had initial needle biopsy which revealed fat necrosis calcification that reason has been then excised on July 27 from the left breast and was benign with microcalcification which were benign. Patient is tolerating Arimidex very well with calcium and vitamin D and there is no clinical evidence of recurrent disease However recently patient had a fracture in the lower extremity with minimal trauma in view of that we would repeat bone density study in October and if patient continues to have progressing osteoporosis than biphosphonate therapy can be recommended.,  Patient will be reevaluated by Center in 6 month or before if needed   [facility]

## 2016-07-09 DIAGNOSIS — S92355D Nondisplaced fracture of fifth metatarsal bone, left foot, subsequent encounter for fracture with routine healing: Secondary | ICD-10-CM | POA: Diagnosis not present

## 2016-07-16 DIAGNOSIS — E782 Mixed hyperlipidemia: Secondary | ICD-10-CM | POA: Diagnosis not present

## 2016-07-16 DIAGNOSIS — E6609 Other obesity due to excess calories: Secondary | ICD-10-CM | POA: Diagnosis not present

## 2016-07-16 DIAGNOSIS — E119 Type 2 diabetes mellitus without complications: Secondary | ICD-10-CM | POA: Diagnosis not present

## 2016-07-16 DIAGNOSIS — I10 Essential (primary) hypertension: Secondary | ICD-10-CM | POA: Diagnosis not present

## 2016-07-16 DIAGNOSIS — K219 Gastro-esophageal reflux disease without esophagitis: Secondary | ICD-10-CM | POA: Diagnosis not present

## 2016-08-11 DIAGNOSIS — M79672 Pain in left foot: Secondary | ICD-10-CM | POA: Diagnosis not present

## 2016-08-11 DIAGNOSIS — S92355D Nondisplaced fracture of fifth metatarsal bone, left foot, subsequent encounter for fracture with routine healing: Secondary | ICD-10-CM | POA: Diagnosis not present

## 2016-08-29 ENCOUNTER — Ambulatory Visit (INDEPENDENT_AMBULATORY_CARE_PROVIDER_SITE_OTHER): Payer: PPO

## 2016-08-29 DIAGNOSIS — Z23 Encounter for immunization: Secondary | ICD-10-CM

## 2016-08-31 ENCOUNTER — Ambulatory Visit (HOSPITAL_COMMUNITY)
Admission: RE | Admit: 2016-08-31 | Discharge: 2016-08-31 | Disposition: A | Discharge status: Home / Self Care | Payer: PPO | Source: Ambulatory Visit | Attending: Oncology | Admitting: Oncology

## 2016-08-31 DIAGNOSIS — Z79899 Other long term (current) drug therapy: Secondary | ICD-10-CM | POA: Diagnosis not present

## 2016-08-31 DIAGNOSIS — C50312 Malignant neoplasm of lower-inner quadrant of left female breast: Secondary | ICD-10-CM | POA: Diagnosis not present

## 2016-08-31 DIAGNOSIS — E2839 Other primary ovarian failure: Secondary | ICD-10-CM | POA: Diagnosis not present

## 2016-10-27 DIAGNOSIS — E119 Type 2 diabetes mellitus without complications: Secondary | ICD-10-CM | POA: Diagnosis not present

## 2016-10-27 DIAGNOSIS — Z961 Presence of intraocular lens: Secondary | ICD-10-CM | POA: Diagnosis not present

## 2016-10-27 DIAGNOSIS — Z7984 Long term (current) use of oral hypoglycemic drugs: Secondary | ICD-10-CM | POA: Diagnosis not present

## 2016-10-27 DIAGNOSIS — H02824 Cysts of left upper eyelid: Secondary | ICD-10-CM | POA: Diagnosis not present

## 2016-12-09 ENCOUNTER — Encounter: Payer: Self-pay | Admitting: Gastroenterology

## 2016-12-31 ENCOUNTER — Encounter (HOSPITAL_COMMUNITY): Payer: PPO | Attending: Oncology | Admitting: Oncology

## 2016-12-31 ENCOUNTER — Encounter (HOSPITAL_COMMUNITY): Payer: PPO

## 2016-12-31 ENCOUNTER — Encounter (HOSPITAL_COMMUNITY): Payer: Self-pay

## 2016-12-31 VITALS — BP 149/66 | HR 83 | Temp 97.4°F | Resp 18 | Wt 185.1 lb

## 2016-12-31 DIAGNOSIS — K449 Diaphragmatic hernia without obstruction or gangrene: Secondary | ICD-10-CM | POA: Diagnosis not present

## 2016-12-31 DIAGNOSIS — K219 Gastro-esophageal reflux disease without esophagitis: Secondary | ICD-10-CM | POA: Diagnosis not present

## 2016-12-31 DIAGNOSIS — I10 Essential (primary) hypertension: Secondary | ICD-10-CM | POA: Diagnosis not present

## 2016-12-31 DIAGNOSIS — C50312 Malignant neoplasm of lower-inner quadrant of left female breast: Secondary | ICD-10-CM | POA: Diagnosis not present

## 2016-12-31 DIAGNOSIS — Z170 Estrogen receptor positive status [ER+]: Secondary | ICD-10-CM

## 2016-12-31 DIAGNOSIS — Z87891 Personal history of nicotine dependence: Secondary | ICD-10-CM | POA: Insufficient documentation

## 2016-12-31 DIAGNOSIS — Z87442 Personal history of urinary calculi: Secondary | ICD-10-CM | POA: Insufficient documentation

## 2016-12-31 DIAGNOSIS — E119 Type 2 diabetes mellitus without complications: Secondary | ICD-10-CM | POA: Diagnosis not present

## 2016-12-31 DIAGNOSIS — Z853 Personal history of malignant neoplasm of breast: Secondary | ICD-10-CM | POA: Diagnosis not present

## 2016-12-31 DIAGNOSIS — E785 Hyperlipidemia, unspecified: Secondary | ICD-10-CM | POA: Diagnosis not present

## 2016-12-31 DIAGNOSIS — Z9889 Other specified postprocedural states: Secondary | ICD-10-CM | POA: Insufficient documentation

## 2016-12-31 LAB — COMPREHENSIVE METABOLIC PANEL
ALBUMIN: 4.1 g/dL (ref 3.5–5.0)
ALK PHOS: 78 U/L (ref 38–126)
ALT: 28 U/L (ref 14–54)
ANION GAP: 10 (ref 5–15)
AST: 28 U/L (ref 15–41)
BILIRUBIN TOTAL: 0.3 mg/dL (ref 0.3–1.2)
BUN: 18 mg/dL (ref 6–20)
CALCIUM: 10.1 mg/dL (ref 8.9–10.3)
CO2: 30 mmol/L (ref 22–32)
Chloride: 103 mmol/L (ref 101–111)
Creatinine, Ser: 0.93 mg/dL (ref 0.44–1.00)
GFR calc Af Amer: >60 mL/min (ref >60)
GFR calc non Af Amer: >60 mL/min (ref >60)
GLUCOSE: 119 mg/dL — AB (ref 65–99)
POTASSIUM: 3.8 mmol/L (ref 3.5–5.1)
Sodium: 143 mmol/L (ref 135–145)
Total Protein: 7.1 g/dL (ref 6.5–8.1)

## 2016-12-31 LAB — CBC WITH DIFFERENTIAL/PLATELET
Basophils Absolute: 0 10*3/uL (ref 0.0–0.1)
Basophils Relative: 0 %
Eosinophils Absolute: 0.1 10*3/uL (ref 0.0–0.7)
Eosinophils Relative: 1 %
HEMATOCRIT: 41.1 % (ref 36.0–46.0)
HEMOGLOBIN: 13.6 g/dL (ref 12.0–15.0)
LYMPHS ABS: 1.5 10*3/uL (ref 0.7–4.0)
LYMPHS PCT: 21 %
MCH: 29.6 pg (ref 26.0–34.0)
MCHC: 33.1 g/dL (ref 30.0–36.0)
MCV: 89.3 fL (ref 78.0–100.0)
MONO ABS: 0.6 10*3/uL (ref 0.1–1.0)
MONOS PCT: 9 %
NEUTROS ABS: 4.9 10*3/uL (ref 1.7–7.7)
NEUTROS PCT: 69 %
Platelets: 189 10*3/uL (ref 150–400)
RBC: 4.6 MIL/uL (ref 3.87–5.11)
RDW: 15.4 % (ref 11.5–15.5)
WBC: 7.1 10*3/uL (ref 4.0–10.5)

## 2016-12-31 MED ORDER — ANASTROZOLE 1 MG PO TABS: 1.0000 mg | ORAL_TABLET | Freq: Every day | ORAL | 1 refills | Status: DC

## 2016-12-31 NOTE — Patient Instructions (Signed)
[place] at [facility] Discharge Instructions  RECOMMENDATIONS MADE BY THE CONSULTANT AND ANY TEST RESULTS WILL BE SENT TO YOUR REFERRING PHYSICIAN.  You were seen today by Dr. Barron Schmid We will refer you to GI for colonoscopy We will schedule you for bilateral diagnostic mammogram in June Follow up in 6 months with labwork See Amy up front for appointments   Thank you for choosing [place] at [facility] to provide your oncology and hematology care.  To afford each patient quality time with our provider, please arrive at least 15 minutes before your scheduled appointment time.    If you have a lab appointment with the [facility] please come in thru the  Main Entrance and check in at the main information desk  You need to re-schedule your appointment should you arrive 10 or more minutes late.  We strive to give you quality time with our providers, and arriving late affects you and other patients whose appointments are after yours.  Also, if you no show three or more times for appointments you may be dismissed from the clinic at the providers discretion.     Again, thank you for choosing [facility]  Our hope is that these requests will decrease the amount of time that you wait before being seen by our physicians.       _____________________________________________________________  Should you have questions after your visit to [facility], please contact our office at [phone] between the hours of 8:30 a.m. and 4:30 p.m.  Voicemails left after 4:30 p.m. will not be returned until the following business day.  For prescription refill requests, have your pharmacy contact our office.       Resources For Cancer Patients and their Caregivers ? American Cancer Society: Can assist with transportation, wigs, general needs, runs Look Good Feel Better.        754-609-0511 ? Cancer Care: Provides financial  assistance, online support groups, medication/co-pay assistance.  1-800-813-HOPE (220)227-1455) ? [place] Assists [place] cancer patients and their families through emotional , educational and financial support.  660-241-8963 ? [place] DSS Where to apply for food stamps, Medicaid and utility assistance. (215)878-5647 ? [facility]: Transportation to medical appointments. 228-477-3633 ? Social Security Administration: May apply for disability if have a Stage IV cancer. (870) 126-7491 573 661 9402 ? [employer], Disability and Transit Services: Assists with nutrition, care and transit needs. [place] Support Programs: @10RELATIVEDAYS @ > Cancer Support Group  2nd Tuesday of the month 1pm-2pm, Journey Room  > Creative Journey  3rd Tuesday of the month 1130am-1pm, Journey Room  > Look Good Feel Better  1st Wednesday of the month 10am-12 noon, Journey Room (Call [place] to register [phone])

## 2016-12-31 NOTE — Progress Notes (Signed)
Stephens Shire, MD [address] [place] [address]  No diagnosis found.  CURRENT THERAPY: Arimidex beginning on 10/16/2015  INTERVAL HISTORY: [name] 71 y.o. 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 Arnoldo Morale on 06/26/2015, followed by XRT by Dr. Isidore Moos from 09/10/2015- 10/08/2015, and now on Arimidex beginning on 10/16/2015.    Breast cancer ([place]) (Resolved)   08/16/2015 Initial Diagnosis    Breast cancer ([place])      09/11/2015 -  Radiation Therapy    Dr. 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 ER+ 100%, PR+ 80%      05/02/2015 Procedure    Left breast stereotactic core needle biopsy      06/26/2015 Oncotype testing    ONCOTYPE DX assay with recurrence score result of 5, Low risk, 10 year risk of distance recurrence tamoxifen alone 5%      06/26/2015 Definitive Surgery    Breast partial mastectomy after needle localization with Dr. Arnoldo Morale no sentinel node performed      06/26/2015 Pathology Results    INVASIVE DUCTAL CARCINOMA, 2 CM. - HIGH GRADE DUCTAL CARCINOMA IN SITU. - INVASIVE CARCINOMA FOCALLY LESS THAN 0.1 CM FROM LATERAL MARGIN. - DUCTAL CARCINOMA IN SITU FOCALLY LESS THAN 0.1 CM FROM THE LATERAL MARGIN.      06/26/2015 Pathology Results    ER+ 100%, PR + 60%, Ki-67 25%, HER2 NEGATIVE      07/31/2015 Procedure    Sentinel node procedure      07/31/2015 Pathology Results    ONE BENIGN LYMPH NODE (0/1).      08/26/2015 Imaging    Bone  density- Normal      09/10/2015 - 10/08/2015 Radiation Therapy    50 Gy left breast by Dr. Isidore Moos.      10/16/2015 -  Anti-estrogen oral therapy    Arimidex      05/08/2016 Procedure    Stereotactic-guided biopsies of the calcifications of left breast.      05/08/2016 Pathology Results    Breast, left, needle core biopsy, lateral and ant to lumpectomy site FIBROCYSTIC CHANGES VASCULAR CALCIFICATIONS NO EVIDENCE OF MALIGNANCY      05/08/2016 Pathology Results    2. Breast, left, needle core biopsy, lumpectomy site FAT NECROSIS WITH FIBROSIS AND MICROCALCIFICATIONS NO EVIDENCE OF MALIGNANCY      She presented for follow up today. She is doing well and tolerating Arimidex without any complaints including hot flashes, arthralgias.  Review of Systems  Constitutional: Negative.  Negative for chills, fever and weight loss.  HENT: Negative.   Eyes: Negative.   Respiratory: Negative.   Cardiovascular: Negative.   Gastrointestinal: Negative.  Negative for abdominal pain, constipation, diarrhea, nausea and vomiting.  Genitourinary: Negative.   Musculoskeletal: Negative.  Negative for joint pain and myalgias.  Skin: Negative.   Neurological: Negative.   Endo/Heme/Allergies: Negative.   Psychiatric/Behavioral: Negative.     Past Medical History:  Diagnosis Date  . Arthritis   .  Breast cancer ([facility]) 06/26/15   left  . Diabetes mellitus   . Disorder of bone and cartilage, unspecified   . Essential hypertension, benign   . GERD (gastroesophageal reflux disease)   . Hepatitis    Hep A as a child  . Hiatal hernia   . History of kidney stones   . Nonspecific abnormal finding in stool contents 01/11/2012  . Other and unspecified hyperlipidemia     Past Surgical History:  Procedure Laterality Date  . ABDOMINAL HYSTERECTOMY    . AXILLARY SENTINEL NODE BIOPSY Left 07/31/2015   Procedure: SENTINEL LYMPH NODE BIOPSY, LEFT AXILLA, ;  Surgeon: Franky Macho, MD;  Location: [facility];   Service: General;  Laterality: Left;  Sentinel Node @ 1000  . BREAST BIOPSY Left 06/09/2016   Procedure: LEFT BREAST BIOPSY AFTER NEEDLE LOCALIZATION;  Surgeon: Franky Macho, MD;  Location: [facility];  Service: General;  Laterality: Left;  . CARPAL TUNNEL RELEASE Left 11/02/2014   Procedure: CARPAL TUNNEL RELEASE;  Surgeon: Dairl Ponder, MD;  Location: [facility];  Service: Orthopedics;  Laterality: Left;  . CATARACT EXTRACTION Bilateral 2007  . COLONOSCOPY W/ BIOPSIES AND POLYPECTOMY    . FOOT SURGERY Left 2008   fractured foot  . LITHOTRIPSY    . OPEN REDUCTION INTERNAL FIXATION (ORIF) DISTAL RADIAL FRACTURE Left 11/02/2014   Procedure: OPEN REDUCTION INTERNAL FIXATION (ORIF) DISTAL RADIAL FRACTURE;  Surgeon: Dairl Ponder, MD;  Location: [facility];  Service: Orthopedics;  Laterality: Left;  . PARTIAL MASTECTOMY WITH NEEDLE LOCALIZATION Left 06/26/2015   Procedure: PARTIAL MASTECTOMY WITH NEEDLE LOCALIZATION;  Surgeon: Franky Macho, MD;  Location: [facility];  Service: General;  Laterality: Left;  Needle Loc @ 8:00am    Family History  Problem Relation Age of Onset  . Hypertension Mother   . Osteoporosis Mother   . Diabetes Sister     x3  . Colon cancer Neg Hx     Social History   Social History  . Marital status: Married    Spouse name: N/A  . Number of children: 2  . Years of education: N/A   Occupational History  . Retired    Social History Main Topics  . Smoking status: Former Smoker    Packs/day: 1.00    Years: 20.00    Types: Cigarettes    Quit date: 06/05/1986  . Smokeless tobacco: Never Used  . Alcohol use No  . Drug use: No  . Sexual activity: No   Other Topics Concern  . Not on file   Social History Narrative  . No narrative on file     PHYSICAL EXAMINATION  ECOG PERFORMANCE STATUS: 0 - Asymptomatic  Vitals:   12/31/16 1149  BP: (!) 149/66  Pulse: 83  Resp: 18  Temp: 97.4 F (36.3 C)   GENERAL:alert, no distress, well nourished, well developed,  comfortable, cooperative, obese, smiling and unaccompanied SKIN: skin color, texture, turgor are normal, no rashes or significant lesions HEAD: Normocephalic, No masses, lesions, tenderness or abnormalities EYES: normal, EOMI, Conjunctiva are pink and non-injected EARS: External ears normal OROPHARYNX:lips, buccal mucosa, and tongue normal and mucous membranes are moist  NECK: supple, trachea midline LYMPH:  No cervical, supraclavicular or axillary lymphadenopathy. BREAST:Right breast without nipple discharge, masses, or axillary lymphadenopathy. Left breast martial mastectomy, no masses palpated, no axillary lymphadenopathy. LUNGS: clear to auscultation no wheezing, rales, rhonchi HEART: regular rate & rhythm ABDOMEN:abdomen soft, non-tender and normal bowel sounds BACK: Back symmetric, no curvature. EXTREMITIES:less then 2 second capillary refill,  no joint deformities, effusion, or inflammation, no skin discoloration  NEURO: alert & oriented x 3 with fluent speech, no focal motor/sensory deficits, gait normal   LABORATORY DATA: CBC    Component Value Date/Time   WBC 7.1 12/31/2016 1039   RBC 4.60 12/31/2016 1039   HGB 13.6 12/31/2016 1039   HCT 41.1 12/31/2016 1039   PLT 189 12/31/2016 1039   MCV 89.3 12/31/2016 1039   MCH 29.6 12/31/2016 1039   MCHC 33.1 12/31/2016 1039   RDW 15.4 12/31/2016 1039   LYMPHSABS 1.5 12/31/2016 1039   MONOABS 0.6 12/31/2016 1039   EOSABS 0.1 12/31/2016 1039   BASOSABS 0.0 12/31/2016 1039      Chemistry      Component Value Date/Time   NA 143 12/31/2016 1039   K 3.8 12/31/2016 1039   CL 103 12/31/2016 1039   CO2 30 12/31/2016 1039   BUN 18 12/31/2016 1039   CREATININE 0.93 12/31/2016 1039      Component Value Date/Time   CALCIUM 10.1 12/31/2016 1039   ALKPHOS 78 12/31/2016 1039   AST 28 12/31/2016 1039   ALT 28 12/31/2016 1039   BILITOT 0.3 12/31/2016 1039        PENDING LABS:   PATHOLOGY:    ASSESSMENT AND PLAN:  Stage IA  (T1CN0M0) invasive left breast cancer, lower-inner quadrant, ER/PR+, HER2 NEGATIVE, S/P definitive surgery with partial mastectomy by Dr. Arnoldo Morale on 06/26/2015, followed by XRT by Dr. Isidore Moos from 09/10/2015- 10/08/2015, and now on Arimidex beginning on 10/16/2015.  06/09/17: left breast biopsy for microcalcification. Surg path: benign.  PLAN: Patient clinically NED on breast exam today. Continue surveillance. Mammogram June 2018; order placed. Refilled arimidex. Continue calcium-vitamin D supplementation.  Referral to local GI for screening colonoscopy per patient's request. RTC in 6 months.  THERAPY PLAN:  NCCN guidelines recommends the following surveillance for invasive breast cancer (2.2017):  A. History and Physical exam 1-4 times per year as clinically appropriate for 5 years, then annually.  B. Periodic screening for changes in family history and referral to genetics counseling as indicated  C. Educate, monitor, and refer to lymphedema management.  D. Mammography every 12 months  E. Routine imaging of reconstructed breast is not indicated.  F. In the absence of clinical signs and symptoms suggestive of recurrent disease, there is no indication for laboratory or imaging studies for metastases screening.  G. Women on Tamoxifen: annual gynecologic assessment every 12 months if uterus is present.  H. Women on aromatase inhibitor or who experience ovarian failure secondary to treatment should have monitoring of bone health with a bone mineral density determination at baseline and periodically thereafter.  I. Assess and encourage adherence to adjuvant endocrine therapy.  J. Evidence suggests that active lifestyle, healthy diet, limited alcohol intake, and achieving and maintaining an ideal body weight (20-25 BMI) may lead to optimal breast cancer outcomes.   All questions were answered. The patient knows to call the clinic with any problems, questions or concerns. We can certainly see the  patient much sooner if necessary.  Patient and plan discussed with Dr. Ancil Linsey and she is in agreement with the aforementioned.   This note is electronically signed by: Twana First, MD 12/31/2016 11:41 AM

## 2017-01-13 DIAGNOSIS — E782 Mixed hyperlipidemia: Secondary | ICD-10-CM | POA: Diagnosis not present

## 2017-01-13 DIAGNOSIS — R319 Hematuria, unspecified: Secondary | ICD-10-CM | POA: Diagnosis not present

## 2017-01-13 DIAGNOSIS — N200 Calculus of kidney: Secondary | ICD-10-CM | POA: Diagnosis not present

## 2017-01-13 DIAGNOSIS — I10 Essential (primary) hypertension: Secondary | ICD-10-CM | POA: Diagnosis not present

## 2017-01-13 DIAGNOSIS — E119 Type 2 diabetes mellitus without complications: Secondary | ICD-10-CM | POA: Diagnosis not present

## 2017-01-13 DIAGNOSIS — K219 Gastro-esophageal reflux disease without esophagitis: Secondary | ICD-10-CM | POA: Diagnosis not present

## 2017-01-13 DIAGNOSIS — E6609 Other obesity due to excess calories: Secondary | ICD-10-CM | POA: Diagnosis not present

## 2017-01-14 ENCOUNTER — Telehealth: Payer: Self-pay

## 2017-01-14 NOTE — Telephone Encounter (Signed)
LMOM she is ready to schedule her colonoscopy.

## 2017-01-14 NOTE — Telephone Encounter (Addendum)
Gastroenterology Pre-Procedure Review  Request Date: Requesting Physician:   PATIENT REVIEW QUESTIONS: The patient responded to the following health history questions as indicated:    1. Diabetes Melitis: YES 2. Joint replacements in the past 12 months: NO 3. Major health problems in the past 3 months: NO 4. Has an artificial valve or MVP: NO 5. Has a defibrillator: NO 6. Has been advised in past to take antibiotics in advance of a procedure like teeth cleaning: NO 7. Family history of colon cancer: NO 8. Alcohol Use: NO 9. History of sleep apnea: NO 10. History of coronary artery or other vascular stents placed within the last 12 months:NO    MEDICATIONS & ALLERGIES:    Patient reports the following regarding taking any blood thinners:   Plavix? NO Aspirin? BABY ASA 81 Coumadin? NO Brilinta? NO Xarelto? NO Eliquis? NO Pradaxa? NO Savaysa? NO Effient? NO  Patient confirms/reports the following medications:  Current Outpatient Prescriptions  Medication Sig Dispense Refill  . anastrozole (ARIMIDEX) 1 MG tablet Take 1 tablet (1 mg total) by mouth daily. 90 tablet 1  . Cholecalciferol (VITAMIN D-3 PO) Take 1 tablet by mouth 2 (two) times daily. 1000 mg    . ferrous sulfate (IRON SUPPLEMENT) 325 (65 FE) MG tablet Take 325 mg by mouth daily with breakfast.    . losartan-hydrochlorothiazide (HYZAAR) 50-12.5 MG per tablet Take 1 tablet by mouth daily.    . metFORMIN (GLUCOPHAGE) 1000 MG tablet Take 1,000 mg by mouth 2 (two) times daily with a meal.    . Multiple Vitamins-Calcium (ONE-A-DAY WOMENS PO) Take 1 tablet by mouth daily.    . Omega-3 Fatty Acids (FISH OIL PO) Take 1 capsule by mouth 2 (two) times daily.     Marland Kitchen omeprazole (PRILOSEC) 20 MG capsule Take 20 mg by mouth daily.    . simvastatin (ZOCOR) 20 MG tablet Take 20 mg by mouth daily.     No current facility-administered medications for this visit.     Patient confirms/reports the following allergies:  No Known  Allergies  No orders of the defined types were placed in this encounter.   AUTHORIZATION INFORMATION Primary Insurance: MEDICARE,  [place] #: [id number]  Group #:  Pre-Cert / Josem Kaufmann required:  Pre-Cert / Auth #:   Secondary Insurance: [place],  ID #: [id number],  Group #:  [id number] Pre-Cert / Auth required:  Pre-Cert / Auth #:   SCHEDULE INFORMATION: Procedure has been scheduled as follows:  Date: , Time:   Location:   This Gastroenterology Pre-Precedure Review Form is being routed to the following provider(s):

## 2017-01-15 NOTE — Telephone Encounter (Signed)
Tried to call with no answer  

## 2017-01-15 NOTE — Telephone Encounter (Signed)
Forwarding to Ginger who triaged.  

## 2017-01-15 NOTE — Telephone Encounter (Signed)
Ok to schedule.   Hold iron for 7 days.  Day before procedure: metformin 500mg  bid Day of procedure: hold metformin until after procedure.

## 2017-01-18 NOTE — Telephone Encounter (Signed)
Tried to call with no answer  

## 2017-01-19 ENCOUNTER — Other Ambulatory Visit: Payer: Self-pay

## 2017-01-19 DIAGNOSIS — Z1211 Encounter for screening for malignant neoplasm of colon: Secondary | ICD-10-CM

## 2017-01-19 MED ORDER — PEG 3350-KCL-NA BICARB-NACL 420 G PO SOLR: 4000.0000 mL | ORAL | 0 refills | Status: DC

## 2017-01-19 NOTE — Telephone Encounter (Addendum)
Pt is set up for TCS on 02/03/17 @ 12:45 pm with [facility]. She is aware and instructions are in the mail.

## 2017-01-19 NOTE — Telephone Encounter (Signed)
PA is pending.

## 2017-02-01 NOTE — Telephone Encounter (Signed)
Received fax from [facility]. TCS approved 01/19/17-04/19/17. Auth# [id number].

## 2017-02-02 ENCOUNTER — Telehealth: Payer: Self-pay

## 2017-02-02 NOTE — Telephone Encounter (Signed)
Talked with patient and she is aware that the time has been changed to be at the hospital at 7:30 am in the morning.

## 2017-02-03 ENCOUNTER — Ambulatory Visit (HOSPITAL_COMMUNITY)
Admission: RE | Admit: 2017-02-03 | Discharge: 2017-02-03 | Disposition: A | Discharge status: Home / Self Care | Payer: PPO | Source: Ambulatory Visit | Attending: Gastroenterology | Admitting: Gastroenterology

## 2017-02-03 ENCOUNTER — Encounter (HOSPITAL_COMMUNITY): Payer: Self-pay | Admitting: *Deleted

## 2017-02-03 ENCOUNTER — Encounter (HOSPITAL_COMMUNITY)
Admission: RE | Disposition: A | Discharge status: Home / Self Care | Payer: Self-pay | Source: Ambulatory Visit | Attending: Gastroenterology

## 2017-02-03 DIAGNOSIS — K219 Gastro-esophageal reflux disease without esophagitis: Secondary | ICD-10-CM | POA: Insufficient documentation

## 2017-02-03 DIAGNOSIS — Z853 Personal history of malignant neoplasm of breast: Secondary | ICD-10-CM | POA: Diagnosis not present

## 2017-02-03 DIAGNOSIS — Z1211 Encounter for screening for malignant neoplasm of colon: Secondary | ICD-10-CM

## 2017-02-03 DIAGNOSIS — Z87891 Personal history of nicotine dependence: Secondary | ICD-10-CM | POA: Diagnosis not present

## 2017-02-03 DIAGNOSIS — D123 Benign neoplasm of transverse colon: Secondary | ICD-10-CM | POA: Insufficient documentation

## 2017-02-03 DIAGNOSIS — Z86010 Personal history of colonic polyps: Secondary | ICD-10-CM | POA: Insufficient documentation

## 2017-02-03 DIAGNOSIS — E785 Hyperlipidemia, unspecified: Secondary | ICD-10-CM | POA: Diagnosis not present

## 2017-02-03 DIAGNOSIS — K5730 Diverticulosis of large intestine without perforation or abscess without bleeding: Secondary | ICD-10-CM | POA: Insufficient documentation

## 2017-02-03 DIAGNOSIS — Z7982 Long term (current) use of aspirin: Secondary | ICD-10-CM | POA: Diagnosis not present

## 2017-02-03 DIAGNOSIS — E119 Type 2 diabetes mellitus without complications: Secondary | ICD-10-CM | POA: Insufficient documentation

## 2017-02-03 DIAGNOSIS — D124 Benign neoplasm of descending colon: Secondary | ICD-10-CM | POA: Diagnosis not present

## 2017-02-03 DIAGNOSIS — Z79811 Long term (current) use of aromatase inhibitors: Secondary | ICD-10-CM | POA: Insufficient documentation

## 2017-02-03 DIAGNOSIS — K648 Other hemorrhoids: Secondary | ICD-10-CM | POA: Diagnosis not present

## 2017-02-03 DIAGNOSIS — Z79899 Other long term (current) drug therapy: Secondary | ICD-10-CM | POA: Insufficient documentation

## 2017-02-03 DIAGNOSIS — Z7984 Long term (current) use of oral hypoglycemic drugs: Secondary | ICD-10-CM | POA: Diagnosis not present

## 2017-02-03 DIAGNOSIS — I10 Essential (primary) hypertension: Secondary | ICD-10-CM | POA: Diagnosis not present

## 2017-02-03 DIAGNOSIS — K644 Residual hemorrhoidal skin tags: Secondary | ICD-10-CM | POA: Insufficient documentation

## 2017-02-03 HISTORY — PX: COLONOSCOPY: SHX5424

## 2017-02-03 HISTORY — PX: POLYPECTOMY: SHX5525

## 2017-02-03 LAB — GLUCOSE, CAPILLARY: GLUCOSE-CAPILLARY: 144 mg/dL — AB (ref 65–99)

## 2017-02-03 SURGERY — COLONOSCOPY
Anesthesia: Moderate Sedation

## 2017-02-03 MED ORDER — SODIUM CHLORIDE 0.9 % IV SOLN
INTRAVENOUS | Status: DC
  Administered 2017-02-03: 08:00:00 via INTRAVENOUS

## 2017-02-03 MED ORDER — MEPERIDINE HCL 100 MG/ML IJ SOLN
INTRAMUSCULAR | Status: AC
  Filled 2017-02-03: qty 2

## 2017-02-03 MED ORDER — MEPERIDINE HCL 100 MG/ML IJ SOLN
INTRAMUSCULAR | Status: DC | PRN
  Administered 2017-02-03: 50 mg via INTRAVENOUS
  Administered 2017-02-03: 25 mg via INTRAVENOUS

## 2017-02-03 MED ORDER — MIDAZOLAM HCL 5 MG/5ML IJ SOLN
INTRAMUSCULAR | Status: AC
  Filled 2017-02-03: qty 10

## 2017-02-03 MED ORDER — MIDAZOLAM HCL 5 MG/5ML IJ SOLN
INTRAMUSCULAR | Status: DC | PRN
  Administered 2017-02-03 (×2): 2 mg via INTRAVENOUS

## 2017-02-03 NOTE — H&P (Signed)
Primary Care Physician:  Stephens Shire, MD Primary Gastroenterologist:  Dr. Oneida Alar  Pre-Procedure History & Physical: HPI:  [name] is a 71 y.o. female here for  PERSONAL HISTORY OF POLYPS.  Past Medical History:  Diagnosis Date  . Arthritis   . Breast cancer ([place]) 06/26/15   left  . Diabetes mellitus   . Disorder of bone and cartilage, unspecified   . Essential hypertension, benign   . GERD (gastroesophageal reflux disease)   . Hepatitis    Hep A as a child  . Hiatal hernia   . History of kidney stones   . Nonspecific abnormal finding in stool contents 01/11/2012  . Other and unspecified hyperlipidemia     Past Surgical History:  Procedure Laterality Date  . ABDOMINAL HYSTERECTOMY    . AXILLARY SENTINEL NODE BIOPSY Left 07/31/2015   Procedure: SENTINEL LYMPH NODE BIOPSY, LEFT AXILLA, ;  Surgeon: Aviva Signs, MD;  Location: [facility];  Service: General;  Laterality: Left;  Sentinel Node @ 1000  . BREAST BIOPSY Left 06/09/2016   Procedure: LEFT BREAST BIOPSY AFTER NEEDLE LOCALIZATION;  Surgeon: Aviva Signs, MD;  Location: [facility];  Service: General;  Laterality: Left;  . CARPAL TUNNEL RELEASE Left 11/02/2014   Procedure: CARPAL TUNNEL RELEASE;  Surgeon: Charlotte Crumb, MD;  Location: [facility];  Service: Orthopedics;  Laterality: Left;  . CATARACT EXTRACTION Bilateral 2007  . COLONOSCOPY W/ BIOPSIES AND POLYPECTOMY    . FOOT SURGERY Left 2008   fractured foot  . LITHOTRIPSY    . OPEN REDUCTION INTERNAL FIXATION (ORIF) DISTAL RADIAL FRACTURE Left 11/02/2014   Procedure: OPEN REDUCTION INTERNAL FIXATION (ORIF) DISTAL RADIAL FRACTURE;  Surgeon: Charlotte Crumb, MD;  Location: [facility];  Service: Orthopedics;  Laterality: Left;  . PARTIAL MASTECTOMY WITH NEEDLE LOCALIZATION Left 06/26/2015   Procedure: PARTIAL MASTECTOMY WITH NEEDLE LOCALIZATION;  Surgeon: Aviva Signs, MD;  Location: [facility];  Service: General;  Laterality: Left;  Needle Loc @ 8:00am    Prior to Admission  medications   Medication Sig Start Date End Date Taking? Authorizing Provider  acetaminophen (TYLENOL) 500 MG tablet Take 500 mg by mouth daily as needed for moderate pain or headache.   Yes Historical Provider, MD  anastrozole (ARIMIDEX) 1 MG tablet Take 1 tablet (1 mg total) by mouth daily. 12/31/16  Yes Twana First, MD  aspirin EC 81 MG tablet Take 81 mg by mouth every other day.   Yes Historical Provider, MD  Cholecalciferol (VITAMIN D3) 1000 units CAPS Take 1,000 Units by mouth at bedtime.   Yes Historical Provider, MD  losartan-hydrochlorothiazide (HYZAAR) 50-12.5 MG per tablet Take 1 tablet by mouth daily.   Yes Historical Provider, MD  metFORMIN (GLUCOPHAGE) 1000 MG tablet Take 1,000 mg by mouth 2 (two) times daily with a meal.   Yes Historical Provider, MD  Multiple Vitamins-Calcium (ONE-A-DAY WOMENS PO) Take 1 tablet by mouth daily.   Yes Historical Provider, MD  naproxen sodium (ANAPROX) 220 MG tablet Take 220 mg by mouth daily as needed (pain).   Yes Historical Provider, MD  Omega-3 Fatty Acids (FISH OIL) 1000 MG CAPS Take 1,000 mg by mouth 2 (two) times daily.   Yes Historical Provider, MD  omeprazole (PRILOSEC) 20 MG capsule Take 20 mg by mouth daily.   Yes Historical Provider, MD  polyethylene glycol-electrolytes (TRILYTE) 420 g solution Take 4,000 mLs by mouth as directed. 01/19/17  Yes Daneil Dolin, MD  simvastatin (ZOCOR) 20 MG tablet Take 20 mg by mouth at  bedtime.    Yes Historical Provider, MD  ferrous sulfate (IRON SUPPLEMENT) 325 (65 FE) MG tablet Take 325 mg by mouth daily with breakfast.    Historical Provider, MD    Allergies as of 01/19/2017  . (No Known Allergies)    Family History  Problem Relation Age of Onset  . Hypertension Mother   . Osteoporosis Mother   . Diabetes Sister     x3  . Colon cancer Neg Hx     Social History   Social History  . Marital status: Married    Spouse name: N/A  . Number of children: 2  . Years of education: N/A    Occupational History  . Retired    Social History Main Topics  . Smoking status: Former Smoker    Packs/day: 1.00    Years: 20.00    Types: Cigarettes    Quit date: 06/05/1986  . Smokeless tobacco: Never Used  . Alcohol use No  . Drug use: No  . Sexual activity: No   Other Topics Concern  . Not on file   Social History Narrative  . No narrative on file    Review of Systems: See HPI, otherwise negative ROS   Physical Exam: BP 140/76   Pulse 88   Temp 98 F (36.7 C) (Oral)   Resp (!) 21   Ht 5' 4.5" (1.638 m)   Wt 180 lb (81.6 kg)   SpO2 97%   BMI 30.42 kg/m  General:   Alert,  pleasant and cooperative in NAD Head:  Normocephalic and atraumatic. Neck:  Supple; Lungs:  Clear throughout to auscultation.    Heart:  Regular rate and rhythm. Abdomen:  Soft, nontender and nondistended. Normal bowel sounds, without guarding, and without rebound.   Neurologic:  Alert and  oriented x4;  grossly normal neurologically.  Impression/Plan:     PERSONAL HISTORY OF POLYPS.  PLAN: 1. TCS TODAY. DISCUSSED PROCEDURE, BENEFITS, & RISKS: < 1% chance of medication reaction, bleeding, perforation, or rupture of spleen/liver.

## 2017-02-03 NOTE — Op Note (Signed)
[facility] Patient Name: [name] Procedure Date: 02/03/2017 8:44 AM MRN: [id number] Date of Birth: [birth year] Attending MD: Barney Drain , MD CSN: [id number] Age: 71 Admit Type: Outpatient Procedure:                Colonoscopy WITH COLD FORCEPS & SNARE POLYPECTOMY Indications:              High risk colon cancer surveillance: Personal                            history of colonic polyps-LAST TCS                            2013([facility])-MULTIPLE POLYPS REMOVED Providers:                Barney Drain, MD, Lurline Del, RN, Aram Candela Referring MD:             Stephens Shire, MD Medicines:                Meperidine 75 mg IV, Midazolam 4 mg IV Complications:            No immediate complications. Estimated Blood Loss:     Estimated blood loss was minimal. Procedure:                Pre-Anesthesia Assessment:                           - Prior to the procedure, a History and Physical                            was performed, and patient medications and                            allergies were reviewed. The patient's tolerance of                            previous anesthesia was also reviewed. The risks                            and benefits of the procedure and the sedation                            options and risks were discussed with the patient.                            All questions were answered, and informed consent                            was obtained. Prior Anticoagulants: The patient has                            taken aspirin. ASA Grade Assessment: II - A patient                            with mild systemic disease. After reviewing the  risks and benefits, the patient was deemed in                            satisfactory condition to undergo the procedure.                            After obtaining informed consent, the colonoscope                            was passed under direct vision. Throughout the   procedure, the patient's blood pressure, pulse, and                            oxygen saturations were monitored continuously. The                            Colonoscope was introduced through the anus and                            advanced to the the cecum, identified by                            appendiceal orifice and ileocecal valve. The                            colonoscopy was somewhat difficult due to a                            tortuous colon. Successful completion of the                            procedure was aided by applying abdominal pressure                            and COLOWRAP. The patient tolerated the procedure                            well. The quality of the bowel preparation was                            excellent. The ileocecal valve, appendiceal                            orifice, and rectum were photographed. Scope In: 9:28:08 AM Scope Out: 9:51:40 AM Scope Withdrawal Time: 0 hours 20 minutes 17 seconds  Total Procedure Duration: 0 hours 23 minutes 32 seconds  Findings:      Three sessile polyps were found in the descending colon and hepatic       flexure. The polyps were 5 to 7 mm in size. These polyps were removed       with a hot snare. Resection and retrieval were complete.      Two sessile polyps were found in the descending colon. The polyps were 2       to 4 mm in size. These polyps were removed with a  cold biopsy forceps.       Resection and retrieval were complete.      Multiple small and large-mouthed diverticula were found in the sigmoid       colon and descending colon.      External and internal hemorrhoids were found during retroflexion. Impression:               - Three 5 to 7 mm polyps in the descending colon                            and at the hepatic flexure, removed with a hot                            snare. Resected and retrieved.                           - Two 2 to 4 mm polyps in the descending colon,                             removed with a cold biopsy forceps. Resected and                            retrieved.                           - Diverticulosis in the sigmoid colon and in the                            descending colon.                           - External and internal hemorrhoids. Moderate Sedation:      Moderate (conscious) sedation was administered by the endoscopy nurse       and supervised by the endoscopist. The following parameters were       monitored: oxygen saturation, heart rate, blood pressure, and response       to care. Total physician intraservice time was 43 minutes. Recommendation:           - Repeat colonoscopy in 3 years for surveillance.                           - High fiber diet.                           - Continue present medications.                           - Await pathology results.                           - Patient has a contact number available for                            emergencies. The signs and symptoms of potential  delayed complications were discussed with the                            patient. Return to normal activities tomorrow.                            Written discharge instructions were provided to the                            patient. Procedure Code(s):        --- Professional ---                           [phone], Colonoscopy, flexible; with removal of                            tumor(s), polyp(s), or other lesion(s) by snare                            technique                           45380, 59, Colonoscopy, flexible; with biopsy,                            single or multiple                           99152, Moderate sedation services provided by the                            same physician or other qualified health care                            professional performing the diagnostic or                            therapeutic service that the sedation supports,                            requiring the presence of an  independent trained                            observer to assist in the monitoring of the                            patient's level of consciousness and physiological                            status; initial 15 minutes of intraservice time,                            patient age 61 years or older                           99153, Moderate sedation  services; each additional                            15 minutes intraservice time                           99153, Moderate sedation services; each additional                            15 minutes intraservice time Diagnosis Code(s):        --- Professional ---                           Z86.010, Personal history of colonic polyps                           D12.3, Benign neoplasm of transverse colon (hepatic                            flexure or splenic flexure)                           D12.4, Benign neoplasm of descending colon                           K64.8, Other hemorrhoids                           K57.30, Diverticulosis of large intestine without                            perforation or abscess without bleeding CPT copyright 2016 [employer]. All rights reserved. The codes documented in this report are preliminary and upon coder review may  be revised to meet current compliance requirements. Barney Drain, MD Barney Drain, MD 02/03/2017 10:23:57 AM This report has been signed electronically. Number of Addenda: 0

## 2017-02-03 NOTE — Discharge Instructions (Signed)
You have internal and external hemorrhoids. YOU HAVE diverticulosis IN YOUR LEFT COLON. YOU HAD 5 POLYPS REMOVED.    SEE YOUR PRIMARY DOCTOR TO DISCUSS AN EVALUATION FOR SLEEP APNEA.  DRINK WATER TO KEEP YOUR URINE LIGHT YELLOW.  FOLLOW A HIGH FIBER DIET. AVOID ITEMS THAT CAUSE BLOATING. See info below.  CONTINUE YOUR WEIGHT LOSS EFFORTS. LOSE TEN POUNDS.  WHILE I DO NOT WANT TO ALARM YOU, YOUR BODY MASS INDEX IS OVER 30 WHICH MEANS YOU ARE OBESE. OBESITY IS ASSOCIATED WITH AN INCREASE RISK FOR ALL CANCERS, INCLUDING ESOPHAGEAL AND COLON CANCER.  USE PREPARATION H FOUR TIMES  A DAY IF NEEDED TO RELIEVE RECTAL PAIN/PRESSURE/BLEEDING. RESTRICT USE OF HYDROCORTISONE SUPPOSITORIES TO 3-4 TIMES A YEAR. IF CREAMS AND SUPPOSITORIES CANNOT CONTROL YOUR SYMPTOMS, YOU SHOULD CONSIDER SURGERY TO FIX YOUR HEMORRHOIDS.  YOUR BIOPSY RESULTS WILL BE AVAILABLE IN MY CHART  MAR 27 AND MY OFFICE WILL CONTACT YOU IN 10-14 DAYS WITH YOUR RESULTS.   Next colonoscopy in 3 years.  Colonoscopy Care After Read the instructions outlined below and refer to this sheet in the next week. These discharge instructions provide you with general information on caring for yourself after you leave the hospital. While your treatment has been planned according to the most current medical practices available, unavoidable complications occasionally occur. If you have any problems or questions after discharge, call DR. Jinx Gilden, [phone].  ACTIVITY  You may resume your regular activity, but move at a slower pace for the next 24 hours.   Take frequent rest periods for the next 24 hours.   Walking will help get rid of the air and reduce the bloated feeling in your belly (abdomen).   No driving for 24 hours (because of the medicine (anesthesia) used during the test).   You may shower.   Do not sign any important legal documents or operate any machinery for 24 hours (because of the anesthesia used during the test).     NUTRITION  Drink plenty of fluids.   You may resume your normal diet as instructed by your doctor.   Begin with a light meal and progress to your normal diet. Heavy or fried foods are harder to digest and may make you feel sick to your stomach (nauseated).   Avoid alcoholic beverages for 24 hours or as instructed.    MEDICATIONS  You may resume your normal medications.   WHAT YOU CAN EXPECT TODAY  Some feelings of bloating in the abdomen.   Passage of more gas than usual.   Spotting of blood in your stool or on the toilet paper  .  IF YOU HAD POLYPS REMOVED DURING THE COLONOSCOPY:  Eat a soft diet IF YOU HAVE NAUSEA, BLOATING, ABDOMINAL PAIN, OR VOMITING.    FINDING OUT THE RESULTS OF YOUR TEST Not all test results are available during your visit. DR. Oneida Alar WILL CALL YOU WITHIN 14 DAYS OF YOUR PROCEDUE WITH YOUR RESULTS. Do not assume everything is normal if you have not heard from DR. Karrah Mangini, CALL HER OFFICE AT [phone].  SEEK IMMEDIATE MEDICAL ATTENTION AND CALL THE OFFICE: [phone] IF:  You have more than a spotting of blood in your stool.   Your belly is swollen (abdominal distention).   You are nauseated or vomiting.   You have a temperature over 101F.   You have abdominal pain or discomfort that is severe or gets worse throughout the day.  High-Fiber Diet A high-fiber diet changes your normal diet to include more whole  grains, legumes, fruits, and vegetables. Changes in the diet involve replacing refined carbohydrates with unrefined foods. The calorie level of the diet is essentially unchanged. The Dietary Reference Intake (recommended amount) for adult males is 38 grams per day. For adult females, it is 25 grams per day. Pregnant and lactating women should consume 28 grams of fiber per day. Fiber is the intact part of a plant that is not broken down during digestion. Functional fiber is fiber that has been isolated from the plant to provide a  beneficial effect in the body. PURPOSE  Increase stool bulk.   Ease and regulate bowel movements.   Lower cholesterol.  REDUCE RISK OF COLON CANCER  INDICATIONS THAT YOU NEED MORE FIBER  Constipation and hemorrhoids.   Uncomplicated diverticulosis (intestine condition) and irritable bowel syndrome.   Weight management.   As a protective measure against hardening of the arteries (atherosclerosis), diabetes, and cancer.   GUIDELINES FOR INCREASING FIBER IN THE DIET  Start adding fiber to the diet slowly. A gradual increase of about 5 more grams (2 slices of whole-wheat bread, 2 servings of most fruits or vegetables, or 1 bowl of high-fiber cereal) per day is best. Too rapid an increase in fiber may result in constipation, flatulence, and bloating.   Drink enough water and fluids to keep your urine clear or pale yellow. Water, juice, or caffeine-free drinks are recommended. Not drinking enough fluid may cause constipation.   Eat a variety of high-fiber foods rather than one type of fiber.   Try to increase your intake of fiber through using high-fiber foods rather than fiber pills or supplements that contain small amounts of fiber.   The goal is to change the types of food eaten. Do not supplement your present diet with high-fiber foods, but replace foods in your present diet.   INCLUDE A VARIETY OF FIBER SOURCES  Replace refined and processed grains with whole grains, canned fruits with fresh fruits, and incorporate other fiber sources. White rice, white breads, and most bakery goods contain little or no fiber.   Brown whole-grain rice, buckwheat oats, and many fruits and vegetables are all good sources of fiber. These include: broccoli, Brussels sprouts, cabbage, cauliflower, beets, sweet potatoes, white potatoes (skin on), carrots, tomatoes, eggplant, squash, berries, fresh fruits, and dried fruits.   Cereals appear to be the richest source of fiber. Cereal fiber is found in  whole grains and bran. Bran is the fiber-rich outer coat of cereal grain, which is largely removed in refining. In whole-grain cereals, the bran remains. In breakfast cereals, the largest amount of fiber is found in those with "bran" in their names. The fiber content is sometimes indicated on the label.   You may need to include additional fruits and vegetables each day.   In baking, for 1 cup white flour, you may use the following substitutions:   1 cup whole-wheat flour minus 2 tablespoons.   1/2 cup white flour plus 1/2 cup whole-wheat flour.   Polyps, Colon  A polyp is extra tissue that grows inside your body. Colon polyps grow in the large intestine. The large intestine, also called the colon, is part of your digestive system. It is a long, hollow tube at the end of your digestive tract where your body makes and stores stool. Most polyps are not dangerous. They are benign. This means they are not cancerous. But over time, some types of polyps can turn into cancer. Polyps that are smaller than a pea are usually  not harmful. But larger polyps could someday become or may already be cancerous. To be safe, doctors remove all polyps and test them.   PREVENTION There is not one sure way to prevent polyps. You might be able to lower your risk of getting them if you:  Eat more fruits and vegetables and less fatty food.   Do not smoke.   Avoid alcohol.   Exercise every day.   Lose weight if you are overweight.   Eating more calcium and folate can also lower your risk of getting polyps. Some foods that are rich in calcium are milk, cheese, and broccoli. Some foods that are rich in folate are chickpeas, kidney beans, and spinach.    Diverticulosis Diverticulosis is a common condition that develops when small pouches (diverticula) form in the wall of the colon. The risk of diverticulosis increases with age. It happens more often in people who eat a low-fiber diet. Most individuals with  diverticulosis have no symptoms. Those individuals with symptoms usually experience belly (abdominal) pain, constipation, or loose stools (diarrhea).  HOME CARE INSTRUCTIONS  Increase the amount of fiber in your diet as directed by your caregiver or dietician. This may reduce symptoms of diverticulosis.   Drink at least 6 to 8 glasses of water each day to prevent constipation.   Try not to strain when you have a bowel movement.   Avoiding nuts and seeds to prevent complications is NOT NECESSARY.     FOODS HAVING HIGH FIBER CONTENT INCLUDE:  Fruits. Apple, peach, pear, tangerine, raisins, prunes.   Vegetables. Brussels sprouts, asparagus, broccoli, cabbage, carrot, cauliflower, romaine lettuce, spinach, summer squash, tomato, winter squash, zucchini.   Starchy Vegetables. Baked beans, kidney beans, lima beans, split peas, lentils, potatoes (with skin).   Grains. Whole wheat bread, brown rice, bran flake cereal, plain oatmeal, white rice, shredded wheat, bran muffins.    SEEK IMMEDIATE MEDICAL CARE IF:  You develop increasing pain or severe bloating.   You have an oral temperature above 101F.   You develop vomiting or bowel movements that are bloody or black.   Hemorrhoids Hemorrhoids are dilated (enlarged) veins around the rectum. Sometimes clots will form in the veins. This makes them swollen and painful. These are called thrombosed hemorrhoids. Causes of hemorrhoids include:  Constipation.   Straining to have a bowel movement.   HEAVY LIFTING  HOME CARE INSTRUCTIONS  Eat a well balanced diet and drink 6 to 8 glasses of water every day to avoid constipation. You may also use a bulk laxative.   Avoid straining to have bowel movements.   Keep anal area dry and clean.   Do not use a donut shaped pillow or sit on the toilet for long periods. This increases blood pooling and pain.   Move your bowels when your body has the urge; this will require less straining and will  decrease pain and pressure.

## 2017-02-05 ENCOUNTER — Encounter (HOSPITAL_COMMUNITY): Payer: Self-pay | Admitting: Gastroenterology

## 2017-02-22 ENCOUNTER — Telehealth: Payer: Self-pay

## 2017-02-22 NOTE — Telephone Encounter (Signed)
Reminder in epic °

## 2017-02-22 NOTE — Telephone Encounter (Signed)
Pt is aware of results. 

## 2017-02-22 NOTE — Telephone Encounter (Signed)
Please call pt. She had FIVE simple adenomas removed.    SEE PRIMARY DOCTOR TO DISCUSS AN EVALUATION FOR SLEEP APNEA.  DRINK WATER TO KEEP YOUR URINE LIGHT YELLOW.  FOLLOW A HIGH FIBER DIET. AVOID ITEMS THAT CAUSE BLOATING. See info below.  CONTINUE YOUR WEIGHT LOSS EFFORTS. LOSE TEN POUNDS.   USE PREPARATION H FOUR TIMES  A DAY IF NEEDED TO RELIEVE RECTAL PAIN/PRESSURE/BLEEDING.   Next colonoscopy in 3 years.

## 2017-02-22 NOTE — Telephone Encounter (Signed)
Pt would like results of colonoscopy done on 02/03/2017.

## 2017-03-01 DIAGNOSIS — N202 Calculus of kidney with calculus of ureter: Secondary | ICD-10-CM | POA: Diagnosis not present

## 2017-03-01 DIAGNOSIS — D7389 Other diseases of spleen: Secondary | ICD-10-CM | POA: Diagnosis not present

## 2017-03-02 ENCOUNTER — Other Ambulatory Visit: Payer: Self-pay | Admitting: Urology

## 2017-03-09 NOTE — Patient Instructions (Signed)
[name]  03/09/2017   Your procedure is scheduled on: 03/18/2017    Report to [facility] Main  Entrance and take [facility] elevators to [place] at 0815am.       Call this number if you have problems the morning of surgery [phone]    Remember: ONLY 1 PERSON MAY GO WITH YOU TO SHORT STAY TO GET  READY MORNING OF [place].  Do not eat food or drink liquids :After Midnight.              Eat a good healthy snack prior to bedtime.      Take these medicines the morning of surgery with A SIP OF WATER: Arimidex, Prilosec  DO NOT TAKE ANY DIABETIC MEDICATIONS DAY OF YOUR SURGERY                               You may not have any metal on your body including hair pins and              piercings  Do not wear jewelry, make-up, lotions, powders or perfumes, deodorant             Do not wear nail polish.  Do not shave  48 hours prior to surgery.                Do not bring valuables to the hospital. [place].  Contacts, dentures or bridgework may not be worn into surgery. .     Patients discharged the day of surgery will not be allowed to drive home.  Name and phone number of your driver:                Please read over the following fact sheets you were given: _____________________________________________________________________             [facility] - Preparing for Surgery Before surgery, you can play an important role.  Because skin is not sterile, your skin needs to be as free of germs as possible.  You can reduce the number of germs on your skin by washing with CHG (chlorahexidine gluconate) soap before surgery.  CHG is an antiseptic cleaner which kills germs and bonds with the skin to continue killing germs even after washing. Please DO NOT use if you have an allergy to CHG or antibacterial soaps.  If your skin becomes reddened/irritated stop using the CHG and inform your nurse  when you arrive at Short Stay. Do not shave (including legs and underarms) for at least 48 hours prior to the first CHG shower.  You may shave your face/neck. Please follow these instructions carefully:  1.  Shower with CHG Soap the night before surgery and the  morning of Surgery.  2.  If you choose to wash your hair, wash your hair first as usual with your  normal  shampoo.  3.  After you shampoo, rinse your hair and body thoroughly to remove the  shampoo.                           4.  Use CHG as you would any other liquid soap.  You can apply chg directly  to the skin and wash                       Gently with a scrungie or clean washcloth.  5.  Apply the CHG Soap to your body ONLY FROM THE NECK DOWN.   Do not use on face/ open                           Wound or open sores. Avoid contact with eyes, ears mouth and genitals (private parts).                       Wash face,  Genitals (private parts) with your normal soap.             6.  Wash thoroughly, paying special attention to the area where your surgery  will be performed.  7.  Thoroughly rinse your body with warm water from the neck down.  8.  DO NOT shower/wash with your normal soap after using and rinsing off  the CHG Soap.                9.  Pat yourself dry with a clean towel.            10.  Wear clean pajamas.            11.  Place clean sheets on your bed the night of your first shower and do not  sleep with pets. Day of Surgery : Do not apply any lotions/deodorants the morning of surgery.  Please wear clean clothes to the hospital/surgery center.  FAILURE TO FOLLOW THESE INSTRUCTIONS MAY RESULT IN THE CANCELLATION OF YOUR SURGERY PATIENT SIGNATURE_________________________________  NURSE SIGNATURE__________________________________  ________________________________________________________________________

## 2017-03-11 ENCOUNTER — Encounter (HOSPITAL_COMMUNITY)
Admission: RE | Admit: 2017-03-11 | Discharge: 2017-03-11 | Disposition: A | Discharge status: Home / Self Care | Payer: PPO | Source: Ambulatory Visit | Attending: Urology | Admitting: Urology

## 2017-03-11 ENCOUNTER — Encounter (HOSPITAL_COMMUNITY): Payer: Self-pay

## 2017-03-11 DIAGNOSIS — E119 Type 2 diabetes mellitus without complications: Secondary | ICD-10-CM | POA: Diagnosis not present

## 2017-03-11 DIAGNOSIS — N201 Calculus of ureter: Secondary | ICD-10-CM | POA: Insufficient documentation

## 2017-03-11 DIAGNOSIS — Z01818 Encounter for other preprocedural examination: Secondary | ICD-10-CM | POA: Diagnosis not present

## 2017-03-11 DIAGNOSIS — Z01812 Encounter for preprocedural laboratory examination: Secondary | ICD-10-CM | POA: Insufficient documentation

## 2017-03-11 LAB — COMPREHENSIVE METABOLIC PANEL
ALBUMIN: 4.3 g/dL (ref 3.5–5.0)
ALK PHOS: 58 U/L (ref 38–126)
ALT: 29 U/L (ref 14–54)
AST: 29 U/L (ref 15–41)
Anion gap: 11 (ref 5–15)
BILIRUBIN TOTAL: 0.3 mg/dL (ref 0.3–1.2)
BUN: 22 mg/dL — AB (ref 6–20)
CALCIUM: 10 mg/dL (ref 8.9–10.3)
CO2: 25 mmol/L (ref 22–32)
Chloride: 104 mmol/L (ref 101–111)
Creatinine, Ser: 1.07 mg/dL — ABNORMAL HIGH (ref 0.44–1.00)
GFR calc Af Amer: 59 mL/min — ABNORMAL LOW (ref >60)
GFR calc non Af Amer: 51 mL/min — ABNORMAL LOW (ref >60)
GLUCOSE: 99 mg/dL (ref 65–99)
POTASSIUM: 4 mmol/L (ref 3.5–5.1)
Sodium: 140 mmol/L (ref 135–145)
TOTAL PROTEIN: 7.2 g/dL (ref 6.5–8.1)

## 2017-03-11 LAB — CBC
HEMATOCRIT: 40.6 % (ref 36.0–46.0)
Hemoglobin: 13.2 g/dL (ref 12.0–15.0)
MCH: 29.3 pg (ref 26.0–34.0)
MCHC: 32.5 g/dL (ref 30.0–36.0)
MCV: 90 fL (ref 78.0–100.0)
Platelets: 228 10*3/uL (ref 150–400)
RBC: 4.51 MIL/uL (ref 3.87–5.11)
RDW: 14.9 % (ref 11.5–15.5)
WBC: 6.7 10*3/uL (ref 4.0–10.5)

## 2017-03-11 LAB — GLUCOSE, CAPILLARY: Glucose-Capillary: 96 mg/dL (ref 65–99)

## 2017-03-11 NOTE — Progress Notes (Signed)
.......  26-Sep-1946  patient's date of birth is  Your patient has screened at an elevated risk for obstructive sleep apnea using the STOP-Bang tool during a presurgical visit. A score of 5 or greater is an elevated risk.

## 2017-03-11 NOTE — Progress Notes (Signed)
01/13/17- LOV-PCP on chart  EKG-05/2016-epic

## 2017-03-12 LAB — HEMOGLOBIN A1C
Hgb A1c MFr Bld: 6.7 % — ABNORMAL HIGH (ref 4.8–5.6)
Mean Plasma Glucose: 146 mg/dL

## 2017-03-17 NOTE — H&P (Signed)
CC: I have kidney stones.  HPI: [name] is a 71 year-old female patient who was referred by Dewayne Shorter, PA who is here for renal calculi.  The problem is on both sides. She first stated noticing pain on approximately 12/17/2016. This is not her first kidney stone. She has had more than 5 stones prior to getting this one. She is currently having back pain. She denies having flank pain, groin pain, nausea, vomiting, fever, and chills. She has not caught a stone in her urine strainer since her symptoms began.   She has had eswl for treatment of her stones in the past.   [name] is a former patient who I last saw in 2011 for stones. She is sent back by Dewayne Shorter PA for a LLP stone and right ureteral stone seen on KUB on 2/20. She was seen for gross hematuria but had no pain initially. She now has some moderate left lower back pain. She continues to see blood in the urine as recently as yesterday. she has had no urgency but she has some chronic nocturia.      ALLERGIES: No Allergies    MEDICATIONS: Omeprazole 20 mg capsule,delayed release  Simvastatin 20 mg tablet  Anastrozole 1 mg tablet  Aspirin 81 MG TABS Oral  Fenofibrate 160 mg tablet  Fish Oil CAPS Oral  Iron 325 mg (65 mg iron) tablet  Losartan-Hydrochlorothiazide 50 mg-12.5 mg tablet  MetFORMIN HCl - 1000 MG Oral Tablet Oral  Multi-Vitamin TABS Oral  Tylenol CAPS Oral  Vitamin D3 1,000 unit capsule     GU PSH: ESWL - 2011 Hysterectomy Unilat SO - 2011      PSH Notes: Hysterectomy, Lithotripsy,screws left foot, screws left arm from fracture   NON-GU PSH: Breast lumpectomy, Left, x 2 Carpal tunnel surgery Foot surgery (unspecified)    GU PMH: Calculus Ureter, Ureteral Stone - 2014 Other microscopic hematuria, Microscopic hematuria - 2014 Personal Hx urinary calculi, Nephrolithiasis - 2014      PMH Notes:  1898-11-16 00:00:00 - Note: Normal Routine History And Physical Adult  2010-02-26 11:14:48 - Note:  Arthritis   NON-GU PMH: Personal history of other diseases of the circulatory system, History of hypertension - 2014 Personal history of other diseases of the digestive system, History of esophageal reflux - 2014 Personal history of other endocrine, nutritional and metabolic disease, History of diabetes mellitus - 2014, History of hypercholesterolemia, - 2014 Personal history of other infectious and parasitic diseases, History of hepatitis - 2014 Arthritis Cataract GERD Malignant 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   Tobacco Use Assessment Completed: Used Tobacco in last 30 days? Has never drank.  Patient's occupation is/was retired.     Notes: 2 sons   REVIEW OF SYSTEMS:    GU Review Female:   Patient reports frequent urination, hard to postpone urination, and get up at night to urinate. Patient denies burning /pain with urination, leakage of urine, stream starts and stops, trouble starting your stream, have to strain to urinate, and currently pregnant.  Gastrointestinal (Upper):   Patient reports indigestion/ heartburn. Patient denies vomiting and nausea.  Gastrointestinal (Lower):   Patient denies diarrhea and constipation.  Constitutional:   Patient denies fever, night sweats, weight loss, and fatigue.  Skin:   Patient denies skin rash/ lesion and itching.  Eyes:   Patient denies blurred  vision and double vision.  Ears/ Nose/ Throat:   Patient denies sore throat and sinus problems.  Hematologic/Lymphatic:   Patient reports easy bruising. Patient denies swollen glands.  Cardiovascular:   Patient denies leg swelling and chest pains.  Respiratory:   Patient denies cough and shortness of breath.  Endocrine:   Patient denies excessive thirst.  Musculoskeletal:   Patient reports joint pain. Patient denies back pain.   Neurological:   Patient denies headaches and dizziness.  Psychologic:   Patient denies depression and anxiety.   VITAL SIGNS:      03/01/2017 09:27 AM  Weight 185 lb / 83.91 kg  Height 64.5 in / 163.83 cm  BP 123/74 mmHg  Pulse 85 /min  Temperature 97.0 F / 36 C  BMI 31.3 kg/m   MULTI-SYSTEM PHYSICAL EXAMINATION:    Constitutional: Well-nourished. No physical deformities. Normally developed. Good grooming.  Neck: Neck symmetrical, not swollen. Normal tracheal position.  Respiratory: No labored breathing, no use of accessory muscles. CTA  Cardiovascular: Normal temperature,RRR without murmur  Lymphatic: No enlargement of neck, axillae, groin.  Skin: No paleness, no jaundice, no cyanosis. No lesion, no ulcer, no rash.  Neurologic / Psychiatric: Oriented to time, oriented to place, oriented to person. No depression, no anxiety, no agitation.  Gastrointestinal: Abdominal tenderness LLQ and LCVAT, obese. No mass, no rigidity.   Musculoskeletal: Normal gait and station of head and neck.     PAST DATA REVIEWED:  Source Of History:  Patient  Records Review:   Previous Doctor Records, Previous Patient Records  Urine Test Review:   Urinalysis  X-Ray Review: KUB: Reviewed Films. Reviewed Report. Discussed With Patient.  C.T. Stone Protocol: Reviewed Films. Discussed With Patient.     PROCEDURES:         C.T. Urogram - [id number]  She has an 59mm RUPJ stone without obstruction and a 75mm LLP branched calculus. There is a possible 64mm left distal stone without obstruction and a possible left distal stone vs phlebolith without obstruction.                Urinalysis w/Scope - 81001 Dipstick Dipstick Cont'd Micro  Color: Yellow Bilirubin: Neg WBC/hpf: 0 - 5/hpf  Appearance: Cloudy Ketones: Neg RBC/hpf: >60/hpf  Specific Gravity: 1.025 Blood: 3+ Bacteria: Rare (0-9/hpf)  pH: 6.5 Protein: Neg Cystals: NS (Not Seen)  Glucose: Neg Urobilinogen: 0.2 Casts: NS (Not Seen)    Nitrites: Neg  Trichomonas: Not Present    Leukocyte Esterase: Neg Mucous: Not Present      Epithelial Cells: 0 - 5/hpf      Yeast: NS (Not Seen)      Sperm: Not Present    ASSESSMENT:      ICD-10 Details  1 GU:   Kidney Stone - N20.0 She has a 23mm LLP stone and bilateral ureteral stones. I am going to start her on tamsulosin and reviewed the treatment options in detail. I am going to get her set up for cystoscopy with bil retrogrades, bilateral ureteroscopy and stents for the ureteral stones and will reassess the LLP stone after that is done. I reviewed the risks of bleeding, infection, ureteral injury, need for stent and secondary procedures, thrombotic events and anesthetic complications.   2   Calculus Ureter - N20.1    PLAN:            Medications New Meds: Tamsulosin Hcl 0.4 mg capsule, ext release 24 hr 1 capsule PO Daily   #30  1 Refill(s)  Stop Meds: Alendronate Sodium 70 MG Oral Tablet Oral  Start: 02/26/2010  Discontinue: 03/01/2017  - Reason: The medication cycle was completed.  Lisinopril-Hydrochlorothiazide 10-12.5 MG Oral Tablet Oral  Start: 02/26/2010  Discontinue: 03/01/2017  - Reason: The medication cycle was completed.  Oxycodone-Acetaminophen 5-325 MG Oral Tablet Oral  Start: 02/26/2010  Discontinue: 03/01/2017  - Reason: The medication cycle was completed.  Rapaflo 8 MG Oral Capsule 1 Oral Daily  Start: 02/26/2010  Discontinue: 03/01/2017  - Reason: The medication cycle was completed.  Simvastatin 80 MG Oral Tablet Oral  Start: 02/26/2010  Discontinue: 03/01/2017  - Reason: The medication cycle was completed.  Vitamin D TABS Oral  Start: 02/26/2010  Discontinue: 03/01/2017  - Reason: The medication cycle was completed.  Zofran TABS Oral  Start: 02/26/2010  Discontinue: 03/01/2017  - Reason: The medication cycle was completed.            Orders X-Rays: C.T. Stone Protocol Without Contrast  X-Ray Notes: History:  Hematuria: Yes/No  Patient to see MD after exam:  Yes/No  Previous exam: CT / IVP/ US/ KUB/ None  When:  Where:  Diabetic: Yes/ No  BUN/ Creatinine:  Date of last BUN Creatinine:  Weight in pounds:  Allergy- IV Contrast: Yes/ No  Conflicting diabetic meds: Yes/ No  Diabetic Meds:  Prior Authorization #: [id number]           Schedule Return Visit/Planned Activity: Next Available Appointment - Schedule Surgery

## 2017-03-18 ENCOUNTER — Ambulatory Visit (HOSPITAL_COMMUNITY)
Admission: RE | Admit: 2017-03-18 | Discharge: 2017-03-18 | Disposition: A | Discharge status: Home / Self Care | Payer: PPO | Source: Ambulatory Visit | Attending: Urology | Admitting: Urology

## 2017-03-18 ENCOUNTER — Ambulatory Visit (HOSPITAL_COMMUNITY): Payer: PPO | Admitting: Anesthesiology

## 2017-03-18 ENCOUNTER — Ambulatory Visit (HOSPITAL_COMMUNITY): Payer: PPO

## 2017-03-18 ENCOUNTER — Encounter (HOSPITAL_COMMUNITY)
Admission: RE | Disposition: A | Discharge status: Home / Self Care | Payer: Self-pay | Source: Ambulatory Visit | Attending: Urology

## 2017-03-18 ENCOUNTER — Encounter (HOSPITAL_COMMUNITY): Payer: Self-pay | Admitting: *Deleted

## 2017-03-18 DIAGNOSIS — K219 Gastro-esophageal reflux disease without esophagitis: Secondary | ICD-10-CM | POA: Diagnosis not present

## 2017-03-18 DIAGNOSIS — Z87442 Personal history of urinary calculi: Secondary | ICD-10-CM | POA: Insufficient documentation

## 2017-03-18 DIAGNOSIS — Z8619 Personal history of other infectious and parasitic diseases: Secondary | ICD-10-CM | POA: Insufficient documentation

## 2017-03-18 DIAGNOSIS — I10 Essential (primary) hypertension: Secondary | ICD-10-CM | POA: Diagnosis not present

## 2017-03-18 DIAGNOSIS — M1990 Unspecified osteoarthritis, unspecified site: Secondary | ICD-10-CM | POA: Insufficient documentation

## 2017-03-18 DIAGNOSIS — Z466 Encounter for fitting and adjustment of urinary device: Secondary | ICD-10-CM | POA: Diagnosis not present

## 2017-03-18 DIAGNOSIS — Z833 Family history of diabetes mellitus: Secondary | ICD-10-CM | POA: Insufficient documentation

## 2017-03-18 DIAGNOSIS — N202 Calculus of kidney with calculus of ureter: Secondary | ICD-10-CM | POA: Diagnosis not present

## 2017-03-18 DIAGNOSIS — Z87891 Personal history of nicotine dependence: Secondary | ICD-10-CM | POA: Diagnosis not present

## 2017-03-18 DIAGNOSIS — Z7982 Long term (current) use of aspirin: Secondary | ICD-10-CM | POA: Insufficient documentation

## 2017-03-18 DIAGNOSIS — Z90710 Acquired absence of both cervix and uterus: Secondary | ICD-10-CM | POA: Diagnosis not present

## 2017-03-18 DIAGNOSIS — Z7984 Long term (current) use of oral hypoglycemic drugs: Secondary | ICD-10-CM | POA: Diagnosis not present

## 2017-03-18 DIAGNOSIS — E119 Type 2 diabetes mellitus without complications: Secondary | ICD-10-CM | POA: Diagnosis not present

## 2017-03-18 DIAGNOSIS — D649 Anemia, unspecified: Secondary | ICD-10-CM | POA: Insufficient documentation

## 2017-03-18 DIAGNOSIS — N132 Hydronephrosis with renal and ureteral calculous obstruction: Secondary | ICD-10-CM | POA: Diagnosis not present

## 2017-03-18 DIAGNOSIS — N201 Calculus of ureter: Secondary | ICD-10-CM | POA: Diagnosis not present

## 2017-03-18 DIAGNOSIS — Z853 Personal history of malignant neoplasm of breast: Secondary | ICD-10-CM | POA: Insufficient documentation

## 2017-03-18 DIAGNOSIS — K449 Diaphragmatic hernia without obstruction or gangrene: Secondary | ICD-10-CM | POA: Insufficient documentation

## 2017-03-18 DIAGNOSIS — Z79899 Other long term (current) drug therapy: Secondary | ICD-10-CM | POA: Insufficient documentation

## 2017-03-18 HISTORY — PX: CYSTOSCOPY/URETEROSCOPY/HOLMIUM LASER/STENT PLACEMENT: SHX6546

## 2017-03-18 LAB — GLUCOSE, CAPILLARY
GLUCOSE-CAPILLARY: 119 mg/dL — AB (ref 65–99)
Glucose-Capillary: 127 mg/dL — ABNORMAL HIGH (ref 65–99)

## 2017-03-18 SURGERY — CYSTOSCOPY/URETEROSCOPY/HOLMIUM LASER/STENT PLACEMENT
Anesthesia: General | Laterality: Bilateral

## 2017-03-18 MED ORDER — FENTANYL CITRATE (PF) 100 MCG/2ML IJ SOLN
INTRAMUSCULAR | Status: DC | PRN
  Administered 2017-03-18 (×2): 50 ug via INTRAVENOUS
  Administered 2017-03-18: 100 ug via INTRAVENOUS

## 2017-03-18 MED ORDER — PROPOFOL 10 MG/ML IV BOLUS
INTRAVENOUS | Status: AC
  Filled 2017-03-18: qty 20

## 2017-03-18 MED ORDER — ACETAMINOPHEN 325 MG PO TABS: 650.0000 mg | ORAL_TABLET | ORAL | Status: DC | PRN

## 2017-03-18 MED ORDER — SODIUM CHLORIDE 0.9% FLUSH: 3.0000 mL | Freq: Two times a day (BID) | INTRAVENOUS | Status: DC

## 2017-03-18 MED ORDER — PHENYLEPHRINE 40 MCG/ML (10ML) SYRINGE FOR IV PUSH (FOR BLOOD PRESSURE SUPPORT)
PREFILLED_SYRINGE | INTRAVENOUS | Status: AC
  Filled 2017-03-18: qty 10

## 2017-03-18 MED ORDER — MORPHINE SULFATE (PF) 4 MG/ML IV SOLN: 2.0000 mg | INTRAVENOUS | Status: DC | PRN

## 2017-03-18 MED ORDER — CEFAZOLIN SODIUM-DEXTROSE 2-4 GM/100ML-% IV SOLN
2.0000 g | INTRAVENOUS | Status: AC
  Administered 2017-03-18: 2 g via INTRAVENOUS
  Filled 2017-03-18: qty 100

## 2017-03-18 MED ORDER — IOHEXOL 300 MG/ML  SOLN
INTRAMUSCULAR | Status: DC | PRN
  Administered 2017-03-18: 11 mL via INTRAVENOUS

## 2017-03-18 MED ORDER — SODIUM CHLORIDE 0.9 % IV SOLN: 250.0000 mL | INTRAVENOUS | Status: DC | PRN

## 2017-03-18 MED ORDER — CEPHALEXIN 500 MG PO CAPS: 500.0000 mg | ORAL_CAPSULE | Freq: Three times a day (TID) | ORAL | 0 refills | Status: DC

## 2017-03-18 MED ORDER — ONDANSETRON HCL 4 MG/2ML IJ SOLN
INTRAMUSCULAR | Status: DC | PRN
  Administered 2017-03-18: 4 mg via INTRAVENOUS

## 2017-03-18 MED ORDER — FENTANYL CITRATE (PF) 100 MCG/2ML IJ SOLN
INTRAMUSCULAR | Status: AC
  Filled 2017-03-18: qty 2

## 2017-03-18 MED ORDER — LACTATED RINGERS IV SOLN
INTRAVENOUS | Status: DC
  Administered 2017-03-18: 09:00:00 via INTRAVENOUS

## 2017-03-18 MED ORDER — HYDROCODONE-ACETAMINOPHEN 5-325 MG PO TABS: 1.0000 | ORAL_TABLET | Freq: Four times a day (QID) | ORAL | 0 refills | Status: DC | PRN

## 2017-03-18 MED ORDER — HYDROMORPHONE HCL 1 MG/ML IJ SOLN: 0.2500 mg | INTRAMUSCULAR | Status: DC | PRN

## 2017-03-18 MED ORDER — SODIUM CHLORIDE 0.9% FLUSH: 3.0000 mL | INTRAVENOUS | Status: DC | PRN

## 2017-03-18 MED ORDER — OXYCODONE HCL 5 MG PO TABS: 5.0000 mg | ORAL_TABLET | ORAL | Status: DC | PRN

## 2017-03-18 MED ORDER — PROMETHAZINE HCL 25 MG/ML IJ SOLN: 6.2500 mg | INTRAMUSCULAR | Status: DC | PRN

## 2017-03-18 MED ORDER — PROPOFOL 10 MG/ML IV BOLUS
INTRAVENOUS | Status: DC | PRN
  Administered 2017-03-18: 150 mg via INTRAVENOUS

## 2017-03-18 MED ORDER — ACETAMINOPHEN 650 MG RE SUPP
650.0000 mg | RECTAL | Status: DC | PRN
  Filled 2017-03-18: qty 1

## 2017-03-18 MED ORDER — LIDOCAINE 2% (20 MG/ML) 5 ML SYRINGE
INTRAMUSCULAR | Status: DC | PRN
  Administered 2017-03-18: 60 mg via INTRAVENOUS

## 2017-03-18 MED ORDER — SODIUM CHLORIDE 0.9 % IR SOLN
Status: DC | PRN
  Administered 2017-03-18: 4000 mL

## 2017-03-18 MED ORDER — EPHEDRINE 5 MG/ML INJ
INTRAVENOUS | Status: AC
  Filled 2017-03-18: qty 10

## 2017-03-18 MED ORDER — MEPERIDINE HCL 50 MG/ML IJ SOLN: 6.2500 mg | INTRAMUSCULAR | Status: DC | PRN

## 2017-03-18 SURGICAL SUPPLY — 23 items
BAG URO CATCHER STRL LF (MISCELLANEOUS) ×2 IMPLANT
BASKET STONE NCOMPASS (UROLOGICAL SUPPLIES) IMPLANT
CATH URET 5FR 28IN OPEN ENDED (CATHETERS) ×1 IMPLANT
CATH URET DUAL LUMEN 6-10FR 50 (CATHETERS) ×2 IMPLANT
CLOTH BEACON ORANGE TIMEOUT ST (SAFETY) ×2 IMPLANT
COVER SURGICAL LIGHT HANDLE (MISCELLANEOUS) ×2 IMPLANT
EXTRACTOR STONE NITINOL NGAGE (UROLOGICAL SUPPLIES) ×3 IMPLANT
FIBER LASER FLEXIVA 1000 (UROLOGICAL SUPPLIES) IMPLANT
FIBER LASER FLEXIVA 365 (UROLOGICAL SUPPLIES) ×1 IMPLANT
FIBER LASER FLEXIVA 550 (UROLOGICAL SUPPLIES) IMPLANT
FIBER LASER TRAC TIP (UROLOGICAL SUPPLIES) ×1 IMPLANT
GLOVE SURG SS PI 8.0 STRL IVOR (GLOVE) IMPLANT
GOWN STRL REUS W/TWL XL LVL3 (GOWN DISPOSABLE) ×2 IMPLANT
GUIDEWIRE STR DUAL SENSOR (WIRE) ×2 IMPLANT
IV NS 1000ML (IV SOLUTION) ×2
IV NS 1000ML BAXH (IV SOLUTION) ×1 IMPLANT
IV NS IRRIG 3000ML ARTHROMATIC (IV SOLUTION) ×2 IMPLANT
MANIFOLD NEPTUNE II (INSTRUMENTS) ×2 IMPLANT
PACK CYSTO (CUSTOM PROCEDURE TRAY) ×2 IMPLANT
SHEATH ACCESS URETERAL 38CM (SHEATH) ×2 IMPLANT
SHEATH URET ACCESS 10/12FR (MISCELLANEOUS) IMPLANT
STENT URET 6FRX24 CONTOUR (STENTS) ×1 IMPLANT
TUBING CONNECTING 10 (TUBING) ×2 IMPLANT

## 2017-03-18 NOTE — Transfer of Care (Signed)
Immediate Anesthesia Transfer of Care Note  Patient: [name]  Procedure(s) Performed: Procedure(s): CYSTOSCOPY/ BILATERAL RETROGRADE/RIGHT URETEROSCOPY/ RIGHT HOLMIUM LASER APPLICATION/RIGHT URETERAL STENT PLACEMENT (Bilateral)  Patient Location: PACU  Anesthesia Type:General  Level of Consciousness: awake, alert  and oriented  Airway & Oxygen Therapy: Patient Spontanous Breathing and Patient connected to face mask oxygen  Post-op Assessment: Report given to RN and Post -op Vital signs reviewed and stable  Post vital signs: Reviewed and stable  Last Vitals:  Vitals:   03/18/17 0814  BP: (!) 163/83  Pulse: 88  Resp: 16  Temp: 36.5 C    Last Pain:  Vitals:   03/18/17 0814  TempSrc: Oral         Complications: No apparent anesthesia complications

## 2017-03-18 NOTE — Anesthesia Procedure Notes (Signed)
Procedure Name: LMA Insertion Performed by: Chimamanda Siegfried J Pre-anesthesia Checklist: Patient identified, Emergency Drugs available, Suction available, Patient being monitored and Timeout performed Patient Re-evaluated:Patient Re-evaluated prior to inductionOxygen Delivery Method: Circle system utilized Preoxygenation: Pre-oxygenation with 100% oxygen Intubation Type: IV induction Ventilation: Mask ventilation without difficulty LMA: LMA inserted LMA Size: 4.0 Number of attempts: 1 Placement Confirmation: positive ETCO2,  CO2 detector and breath sounds checked- equal and bilateral Tube secured with: Tape Dental Injury: Teeth and Oropharynx as per pre-operative assessment        

## 2017-03-18 NOTE — Anesthesia Postprocedure Evaluation (Signed)
Anesthesia Post Note  Patient: [name]  Procedure(s) Performed: Procedure(s) (LRB): CYSTOSCOPY/ BILATERAL RETROGRADE/RIGHT URETEROSCOPY/ RIGHT HOLMIUM LASER APPLICATION/RIGHT URETERAL STENT PLACEMENT (Bilateral)  Patient location during evaluation: PACU Anesthesia Type: General Level of consciousness: sedated and patient cooperative Pain management: pain level controlled Vital Signs Assessment: post-procedure vital signs reviewed and stable Respiratory status: spontaneous breathing Cardiovascular status: stable Anesthetic complications: no       Last Vitals:  Vitals:   03/18/17 1215 03/18/17 1300  BP: (!) 157/75 (!) 145/78  Pulse: 86 84  Resp: 13 16  Temp:      Last Pain:  Vitals:   03/18/17 1300  TempSrc:   PainSc: 2                  Nolon Nations

## 2017-03-18 NOTE — Interval H&P Note (Signed)
History and Physical Interval Note:  03/18/2017 9:59 AM  [name]  has presented today for surgery, with the diagnosis of BILATERAL URETERAL STONES  The various methods of treatment have been discussed with the patient and family. After consideration of risks, benefits and other options for treatment, the patient has consented to  Procedure(s): CYSTOSCOPY/RETROGRADE/URETEROSCOPY/HOLMIUM LASER/STENT PLACEMENT (Bilateral) as a surgical intervention .  The patient's history has been reviewed, patient examined, no change in status, stable for surgery.  I have reviewed the patient's chart and labs.  Questions were answered to the patient's satisfaction.     Richanda Darin J

## 2017-03-18 NOTE — Op Note (Deleted)
  The note originally documented on this encounter has been moved the the encounter in which it belongs.  

## 2017-03-18 NOTE — Anesthesia Preprocedure Evaluation (Signed)
Anesthesia Evaluation  Patient identified by MRN, date of birth, ID band Patient awake    Reviewed: Allergy & Precautions, H&P , NPO status , Patient's Chart, lab work & pertinent test results  Airway Mallampati: II  TM Distance: >3 FB Neck ROM: full    Dental  (+) Edentulous Upper, Edentulous Lower   Pulmonary neg pulmonary ROS, former smoker,    breath sounds clear to auscultation       Cardiovascular hypertension, Pt. on medications  Rhythm:Regular Rate:Normal     Neuro/Psych  Neuromuscular disease    GI/Hepatic hiatal hernia, GERD  Medicated and Controlled,(+) Hepatitis -, AHepatitis A as a child.   Endo/Other  diabetes, Type 2, Oral Hypoglycemic Agents  Renal/GU      Musculoskeletal  (+) Arthritis ,   Abdominal   Peds  Hematology  (+) anemia ,   Anesthesia Other Findings   Reproductive/Obstetrics                             Anesthesia Physical  Anesthesia Plan  ASA: III  Anesthesia Plan: General   Post-op Pain Management:    Induction: Intravenous  Airway Management Planned: LMA  Additional Equipment:   Intra-op Plan:   Post-operative Plan: Extubation in OR  Informed Consent: I have reviewed the patients History and Physical, chart, labs and discussed the procedure including the risks, benefits and alternatives for the proposed anesthesia with the patient or authorized representative who has indicated his/her understanding and acceptance.   Dental advisory given  Plan Discussed with: CRNA  Anesthesia Plan Comments:         Anesthesia Quick Evaluation

## 2017-03-18 NOTE — Brief Op Note (Signed)
03/18/2017  11:31 AM  PATIENT:  [name]  71 y.o. female  PRE-OPERATIVE DIAGNOSIS:  BILATERAL URETERAL STONES  POST-OPERATIVE DIAGNOSIS:  RIGHT URETERAL STONES  PROCEDURE:  Procedure(s): CYSTOSCOPY/ BILATERAL RETROGRADE/RIGHT URETEROSCOPY/ RIGHT HOLMIUM LASER APPLICATION/RIGHT URETERAL STENT PLACEMENT SURGEON:  Surgeon(s) and Role:    * Irine Seal, MD - Primary  PHYSICIAN ASSISTANT:   ASSISTANTS: none   ANESTHESIA:   general  EBL:  Total I/O In: 700 [I.V.:700] Out: -   BLOOD ADMINISTERED:none  DRAINS: RIGHT 6 X 24 STENT   LOCAL MEDICATIONS USED:  NONE  SPECIMEN:  Source of Specimen:  STONE FRAGMENTS  DISPOSITION OF SPECIMEN:  RIGHT URETERAL STONE  COUNTS:  CORRECT  TOURNIQUET:  * No tourniquets in log *  DICTATION: .Other Dictation: Dictation Number [id number]  PLAN OF CARE: Discharge to home after PACU  PATIENT DISPOSITION:  PACU - hemodynamically stable.   Delay start of Pharmacological VTE agent (>24hrs) due to surgical blood loss or risk of bleeding: not applicable

## 2017-03-18 NOTE — Op Note (Signed)
[name], [name]                 ACCOUNT NO.:  [id number]  MEDICAL RECORD NO.:  [id number]  LOCATION:                               FACILITY:  [facility]  PHYSICIAN:  Marshall Cork. Jeffie Pollock, M.D.    DATE OF BIRTH:  November 24, 1945  DATE OF PROCEDURE:  03/18/2017 DATE OF DISCHARGE:  03/18/2017                              OPERATIVE REPORT   Patient of Dr. Irine Seal.  PROCEDURE: 1. Cystoscopy with bilateral retrograde pyelograms and interpretation. 2. Right ureteroscopic stone extraction with holmium laser tripsy and     insertion of right double-J stent.  PREOPERATIVE DIAGNOSIS:  Right proximal ureteral and possible left distal ureteral stones.  POSTOPERATIVE DIAGNOSIS:  Right proximal ureteral stone.  SURGEON:  Marshall Cork. Jeffie Pollock, M.D.  ANESTHESIA:  General.  SPECIMEN:  Stone fragments.  DRAINS:  A 6-French x 24 cm right double-J stent.  COMPLICATIONS:  None.  INDICATIONS:  Ms. Hamza is a 71 year old white female,who has an 8 mm right proximal stone.  There was a question of distal ureteral stone on my review of the CT, but not the radiologist, but it was felt that retrograde pyelography was indicated to rule that out.  She does have a 17 mm left lower pole stone, but we are not planning to treat at this time.  FINDINGS OF PROCEDURE:  She was taken to the operating room where she was given Ancef.  A general anesthetic was induced.  She was placed in lithotomy position.  Her perineum and genitalia were prepped with Betadine solution.  She was draped in usual sterile fashion.  Cystoscopy was performed using a 23-French scope and 30-degree lens. Examination revealed a normal urethra.  The ureteral orifices were in their normal anatomic position.  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  No tumors or stones were identified.  The left retrograde pyelogram was performed with a 5-French  open-end catheter.  This revealed a normal ureter and intrarenal collecting system with the exception of branched calculus in the lower pole with infundibular stenosis of the affected calyx.  Right retrograde pyelogram was then performed with Omnipaque and a 5- [place] open-end catheter.  This demonstrated a normal caliber ureter up to the lower proximal ureter where a filling defect consistent with a stone was identified. There was proximal hydronephrosis.  A Sensor guidewire was passed through the open-end catheter and easily bypassed the stone into the kidney.  There was efflux of some turbid urine with evidence suggestive of blood.  At this point, the inner core of a 12, 14, 38 cm access sheath was passed.  I was able to get it up to the level of the stone.  At this point, a 6.5-French semi-rigid ureteroscope was passed alongside the wire.  The stone was identified and engaged with a 365 micron laser fiber at 0.5 watts and 8 Hz.  The stone was broken partially, but flushed back to the kidney.  I then placed the assembled digital access sheath, but was only able to get into the lower proximal ureter.  The inner core and wire  were removed and the single lumen digital ureteroscope was then passed without difficulty to the kidney.  The stone was identified.  It was engaged with a 200 micron TracTip laser fiber at 0.5 watts and 8 Hz and broken into manageable fragments.  An N-Gage basket was then used to retrieve all significant stone fragments.  Final inspection revealed only minimal size fragments in the kidney.  The ureteroscope was removed.  The wire was inserted to the kidney.  The access sheath was removed.  The cystoscope was reinserted over the wire and a 6-French 24 cm double-J stent without string was inserted to the kidney under fluoroscopic guidance.  The wire was removed leaving good coil in the kidney and good coil in the bladder.  The bladder was drained.  The  patient was taken down from lithotomy position.  Her anesthetic was reversed.  She was moved to the recovery room in stable condition.  There were no complications.     Marshall Cork. Jeffie Pollock, M.D.     JJW/MEDQ  D:  03/18/2017  T:  03/18/2017  Job:  [id number]

## 2017-03-18 NOTE — Discharge Instructions (Signed)
Ureteral Stent Implantation, Care After °Refer to this sheet in the next few weeks. These instructions provide you with information about caring for yourself after your procedure. Your health care provider may also give you more specific instructions. Your treatment has been planned according to current medical practices, but problems sometimes occur. Call your health care provider if you have any problems or questions after your procedure. °What can I expect after the procedure? °After the procedure, it is common to have: °· Nausea. °· Mild pain when you urinate. You may feel this pain in your lower back or lower abdomen. Pain should stop within a few minutes after you urinate. This may last for up to 1 week. °· A small amount of blood in your urine for several days. °Follow these instructions at home: °  °Medicines  °· Take over-the-counter and prescription medicines only as told by your health care provider. °· If you were prescribed an antibiotic medicine, take it as told by your health care provider. Do not stop taking the antibiotic even if you start to feel better. °· Do not drive for 24 hours if you received a sedative. °· Do not drive or operate heavy machinery while taking prescription pain medicines. °Activity  °· Return to your normal activities as told by your health care provider. Ask your health care provider what activities are safe for you. °· Do not lift anything that is heavier than 10 lb (4.5 kg). Follow this limit for 1 week after your procedure, or for as long as told by your health care provider. °General instructions  °· Watch for any blood in your urine. Call your health care provider if the amount of blood in your urine increases. °· If you have a catheter: °¨ Follow instructions from your health care provider about taking care of your catheter and collection bag. °¨ Do not take baths, swim, or use a hot tub until your health care provider approves. °· Drink enough fluid to keep your urine  clear or pale yellow. °· Keep all follow-up visits as told by your health care provider. This is important. °Contact a health care provider if: °· You have pain that gets worse or does not get better with medicine, especially pain when you urinate. °· You have difficulty urinating. °· You feel nauseous or you vomit repeatedly during a period of more than 2 days after the procedure. °Get help right away if: °· Your urine is dark red or has blood clots in it. °· You are leaking urine (have incontinence). °· The end of the stent comes out of your urethra. °· You cannot urinate. °· You have sudden, sharp, or severe pain in your abdomen or lower back. °· You have a fever. °This information is not intended to replace advice given to you by your health care provider. Make sure you discuss any questions you have with your health care provider. °Document Released: 07/05/2013 Document Revised: 04/09/2016 Document Reviewed: 05/17/2015 °Elsevier Interactive Patient Education © 2017 [employer] ° °

## 2017-03-19 ENCOUNTER — Encounter (HOSPITAL_COMMUNITY): Payer: Self-pay | Admitting: Urology

## 2017-04-01 DIAGNOSIS — N200 Calculus of kidney: Secondary | ICD-10-CM | POA: Diagnosis not present

## 2017-04-01 DIAGNOSIS — N202 Calculus of kidney with calculus of ureter: Secondary | ICD-10-CM | POA: Diagnosis not present

## 2017-05-04 ENCOUNTER — Ambulatory Visit (HOSPITAL_COMMUNITY)
Admission: RE | Admit: 2017-05-04 | Discharge: 2017-05-04 | Disposition: A | Discharge status: Home / Self Care | Payer: PPO | Source: Ambulatory Visit | Attending: Oncology | Admitting: Oncology

## 2017-05-04 DIAGNOSIS — Z853 Personal history of malignant neoplasm of breast: Secondary | ICD-10-CM | POA: Insufficient documentation

## 2017-05-04 DIAGNOSIS — R928 Other abnormal and inconclusive findings on diagnostic imaging of breast: Secondary | ICD-10-CM | POA: Diagnosis not present

## 2017-05-04 DIAGNOSIS — Z9889 Other specified postprocedural states: Secondary | ICD-10-CM | POA: Diagnosis not present

## 2017-05-04 DIAGNOSIS — C50312 Malignant neoplasm of lower-inner quadrant of left female breast: Secondary | ICD-10-CM

## 2017-05-04 DIAGNOSIS — Z170 Estrogen receptor positive status [ER+]: Secondary | ICD-10-CM

## 2017-05-13 DIAGNOSIS — N202 Calculus of kidney with calculus of ureter: Secondary | ICD-10-CM | POA: Diagnosis not present

## 2017-06-30 ENCOUNTER — Other Ambulatory Visit (HOSPITAL_COMMUNITY): Payer: PPO

## 2017-06-30 ENCOUNTER — Ambulatory Visit (HOSPITAL_COMMUNITY): Payer: PPO

## 2017-07-06 ENCOUNTER — Other Ambulatory Visit (HOSPITAL_COMMUNITY): Payer: Self-pay

## 2017-07-06 DIAGNOSIS — C50312 Malignant neoplasm of lower-inner quadrant of left female breast: Secondary | ICD-10-CM

## 2017-07-06 DIAGNOSIS — Z170 Estrogen receptor positive status [ER+]: Principal | ICD-10-CM

## 2017-07-06 MED ORDER — ANASTROZOLE 1 MG PO TABS: 1.0000 mg | ORAL_TABLET | Freq: Every day | ORAL | 1 refills | Status: DC

## 2017-07-06 NOTE — Telephone Encounter (Signed)
Received refill request from patient for Arimidex. Reviewed with provider, chart checked and refilled.  

## 2017-07-21 DIAGNOSIS — I10 Essential (primary) hypertension: Secondary | ICD-10-CM | POA: Diagnosis not present

## 2017-07-21 DIAGNOSIS — E119 Type 2 diabetes mellitus without complications: Secondary | ICD-10-CM | POA: Diagnosis not present

## 2017-07-21 DIAGNOSIS — Z23 Encounter for immunization: Secondary | ICD-10-CM | POA: Diagnosis not present

## 2017-07-21 DIAGNOSIS — N1831 Chronic kidney disease, stage 3a: Secondary | ICD-10-CM | POA: Insufficient documentation

## 2017-07-21 DIAGNOSIS — M791 Myalgia: Secondary | ICD-10-CM | POA: Diagnosis not present

## 2017-07-21 DIAGNOSIS — K219 Gastro-esophageal reflux disease without esophagitis: Secondary | ICD-10-CM | POA: Diagnosis not present

## 2017-07-21 DIAGNOSIS — E782 Mixed hyperlipidemia: Secondary | ICD-10-CM | POA: Diagnosis not present

## 2017-08-04 ENCOUNTER — Encounter (HOSPITAL_BASED_OUTPATIENT_CLINIC_OR_DEPARTMENT_OTHER): Payer: PPO | Admitting: Oncology

## 2017-08-04 ENCOUNTER — Encounter (HOSPITAL_COMMUNITY): Payer: Self-pay | Admitting: Oncology

## 2017-08-04 ENCOUNTER — Encounter (HOSPITAL_COMMUNITY): Payer: PPO | Attending: Oncology

## 2017-08-04 VITALS — BP 145/62 | HR 100 | Resp 16 | Ht 64.5 in | Wt 181.0 lb

## 2017-08-04 DIAGNOSIS — C50312 Malignant neoplasm of lower-inner quadrant of left female breast: Secondary | ICD-10-CM

## 2017-08-04 DIAGNOSIS — Z170 Estrogen receptor positive status [ER+]: Secondary | ICD-10-CM | POA: Diagnosis not present

## 2017-08-04 LAB — CBC WITH DIFFERENTIAL/PLATELET
Basophils Absolute: 0 10*3/uL (ref 0.0–0.1)
Basophils Relative: 0 %
Eosinophils Absolute: 0.1 10*3/uL (ref 0.0–0.7)
Eosinophils Relative: 1 %
HEMATOCRIT: 42.9 % (ref 36.0–46.0)
Hemoglobin: 14.3 g/dL (ref 12.0–15.0)
LYMPHS ABS: 1.5 10*3/uL (ref 0.7–4.0)
LYMPHS PCT: 21 %
MCH: 29.7 pg (ref 26.0–34.0)
MCHC: 33.3 g/dL (ref 30.0–36.0)
MCV: 89 fL (ref 78.0–100.0)
MONO ABS: 0.6 10*3/uL (ref 0.1–1.0)
MONOS PCT: 9 %
NEUTROS ABS: 4.8 10*3/uL (ref 1.7–7.7)
Neutrophils Relative %: 69 %
Platelets: 253 10*3/uL (ref 150–400)
RBC: 4.82 MIL/uL (ref 3.87–5.11)
RDW: 14.9 % (ref 11.5–15.5)
WBC: 7 10*3/uL (ref 4.0–10.5)

## 2017-08-04 LAB — COMPREHENSIVE METABOLIC PANEL
ALT: 36 U/L (ref 14–54)
AST: 37 U/L (ref 15–41)
Albumin: 4.4 g/dL (ref 3.5–5.0)
Alkaline Phosphatase: 44 U/L (ref 38–126)
Anion gap: 10 (ref 5–15)
BILIRUBIN TOTAL: 0.4 mg/dL (ref 0.3–1.2)
BUN: 24 mg/dL — AB (ref 6–20)
CO2: 25 mmol/L (ref 22–32)
CREATININE: 1.16 mg/dL — AB (ref 0.44–1.00)
Calcium: 10 mg/dL (ref 8.9–10.3)
Chloride: 101 mmol/L (ref 101–111)
GFR calc Af Amer: 53 mL/min — ABNORMAL LOW (ref >60)
GFR, EST NON AFRICAN AMERICAN: 46 mL/min — AB (ref >60)
Glucose, Bld: 139 mg/dL — ABNORMAL HIGH (ref 65–99)
Potassium: 3.9 mmol/L (ref 3.5–5.1)
Sodium: 136 mmol/L (ref 135–145)
TOTAL PROTEIN: 7.4 g/dL (ref 6.5–8.1)

## 2017-08-04 NOTE — Progress Notes (Signed)
Dewayne Shorter, [place] [address]  Malignant neoplasm of lower-inner quadrant of left breast in female, estrogen receptor positive ([place])  CURRENT THERAPY: Arimidex beginning on 10/16/2015  INTERVAL HISTORY: DARREN NODAL 71 y.o. 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 Arnoldo Morale on 06/26/2015, followed by XRT by Dr. Isidore Moos from 09/10/2015- 10/08/2015, and now on Arimidex beginning on 10/16/2015.    Breast cancer ([place]) (Resolved)   08/16/2015 Initial Diagnosis    Breast cancer ([place])      09/11/2015 -  Radiation Therapy    Dr. 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 ER+ 100%, PR+ 80%      05/02/2015 Procedure    Left breast stereotactic core needle biopsy      06/26/2015 Oncotype testing    ONCOTYPE DX assay with recurrence score result of 5, Low risk, 10 year risk of distance recurrence tamoxifen alone 5%      06/26/2015 Definitive Surgery    Breast partial mastectomy after needle localization with Dr. Arnoldo Morale no sentinel node performed      06/26/2015 Pathology Results    INVASIVE DUCTAL CARCINOMA, 2 CM. - HIGH GRADE DUCTAL CARCINOMA IN SITU. - INVASIVE CARCINOMA FOCALLY LESS THAN 0.1 CM FROM LATERAL MARGIN. - DUCTAL CARCINOMA IN SITU FOCALLY LESS THAN 0.1 CM FROM THE LATERAL MARGIN.      06/26/2015 Pathology Results    ER+ 100%, PR + 60%, Ki-67 25%, HER2 NEGATIVE      07/31/2015 Procedure    Sentinel node procedure      07/31/2015 Pathology Results     ONE BENIGN LYMPH NODE (0/1).      08/26/2015 Imaging    Bone density- Normal      09/10/2015 - 10/08/2015 Radiation Therapy    50 Gy left breast by Dr. Isidore Moos.      10/16/2015 -  Anti-estrogen oral therapy    Arimidex      05/08/2016 Procedure    Stereotactic-guided biopsies of the calcifications of left breast.      05/08/2016 Pathology Results    Breast, left, needle core biopsy, lateral and ant to lumpectomy site FIBROCYSTIC CHANGES VASCULAR CALCIFICATIONS NO EVIDENCE OF MALIGNANCY      05/08/2016 Pathology Results    2. Breast, left, needle core biopsy, lumpectomy site FAT NECROSIS WITH FIBROSIS AND MICROCALCIFICATIONS NO EVIDENCE OF MALIGNANCY      06/09/2016 Surgery    Left breast biopsy for microcalcifications. Path: benign.      She presented for follow up today. She is doing well and tolerating Arimidex without any complaints including hot flashes, arthralgias. Appetite is good. No pain anywhere. Patient states that she's been having problems with kidney stones, and she's currently seeing her urologist to get them taken care of.  Review of Systems  Constitutional: Negative.  Negative for chills, fever and weight loss.  HENT: Negative.  Negative for hearing loss, sore throat and tinnitus.   Eyes: Negative.  Negative for blurred  vision, photophobia and discharge.  Respiratory: Negative.  Negative for cough, hemoptysis, shortness of breath and wheezing.   Cardiovascular: Negative.  Negative for chest pain, palpitations, orthopnea, claudication and leg swelling.  Gastrointestinal: Negative.  Negative for abdominal pain, constipation, diarrhea, melena, nausea and vomiting.  Genitourinary: Negative.  Negative for dysuria and hematuria.       Kidney stones  Musculoskeletal: Negative.  Negative for back pain, joint pain and myalgias.  Skin: Negative.  Negative for itching and rash.  Neurological: Negative.  Negative for dizziness, weakness and headaches.    Endo/Heme/Allergies: Negative.  Negative for environmental allergies and polydipsia. Does not bruise/bleed easily.  Psychiatric/Behavioral: Negative.  Negative for depression. The patient is not nervous/anxious and does not have insomnia.     Past Medical History:  Diagnosis Date  . Anemia    hx of   . Arthritis   . Breast cancer ([place]) 06/26/15   left  . Diabetes mellitus    type II   . Disorder of bone and cartilage, unspecified   . Essential hypertension, benign   . GERD (gastroesophageal reflux disease)   . Hepatitis    Hep A as a child  . History of kidney stones   . Nonspecific abnormal finding in stool contents 01/11/2012  . Other and unspecified hyperlipidemia     Past Surgical History:  Procedure Laterality Date  . ABDOMINAL HYSTERECTOMY    . AXILLARY SENTINEL NODE BIOPSY Left 07/31/2015   Procedure: SENTINEL LYMPH NODE BIOPSY, LEFT AXILLA, ;  Surgeon: Aviva Signs, MD;  Location: [facility];  Service: General;  Laterality: Left;  Sentinel Node @ 1000  . BREAST BIOPSY Left 06/09/2016   Procedure: LEFT BREAST BIOPSY AFTER NEEDLE LOCALIZATION;  Surgeon: Aviva Signs, MD;  Location: [facility];  Service: General;  Laterality: Left;  . CARPAL TUNNEL RELEASE Left 11/02/2014   Procedure: CARPAL TUNNEL RELEASE;  Surgeon: Charlotte Crumb, MD;  Location: [facility];  Service: Orthopedics;  Laterality: Left;  . CATARACT EXTRACTION Bilateral 2007  . COLONOSCOPY N/A 02/03/2017   Procedure: COLONOSCOPY;  Surgeon: Danie Binder, MD;  [facility];  Service: Endoscopy;  Laterality: N/A;  1245  . COLONOSCOPY W/ BIOPSIES AND POLYPECTOMY    . CYSTOSCOPY/URETEROSCOPY/HOLMIUM LASER/STENT PLACEMENT Bilateral 03/18/2017   Procedure: CYSTOSCOPY/ BILATERAL RETROGRADE/RIGHT URETEROSCOPY/ RIGHT HOLMIUM LASER APPLICATION/RIGHT URETERAL STENT PLACEMENT;  Surgeon: Irine Seal, MD;  Location: [facility];  Service: Urology;  Laterality: Bilateral;  . FOOT SURGERY Left 2008   fractured foot  . LITHOTRIPSY     . OPEN REDUCTION INTERNAL FIXATION (ORIF) DISTAL RADIAL FRACTURE Left 11/02/2014   Procedure: OPEN REDUCTION INTERNAL FIXATION (ORIF) DISTAL RADIAL FRACTURE;  Surgeon: Charlotte Crumb, MD;  Location: [facility];  Service: Orthopedics;  Laterality: Left;  . PARTIAL MASTECTOMY WITH NEEDLE LOCALIZATION Left 06/26/2015   Procedure: PARTIAL MASTECTOMY WITH NEEDLE LOCALIZATION;  Surgeon: Aviva Signs, MD;  Location: [facility];  Service: General;  Laterality: Left;  Needle Loc @ 8:00am  . POLYPECTOMY  02/03/2017   Procedure: POLYPECTOMY;  Surgeon: Danie Binder, MD;  [facility];  Service: Endoscopy;;  descending and hepatic flexure    Family History  Problem Relation Age of Onset  . Hypertension Mother   . Osteoporosis Mother   . Diabetes Sister        x3  . Colon cancer Neg Hx     Social History   Social History  . Marital status: Married    Spouse name: N/A  . Number of children:  2  . Years of education: N/A   Occupational History  . Retired    Social History Main Topics  . Smoking status: Former Smoker    Packs/day: 1.00    Years: 20.00    Types: Cigarettes    Quit date: 06/05/1986  . Smokeless tobacco: Never Used  . Alcohol use No  . Drug use: No  . Sexual activity: No   Other Topics Concern  . None   Social History Narrative  . None     PHYSICAL EXAMINATION  ECOG PERFORMANCE STATUS: 0 - Asymptomatic  Vitals:   08/04/17 1320  BP: (!) 145/62  Pulse: 100  Resp: 16  SpO2: 94%   GENERAL:alert, no distress, well nourished, well developed, comfortable, cooperative, obese, smiling and unaccompanied SKIN: skin color, texture, turgor are normal, no rashes or significant lesions HEAD: Normocephalic, No masses, lesions, tenderness or abnormalities EYES: normal, EOMI, Conjunctiva are pink and non-injected EARS: External ears normal OROPHARYNX:lips, buccal mucosa, and tongue normal and mucous membranes are moist  NECK: supple, trachea midline LYMPH:  No cervical,  supraclavicular or axillary lymphadenopathy. BREAST:Right breast without nipple discharge, masses, or axillary lymphadenopathy. Left breast martial mastectomy, no masses palpated, no axillary lymphadenopathy. LUNGS: clear to auscultation no wheezing, rales, rhonchi HEART: regular rate & rhythm ABDOMEN:abdomen soft, non-tender and normal bowel sounds BACK: Back symmetric, no curvature. EXTREMITIES:less then 2 second capillary refill, no joint deformities, effusion, or inflammation, no skin discoloration  NEURO: alert & oriented x 3 with fluent speech, no focal motor/sensory deficits, gait normal   LABORATORY DATA: CBC    Component Value Date/Time   WBC 7.0 08/04/2017 1248   RBC 4.82 08/04/2017 1248   HGB 14.3 08/04/2017 1248   HCT 42.9 08/04/2017 1248   PLT 253 08/04/2017 1248   MCV 89.0 08/04/2017 1248   MCH 29.7 08/04/2017 1248   MCHC 33.3 08/04/2017 1248   RDW 14.9 08/04/2017 1248   LYMPHSABS 1.5 08/04/2017 1248   MONOABS 0.6 08/04/2017 1248   EOSABS 0.1 08/04/2017 1248   BASOSABS 0.0 08/04/2017 1248      Chemistry      Component Value Date/Time   NA 136 08/04/2017 1248   K 3.9 08/04/2017 1248   CL 101 08/04/2017 1248   CO2 25 08/04/2017 1248   BUN 24 (H) 08/04/2017 1248   CREATININE 1.16 (H) 08/04/2017 1248      Component Value Date/Time   CALCIUM 10.0 08/04/2017 1248   ALKPHOS 44 08/04/2017 1248   AST 37 08/04/2017 1248   ALT 36 08/04/2017 1248   BILITOT 0.4 08/04/2017 1248         ASSESSMENT AND PLAN:  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 Arnoldo Morale on 06/26/2015, followed by XRT by Dr. Isidore Moos from 09/10/2015- 10/08/2015, and now on Arimidex beginning on 10/16/2015.  06/09/17: left breast biopsy for microcalcification. Surg path: benign.  PLAN: Patient clinically NED on breast exam today. Continue surveillance. Bilateral diagnostic mammogram June  2019. Continue arimidex. Last DEXA scan 08/2016 was normal. Repeat in 08/2018. Continue calcium-vitamin D supplementation.  Reviewed labs with patient today in detail. Mild increase in creatinine, possibly due to all her kidney stone issues at this time, but I have advised her to keep an eye on her creatinine with her PCP.  RTC in 6 months with labs.  THERAPY PLAN:  NCCN guidelines recommends the following surveillance for invasive breast cancer (2.2017):  A. History and  Physical exam 1-4 times per year as clinically appropriate for 5 years, then annually.  B. Periodic screening for changes in family history and referral to genetics counseling as indicated  C. Educate, monitor, and refer to lymphedema management.  D. Mammography every 12 months  E. Routine imaging of reconstructed breast is not indicated.  F. In the absence of clinical signs and symptoms suggestive of recurrent disease, there is no indication for laboratory or imaging studies for metastases screening.  G. Women on Tamoxifen: annual gynecologic assessment every 12 months if uterus is present.  H. Women on aromatase inhibitor or who experience ovarian failure secondary to treatment should have monitoring of bone health with a bone mineral density determination at baseline and periodically thereafter.  I. Assess and encourage adherence to adjuvant endocrine therapy.  J. Evidence suggests that active lifestyle, healthy diet, limited alcohol intake, and achieving and maintaining an ideal body weight (20-25 BMI) may lead to optimal breast cancer outcomes.   All questions were answered. The patient knows to call the clinic with any problems, questions or concerns. We can certainly see the patient much sooner if necessary.    This note is electronically signed by: Twana First, MD 08/04/2017 1:32 PM

## 2017-08-23 DIAGNOSIS — N200 Calculus of kidney: Secondary | ICD-10-CM | POA: Diagnosis not present

## 2017-08-23 DIAGNOSIS — R1032 Left lower quadrant pain: Secondary | ICD-10-CM | POA: Diagnosis not present

## 2017-08-23 DIAGNOSIS — R200 Anesthesia of skin: Secondary | ICD-10-CM | POA: Diagnosis not present

## 2017-09-08 DIAGNOSIS — Z961 Presence of intraocular lens: Secondary | ICD-10-CM | POA: Diagnosis not present

## 2017-09-08 DIAGNOSIS — Z7984 Long term (current) use of oral hypoglycemic drugs: Secondary | ICD-10-CM | POA: Diagnosis not present

## 2017-09-08 DIAGNOSIS — E119 Type 2 diabetes mellitus without complications: Secondary | ICD-10-CM | POA: Diagnosis not present

## 2017-12-31 ENCOUNTER — Other Ambulatory Visit (HOSPITAL_COMMUNITY): Payer: Self-pay

## 2017-12-31 DIAGNOSIS — C50312 Malignant neoplasm of lower-inner quadrant of left female breast: Secondary | ICD-10-CM

## 2017-12-31 DIAGNOSIS — Z170 Estrogen receptor positive status [ER+]: Principal | ICD-10-CM

## 2017-12-31 MED ORDER — ANASTROZOLE 1 MG PO TABS: 1.0000 mg | ORAL_TABLET | Freq: Every day | ORAL | 1 refills | Status: DC

## 2017-12-31 NOTE — Telephone Encounter (Signed)
Patient came by cancer center to request refill on arimidex. Reviewed with provider, chart checked and refilled.

## 2018-01-24 ENCOUNTER — Other Ambulatory Visit (HOSPITAL_COMMUNITY): Payer: Self-pay

## 2018-01-24 DIAGNOSIS — E782 Mixed hyperlipidemia: Secondary | ICD-10-CM | POA: Diagnosis not present

## 2018-01-24 DIAGNOSIS — C50312 Malignant neoplasm of lower-inner quadrant of left female breast: Secondary | ICD-10-CM

## 2018-01-24 DIAGNOSIS — E1122 Type 2 diabetes mellitus with diabetic chronic kidney disease: Secondary | ICD-10-CM | POA: Diagnosis not present

## 2018-01-24 DIAGNOSIS — Z0000 Encounter for general adult medical examination without abnormal findings: Secondary | ICD-10-CM | POA: Diagnosis not present

## 2018-01-24 DIAGNOSIS — E119 Type 2 diabetes mellitus without complications: Secondary | ICD-10-CM | POA: Diagnosis not present

## 2018-01-24 DIAGNOSIS — I129 Hypertensive chronic kidney disease with stage 1 through stage 4 chronic kidney disease, or unspecified chronic kidney disease: Secondary | ICD-10-CM | POA: Diagnosis not present

## 2018-01-24 DIAGNOSIS — N183 Chronic kidney disease, stage 3 (moderate): Secondary | ICD-10-CM | POA: Diagnosis not present

## 2018-01-24 DIAGNOSIS — Z170 Estrogen receptor positive status [ER+]: Principal | ICD-10-CM

## 2018-01-25 ENCOUNTER — Inpatient Hospital Stay (HOSPITAL_COMMUNITY): Payer: PPO | Attending: Internal Medicine

## 2018-01-25 DIAGNOSIS — Z79899 Other long term (current) drug therapy: Secondary | ICD-10-CM | POA: Diagnosis not present

## 2018-01-25 DIAGNOSIS — E119 Type 2 diabetes mellitus without complications: Secondary | ICD-10-CM | POA: Insufficient documentation

## 2018-01-25 DIAGNOSIS — Z87891 Personal history of nicotine dependence: Secondary | ICD-10-CM | POA: Insufficient documentation

## 2018-01-25 DIAGNOSIS — Z79811 Long term (current) use of aromatase inhibitors: Secondary | ICD-10-CM | POA: Insufficient documentation

## 2018-01-25 DIAGNOSIS — Z923 Personal history of irradiation: Secondary | ICD-10-CM | POA: Insufficient documentation

## 2018-01-25 DIAGNOSIS — Z170 Estrogen receptor positive status [ER+]: Secondary | ICD-10-CM | POA: Diagnosis not present

## 2018-01-25 DIAGNOSIS — C50312 Malignant neoplasm of lower-inner quadrant of left female breast: Secondary | ICD-10-CM | POA: Diagnosis not present

## 2018-01-25 DIAGNOSIS — K219 Gastro-esophageal reflux disease without esophagitis: Secondary | ICD-10-CM | POA: Diagnosis not present

## 2018-01-25 DIAGNOSIS — E785 Hyperlipidemia, unspecified: Secondary | ICD-10-CM | POA: Insufficient documentation

## 2018-01-25 DIAGNOSIS — I10 Essential (primary) hypertension: Secondary | ICD-10-CM | POA: Insufficient documentation

## 2018-01-25 DIAGNOSIS — M1990 Unspecified osteoarthritis, unspecified site: Secondary | ICD-10-CM | POA: Diagnosis not present

## 2018-01-25 LAB — COMPREHENSIVE METABOLIC PANEL
ALBUMIN: 4.2 g/dL (ref 3.5–5.0)
ALT: 25 U/L (ref 14–54)
ANION GAP: 14 (ref 5–15)
AST: 29 U/L (ref 15–41)
Alkaline Phosphatase: 55 U/L (ref 38–126)
BUN: 30 mg/dL — ABNORMAL HIGH (ref 6–20)
CO2: 23 mmol/L (ref 22–32)
Calcium: 10.4 mg/dL — ABNORMAL HIGH (ref 8.9–10.3)
Chloride: 101 mmol/L (ref 101–111)
Creatinine, Ser: 1.13 mg/dL — ABNORMAL HIGH (ref 0.44–1.00)
GFR calc Af Amer: 55 mL/min — ABNORMAL LOW (ref >60)
GFR calc non Af Amer: 47 mL/min — ABNORMAL LOW (ref >60)
GLUCOSE: 108 mg/dL — AB (ref 65–99)
POTASSIUM: 4 mmol/L (ref 3.5–5.1)
SODIUM: 138 mmol/L (ref 135–145)
Total Bilirubin: 0.7 mg/dL (ref 0.3–1.2)
Total Protein: 7.3 g/dL (ref 6.5–8.1)

## 2018-01-25 LAB — CBC WITH DIFFERENTIAL/PLATELET
BASOS PCT: 0 %
Basophils Absolute: 0 10*3/uL (ref 0.0–0.1)
EOS ABS: 0.1 10*3/uL (ref 0.0–0.7)
Eosinophils Relative: 1 %
HCT: 42.7 % (ref 36.0–46.0)
Hemoglobin: 13.4 g/dL (ref 12.0–15.0)
Lymphocytes Relative: 23 %
Lymphs Abs: 1.3 10*3/uL (ref 0.7–4.0)
MCH: 28.2 pg (ref 26.0–34.0)
MCHC: 31.4 g/dL (ref 30.0–36.0)
MCV: 89.9 fL (ref 78.0–100.0)
MONO ABS: 0.6 10*3/uL (ref 0.1–1.0)
MONOS PCT: 10 %
NEUTROS PCT: 66 %
Neutro Abs: 3.8 10*3/uL (ref 1.7–7.7)
PLATELETS: 240 10*3/uL (ref 150–400)
RBC: 4.75 MIL/uL (ref 3.87–5.11)
RDW: 14.5 % (ref 11.5–15.5)
WBC: 5.7 10*3/uL (ref 4.0–10.5)

## 2018-02-01 ENCOUNTER — Other Ambulatory Visit: Payer: Self-pay

## 2018-02-01 ENCOUNTER — Inpatient Hospital Stay (HOSPITAL_BASED_OUTPATIENT_CLINIC_OR_DEPARTMENT_OTHER): Payer: PPO | Admitting: Internal Medicine

## 2018-02-01 ENCOUNTER — Encounter (HOSPITAL_COMMUNITY): Payer: Self-pay | Admitting: Internal Medicine

## 2018-02-01 VITALS — BP 151/60 | HR 85 | Temp 97.9°F | Resp 20 | Wt 171.8 lb

## 2018-02-01 DIAGNOSIS — Z170 Estrogen receptor positive status [ER+]: Secondary | ICD-10-CM

## 2018-02-01 DIAGNOSIS — E119 Type 2 diabetes mellitus without complications: Secondary | ICD-10-CM

## 2018-02-01 DIAGNOSIS — E785 Hyperlipidemia, unspecified: Secondary | ICD-10-CM

## 2018-02-01 DIAGNOSIS — I10 Essential (primary) hypertension: Secondary | ICD-10-CM

## 2018-02-01 DIAGNOSIS — Z79811 Long term (current) use of aromatase inhibitors: Secondary | ICD-10-CM | POA: Diagnosis not present

## 2018-02-01 DIAGNOSIS — C50312 Malignant neoplasm of lower-inner quadrant of left female breast: Secondary | ICD-10-CM

## 2018-02-01 DIAGNOSIS — Z923 Personal history of irradiation: Secondary | ICD-10-CM

## 2018-02-01 DIAGNOSIS — Z87891 Personal history of nicotine dependence: Secondary | ICD-10-CM | POA: Diagnosis not present

## 2018-02-01 DIAGNOSIS — Z79899 Other long term (current) drug therapy: Secondary | ICD-10-CM | POA: Diagnosis not present

## 2018-02-01 DIAGNOSIS — M1990 Unspecified osteoarthritis, unspecified site: Secondary | ICD-10-CM

## 2018-02-01 DIAGNOSIS — K219 Gastro-esophageal reflux disease without esophagitis: Secondary | ICD-10-CM | POA: Diagnosis not present

## 2018-02-01 NOTE — Patient Instructions (Addendum)
[place] at [facility] Discharge Instructions  Today you saw Dr. Mathis Dad Higgs Be sure to get your mammogram in June and we will have you follow up a few days after to review results. Your Bone density is due in October Let us know before you get near the end of your supply of Arimidex so that we can get it filled in an appropriate amount of time for you.    Your lab work was reviewed with you today during your visit.  Everything was within normal range.   Thank you for choosing [place] at [facility] to provide your oncology and hematology care.  To afford each patient quality time with our provider, please arrive at least 15 minutes before your scheduled appointment time.   If you have a lab appointment with the [facility] please come in thru the  Main Entrance and check in at the main information desk  You need to re-schedule your appointment should you arrive 10 or more minutes late.  We strive to give you quality time with our providers, and arriving late affects you and other patients whose appointments are after yours.  Also, if you no show three or more times for appointments you may be dismissed from the clinic at the providers discretion.     Again, thank you for choosing [facility].  Our hope is that these requests will decrease the amount of time that you wait before being seen by our physicians.       _____________________________________________________________  Should you have questions after your visit to [facility], please contact our office at [phone] between the hours of 8:30 a.m. and 4:30 p.m.  Voicemails left after 4:30 p.m. will not be returned until the following business day.  For prescription refill requests, have your pharmacy contact our office.       Resources For Cancer Patients and their Caregivers ? American Cancer Society: Can assist with transportation, wigs, general  needs, runs Look Good Feel Better.        539-348-3909 ? Cancer Care: Provides financial assistance, online support groups, medication/co-pay assistance.  1-800-813-HOPE 3607245086) ? [facility] Assists [place] cancer patients and their families through emotional , educational and financial support.  3673919318 ? [place] DSS Where to apply for food stamps, Medicaid and utility assistance. 682 147 1909 ? [facility]: Transportation to medical appointments. 650-403-1619 ? Social Security Administration: May apply for disability if have a Stage IV cancer. 424-598-8203 416 773 3141 ? [employer], Disability and Transit Services: Assists with nutrition, care and transit needs. [place] Support Programs:   > Cancer Support Group  2nd Tuesday of the month 1pm-2pm, Journey Room   > Creative Journey  3rd Tuesday of the month 1130am-1pm, Journey Room

## 2018-03-21 DIAGNOSIS — N183 Chronic kidney disease, stage 3 (moderate): Secondary | ICD-10-CM | POA: Diagnosis not present

## 2018-03-21 DIAGNOSIS — I129 Hypertensive chronic kidney disease with stage 1 through stage 4 chronic kidney disease, or unspecified chronic kidney disease: Secondary | ICD-10-CM | POA: Diagnosis not present

## 2018-03-22 NOTE — Progress Notes (Signed)
Diagnosis Malignant neoplasm of lower-inner quadrant of left breast in female, estrogen receptor positive ([place]) - Plan: DG Bone Density, [facility] Digital Diagnostic Bilat  Staging Cancer Staging 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 [facility]) Staging form: Breast, AJCC 7th Edition - Clinical stage from 11/19/2015: Stage IA (T1c, N0, M0) - Signed by Baird Cancer, PA-C on 11/19/2015   Assessment and Plan:  1.  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  Pt remains on Arimidex that began 09/2015.  She is planned for 5-10 years of therapy.  Last bilateral diagnostic mammogram was done 04/2017 and was negative other than lumpectomy changes in the left breast.  She will be set up for bilateral diagnostic mammogram and will RTC in 04/2018 for follow-up to go over results.   Continue Arimidex.    2.  HTN.  BP is 151/60.  Follow-up with PCP.   3.  DM.  Follow-up with PCP.    4.  Health maintenance.  Last BMD was 08/2016 and was normal.  She is set up for BMD evaluation in 08/2018.  Continue GI evaluation as recommended.    Interval history:  72 y.o. 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 Arnoldo Morale on 06/26/2015, followed by XRT by Dr. Isidore Moos from 09/10/2015- 10/08/2015, and now on Arimidex beginning on 10/16/2015.  Current Status:  Pt is seen today for follow-up.  Last mammogram was done 04/2017.       Breast cancer ([place]) (Resolved)   08/16/2015 Initial Diagnosis    Breast cancer ([place])      09/11/2015 -  Radiation Therapy    Dr. 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 ER+ 100%, PR+ 80%      05/02/2015 Procedure    Left breast stereotactic core needle biopsy      06/26/2015 Oncotype testing    ONCOTYPE DX assay with recurrence score result of 5, Low risk, 10 year risk of distance recurrence tamoxifen alone 5%      06/26/2015 Definitive Surgery    Breast partial mastectomy after needle localization with Dr. Arnoldo Morale no sentinel node performed      06/26/2015 Pathology Results    INVASIVE DUCTAL CARCINOMA, 2 CM. - HIGH GRADE DUCTAL CARCINOMA IN SITU. - INVASIVE CARCINOMA FOCALLY LESS THAN 0.1 CM FROM LATERAL MARGIN. - DUCTAL CARCINOMA IN SITU FOCALLY LESS THAN 0.1 CM FROM THE LATERAL MARGIN.      06/26/2015 Pathology Results    ER+ 100%, PR + 60%, Ki-67 25%, HER2 NEGATIVE      07/31/2015 Procedure    Sentinel node procedure      07/31/2015 Pathology Results    ONE BENIGN LYMPH NODE (0/1).      08/26/2015 Imaging    Bone density- Normal      09/10/2015 - 10/08/2015 Radiation Therapy    50 Gy left breast by Dr. Isidore Moos.      10/16/2015 -  Anti-estrogen oral therapy    Arimidex  05/08/2016 Procedure    Stereotactic-guided biopsies of the calcifications of left breast.      05/08/2016 Pathology Results    Breast, left, needle core biopsy, lateral and ant to lumpectomy site FIBROCYSTIC CHANGES VASCULAR CALCIFICATIONS NO EVIDENCE OF MALIGNANCY      05/08/2016 Pathology Results    2. Breast, left, needle core biopsy, lumpectomy site FAT NECROSIS WITH FIBROSIS AND MICROCALCIFICATIONS NO EVIDENCE OF MALIGNANCY      06/09/2016 Surgery    Left breast biopsy for microcalcifications. Path: benign.        Problem List Patient Active Problem List   Diagnosis Date Noted  . Special screening for malignant neoplasms, colon [Z12.11]   . Actinic keratosis [L57.0] 01/12/2016  . Benign essential hypertension [I10]  01/12/2016  . Gastroesophageal reflux disease without esophagitis [K21.9] 01/12/2016  . Mixed hyperlipidemia [E78.2] 01/12/2016  . Osteopenia [M85.80] 01/12/2016  . Seborrheic keratoses [L82.1] 01/12/2016  . 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 ([place]) [C50.312] 10/16/2015  . Nonspecific abnormal finding in stool contents [R19.5] 01/11/2012  . Diabetes mellitus without mention of complication [984.2] 09/15/2810    Past Medical History Past Medical History:  Diagnosis Date  . Anemia    hx of   . Arthritis   . Breast cancer ([place]) 06/26/15   left  . Diabetes mellitus    type II   . Disorder of bone and cartilage, unspecified   . Essential hypertension, benign   . GERD (gastroesophageal reflux disease)   . Hepatitis    Hep A as a child  . History of kidney stones   . Nonspecific abnormal finding in stool contents 01/11/2012  . Other and unspecified hyperlipidemia     Past Surgical History Past Surgical History:  Procedure Laterality Date  . ABDOMINAL HYSTERECTOMY    . AXILLARY SENTINEL NODE BIOPSY Left 07/31/2015   Procedure: SENTINEL LYMPH NODE BIOPSY, LEFT AXILLA, ;  Surgeon: Aviva Signs, MD;  Location: [facility];  Service: General;  Laterality: Left;  Sentinel Node @ 1000  . BREAST BIOPSY Left 06/09/2016   Procedure: LEFT BREAST BIOPSY AFTER NEEDLE LOCALIZATION;  Surgeon: Aviva Signs, MD;  Location: [facility];  Service: General;  Laterality: Left;  . CARPAL TUNNEL RELEASE Left 11/02/2014   Procedure: CARPAL TUNNEL RELEASE;  Surgeon: Charlotte Crumb, MD;  Location: [facility];  Service: Orthopedics;  Laterality: Left;  . CATARACT EXTRACTION Bilateral 2007  . COLONOSCOPY N/A 02/03/2017   Procedure: COLONOSCOPY;  Surgeon: Danie Binder, MD;  Location[facility];  Service: Endoscopy;  Laterality: N/A;  1245  . COLONOSCOPY W/ BIOPSIES AND POLYPECTOMY    . CYSTOSCOPY/URETEROSCOPY/HOLMIUM LASER/STENT PLACEMENT Bilateral 03/18/2017   Procedure: CYSTOSCOPY/ BILATERAL  RETROGRADE/RIGHT URETEROSCOPY/ RIGHT HOLMIUM LASER APPLICATION/RIGHT URETERAL STENT PLACEMENT;  Surgeon: Irine Seal, MD;  Location: [facility];  Service: Urology;  Laterality: Bilateral;  . FOOT SURGERY Left 2008   fractured foot  . LITHOTRIPSY    . OPEN REDUCTION INTERNAL FIXATION (ORIF) DISTAL RADIAL FRACTURE Left 11/02/2014   Procedure: OPEN REDUCTION INTERNAL FIXATION (ORIF) DISTAL RADIAL FRACTURE;  Surgeon: Charlotte Crumb, MD;  Location: [facility];  Service: Orthopedics;  Laterality: Left;  . PARTIAL MASTECTOMY WITH NEEDLE LOCALIZATION Left 06/26/2015   Procedure: PARTIAL MASTECTOMY WITH NEEDLE LOCALIZATION;  Surgeon: Aviva Signs, MD;  Location: [facility];  Service: General;  Laterality: Left;  Needle Loc @ 8:00am  . POLYPECTOMY  02/03/2017   Procedure: POLYPECTOMY;  Surgeon: Danie Binder, MD;  [facility];  Service: Endoscopy;;  descending and hepatic flexure    Family History Family History  Problem Relation Age of Onset  . Hypertension Mother   . Osteoporosis Mother   . Diabetes Sister        x3  . Colon cancer Neg Hx      Social History  reports that she quit smoking about 31 years ago. Her smoking use included cigarettes. She has a 20.00 pack-year smoking history. She has never used smokeless tobacco. She reports that she does not drink alcohol or use drugs.  Medications  Current Outpatient Medications:  .  acetaminophen (TYLENOL 8 HOUR ARTHRITIS PAIN) 650 MG CR tablet, Take 650 mg by mouth every 8 (eight) hours as needed for pain., Disp: , Rfl:  .  anastrozole (ARIMIDEX) 1 MG tablet, Take 1 tablet (1 mg total) by mouth daily., Disp: 90 tablet, Rfl: 1 .  aspirin EC 81 MG tablet, Take 81 mg by mouth every other day., Disp: , Rfl:  .  Cholecalciferol (VITAMIN D3) 1000 units CAPS, Take 1,000 Units by mouth at bedtime., Disp: , Rfl:  .  fenofibrate 160 MG tablet, Take 160 mg by mouth daily., Disp: , Rfl:  .  losartan-hydrochlorothiazide (HYZAAR) 50-12.5 MG per tablet, Take  1 tablet by mouth daily., Disp: , Rfl:  .  metFORMIN (GLUCOPHAGE) 1000 MG tablet, Take 1,000 mg by mouth 2 (two) times daily with a meal., Disp: , Rfl:  .  Multiple Vitamin (MULTIVITAMIN WITH MINERALS) TABS tablet, Take 1 tablet by mouth daily. Women's One A Day 50+, Disp: , Rfl:  .  Omega-3 Fatty Acids (FISH OIL) 1200 MG CAPS, Take 1,200 mg by mouth 2 (two) times daily., Disp: , Rfl:  .  omeprazole (PRILOSEC) 20 MG capsule, Take 20 mg by mouth daily., Disp: , Rfl:  .  simvastatin (ZOCOR) 40 MG tablet, Take 40 mg by mouth at bedtime. , Disp: , Rfl:   Allergies Patient has no known allergies.  Review of Systems Review of Systems - Oncology ROS as per HPI otherwise 12 point ROS is negative.   Physical Exam  Vitals Wt Readings from Last 3 Encounters:  02/01/18 171 lb 12.8 oz (77.9 kg)  08/04/17 181 lb (82.1 kg)  03/18/17 180 lb (81.6 kg)   Temp Readings from Last 3 Encounters:  02/01/18 97.9 F (36.6 C) (Oral)  03/18/17 97.7 F (36.5 C)  03/11/17 97.9 F (36.6 C) (Oral)   BP Readings from Last 3 Encounters:  02/01/18 (!) 151/60  08/04/17 (!) 145/62  03/18/17 (!) 145/78   Pulse Readings from Last 3 Encounters:  02/01/18 85  08/04/17 100  03/18/17 84    Constitutional: Well-developed, well-nourished, and in no distress.   HENT: Head: Normocephalic and atraumatic.  Mouth/Throat: No oropharyngeal exudate. Mucosa moist. Eyes: Pupils are equal, round, and reactive to light. Conjunctivae are normal. No scleral icterus.  Neck: Normal range of motion. Neck supple. No JVD present.  Cardiovascular: Normal rate, regular rhythm and normal heart sounds.  Exam reveals no gallop and no friction rub.   No murmur heard. Pulmonary/Chest: Effort normal and breath sounds normal. No respiratory distress. No wheezes.No rales.  Abdominal: Soft. Bowel sounds are normal. No distension. There is no tenderness. There is no guarding.  Musculoskeletal: No edema or tenderness.  Lymphadenopathy: No  cervical, axillary or supraclavicular adenopathy.  Neurological: Alert and oriented to person, place, and time. No cranial nerve deficit.  Skin: Skin is warm and dry. No rash noted. No erythema. No pallor.  Psychiatric: Affect  and judgment normal.  Bilateral breast exam:  Chaperone present.  Left lumpectomy site noted.  No dominant masses palpable bilaterally.    Labs No visits with results within 3 Day(s) from this visit.  Latest known visit with results is:  Appointment on 01/25/2018  Component Date Value Ref Range Status  . WBC 01/25/2018 5.7  4.0 - 10.5 K/uL Final  . RBC 01/25/2018 4.75  3.87 - 5.11 MIL/uL Final  . Hemoglobin 01/25/2018 13.4  12.0 - 15.0 g/dL Final  . HCT 01/25/2018 42.7  36.0 - 46.0 % Final  . MCV 01/25/2018 89.9  78.0 - 100.0 fL Final  . MCH 01/25/2018 28.2  26.0 - 34.0 pg Final  . MCHC 01/25/2018 31.4  30.0 - 36.0 g/dL Final  . RDW 01/25/2018 14.5  11.5 - 15.5 % Final  . Platelets 01/25/2018 240  150 - 400 K/uL Final  . Neutrophils Relative % 01/25/2018 66  % Final  . Neutro Abs 01/25/2018 3.8  1.7 - 7.7 K/uL Final  . Lymphocytes Relative 01/25/2018 23  % Final  . Lymphs Abs 01/25/2018 1.3  0.7 - 4.0 K/uL Final  . Monocytes Relative 01/25/2018 10  % Final  . Monocytes Absolute 01/25/2018 0.6  0.1 - 1.0 K/uL Final  . Eosinophils Relative 01/25/2018 1  % Final  . Eosinophils Absolute 01/25/2018 0.1  0.0 - 0.7 K/uL Final  . Basophils Relative 01/25/2018 0  % Final  . Basophils Absolute 01/25/2018 0.0  0.0 - 0.1 K/uL Final   Performed at [facility], [address], [place], [place] [address]  . Sodium 01/25/2018 138  135 - 145 mmol/L Final  . Potassium 01/25/2018 4.0  3.5 - 5.1 mmol/L Final  . Chloride 01/25/2018 101  101 - 111 mmol/L Final  . CO2 01/25/2018 23  22 - 32 mmol/L Final  . Glucose, Bld 01/25/2018 108* 65 - 99 mg/dL Final  . BUN 01/25/2018 30* 6 - 20 mg/dL Final  . Creatinine, Ser 01/25/2018 1.13* 0.44 - 1.00 mg/dL Final  . Calcium 01/25/2018  10.4* 8.9 - 10.3 mg/dL Final  . Total Protein 01/25/2018 7.3  6.5 - 8.1 g/dL Final  . Albumin 01/25/2018 4.2  3.5 - 5.0 g/dL Final  . AST 01/25/2018 29  15 - 41 U/L Final  . ALT 01/25/2018 25  14 - 54 U/L Final  . Alkaline Phosphatase 01/25/2018 55  38 - 126 U/L Final  . Total Bilirubin 01/25/2018 0.7  0.3 - 1.2 mg/dL Final  . GFR calc non Af Amer 01/25/2018 47* >60 mL/min Final  . GFR calc Af Amer 01/25/2018 55* >60 mL/min Final   Comment: (NOTE) The eGFR has been calculated using the CKD EPI equation. This calculation has not been validated in all clinical situations. eGFR's persistently <60 mL/min signify possible Chronic Kidney Disease.   Georgiann Hahn gap 01/25/2018 14  5 - 15 Final   Performed at [facility], [address] [place],  [address]     Pathology Orders Placed This Encounter  Procedures  . DG Bone Density    Standing Status:   Future    Standing Expiration Date:   02/01/2019    Order Specific Question:   Reason for Exam (SYMPTOM  OR DIAGNOSIS REQUIRED)    Answer:   breast cancer    Order Specific Question:   Preferred imaging location?    Answer:   [place] [facility]    Standing Status:   Future  Standing Expiration Date:   02/01/2019    Order Specific Question:   Reason for Exam (SYMPTOM  OR DIAGNOSIS REQUIRED)    Answer:   left breast cancer    Order Specific Question:   Preferred imaging location?    Answer:   [facility]       Zoila Shutter MD

## 2018-04-27 IMAGING — MG DIGITAL DIAGNOSTIC UNILATERAL LEFT MAMMOGRAM
3 series · 3 of 3 positions shown · non-contrast
Comparison: Previous exam(s).

CLINICAL DATA: Status post stereotactic core biopsies of
calcifications in the left breast

EXAM:
DIAGNOSTIC LEFT MAMMOGRAM POST STEREOTACTIC BIOPSY

[L CC]
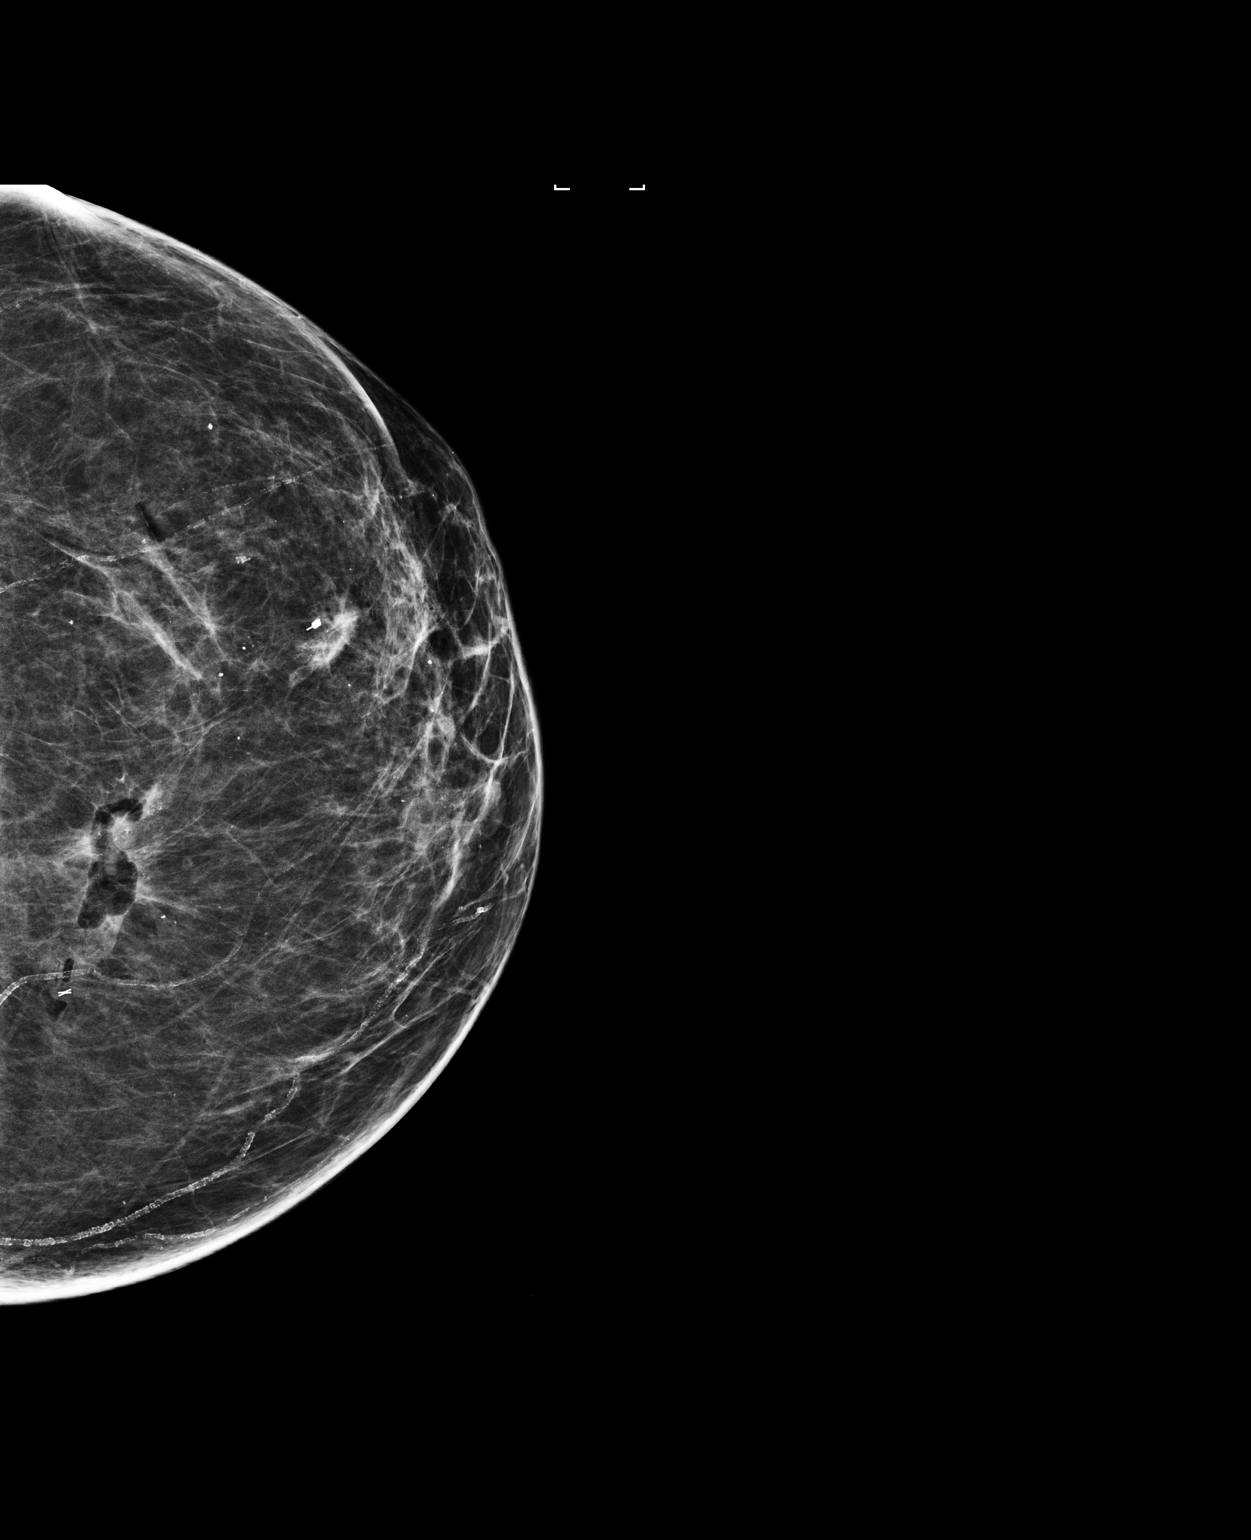

[L LM]
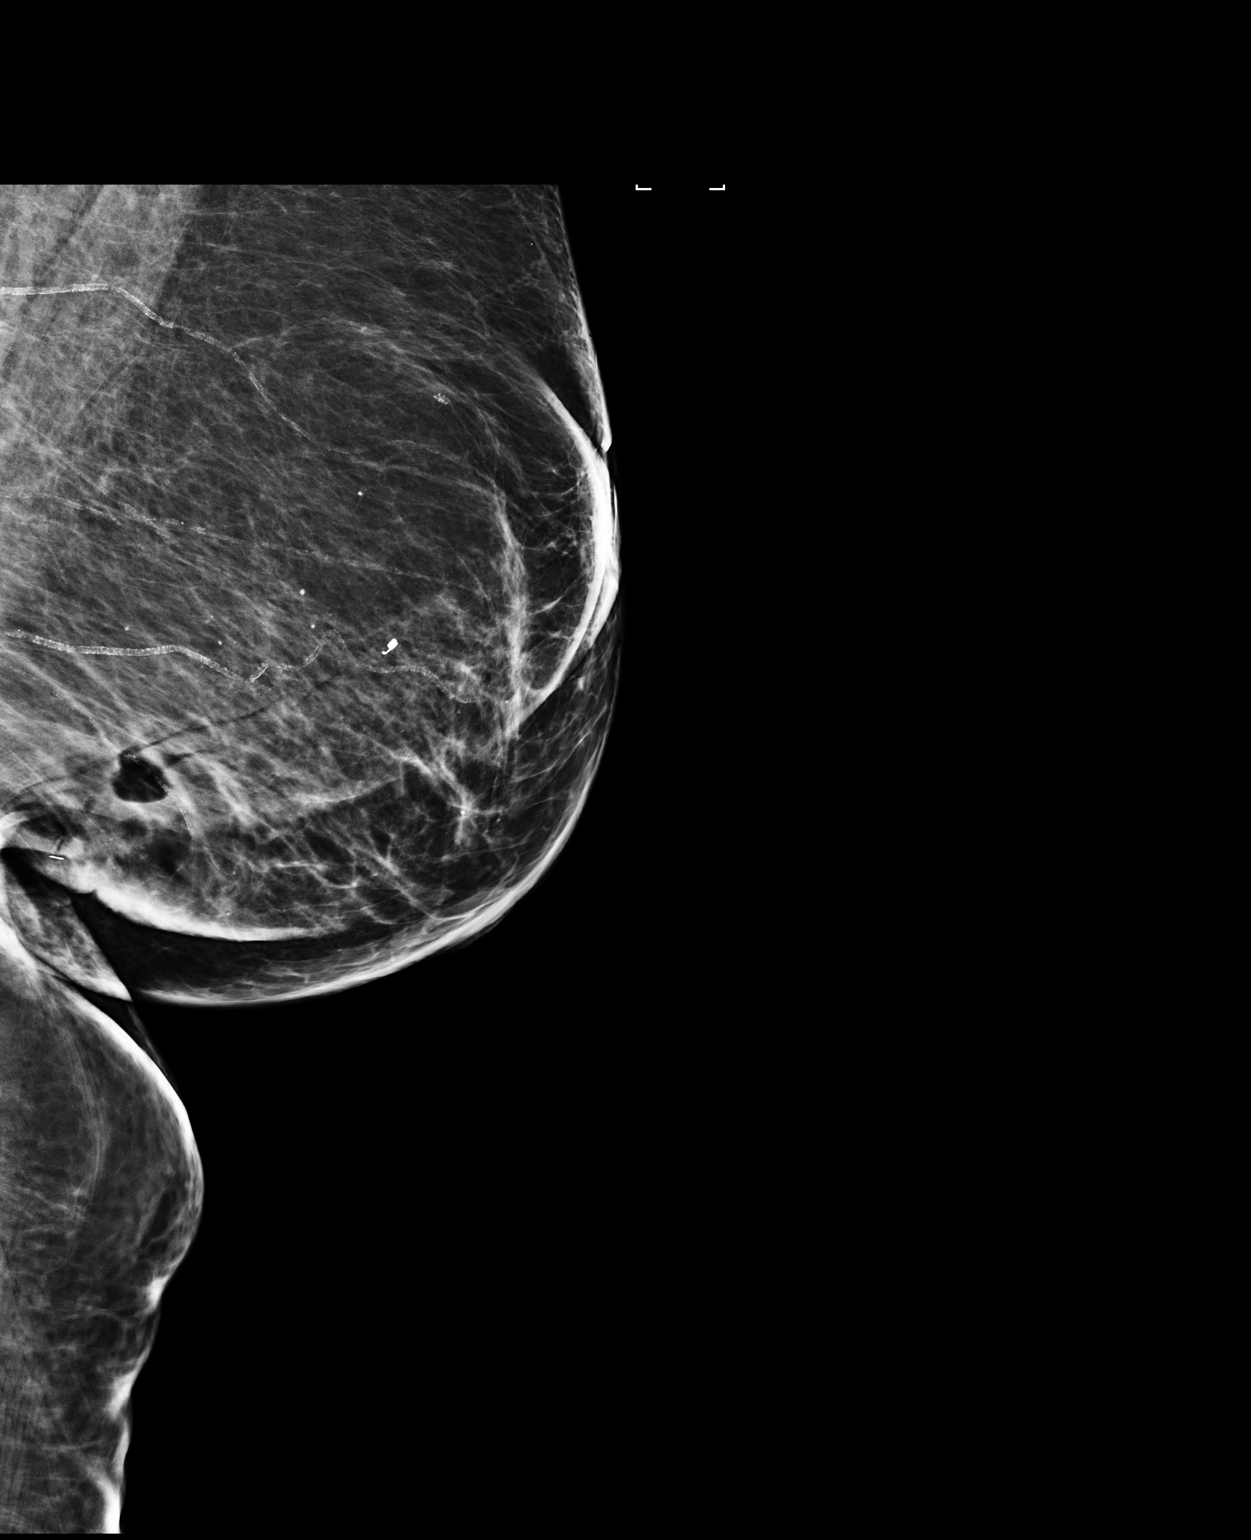

[L ML]
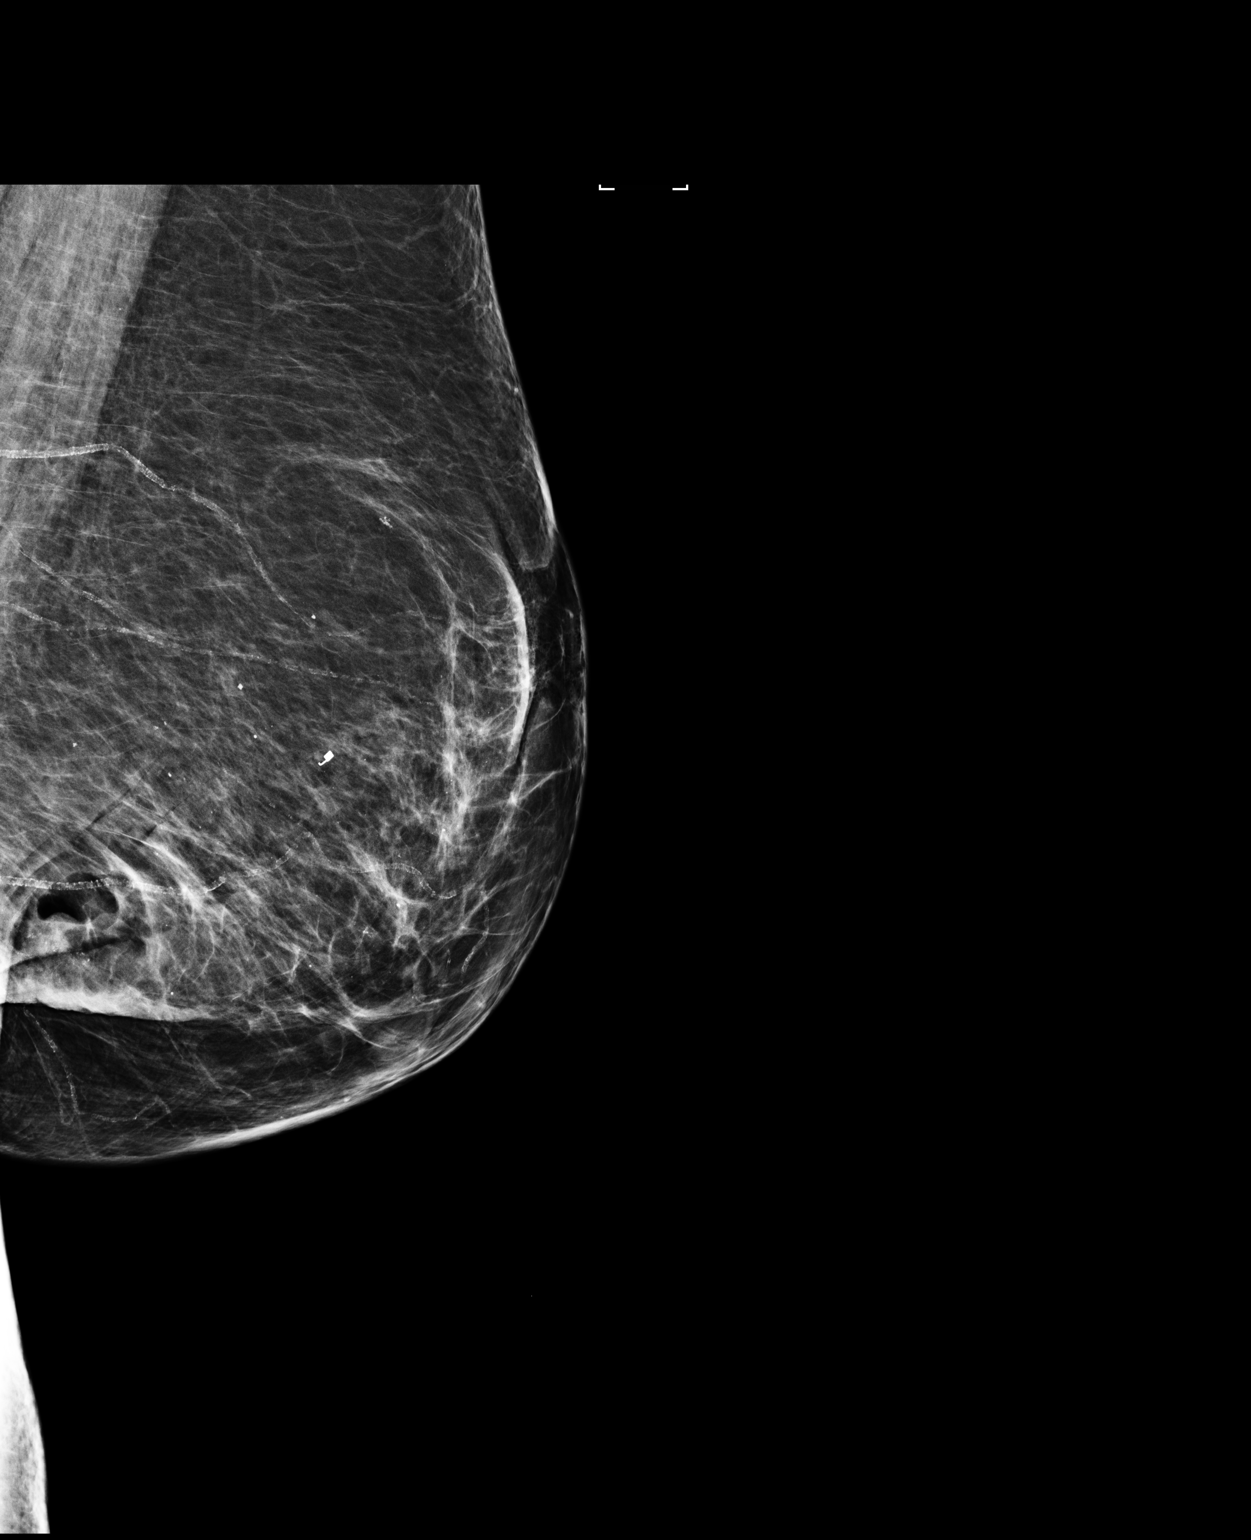

[3 of 3 positions shown; findings below may reference images not displayed]

FINDINGS: Mammographic images were obtained following stereotactic guided
biopsy of calcifications lateral and anterior to the lumpectomy site
and calcifications at the lumpectomy site. Cc and lateral views of
the left breast demonstrate coil biopsy clip in the area of concern
of the calcifications lateral and anterior to the lumpectomy site.
There is a X shaped clip at the area of concern of prior left
lumpectomy site.
IMPRESSION: Post biopsy mammogram demonstrating biopsy clips in areas of
concern.

Final Assessment: Post Procedure Mammograms for Marker Placement

## 2018-04-27 IMAGING — MG STEREOTACTIC CORE NEEDLE BIOPSY
8 of 11 series · 8 of 19 positions shown · non-contrast
Comparison: Previous exams.

ADDENDUM:
Pathology revealed FIBROCYSTIC CHANGES, VASCULAR CALCIFICATIONS of
the Left breast, lateral and anterior to lumpectomy site, with
excision recommended (coil clip). This was found to be discordant by
Dr. Davis Jim. Pathology revealed FAT NECROSIS WITH FIBROSIS AND
MICROCALCIFICATIONS of the Left breast lumpectomy site (x clip).
This was found to be concordant by Dr. Davis Jim. Pathology
results were discussed with the patient by telephone. The patient
reported doing well after the biopsies with tenderness at the sites.
Post biopsy instructions and care were reviewed and questions were
answered. The patient was encouraged to call The [REDACTED] of
consultation has been arranged with Dr. Rudorkristina Maribela at [REDACTED] [REDACTED] in [HOSPITAL][HOSPITAL] on May 21, 2016.

Pathology results reported by Sorin, RN on 05/08/2016.
CLINICAL DATA: Abnormal calcifications in the left breast for
biopsy
EXAM:
LEFT BREAST STEREOTACTIC CORE NEEDLE BIOPSY

[L SPECIMEN]
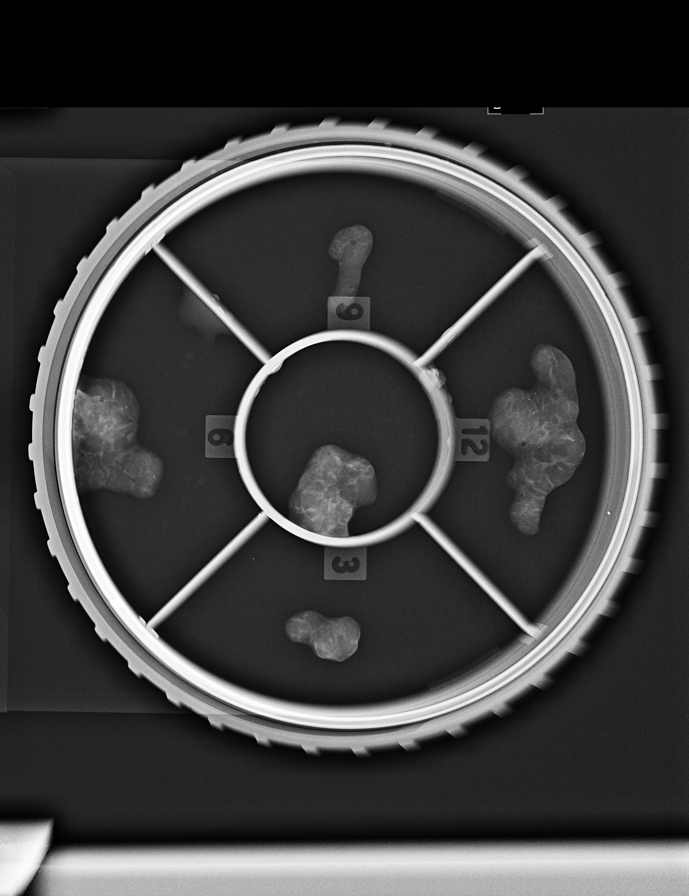

[L CC (1 of 7)]
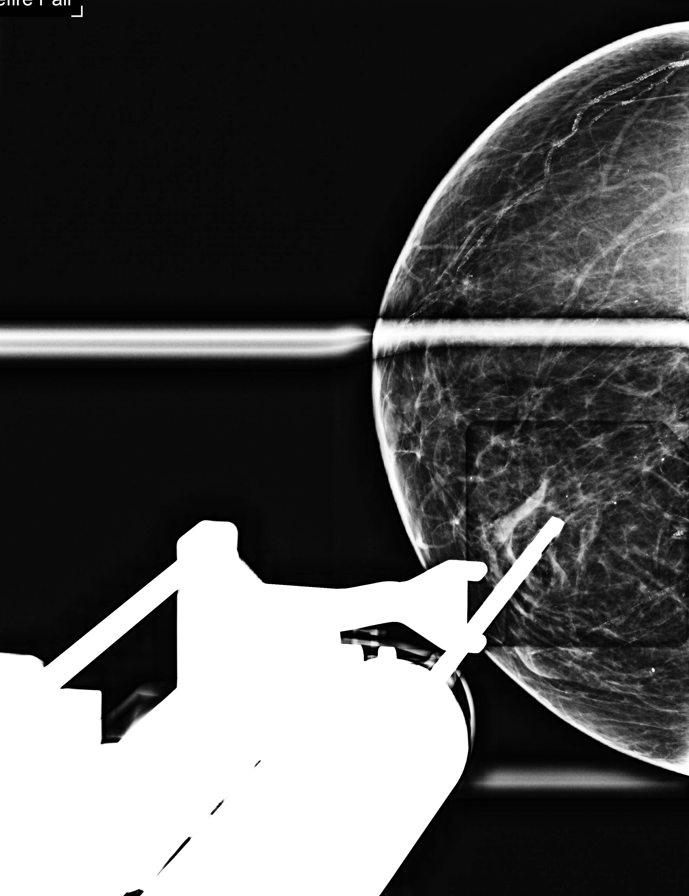

[L CC (2 of 7)]
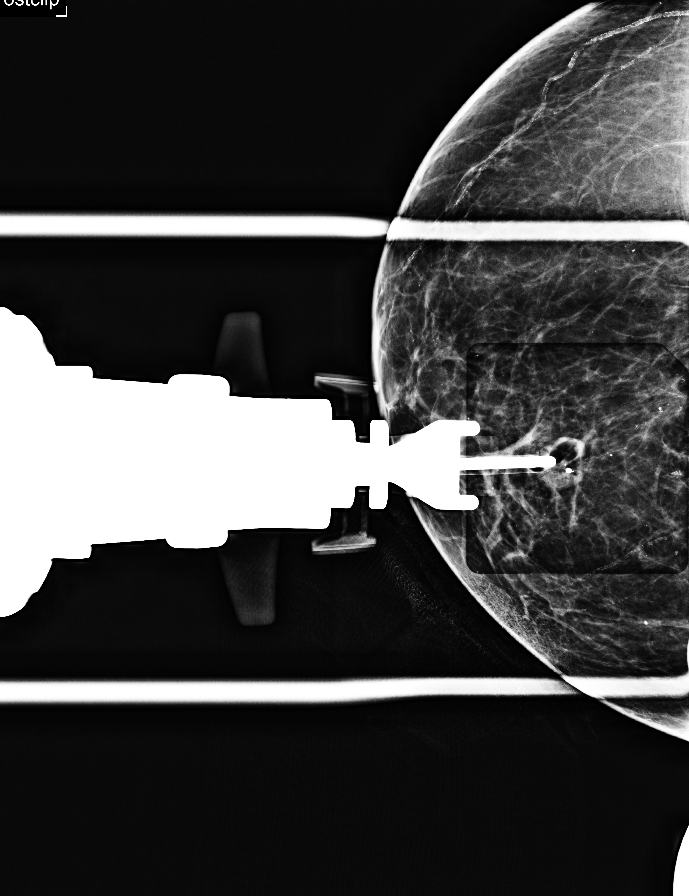

[L CC (3 of 7)]
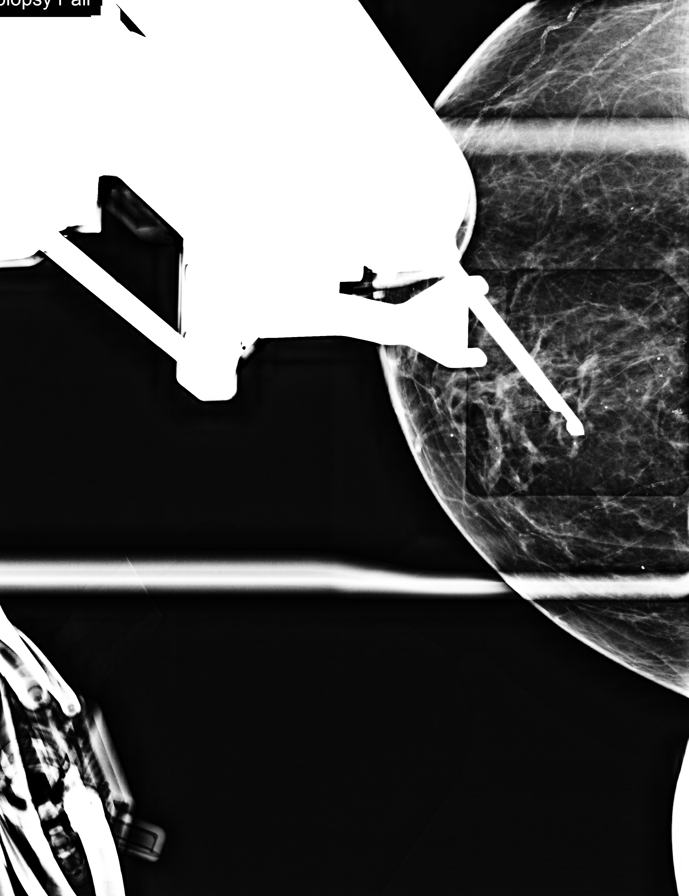

[L CC (4 of 7)]
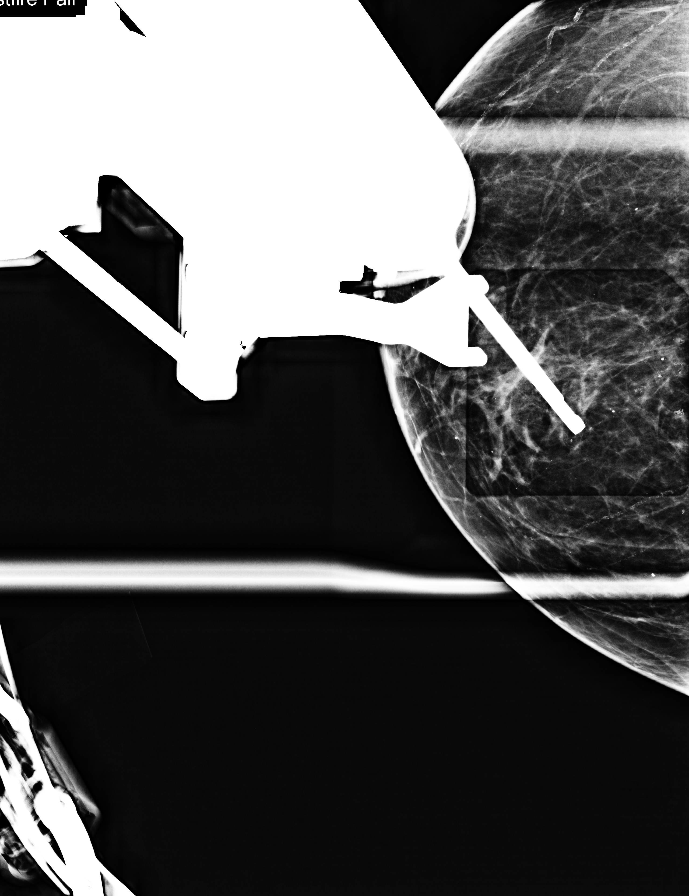

[L CC (5 of 7)]
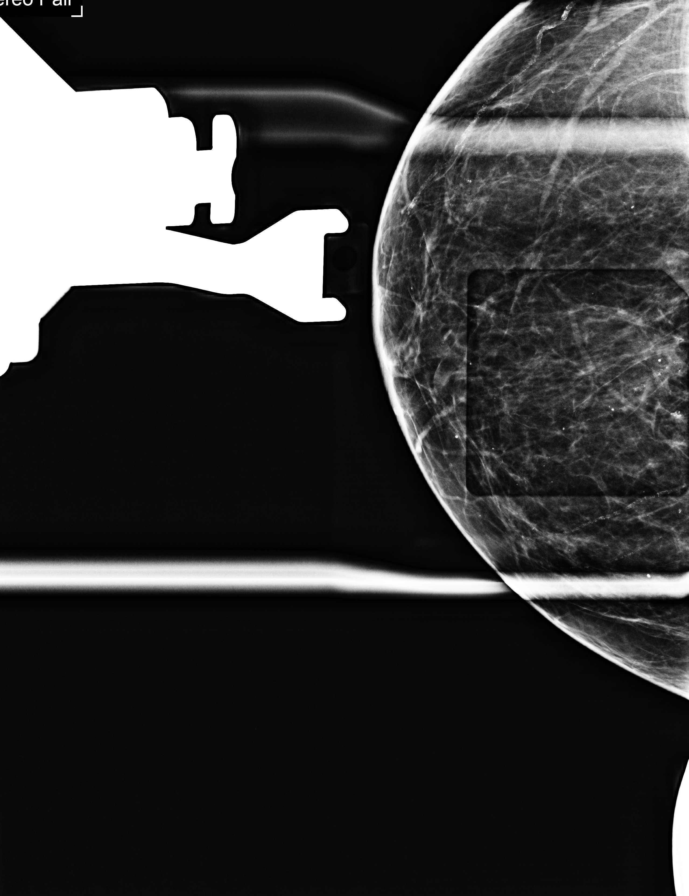

[L CC (6 of 7)]
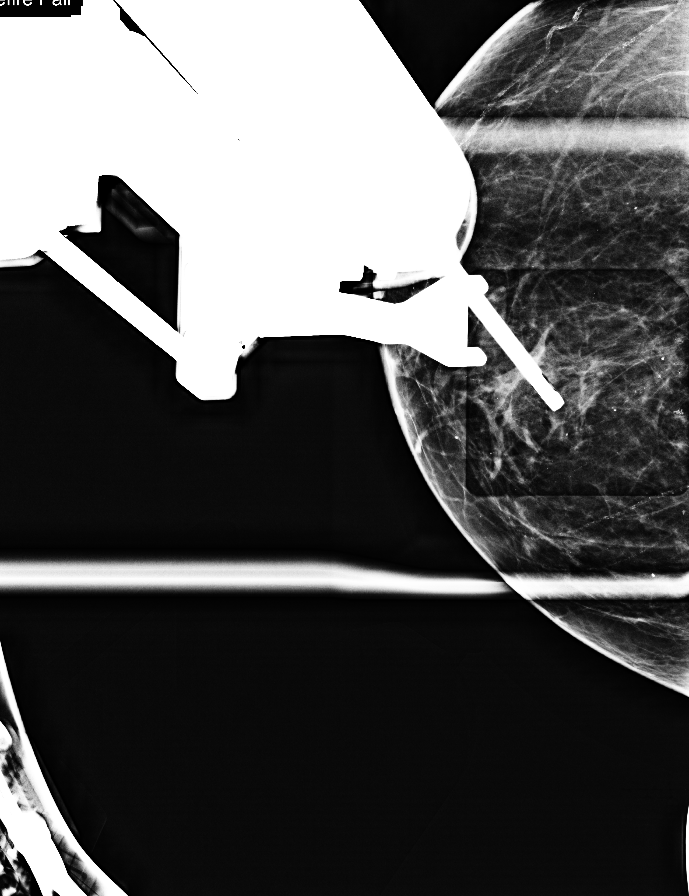

[L CC (7 of 7)]
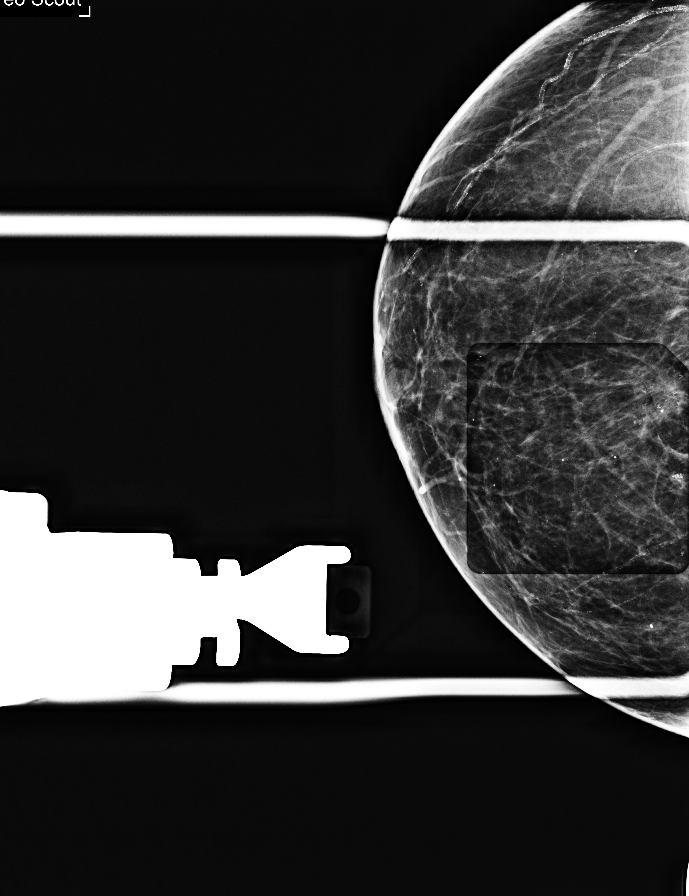

[8 of 19 positions shown; findings below may reference images not displayed]



Using sterile technique and 1% Lidocaine as local anesthetic, under
stereotactic guidance, a 9 gauge stereotactic vacuum assisted device
was used to perform core needle biopsy of calcifications lateral and
anterior to the left lumpectomy site label #1 using a cranial
approach. Specimen radiograph was performed showing inclusion of
calcifications of concern. Specimens with calcifications are
identified for pathology. A coil clip was placed.

Using sterile technique and 1% Lidocaine as local anesthetic, under
stereotactic guidance, a 9 gauge stereotactic vacuum assisted device
was used to perform core needle biopsy of calcifications at left
lumpectomy site labral #2 using a lateral approach. Specimen
radiograph was performed showing inclusion of calcifications of
concern. Specimens with calcifications are identified for pathology.
A X shaped clip was placed

Follow-up 2-view mammogram was performed and dictated separately.
IMPRESSION: Stereotactic-guided biopsies of the calcifications of left breast.
No apparent complications.

## 2018-04-28 ENCOUNTER — Emergency Department (HOSPITAL_COMMUNITY)
Admission: EM | Admit: 2018-04-28 | Discharge: 2018-04-28 | Disposition: A | Discharge status: Home / Self Care | Payer: PPO | Attending: Emergency Medicine | Admitting: Emergency Medicine

## 2018-04-28 ENCOUNTER — Other Ambulatory Visit: Payer: Self-pay

## 2018-04-28 ENCOUNTER — Emergency Department (HOSPITAL_COMMUNITY): Payer: PPO

## 2018-04-28 ENCOUNTER — Encounter (HOSPITAL_COMMUNITY): Payer: Self-pay | Admitting: Emergency Medicine

## 2018-04-28 DIAGNOSIS — I10 Essential (primary) hypertension: Secondary | ICD-10-CM | POA: Diagnosis not present

## 2018-04-28 DIAGNOSIS — Z853 Personal history of malignant neoplasm of breast: Secondary | ICD-10-CM | POA: Insufficient documentation

## 2018-04-28 DIAGNOSIS — N132 Hydronephrosis with renal and ureteral calculous obstruction: Secondary | ICD-10-CM | POA: Diagnosis not present

## 2018-04-28 DIAGNOSIS — Z7982 Long term (current) use of aspirin: Secondary | ICD-10-CM | POA: Diagnosis not present

## 2018-04-28 DIAGNOSIS — Z7984 Long term (current) use of oral hypoglycemic drugs: Secondary | ICD-10-CM | POA: Diagnosis not present

## 2018-04-28 DIAGNOSIS — Z87891 Personal history of nicotine dependence: Secondary | ICD-10-CM | POA: Diagnosis not present

## 2018-04-28 DIAGNOSIS — R1084 Generalized abdominal pain: Secondary | ICD-10-CM | POA: Diagnosis present

## 2018-04-28 DIAGNOSIS — E119 Type 2 diabetes mellitus without complications: Secondary | ICD-10-CM | POA: Diagnosis not present

## 2018-04-28 DIAGNOSIS — R109 Unspecified abdominal pain: Secondary | ICD-10-CM | POA: Diagnosis not present

## 2018-04-28 DIAGNOSIS — Z79899 Other long term (current) drug therapy: Secondary | ICD-10-CM | POA: Diagnosis not present

## 2018-04-28 DIAGNOSIS — N200 Calculus of kidney: Secondary | ICD-10-CM | POA: Insufficient documentation

## 2018-04-28 DIAGNOSIS — E782 Mixed hyperlipidemia: Secondary | ICD-10-CM | POA: Diagnosis not present

## 2018-04-28 LAB — BASIC METABOLIC PANEL
ANION GAP: 10 (ref 5–15)
BUN: 34 mg/dL — AB (ref 6–20)
CALCIUM: 10.1 mg/dL (ref 8.9–10.3)
CO2: 27 mmol/L (ref 22–32)
Chloride: 102 mmol/L (ref 101–111)
Creatinine, Ser: 1.49 mg/dL — ABNORMAL HIGH (ref 0.44–1.00)
GFR calc Af Amer: 39 mL/min — ABNORMAL LOW (ref >60)
GFR calc non Af Amer: 34 mL/min — ABNORMAL LOW (ref >60)
GLUCOSE: 111 mg/dL — AB (ref 65–99)
POTASSIUM: 4.6 mmol/L (ref 3.5–5.1)
Sodium: 139 mmol/L (ref 135–145)

## 2018-04-28 LAB — URINALYSIS, ROUTINE W REFLEX MICROSCOPIC
Bilirubin Urine: NEGATIVE
GLUCOSE, UA: NEGATIVE mg/dL
KETONES UR: NEGATIVE mg/dL
LEUKOCYTES UA: NEGATIVE
NITRITE: NEGATIVE
PH: 6 (ref 5.0–8.0)
PROTEIN: NEGATIVE mg/dL
Specific Gravity, Urine: 1.016 (ref 1.005–1.030)

## 2018-04-28 LAB — CBC WITH DIFFERENTIAL/PLATELET
Basophils Absolute: 0 10*3/uL (ref 0.0–0.1)
Basophils Relative: 0 %
EOS PCT: 1 %
Eosinophils Absolute: 0.1 10*3/uL (ref 0.0–0.7)
HEMATOCRIT: 39.3 % (ref 36.0–46.0)
Hemoglobin: 12.7 g/dL (ref 12.0–15.0)
LYMPHS PCT: 14 %
Lymphs Abs: 0.9 10*3/uL (ref 0.7–4.0)
MCH: 29 pg (ref 26.0–34.0)
MCHC: 32.3 g/dL (ref 30.0–36.0)
MCV: 89.7 fL (ref 78.0–100.0)
MONO ABS: 0.5 10*3/uL (ref 0.1–1.0)
MONOS PCT: 7 %
NEUTROS ABS: 5.2 10*3/uL (ref 1.7–7.7)
Neutrophils Relative %: 78 %
PLATELETS: 219 10*3/uL (ref 150–400)
RBC: 4.38 MIL/uL (ref 3.87–5.11)
RDW: 14.2 % (ref 11.5–15.5)
WBC: 6.6 10*3/uL (ref 4.0–10.5)

## 2018-04-28 MED ORDER — TAMSULOSIN HCL 0.4 MG PO CAPS: 0.4000 mg | ORAL_CAPSULE | Freq: Every day | ORAL | 0 refills | Status: DC

## 2018-04-28 MED ORDER — OXYCODONE-ACETAMINOPHEN 5-325 MG PO TABS: 1.0000 | ORAL_TABLET | Freq: Three times a day (TID) | ORAL | 0 refills | Status: DC | PRN

## 2018-04-28 MED ORDER — OXYCODONE-ACETAMINOPHEN 5-325 MG PO TABS
1.0000 | ORAL_TABLET | Freq: Once | ORAL | Status: AC
  Administered 2018-04-28: 1 via ORAL
  Filled 2018-04-28: qty 1

## 2018-04-28 NOTE — ED Provider Notes (Signed)
[facility] EMERGENCY DEPARTMENT Provider Note   CSN: [id number] Arrival date & time: 04/28/18  3151     History   Chief Complaint Chief Complaint  Patient presents with  . Flank Pain    HPI [name] is a 72 y.o. female with a past medical history of diabetes, hypertension, breast cancer, kidney stones, presents to ED for evaluation of intermittent sharp left-sided flank pain radiating down to the left lower quadrant that began yesterday.  She reports associated nausea.  She had a history of kidney stones in the past and states that this feels similar.  She last had a stent placed on the right side for a kidney stone one year ago.  She was told at that time she had a small 2 mm nonobstructing stone on the left side.  She believes this is causing her pain.  She took 1 dose of Tylenol with improvement in her symptoms last night.  She denies any vomiting, dysuria, hematuria, bowel changes, injuries or falls, numbness in legs, chest pain, shortness of breath, fever.  HPI  Past Medical History:  Diagnosis Date  . Anemia    hx of   . Arthritis   . Breast cancer ([place]) 06/26/15   left  . Diabetes mellitus    type II   . Disorder of bone and cartilage, unspecified   . Essential hypertension, benign   . GERD (gastroesophageal reflux disease)   . Hepatitis    Hep A as a child  . History of kidney stones   . Nonspecific abnormal finding in stool contents 01/11/2012  . Other and unspecified hyperlipidemia     Patient Active Problem List   Diagnosis Date Noted  . Special screening for malignant neoplasms, colon   . Actinic keratosis 01/12/2016  . Benign essential hypertension 01/12/2016  . Gastroesophageal reflux disease without esophagitis 01/12/2016  . Mixed hyperlipidemia 01/12/2016  . Osteopenia 01/12/2016  . Seborrheic keratoses 01/12/2016  . 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 ([place]) 10/16/2015  . Nonspecific abnormal finding in stool contents  01/11/2012  . Diabetes mellitus without mention of complication 76/16/0737    Past Surgical History:  Procedure Laterality Date  . ABDOMINAL HYSTERECTOMY    . AXILLARY SENTINEL NODE BIOPSY Left 07/31/2015   Procedure: SENTINEL LYMPH NODE BIOPSY, LEFT AXILLA, ;  Surgeon: Aviva Signs, MD;  Location: [facility];  Service: General;  Laterality: Left;  Sentinel Node @ 1000  . BREAST BIOPSY Left 06/09/2016   Procedure: LEFT BREAST BIOPSY AFTER NEEDLE LOCALIZATION;  Surgeon: Aviva Signs, MD;  Location: [facility];  Service: General;  Laterality: Left;  . CARPAL TUNNEL RELEASE Left 11/02/2014   Procedure: CARPAL TUNNEL RELEASE;  Surgeon: Charlotte Crumb, MD;  Location: [facility];  Service: Orthopedics;  Laterality: Left;  . CATARACT EXTRACTION Bilateral 2007  . COLONOSCOPY N/A 02/03/2017   Procedure: COLONOSCOPY;  Surgeon: Danie Binder, MD;  Location: [facility];  Service: Endoscopy;  Laterality: N/A;  1245  . COLONOSCOPY W/ BIOPSIES AND POLYPECTOMY    . CYSTOSCOPY/URETEROSCOPY/HOLMIUM LASER/STENT PLACEMENT Bilateral 03/18/2017   Procedure: CYSTOSCOPY/ BILATERAL RETROGRADE/RIGHT URETEROSCOPY/ RIGHT HOLMIUM LASER APPLICATION/RIGHT URETERAL STENT PLACEMENT;  Surgeon: Irine Seal, MD;  Location: [facility];  Service: Urology;  Laterality: Bilateral;  . FOOT SURGERY Left 2008   fractured foot  . LITHOTRIPSY    . OPEN REDUCTION INTERNAL FIXATION (ORIF) DISTAL RADIAL FRACTURE Left 11/02/2014   Procedure: OPEN REDUCTION INTERNAL FIXATION (ORIF) DISTAL RADIAL FRACTURE;  Surgeon: Charlotte Crumb, MD;  Location: [facility];  Service: Orthopedics;  Laterality: Left;  . PARTIAL MASTECTOMY WITH NEEDLE LOCALIZATION Left 06/26/2015   Procedure: PARTIAL MASTECTOMY WITH NEEDLE LOCALIZATION;  Surgeon: Aviva Signs, MD;  Location: [facility];  Service: General;  Laterality: Left;  Needle Loc @ 8:00am  . POLYPECTOMY  02/03/2017   Procedure: POLYPECTOMY;  Surgeon: Danie Binder, MD;  [facility];  Service: Endoscopy;;   descending and hepatic flexure     OB History   None      Home Medications    Prior to Admission medications   Medication Sig Start Date End Date Taking? Authorizing Provider  anastrozole (ARIMIDEX) 1 MG tablet Take 1 tablet (1 mg total) by mouth daily. 12/31/17  Yes Baird Cancer, PA-C  Cholecalciferol (VITAMIN D3) 1000 units CAPS Take 1,000 Units by mouth at bedtime.   Yes [provider]  fenofibrate 160 MG tablet Take 160 mg by mouth daily.   Yes [provider]  losartan-hydrochlorothiazide (HYZAAR) 50-12.5 MG per tablet Take 1 tablet by mouth daily.   Yes [provider]  Magnesium 400 MG TABS Take 1 tablet by mouth daily.   Yes [provider]  metFORMIN (GLUCOPHAGE) 1000 MG tablet Take 1,000 mg by mouth 2 (two) times daily with a meal.   Yes [provider]  Multiple Vitamin (MULTIVITAMIN WITH MINERALS) TABS tablet Take 1 tablet by mouth daily. Women's One A Day 50+   Yes [provider]  Omega-3 Fatty Acids (FISH OIL) 1200 MG CAPS Take 1,200 mg by mouth 2 (two) times daily.   Yes [provider]  omeprazole (PRILOSEC) 20 MG capsule Take 20 mg by mouth daily.   Yes [provider]  simvastatin (ZOCOR) 40 MG tablet Take 40 mg by mouth at bedtime.    Yes [provider]  acetaminophen (TYLENOL 8 HOUR ARTHRITIS PAIN) 650 MG CR tablet Take 650 mg by mouth every 8 (eight) hours as needed for pain.    [provider]  aspirin EC 81 MG tablet Take 81 mg by mouth every other day.    [provider]  oxyCODONE-acetaminophen (PERCOCET/ROXICET) 5-325 MG tablet Take 1 tablet by mouth every 8 (eight) hours as needed for severe pain. 04/28/18   Dannis Deroche, PA-C  tamsulosin (FLOMAX) 0.4 MG CAPS capsule Take 1 capsule (0.4 mg total) by mouth daily after supper. 04/28/18   Delia Heady, PA-C    Family History Family History  Problem Relation Age of Onset  . Hypertension Mother   .  Osteoporosis Mother   . Diabetes Sister        x3  . Colon cancer Neg Hx     Social History Social History   Tobacco Use  . Smoking status: Former Smoker    Packs/day: 1.00    Years: 20.00    Pack years: 20.00    Types: Cigarettes    Last attempt to quit: 06/05/1986    Years since quitting: 31.9  . Smokeless tobacco: Never Used  Substance Use Topics  . Alcohol use: No  . Drug use: No     Allergies   Patient has no known allergies.   Review of Systems Review of Systems  Constitutional: Negative for appetite change, chills and fever.  HENT: Negative for ear pain, rhinorrhea, sneezing and sore throat.   Eyes: Negative for photophobia and visual disturbance.  Respiratory: Negative for cough, chest tightness, shortness of breath and wheezing.   Cardiovascular: Negative for chest pain and  palpitations.  Gastrointestinal: Positive for nausea. Negative for abdominal pain, blood in stool, constipation, diarrhea and vomiting.  Genitourinary: Positive for flank pain. Negative for dysuria, hematuria and urgency.  Musculoskeletal: Negative for myalgias.  Skin: Negative for rash.  Neurological: Negative for dizziness, weakness and light-headedness.     Physical Exam Updated Vital Signs BP (!) 154/77   Pulse 84   Temp 97.8 F (36.6 C) (Oral)   Resp 16   Ht 5' 4.5" (1.638 m)   Wt 77.1 kg (170 lb)   SpO2 96%   BMI 28.73 kg/m   Physical Exam  Constitutional: She appears well-developed and well-nourished. No distress.  HENT:  Head: Normocephalic and atraumatic.  Nose: Nose normal.  Eyes: Conjunctivae and EOM are normal. Left eye exhibits no discharge. No scleral icterus.  Neck: Normal range of motion. Neck supple.  Cardiovascular: Normal rate, regular rhythm, normal heart sounds and intact distal pulses. Exam reveals no gallop and no friction rub.  No murmur heard. Pulmonary/Chest: Effort normal and breath sounds normal. No respiratory distress.  Abdominal: Soft. Bowel  sounds are normal. She exhibits no distension. There is tenderness (L flank) in the left lower quadrant. There is no guarding.  Musculoskeletal: Normal range of motion. She exhibits no edema.  Neurological: She is alert. She exhibits normal muscle tone. Coordination normal.  Skin: Skin is warm and dry. No rash noted.  Psychiatric: She has a normal mood and affect.  Nursing note and vitals reviewed.    ED Treatments / Results  Labs (all labs ordered are listed, but only abnormal results are displayed) Labs Reviewed  URINALYSIS, ROUTINE W REFLEX MICROSCOPIC - Abnormal; Notable for the following components:      Result Value   Hgb urine dipstick MODERATE (*)    Bacteria, UA RARE (*)    All other components within normal limits  BASIC METABOLIC PANEL - Abnormal; Notable for the following components:   Glucose, Bld 111 (*)    BUN 34 (*)    Creatinine, Ser 1.49 (*)    GFR calc non Af Amer 34 (*)    GFR calc Af Amer 39 (*)    All other components within normal limits  URINE CULTURE  CBC WITH DIFFERENTIAL/PLATELET    EKG None  Radiology Ct Renal Stone Study  Result Date: 04/28/2018 CLINICAL DATA:  Left-sided flank pain.  History of kidney stones. EXAM: CT ABDOMEN AND PELVIS WITHOUT CONTRAST TECHNIQUE: Multidetector CT imaging of the abdomen and pelvis was performed following the standard protocol without IV contrast. COMPARISON:  CT abdomen pelvis dated March 01, 2017. FINDINGS: Lower chest: No acute abnormality. Unchanged bibasilar atelectasis/scarring. Hepatobiliary: No focal liver abnormality is seen. No gallstones, gallbladder wall thickening, or biliary dilatation. Pancreas: Unremarkable. No pancreatic ductal dilatation or surrounding inflammatory changes. Spleen: Stable 2 cm hypodense lesion, not significantly changed since April 2011. Normal in size. Adrenals/Urinary Tract: Unchanged nodular thickening of both adrenal glands. Multiple calculi within the left proximal ureter with  resultant mild to moderate left hydronephrosis. The largest, most distal calculus measures 11 mm. Additional nonobstructive calculi in the left lower pole, measuring up to 8 mm. 8 mm calculus in the left renal pelvis. Several nonobstructive right renal calculi measuring up to 6 mm. The bladder is decompressed. Stomach/Bowel: Stomach is within normal limits. Appendix appears normal. No evidence of bowel wall thickening, distention, or inflammatory changes. Mild sigmoid diverticulosis. Vascular/Lymphatic: Aortic atherosclerosis. No enlarged abdominal or pelvic lymph nodes. Reproductive: Status post hysterectomy. No adnexal masses. Other: Unchanged  anterior lower abdominal/pelvic wall hernia containing nondilated loops of small bowel. No free fluid or pneumoperitoneum. Musculoskeletal: No acute or significant osseous findings. IMPRESSION: 1. Multiple obstructive calculi within the left proximal ureter with resultant mild to moderate left hydronephrosis. The largest, most distal calculus measures 11 mm. Additional 8 mm calculus within the left renal pelvis. 2. Additional bilateral nonobstructive renal calculi measuring up to 8 mm on the left and 6 mm on the right. 3. Unchanged anterior lower abdominal/pelvic wall hernia containing nondilated loops of small bowel. 4.  Aortic atherosclerosis (ICD10-I70.0). Electronically Signed   By: Titus Dubin M.D.   On: 04/28/2018 10:55    Procedures Procedures (including critical care time)  Medications Ordered in ED Medications  oxyCODONE-acetaminophen (PERCOCET/ROXICET) 5-325 MG per tablet 1 tablet (1 tablet Oral Given 04/28/18 1104)     Initial Impression / Assessment and Plan / ED Course  I have reviewed the triage vital signs and the nursing notes.  Pertinent labs & imaging results that were available during my care of the patient were reviewed by me and considered in my medical decision making (see chart for details).  Clinical Course as of Apr 28 1212  Thu  Apr 28, 2018  1159 Patient reports improvement in pain with Percocet given here. CT shows 69mm and 60mm obstructing caluli in proximal L ureter and renal pelvis. Will consult urology (patient of [facility]).    [HK]    Clinical Course User Index [HK] Delia Heady, PA-C    72 year old female with a past medical history of diabetes, hypertension, kidney stones presents to ED for evaluation of intermittent sharp left-sided flank pain radiating down to the left lower quadrant that began yesterday.  She reports associated nausea.  She had a history of kidney stones in the past and states that this feels similar.  She had a stent placed on the right side for kidney stone one year ago but no symptoms since then.  Denies any vomiting, dysuria, hematuria, fever, bowel changes.  On physical exam she has left flank and left lower quadrant tenderness to palpation.  She is afebrile.  Otherwise well-appearing.  Urinalysis shows rare bacteria but otherwise unremarkable.  CBC, BMP shows mild increase in creatinine than baseline.  She does have a history of kidney disease.  CT renal stone study shows multiple obstructing renal calculi with the largest being 11 mm also 8 mm.  Her pain is well controlled here with Percocet.  I spoke to Dr. Noah Delaine of [facility] where patient is established.  He recommends following up in the clinic tomorrow for further evaluation.  In the meantime recommends pain medication, Flomax and returning for any severe worsening symptoms.  Patient is agreeable and comfortable with this plan.  Portions of this note were generated with [employer]. Dictation errors may occur despite best attempts at proofreading.   Final Clinical Impressions(s) / ED Diagnoses   Final diagnoses:  Nephrolithiasis    ED Discharge Orders        Ordered    oxyCODONE-acetaminophen (PERCOCET/ROXICET) 5-325 MG tablet  Every 8 hours PRN     04/28/18 1212    tamsulosin (FLOMAX) 0.4 MG  CAPS capsule  Daily after supper     04/28/18 1212       Delia Heady, PA-C 04/28/18 1215    Virgel Manifold, MD 04/29/18 1356

## 2018-04-28 NOTE — ED Triage Notes (Signed)
Patient c/o non-radiating, left side flank pain that started yesterday. Per patient nausea but no vomiting. Denies any dysuria, hematuria, fevers, or diarrhea. Per patient hx of kidney stones.

## 2018-04-28 NOTE — Discharge Instructions (Signed)
Please call your urologist office to schedule an appointment for tomorrow. Take pain medication as prescribed as needed.

## 2018-04-28 NOTE — ED Notes (Signed)
  Ambulated to bathroom without assistance.  Tolerated well.

## 2018-04-28 NOTE — ED Notes (Signed)
Lab tech in.

## 2018-04-29 DIAGNOSIS — N202 Calculus of kidney with calculus of ureter: Secondary | ICD-10-CM | POA: Diagnosis not present

## 2018-04-29 LAB — URINE CULTURE: Culture: <10000 — AB

## 2018-05-02 ENCOUNTER — Other Ambulatory Visit: Payer: Self-pay | Admitting: Urology

## 2018-05-04 ENCOUNTER — Other Ambulatory Visit: Payer: Self-pay

## 2018-05-04 ENCOUNTER — Encounter (HOSPITAL_BASED_OUTPATIENT_CLINIC_OR_DEPARTMENT_OTHER): Payer: Self-pay | Admitting: *Deleted

## 2018-05-04 NOTE — Progress Notes (Signed)
Spoke w/ pt via phone for pre-op interview.  Npo after mn w/ exception clear liquids until 0745 (no cream/milk products).  Arrive at 1145.  Needs ekg.  Current lab results in chart and epic, dated 04-28-2018.  Will take prilosec and arimidex am dos w/ sips of water , if needed take oxycodone/ tylenol.

## 2018-05-09 ENCOUNTER — Ambulatory Visit (HOSPITAL_BASED_OUTPATIENT_CLINIC_OR_DEPARTMENT_OTHER): Payer: PPO | Admitting: Anesthesiology

## 2018-05-09 ENCOUNTER — Ambulatory Visit (HOSPITAL_BASED_OUTPATIENT_CLINIC_OR_DEPARTMENT_OTHER)
Admission: RE | Admit: 2018-05-09 | Discharge: 2018-05-09 | Disposition: A | Discharge status: Home / Self Care | Payer: PPO | Source: Ambulatory Visit | Attending: Urology | Admitting: Urology

## 2018-05-09 ENCOUNTER — Encounter (HOSPITAL_BASED_OUTPATIENT_CLINIC_OR_DEPARTMENT_OTHER): Payer: Self-pay

## 2018-05-09 ENCOUNTER — Encounter (HOSPITAL_BASED_OUTPATIENT_CLINIC_OR_DEPARTMENT_OTHER)
Admission: RE | Disposition: A | Discharge status: Home / Self Care | Payer: Self-pay | Source: Ambulatory Visit | Attending: Urology

## 2018-05-09 DIAGNOSIS — E1122 Type 2 diabetes mellitus with diabetic chronic kidney disease: Secondary | ICD-10-CM | POA: Diagnosis not present

## 2018-05-09 DIAGNOSIS — N201 Calculus of ureter: Secondary | ICD-10-CM | POA: Diagnosis not present

## 2018-05-09 DIAGNOSIS — K219 Gastro-esophageal reflux disease without esophagitis: Secondary | ICD-10-CM | POA: Insufficient documentation

## 2018-05-09 DIAGNOSIS — E119 Type 2 diabetes mellitus without complications: Secondary | ICD-10-CM | POA: Diagnosis not present

## 2018-05-09 DIAGNOSIS — R928 Other abnormal and inconclusive findings on diagnostic imaging of breast: Secondary | ICD-10-CM

## 2018-05-09 DIAGNOSIS — I1310 Hypertensive heart and chronic kidney disease without heart failure, with stage 1 through stage 4 chronic kidney disease, or unspecified chronic kidney disease: Secondary | ICD-10-CM | POA: Diagnosis not present

## 2018-05-09 DIAGNOSIS — Z7984 Long term (current) use of oral hypoglycemic drugs: Secondary | ICD-10-CM | POA: Insufficient documentation

## 2018-05-09 DIAGNOSIS — E782 Mixed hyperlipidemia: Secondary | ICD-10-CM | POA: Insufficient documentation

## 2018-05-09 DIAGNOSIS — N1330 Unspecified hydronephrosis: Secondary | ICD-10-CM | POA: Diagnosis not present

## 2018-05-09 DIAGNOSIS — Z79899 Other long term (current) drug therapy: Secondary | ICD-10-CM | POA: Insufficient documentation

## 2018-05-09 DIAGNOSIS — Z87891 Personal history of nicotine dependence: Secondary | ICD-10-CM | POA: Diagnosis not present

## 2018-05-09 DIAGNOSIS — N183 Chronic kidney disease, stage 3 (moderate): Secondary | ICD-10-CM | POA: Diagnosis not present

## 2018-05-09 DIAGNOSIS — Z7982 Long term (current) use of aspirin: Secondary | ICD-10-CM | POA: Insufficient documentation

## 2018-05-09 DIAGNOSIS — N202 Calculus of kidney with calculus of ureter: Secondary | ICD-10-CM | POA: Diagnosis not present

## 2018-05-09 HISTORY — DX: Personal history of irradiation: Z92.3

## 2018-05-09 HISTORY — DX: Type 2 diabetes mellitus without complications: E11.9

## 2018-05-09 HISTORY — DX: Presence of spectacles and contact lenses: Z97.3

## 2018-05-09 HISTORY — DX: Complete loss of teeth, unspecified cause, unspecified class: K08.109

## 2018-05-09 HISTORY — DX: Personal history of other benign neoplasm: Z86.018

## 2018-05-09 HISTORY — DX: Estrogen receptor positive status (ER+): Z17.0

## 2018-05-09 HISTORY — DX: Iron deficiency anemia, unspecified: D50.9

## 2018-05-09 HISTORY — PX: CYSTOSCOPY WITH RETROGRADE PYELOGRAM, URETEROSCOPY AND STENT PLACEMENT: SHX5789

## 2018-05-09 HISTORY — DX: Calculus of kidney: N20.0

## 2018-05-09 HISTORY — DX: Chronic kidney disease, stage 3 unspecified: N18.30

## 2018-05-09 HISTORY — DX: Complete loss of teeth, unspecified cause, unspecified class: Z97.2

## 2018-05-09 HISTORY — DX: Calculus of ureter: N20.1

## 2018-05-09 HISTORY — DX: Chronic kidney disease, stage 3 (moderate): N18.3

## 2018-05-09 HISTORY — DX: Mixed hyperlipidemia: E78.2

## 2018-05-09 HISTORY — DX: Estrogen receptor positive status (ER+): C50.312

## 2018-05-09 HISTORY — DX: Unspecified abdominal hernia without obstruction or gangrene: K46.9

## 2018-05-09 HISTORY — DX: Urgency of urination: R39.15

## 2018-05-09 HISTORY — PX: HOLMIUM LASER APPLICATION: SHX5852

## 2018-05-09 LAB — GLUCOSE, CAPILLARY
GLUCOSE-CAPILLARY: 90 mg/dL (ref 65–99)
Glucose-Capillary: 104 mg/dL — ABNORMAL HIGH (ref 65–99)

## 2018-05-09 SURGERY — CYSTOURETEROSCOPY, WITH RETROGRADE PYELOGRAM AND STENT INSERTION
Anesthesia: General | Site: Ureter | Laterality: Left

## 2018-05-09 MED ORDER — DEXAMETHASONE SODIUM PHOSPHATE 10 MG/ML IJ SOLN
INTRAMUSCULAR | Status: AC
  Filled 2018-05-09: qty 1

## 2018-05-09 MED ORDER — CEFAZOLIN SODIUM-DEXTROSE 2-4 GM/100ML-% IV SOLN
2.0000 g | INTRAVENOUS | Status: AC
  Administered 2018-05-09: 2 g via INTRAVENOUS
  Filled 2018-05-09: qty 100

## 2018-05-09 MED ORDER — LIDOCAINE 2% (20 MG/ML) 5 ML SYRINGE
INTRAMUSCULAR | Status: DC | PRN
  Administered 2018-05-09: 60 mg via INTRAVENOUS

## 2018-05-09 MED ORDER — FENTANYL CITRATE (PF) 100 MCG/2ML IJ SOLN
INTRAMUSCULAR | Status: AC
  Filled 2018-05-09: qty 2

## 2018-05-09 MED ORDER — OXYCODONE HCL 5 MG PO TABS
5.0000 mg | ORAL_TABLET | Freq: Once | ORAL | Status: AC | PRN
  Administered 2018-05-09: 5 mg via ORAL
  Filled 2018-05-09: qty 1

## 2018-05-09 MED ORDER — FENTANYL CITRATE (PF) 100 MCG/2ML IJ SOLN
25.0000 ug | INTRAMUSCULAR | Status: DC | PRN
  Administered 2018-05-09: 25 ug via INTRAVENOUS
  Filled 2018-05-09: qty 1

## 2018-05-09 MED ORDER — CEFAZOLIN SODIUM-DEXTROSE 2-4 GM/100ML-% IV SOLN
INTRAVENOUS | Status: AC
  Filled 2018-05-09: qty 100

## 2018-05-09 MED ORDER — SODIUM CHLORIDE 0.9 % IR SOLN
Status: DC | PRN
  Administered 2018-05-09: 3000 mL via INTRAVESICAL

## 2018-05-09 MED ORDER — IOHEXOL 300 MG/ML  SOLN
INTRAMUSCULAR | Status: DC | PRN
Start: 2018-05-09 — End: 2018-05-09
  Administered 2018-05-09: 3 mL via URETHRAL

## 2018-05-09 MED ORDER — ONDANSETRON HCL 4 MG/2ML IJ SOLN
INTRAMUSCULAR | Status: DC | PRN
  Administered 2018-05-09: 4 mg via INTRAVENOUS

## 2018-05-09 MED ORDER — LIDOCAINE 2% (20 MG/ML) 5 ML SYRINGE
INTRAMUSCULAR | Status: AC
  Filled 2018-05-09: qty 5

## 2018-05-09 MED ORDER — OXYCODONE HCL 5 MG/5ML PO SOLN
5.0000 mg | Freq: Once | ORAL | Status: AC | PRN
  Filled 2018-05-09: qty 5

## 2018-05-09 MED ORDER — OXYCODONE-ACETAMINOPHEN 5-325 MG PO TABS: 1.0000 | ORAL_TABLET | ORAL | 0 refills | Status: DC | PRN

## 2018-05-09 MED ORDER — PROMETHAZINE HCL 25 MG/ML IJ SOLN
6.2500 mg | INTRAMUSCULAR | Status: DC | PRN
  Filled 2018-05-09: qty 1

## 2018-05-09 MED ORDER — OXYCODONE HCL 5 MG PO TABS
ORAL_TABLET | ORAL | Status: AC
  Filled 2018-05-09: qty 1

## 2018-05-09 MED ORDER — PROPOFOL 10 MG/ML IV BOLUS
INTRAVENOUS | Status: DC | PRN
  Administered 2018-05-09: 150 mg via INTRAVENOUS

## 2018-05-09 MED ORDER — PHENYLEPHRINE 40 MCG/ML (10ML) SYRINGE FOR IV PUSH (FOR BLOOD PRESSURE SUPPORT)
PREFILLED_SYRINGE | INTRAVENOUS | Status: AC
  Filled 2018-05-09: qty 10

## 2018-05-09 MED ORDER — PHENYLEPHRINE 40 MCG/ML (10ML) SYRINGE FOR IV PUSH (FOR BLOOD PRESSURE SUPPORT)
PREFILLED_SYRINGE | INTRAVENOUS | Status: DC | PRN
  Administered 2018-05-09 (×2): 80 ug via INTRAVENOUS

## 2018-05-09 MED ORDER — TAMSULOSIN HCL 0.4 MG PO CAPS: 0.4000 mg | ORAL_CAPSULE | Freq: Every day | ORAL | 0 refills | Status: DC

## 2018-05-09 MED ORDER — DEXAMETHASONE SODIUM PHOSPHATE 4 MG/ML IJ SOLN
INTRAMUSCULAR | Status: DC | PRN
  Administered 2018-05-09: 4 mg via INTRAVENOUS

## 2018-05-09 MED ORDER — ONDANSETRON HCL 4 MG/2ML IJ SOLN
INTRAMUSCULAR | Status: AC
  Filled 2018-05-09: qty 2

## 2018-05-09 MED ORDER — PROPOFOL 10 MG/ML IV BOLUS
INTRAVENOUS | Status: AC
  Filled 2018-05-09: qty 20

## 2018-05-09 MED ORDER — FENTANYL CITRATE (PF) 100 MCG/2ML IJ SOLN
INTRAMUSCULAR | Status: DC | PRN
  Administered 2018-05-09: 50 ug via INTRAVENOUS
  Administered 2018-05-09 (×2): 25 ug via INTRAVENOUS

## 2018-05-09 MED ORDER — SODIUM CHLORIDE 0.9 % IV SOLN
INTRAVENOUS | Status: DC
  Administered 2018-05-09 (×2): via INTRAVENOUS
  Filled 2018-05-09: qty 1000

## 2018-05-09 SURGICAL SUPPLY — 29 items
BAG DRAIN URO-CYSTO SKYTR STRL (DRAIN) ×3 IMPLANT
BAG DRN UROCATH (DRAIN) ×1
BASKET STONE 1.7 NGAGE (UROLOGICAL SUPPLIES) ×6 IMPLANT
CATH FOLEY 2WAY  3CC  8FR (CATHETERS) ×2
CATH FOLEY 2WAY 3CC 8FR (CATHETERS) IMPLANT
CATH INTERMIT  6FR 70CM (CATHETERS) IMPLANT
CLOTH BEACON ORANGE TIMEOUT ST (SAFETY) ×3 IMPLANT
EXTRACTOR STONE 1.7FRX115CM (UROLOGICAL SUPPLIES) IMPLANT
FIBER LASER FLEXIVA 365 (UROLOGICAL SUPPLIES) IMPLANT
FIBER LASER TRAC TIP (UROLOGICAL SUPPLIES) ×2 IMPLANT
GLOVE BIO SURGEON STRL SZ8 (GLOVE) ×3 IMPLANT
GOWN STRL REUS W/TWL XL LVL3 (GOWN DISPOSABLE) ×3 IMPLANT
GUIDEWIRE ANG ZIPWIRE 038X150 (WIRE) ×3 IMPLANT
GUIDEWIRE STR DUAL SENSOR (WIRE) IMPLANT
INFUSOR MANOMETER BAG 3000ML (MISCELLANEOUS) ×1 IMPLANT
IV NS 1000ML (IV SOLUTION) ×3
IV NS 1000ML BAXH (IV SOLUTION) ×1 IMPLANT
IV NS IRRIG 3000ML ARTHROMATIC (IV SOLUTION) ×3 IMPLANT
KIT TURNOVER CYSTO (KITS) ×3 IMPLANT
MANIFOLD NEPTUNE II (INSTRUMENTS) ×3 IMPLANT
NS IRRIG 500ML POUR BTL (IV SOLUTION) ×3 IMPLANT
PACK CYSTO (CUSTOM PROCEDURE TRAY) ×3 IMPLANT
SHEATH URETERAL 12FRX35CM (MISCELLANEOUS) ×2 IMPLANT
STENT URET 6FRX24 CONTOUR (STENTS) ×2 IMPLANT
STENT URET 6FRX26 CONTOUR (STENTS) IMPLANT
SYR 10ML LL (SYRINGE) ×3 IMPLANT
TUBE CONNECTING 12'X1/4 (SUCTIONS) ×1
TUBE CONNECTING 12X1/4 (SUCTIONS) ×1 IMPLANT
TUBING UROLOGY SET (TUBING) ×2 IMPLANT

## 2018-05-09 NOTE — Anesthesia Procedure Notes (Signed)
Procedure Name: LMA Insertion Date/Time: 05/09/2018 1:12 PM Performed by: Lynda Rainwater, MD Pre-anesthesia Checklist: Patient identified, Emergency Drugs available, Suction available and Patient being monitored Patient Re-evaluated:Patient Re-evaluated prior to induction Oxygen Delivery Method: Circle system utilized Preoxygenation: Pre-oxygenation with 100% oxygen Induction Type: IV induction Ventilation: Mask ventilation without difficulty LMA: LMA inserted LMA Size: 4.0 Number of attempts: 1 Airway Equipment and Method: Bite block Placement Confirmation: positive ETCO2 Tube secured with: Tape Dental Injury: Teeth and Oropharynx as per pre-operative assessment

## 2018-05-09 NOTE — Op Note (Signed)
.  Preoperative diagnosis: Left ureteral stones  Postoperative diagnosis: Same  Procedure: 1 cystoscopy 2. Left retrograde pyelography 3.  Intraoperative fluoroscopy, under one hour, with interpretation 4.  Left ureteroscopic stone manipulation with laser lithotripsy 5.  Left 6 x 24 JJ stent placement  Attending: Rosie Fate  Anesthesia: General  Estimated blood loss: None  Drains: Left 6 x 24 JJ ureteral stent without tether  Specimens: stone for analysis  Antibiotics: ancef  Findings: over 10 left proximal ureteral calculi. 6 renal pelvis calculi. Moderate left hydronephrosis. No masses/lesions in the bladder. Ureteral orifices in normal anatomic location.  Indications: Patient is a 72 year old female with a history of numerous left ureteral stones and who has persistent left flank pain.  After discussing treatment options, she decided proceed with left ureteroscopic stone manipulation.  Procedure her in detail: The patient was brought to the operating room and a brief timeout was done to ensure correct patient, correct procedure, correct site.  General anesthesia was administered patient was placed in dorsal lithotomy position.  Her genitalia was then prepped and draped in usual sterile fashion.  A rigid 41 French cystoscope was passed in the urethra and the bladder.  Bladder was inspected free masses or lesions.  the ureteral orifices were in the normal orthotopic locations.  a 6 french ureteral catheter was then instilled into the left ureteral orifice.  a gentle retrograde was obtained and findings noted above.  we then placed a zip wire through the ureteral catheter and advanced up to the renal pelvis.  we then removed the cystoscope and cannulated the left ureteral orifice with a semirigid ureteroscope.  We encountered over 10 calculi in the proximal ureter. Using a 200nm laser fiber the stones were fragmented and the fragments were removed with an NGage basket. Once we reached  the UPJ a sensor wire was advanced in to the renal pelvis. We then removed the ureteroscope and advanced am 12/14 x 35cm access sheath up to the renal pelvis. We then used the flexible ureteroscope to perform nephroscopy. We encountered stones in the renal pelvis. Using the 200nm laser fibers the stones were dusted to less than 18mm fragments. Once this was complete we then removed the access sheath under direct vision and noted no injury to the ureter. We then placed a 6 x 24 double-j ureteral stent over the original zip wire.  We then removed the wire and good coil was noted in the the renal pelvis under fluoroscopy and the bladder under direct vision. the bladder was then drained and this concluded the procedure which was well tolerated by patient.  Complications: None  Condition: Stable, extubated, transferred to PACU  Plan: Patient is to be discharged home as to follow-up in one week for stent removal.

## 2018-05-09 NOTE — Transfer of Care (Signed)
  Last Vitals:  Vitals Value Taken Time  BP    Temp    Pulse 83 05/09/2018  2:53 PM  Resp 15 05/09/2018  2:53 PM  SpO2 94 % 05/09/2018  2:53 PM  Vitals shown include unvalidated device data.  Last Pain:  Vitals:   05/09/18 1141  TempSrc:   PainSc: 3       Patients Stated Pain Goal: 5 (05/09/18 1141) Immediate Anesthesia Transfer of Care Note  Patient: Larwance Sachs  Procedure(s) Performed: Procedure(s) (LRB): CYSTOSCOPY WITH RETROGRADE PYELOGRAM, URETEROSCOPY AND STENT PLACEMENT (Left) HOLMIUM LASER APPLICATION (Left)  Patient Location: PACU  Anesthesia Type: General  Level of Consciousness: awake, alert  and oriented  Airway & Oxygen Therapy: Patient Spontanous Breathing and Patient connected to face mask oxygen  Post-op Assessment: Report given to PACU RN and Post -op Vital signs reviewed and stable  Post vital signs: Reviewed and stable  Complications: No apparent anesthesia complications

## 2018-05-09 NOTE — Discharge Instructions (Signed)
Ureteral Stent Implantation, Care After °Refer to this sheet in the next few weeks. These instructions provide you with information about caring for yourself after your procedure. Your health care provider may also give you more specific instructions. Your treatment has been planned according to current medical practices, but problems sometimes occur. Call your health care provider if you have any problems or questions after your procedure. °What can I expect after the procedure? °After the procedure, it is common to have: °· Nausea. °· Mild pain when you urinate. You may feel this pain in your lower back or lower abdomen. Pain should stop within a few minutes after you urinate. This may last for up to 1 week. °· A small amount of blood in your urine for several days. ° °Follow these instructions at home: ° °Medicines °· Take over-the-counter and prescription medicines only as told by your health care provider. °· If you were prescribed an antibiotic medicine, take it as told by your health care provider. Do not stop taking the antibiotic even if you start to feel better. °· Do not drive for 24 hours if you received a sedative. °· Do not drive or operate heavy machinery while taking prescription pain medicines. °Activity °· Return to your normal activities as told by your health care provider. Ask your health care provider what activities are safe for you. °· Do not lift anything that is heavier than 10 lb (4.5 kg). Follow this limit for 1 week after your procedure, or for as long as told by your health care provider. °General instructions °· Watch for any blood in your urine. Call your health care provider if the amount of blood in your urine increases. °· If you have a catheter: °? Follow instructions from your health care provider about taking care of your catheter and collection bag. °? Do not take baths, swim, or use a hot tub until your health care provider approves. °· Drink enough fluid to keep your urine  clear or pale yellow. °· Keep all follow-up visits as told by your health care provider. This is important. °Contact a health care provider if: °· You have pain that gets worse or does not get better with medicine, especially pain when you urinate. °· You have difficulty urinating. °· You feel nauseous or you vomit repeatedly during a period of more than 2 days after the procedure. °Get help right away if: °· Your urine is dark red or has blood clots in it. °· You are leaking urine (have incontinence). °· The end of the stent comes out of your urethra. °· You cannot urinate. °· You have sudden, sharp, or severe pain in your abdomen or lower back. °· You have a fever. °This information is not intended to replace advice given to you by your health care provider. Make sure you discuss any questions you have with your health care provider. °Document Released: 07/05/2013 Document Revised: 04/09/2016 Document Reviewed: 05/17/2015 °Elsevier Interactive Patient Education © 2018 [employer] ° ° °Post Anesthesia Home Care Instructions ° °Activity: °Get plenty of rest for the remainder of the day. A responsible individual must stay with you for 24 hours following the procedure.  °For the next 24 hours, DO NOT: °-Drive a car °-Operate machinery °-Drink alcoholic beverages °-Take any medication unless instructed by your physician °-Make any legal decisions or sign important papers. ° °Meals: °Start with liquid foods such as gelatin or soup. Progress to regular foods as tolerated. Avoid greasy, spicy, heavy foods. If nausea   and/or vomiting occur, drink only clear liquids until the nausea and/or vomiting subsides. Call your physician if vomiting continues. ° °Special Instructions/Symptoms: °Your throat may feel dry or sore from the anesthesia or the breathing tube placed in your throat during surgery. If this causes discomfort, gargle with warm salt water. The discomfort should disappear within 24 hours. ° °If you had a  scopolamine patch placed behind your ear for the management of post- operative nausea and/or vomiting: ° °1. The medication in the patch is effective for 72 hours, after which it should be removed.  Wrap patch in a tissue and discard in the trash. Wash hands thoroughly with soap and water. °2. You may remove the patch earlier than 72 hours if you experience unpleasant side effects which may include dry mouth, dizziness or visual disturbances. °3. Avoid touching the patch. Wash your hands with soap and water after contact with the patch. °  ° °

## 2018-05-09 NOTE — Anesthesia Preprocedure Evaluation (Signed)
Anesthesia Evaluation  Patient identified by MRN, date of birth, ID band Patient awake    Reviewed: Allergy & Precautions, NPO status , Patient's Chart, lab work & pertinent test results  Airway Mallampati: II  TM Distance: >3 FB Neck ROM: Full    Dental no notable dental hx.    Pulmonary neg pulmonary ROS, former smoker,    Pulmonary exam normal breath sounds clear to auscultation       Cardiovascular hypertension, Normal cardiovascular exam Rhythm:Regular Rate:Normal     Neuro/Psych negative neurological ROS  negative psych ROS   GI/Hepatic Neg liver ROS, GERD  ,  Endo/Other  diabetes  Renal/GU negative Renal ROS  negative genitourinary   Musculoskeletal negative musculoskeletal ROS (+)   Abdominal   Peds negative pediatric ROS (+)  Hematology negative hematology ROS (+)   Anesthesia Other Findings   Reproductive/Obstetrics negative OB ROS                            Anesthesia Physical Anesthesia Plan  ASA: II  Anesthesia Plan: General   Post-op Pain Management:    Induction: Intravenous  PONV Risk Score and Plan: 3 and Ondansetron, Dexamethasone and Treatment may vary due to age or medical condition  Airway Management Planned: LMA  Additional Equipment:   Intra-op Plan:   Post-operative Plan: Extubation in OR  Informed Consent: I have reviewed the patients History and Physical, chart, labs and discussed the procedure including the risks, benefits and alternatives for the proposed anesthesia with the patient or authorized representative who has indicated his/her understanding and acceptance.   Dental advisory given  Plan Discussed with: CRNA and Surgeon  Anesthesia Plan Comments:         Anesthesia Quick Evaluation

## 2018-05-09 NOTE — Anesthesia Postprocedure Evaluation (Signed)
Anesthesia Post Note  Patient: [name]  Procedure(s) Performed: CYSTOSCOPY WITH RETROGRADE PYELOGRAM, URETEROSCOPY AND STENT PLACEMENT (Left Ureter) HOLMIUM LASER APPLICATION (Left Ureter)     Patient location during evaluation: PACU Anesthesia Type: General Level of consciousness: awake and alert Pain management: pain level controlled Vital Signs Assessment: post-procedure vital signs reviewed and stable Respiratory status: spontaneous breathing, nonlabored ventilation and respiratory function stable Cardiovascular status: blood pressure returned to baseline and stable Postop Assessment: no apparent nausea or vomiting Anesthetic complications: no    Last Vitals:  Vitals:   05/09/18 1515 05/09/18 1530  BP: (!) 155/84 (!) 154/77  Pulse: 84 85  Resp: 16 17  Temp:    SpO2: 98% 91%    Last Pain:  Vitals:   05/09/18 1520  TempSrc:   PainSc: [place]

## 2018-05-09 NOTE — H&P (Signed)
Urology Admission H&P  Chief Complaint: left flank pain  History of Present Illness: [name] is a 72yo with a hx of nephrolithiasis who presented to my over 1 week ago with new left flank pain. The pain is sharp, intermittent, severe, and nonradiaiting. No exacerbating/alleviating events. No other associated symptoms. She has multiple left proximal ureteral calculi  Past Medical History:  Diagnosis Date  . Abdominal hernia    per CT 04-28-2018  abdominal/ pelvic anterior wall hernia  . Arthritis   . CKD (chronic kidney disease), stage III ([place])   . Disorder of bone and cartilage, unspecified   . Essential hypertension, benign   . Full dentures   . GERD (gastroesophageal reflux disease)   . 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 History of kidney stones   . History of radiation therapy 09-10-2015 to [phone]   left breast 50Gy  . Iron deficiency anemia   . Malignant neoplasm of lower-inner quadrant of left breast in female, estrogen receptor positive [facility]) oncologist-  dr Mathis Dad higgs ([facility])   dx 08-16-2015--  Stage 1A invasisive (T1c,N0,M0), ER/PR+, HER2 negative---- 06-26-2015 left partial mastectomy , 07-31-2015 left axilla node dissection x1 (negative)-- completed radiation 10-08-2015 and started arimidex 10-16-2015  . Mixed hyperlipidemia   . Nephrolithiasis    bilateral per CT 04-28-2018  . Type 2 diabetes mellitus ([place])    followed by pcp  . Ureteral calculi    left  . Urgency of urination   . Wears glasses    Past Surgical History:  Procedure Laterality Date  . AXILLARY SENTINEL NODE BIOPSY Left 07/31/2015   Procedure: SENTINEL LYMPH NODE BIOPSY, LEFT AXILLA, ;  Surgeon: Aviva Signs, MD;  [facility];  Service: General;  Laterality: Left;  Sentinel Node @ 1000  . BREAST BIOPSY Left 06/09/2016   Procedure: LEFT BREAST BIOPSY AFTER NEEDLE LOCALIZATION;  Surgeon: Aviva Signs, MD;  [facility];  Service:  General;  Laterality: Left;  . CARPAL TUNNEL RELEASE Left 11/02/2014   Procedure: CARPAL TUNNEL RELEASE;  Surgeon: Charlotte Crumb, MD;  Location: [place];  Service: Orthopedics;  Laterality: Left;  . CATARACT EXTRACTION W/ INTRAOCULAR LENS  IMPLANT, BILATERAL  2007  . COLONOSCOPY N/A 02/03/2017   Procedure: COLONOSCOPY;  Surgeon: Danie Binder, MD;  [facility];  Service: Endoscopy;  Laterality: N/A;  1245  . COLONOSCOPY W/ BIOPSIES AND POLYPECTOMY    . CYSTOSCOPY/URETEROSCOPY/HOLMIUM LASER/STENT PLACEMENT Bilateral 03/18/2017   Procedure: CYSTOSCOPY/ BILATERAL RETROGRADE/RIGHT URETEROSCOPY/ RIGHT HOLMIUM LASER APPLICATION/RIGHT URETERAL STENT PLACEMENT;  Surgeon: Irine Seal, MD;  [facility];  Service: Urology;  Laterality: Bilateral;  . OPEN REDUCTION INTERNAL FIXATION (ORIF) DISTAL RADIAL FRACTURE Left 11/02/2014   Procedure: OPEN REDUCTION INTERNAL FIXATION (ORIF) DISTAL RADIAL FRACTURE;  Surgeon: Charlotte Crumb, MD;  Location: [place];  Service: Orthopedics;  Laterality: Left;  . PARTIAL MASTECTOMY WITH NEEDLE LOCALIZATION Left 06/26/2015   Procedure: PARTIAL MASTECTOMY WITH NEEDLE LOCALIZATION;  Surgeon: Aviva Signs, MD;  [facility];  Service: General;  Laterality: Left;  Needle Loc @ 8:00am  . POLYPECTOMY  02/03/2017   Procedure: POLYPECTOMY;  Surgeon: Danie Binder, MD;  [facility];  Service: Endoscopy;;  descending and hepatic flexure  . TARSAL METATARSAL ARTHRODESIS Left 09-07-2007   dr [facility]   first and second tarsal metatarsal fusion, gastroc slide, neurolysis  . TOTAL ABDOMINAL HYSTERECTOMY W/ BILATERAL SALPINGOOPHORECTOMY  10-21-2004  dr soper  [facility]    Home Medications:  Current Facility-Administered Medications  Medication Dose Route Frequency Provider Last Rate Last Dose  . 0.9 %  sodium chloride infusion   Intravenous Continuous Myrtie Soman, MD 50 mL/hr at 05/09/18 1152    . ceFAZolin (ANCEF) IVPB 2g/100 mL premix  2 g Intravenous  30 min Pre-Op Kamaury Cutbirth, Candee Furbish, MD       Allergies: No Known Allergies  Family History  Problem Relation Age of Onset  . Hypertension Mother   . Osteoporosis Mother   . Diabetes Sister        x3  . Colon cancer Neg Hx    Social History:  reports that she quit smoking about 31 years ago. Her smoking use included cigarettes. She has a 20.00 pack-year smoking history. She has never used smokeless tobacco. She reports that she does not drink alcohol or use drugs.  Review of Systems  Genitourinary: Positive for flank pain.  All other systems reviewed and are negative.   Physical Exam:  Vital signs in last 24 hours: Temp:  [97.6 F (36.4 C)] 97.6 F (36.4 C) (06/24 1047) Pulse Rate:  [88] 88 (06/24 1047) Resp:  [17] 17 (06/24 1047) BP: (142)/(72) 142/72 (06/24 1047) SpO2:  [97 %] 97 % (06/24 1047) Weight:  [77.7 kg (171 lb 6.4 oz)] 77.7 kg (171 lb 6.4 oz) (06/24 1047) Physical Exam  Constitutional: She is oriented to person, place, and time. She appears well-developed and well-nourished.  HENT:  Head: Normocephalic and atraumatic.  Eyes: Pupils are equal, round, and reactive to light. EOM are normal.  Neck: Normal range of motion. No thyromegaly present.  Cardiovascular: Normal rate and regular rhythm.  Respiratory: Effort normal. No respiratory distress.  GI: Soft. She exhibits no distension.  Musculoskeletal: Normal range of motion. She exhibits no edema.  Neurological: She is alert and oriented to person, place, and time.  Skin: Skin is warm and dry.  Psychiatric: She has a normal mood and affect. Her behavior is normal. Judgment and thought content normal.    Laboratory Data:  Results for orders placed or performed during the hospital encounter of 05/09/18 (from the past 24 hour(s))  Glucose, capillary     Status: None   Collection Time: 05/09/18 11:56 AM  Result Value Ref Range   Glucose-Capillary 90 65 - 99 mg/dL   No results found for this or any previous visit  (from the past 240 hour(s)). Creatinine: No results for input(s): CREATININE in the last 168 hours. Baseline Creatinine: unknown  Impression/Assessment:  72yo with multiple left ureteral calculi  Plan:  The risks/benefits/alternatives to left ureteroscopic stone extraction was explained to the patient and she understands and wishes to proceed with surgery  Nicolette Bang 05/09/2018, 12:28 PM

## 2018-05-10 ENCOUNTER — Ambulatory Visit (HOSPITAL_COMMUNITY)
Admission: RE | Admit: 2018-05-10 | Discharge: 2018-05-10 | Disposition: A | Discharge status: Home / Self Care | Payer: PPO | Source: Ambulatory Visit | Attending: Internal Medicine | Admitting: Internal Medicine

## 2018-05-10 ENCOUNTER — Other Ambulatory Visit (HOSPITAL_COMMUNITY): Payer: Self-pay | Admitting: Internal Medicine

## 2018-05-10 ENCOUNTER — Encounter (HOSPITAL_BASED_OUTPATIENT_CLINIC_OR_DEPARTMENT_OTHER): Payer: Self-pay | Admitting: Urology

## 2018-05-10 DIAGNOSIS — Z170 Estrogen receptor positive status [ER+]: Principal | ICD-10-CM

## 2018-05-10 DIAGNOSIS — C50312 Malignant neoplasm of lower-inner quadrant of left female breast: Secondary | ICD-10-CM

## 2018-05-10 DIAGNOSIS — R928 Other abnormal and inconclusive findings on diagnostic imaging of breast: Secondary | ICD-10-CM | POA: Diagnosis not present

## 2018-05-10 DIAGNOSIS — Z853 Personal history of malignant neoplasm of breast: Secondary | ICD-10-CM | POA: Diagnosis not present

## 2018-05-12 ENCOUNTER — Other Ambulatory Visit: Payer: Self-pay

## 2018-05-12 ENCOUNTER — Encounter (HOSPITAL_COMMUNITY): Payer: Self-pay | Admitting: Hematology

## 2018-05-12 ENCOUNTER — Inpatient Hospital Stay (HOSPITAL_COMMUNITY): Payer: PPO | Attending: Internal Medicine | Admitting: Hematology

## 2018-05-12 ENCOUNTER — Ambulatory Visit (HOSPITAL_COMMUNITY): Payer: PPO | Admitting: Internal Medicine

## 2018-05-12 VITALS — BP 141/65 | HR 87 | Temp 98.4°F | Resp 16 | Wt 173.3 lb

## 2018-05-12 DIAGNOSIS — Z87891 Personal history of nicotine dependence: Secondary | ICD-10-CM | POA: Diagnosis not present

## 2018-05-12 DIAGNOSIS — Z87442 Personal history of urinary calculi: Secondary | ICD-10-CM | POA: Diagnosis not present

## 2018-05-12 DIAGNOSIS — I129 Hypertensive chronic kidney disease with stage 1 through stage 4 chronic kidney disease, or unspecified chronic kidney disease: Secondary | ICD-10-CM | POA: Diagnosis not present

## 2018-05-12 DIAGNOSIS — Z79899 Other long term (current) drug therapy: Secondary | ICD-10-CM

## 2018-05-12 DIAGNOSIS — E1122 Type 2 diabetes mellitus with diabetic chronic kidney disease: Secondary | ICD-10-CM | POA: Insufficient documentation

## 2018-05-12 DIAGNOSIS — Z79811 Long term (current) use of aromatase inhibitors: Secondary | ICD-10-CM | POA: Insufficient documentation

## 2018-05-12 DIAGNOSIS — Z7982 Long term (current) use of aspirin: Secondary | ICD-10-CM | POA: Insufficient documentation

## 2018-05-12 DIAGNOSIS — N183 Chronic kidney disease, stage 3 (moderate): Secondary | ICD-10-CM | POA: Diagnosis not present

## 2018-05-12 DIAGNOSIS — E782 Mixed hyperlipidemia: Secondary | ICD-10-CM | POA: Diagnosis not present

## 2018-05-12 DIAGNOSIS — Z923 Personal history of irradiation: Secondary | ICD-10-CM | POA: Insufficient documentation

## 2018-05-12 DIAGNOSIS — Z7984 Long term (current) use of oral hypoglycemic drugs: Secondary | ICD-10-CM | POA: Diagnosis not present

## 2018-05-12 DIAGNOSIS — K219 Gastro-esophageal reflux disease without esophagitis: Secondary | ICD-10-CM | POA: Diagnosis not present

## 2018-05-12 DIAGNOSIS — Z170 Estrogen receptor positive status [ER+]: Secondary | ICD-10-CM | POA: Insufficient documentation

## 2018-05-12 DIAGNOSIS — C50312 Malignant neoplasm of lower-inner quadrant of left female breast: Secondary | ICD-10-CM | POA: Diagnosis not present

## 2018-05-12 MED ORDER — ANASTROZOLE 1 MG PO TABS: 1.0000 mg | ORAL_TABLET | Freq: Every day | ORAL | 1 refills | Status: DC

## 2018-05-12 NOTE — Assessment & Plan Note (Addendum)
1.  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 -Last DEXA scan in October 2017 with a T score of -0.2 in the left femoral neck, normal density.  She already has a follow-up DEXA scan ordered in October 2019. -She will continue calcium and vitamin D twice daily.  She was also counseled to exercise on a regular basis.  We will see her back in 6 months with repeat labs.

## 2018-05-12 NOTE — Patient Instructions (Signed)
Follow up in 6 months.  Please have labs done one day prior to office visit.

## 2018-05-12 NOTE — Progress Notes (Signed)
[place], [place] [address]   CLINIC:  Medical Oncology/Hematology  PCP:  Christain Sacramento, MD [address] [place] [address] [phone]   REASON FOR VISIT:  Follow-up for left breast cancer.  CURRENT THERAPY: Anastrozole 1 mg daily.  BRIEF ONCOLOGIC HISTORY:    Breast cancer ([place]) (Resolved)   08/16/2015 Initial Diagnosis    Breast cancer ([place])      09/11/2015 -  Radiation Therapy    Dr. Isidore Moos       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 ([place])   04/18/2015 Mammogram    Calcifications in the left breast      04/24/2015 Mammogram    BI-RADS Category 4 with indeterminate left breast calcification, 1 x 4.2 x 1.2 cm developing group of heterogeneous calcifications      05/02/2015 Pathology Results    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 ER+ 100%, PR+ 80%      05/02/2015 Procedure    Left breast stereotactic core needle biopsy      06/26/2015 Oncotype testing    ONCOTYPE DX assay with recurrence score result of 5, Low risk, 10 year risk of distance recurrence tamoxifen alone 5%      06/26/2015 Definitive Surgery    Breast partial mastectomy after needle localization with Dr. Arnoldo Morale no sentinel node performed      06/26/2015 Pathology Results    INVASIVE DUCTAL CARCINOMA, 2 CM. - HIGH GRADE DUCTAL CARCINOMA IN SITU. - INVASIVE CARCINOMA FOCALLY LESS THAN 0.1 CM FROM LATERAL MARGIN. - DUCTAL CARCINOMA IN SITU FOCALLY LESS THAN 0.1 CM FROM THE LATERAL MARGIN.      06/26/2015 Pathology Results    ER+ 100%, PR + 60%, Ki-67 25%, HER2 NEGATIVE      07/31/2015 Procedure    Sentinel node procedure      07/31/2015 Pathology Results    ONE BENIGN LYMPH NODE (0/1).      08/26/2015 Imaging    Bone density- Normal      09/10/2015 - 10/08/2015 Radiation Therapy    50 Gy left breast by Dr. Isidore Moos.      10/16/2015 -  Anti-estrogen oral therapy    Arimidex      05/08/2016 Procedure     Stereotactic-guided biopsies of the calcifications of left breast.      05/08/2016 Pathology Results    Breast, left, needle core biopsy, lateral and ant to lumpectomy site FIBROCYSTIC CHANGES VASCULAR CALCIFICATIONS NO EVIDENCE OF MALIGNANCY      05/08/2016 Pathology Results    2. Breast, left, needle core biopsy, lumpectomy site FAT NECROSIS WITH FIBROSIS AND MICROCALCIFICATIONS NO EVIDENCE OF MALIGNANCY      06/09/2016 Surgery    Left breast biopsy for microcalcifications. Path: benign.        CANCER STAGING: Cancer Staging 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 [facility]) Staging form: Breast, AJCC 7th Edition - Clinical stage from 11/19/2015: Stage IA (T1c, N0, M0) - Signed by Baird Cancer, PA-C on 11/19/2015    INTERVAL HISTORY:  Ms. Youkhana 72 y.o. female returns for follow-up of her left breast cancer.  She recently had mammogram done.  Denies any new onset pains.  No breast lumps were palpable.  She takes Tums and vitamin D.  Denies any fevers, night sweats or weight loss.  Denies any recent hospitalizations.  Requests refill of Arimidex.    REVIEW OF SYSTEMS:  Review of Systems  All  other systems reviewed and are negative.    PAST MEDICAL/SURGICAL HISTORY:  Past Medical History:  Diagnosis Date  . Abdominal hernia    per CT 04-28-2018  abdominal/ pelvic anterior wall hernia  . Arthritis   . CKD (chronic kidney disease), stage III ([place])   . Disorder of bone and cartilage, unspecified   . Essential hypertension, benign   . Full dentures   . GERD (gastroesophageal reflux disease)   . 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 History of kidney stones   . History of radiation therapy 09-10-2015 to [phone]   left breast 50Gy  . Iron deficiency anemia   . Malignant neoplasm of lower-inner quadrant of left breast in female, estrogen receptor positive [facility]) oncologist-  dr Mathis Dad higgs ([facility])    dx 08-16-2015--  Stage 1A invasisive (T1c,N0,M0), ER/PR+, HER2 negative---- 06-26-2015 left partial mastectomy , 07-31-2015 left axilla node dissection x1 (negative)-- completed radiation 10-08-2015 and started arimidex 10-16-2015  . Mixed hyperlipidemia   . Nephrolithiasis    bilateral per CT 04-28-2018  . Type 2 diabetes mellitus ([place])    followed by pcp  . Ureteral calculi    left  . Urgency of urination   . Wears glasses    Past Surgical History:  Procedure Laterality Date  . AXILLARY SENTINEL NODE BIOPSY Left 07/31/2015   Procedure: SENTINEL LYMPH NODE BIOPSY, LEFT AXILLA, ;  Surgeon: Aviva Signs, MD;  [facility];  Service: General;  Laterality: Left;  Sentinel Node @ 1000  . BREAST BIOPSY Left 06/09/2016   Procedure: LEFT BREAST BIOPSY AFTER NEEDLE LOCALIZATION;  Surgeon: Aviva Signs, MD;  [facility];  Service: General;  Laterality: Left;  . CARPAL TUNNEL RELEASE Left 11/02/2014   Procedure: CARPAL TUNNEL RELEASE;  Surgeon: Charlotte Crumb, MD;  Location: [place];  Service: Orthopedics;  Laterality: Left;  . CATARACT EXTRACTION W/ INTRAOCULAR LENS  IMPLANT, BILATERAL  2007  . COLONOSCOPY N/A 02/03/2017   Procedure: COLONOSCOPY;  Surgeon: Danie Binder, MD;  [facility];  Service: Endoscopy;  Laterality: N/A;  1245  . COLONOSCOPY W/ BIOPSIES AND POLYPECTOMY    . CYSTOSCOPY WITH RETROGRADE PYELOGRAM, URETEROSCOPY AND STENT PLACEMENT Left 05/09/2018   Procedure: CYSTOSCOPY WITH RETROGRADE PYELOGRAM, URETEROSCOPY AND STENT PLACEMENT;  Surgeon: Cleon Gustin, MD;  Location[facility];  Service: Urology;  Laterality: Left;  . CYSTOSCOPY/URETEROSCOPY/HOLMIUM LASER/STENT PLACEMENT Bilateral 03/18/2017   Procedure: CYSTOSCOPY/ BILATERAL RETROGRADE/RIGHT URETEROSCOPY/ RIGHT HOLMIUM LASER APPLICATION/RIGHT URETERAL STENT PLACEMENT;  Surgeon: Irine Seal, MD;  [facility];  Service: Urology;  Laterality: Bilateral;  . HOLMIUM LASER APPLICATION  Left 02/22/8118   Procedure: HOLMIUM LASER APPLICATION;  Surgeon: Cleon Gustin, MD;  Location: [facility];  Service: Urology;  Laterality: Left;  . OPEN REDUCTION INTERNAL FIXATION (ORIF) DISTAL RADIAL FRACTURE Left 11/02/2014   Procedure: OPEN REDUCTION INTERNAL FIXATION (ORIF) DISTAL RADIAL FRACTURE;  Surgeon: Charlotte Crumb, MD;  Location: [place];  Service: Orthopedics;  Laterality: Left;  . PARTIAL MASTECTOMY WITH NEEDLE LOCALIZATION Left 06/26/2015   Procedure: PARTIAL MASTECTOMY WITH NEEDLE LOCALIZATION;  Surgeon: Aviva Signs, MD;  [facility];  Service: General;  Laterality: Left;  Needle Loc @ 8:00am  . POLYPECTOMY  02/03/2017   Procedure: POLYPECTOMY;  Surgeon: Danie Binder, MD;  [facility];  Service: Endoscopy;;  descending and hepatic flexure  . TARSAL METATARSAL ARTHRODESIS Left 09-07-2007  dr Beola Cord  [facility]   first and second tarsal metatarsal fusion, gastroc slide, neurolysis  . TOTAL ABDOMINAL HYSTERECTOMY W/ BILATERAL SALPINGOOPHORECTOMY  10-21-2004   dr soper  [facility]     SOCIAL HISTORY:  Social History   Socioeconomic History  . Marital status: Married    Spouse name: Not on file  . Number of children: 2  . Years of education: Not on file  . Highest education level: Not on file  Occupational History  . Occupation: Retired  Scientific laboratory technician  . Financial resource strain: Not on file  . Food insecurity:    Worry: Not on file    Inability: Not on file  . Transportation needs:    Medical: Not on file    Non-medical: Not on file  Tobacco Use  . Smoking status: Former Smoker    Packs/day: 1.00    Years: 20.00    Pack years: 20.00    Types: Cigarettes    Last attempt to quit: 06/05/1986    Years since quitting: 31.9  . Smokeless tobacco: Never Used  Substance and Sexual Activity  . Alcohol use: No  . Drug use: No  . Sexual activity: Never    Birth control/protection: Surgical  Lifestyle  . Physical activity:    Days per  week: Not on file    Minutes per session: Not on file  . Stress: Not on file  Relationships  . Social connections:    Talks on phone: Not on file    Gets together: Not on file    Attends religious service: Not on file    Active member of club or organization: Not on file    Attends meetings of clubs or organizations: Not on file    Relationship status: Not on file  . Intimate partner violence:    Fear of current or ex partner: Not on file    Emotionally abused: Not on file    Physically abused: Not on file    Forced sexual activity: Not on file  Other Topics Concern  . Not on file  Social History Narrative  . Not on file    FAMILY HISTORY:  Family History  Problem Relation Age of Onset  . Hypertension Mother   . Osteoporosis Mother   . Diabetes Sister        x3  . Colon cancer Neg Hx     CURRENT MEDICATIONS:  Outpatient Encounter Medications as of 05/12/2018  Medication Sig  . acetaminophen (TYLENOL 8 HOUR ARTHRITIS PAIN) 650 MG CR tablet Take 650 mg by mouth every 8 (eight) hours as needed for pain.  Marland Kitchen anastrozole (ARIMIDEX) 1 MG tablet Take 1 tablet (1 mg total) by mouth daily.  Marland Kitchen aspirin EC 81 MG tablet Take 81 mg by mouth every other day.  . Cholecalciferol (VITAMIN D3) 1000 units CAPS Take 1,000 Units by mouth at bedtime.  . fenofibrate 160 MG tablet Take 160 mg by mouth every evening.   . Ferrous Sulfate (IRON) 325 (65 Fe) MG TABS Take by mouth every morning.  Marland Kitchen losartan (COZAAR) 100 MG tablet Take 100 mg by mouth every morning.  . Magnesium 400 MG TABS Take 1 tablet by mouth every morning.   . metFORMIN (GLUCOPHAGE) 1000 MG tablet Take 1,000 mg by mouth 2 (two) times daily with a meal.   . Multiple Vitamin (MULTIVITAMIN WITH MINERALS) TABS tablet Take 1 tablet by mouth every morning. Women's One A Day 50+   . Omega-3 Fatty Acids (FISH OIL)  1200 MG CAPS Take 1,200 mg by mouth every morning.   Marland Kitchen omeprazole (PRILOSEC) 20 MG capsule Take 20 mg by mouth every morning.     Marland Kitchen oxyCODONE-acetaminophen (PERCOCET/ROXICET) 5-325 MG tablet Take 1 tablet by mouth every 4 (four) hours as needed for severe pain.  . simvastatin (ZOCOR) 40 MG tablet Take 40 mg by mouth at bedtime.   . tamsulosin (FLOMAX) 0.4 MG CAPS capsule Take 1 capsule (0.4 mg total) by mouth daily after supper.  . [DISCONTINUED] anastrozole (ARIMIDEX) 1 MG tablet Take 1 tablet (1 mg total) by mouth daily. (Patient taking differently: Take 1 mg by mouth every morning. )   No facility-administered encounter medications on file as of 05/12/2018.     ALLERGIES:  No Known Allergies   PHYSICAL EXAM:  ECOG Performance status: 1  Vitals:   05/12/18 1347  BP: (!) 141/65  Pulse: 87  Resp: 16  Temp: 98.4 F (36.9 C)  SpO2: 97%   Filed Weights   05/12/18 1347  Weight: 173 lb 4.8 oz (78.6 kg)    Physical Exam Deferred.  LABORATORY DATA:  I have reviewed the labs as listed.  CBC    Component Value Date/Time   WBC 6.6 04/28/2018 1104   RBC 4.38 04/28/2018 1104   HGB 12.7 04/28/2018 1104   HCT 39.3 04/28/2018 1104   PLT 219 04/28/2018 1104   MCV 89.7 04/28/2018 1104   MCH 29.0 04/28/2018 1104   MCHC 32.3 04/28/2018 1104   RDW 14.2 04/28/2018 1104   LYMPHSABS 0.9 04/28/2018 1104   MONOABS 0.5 04/28/2018 1104   EOSABS 0.1 04/28/2018 1104   BASOSABS 0.0 04/28/2018 1104   CMP Latest Ref Rng & Units 04/28/2018 01/25/2018 08/04/2017  Glucose 65 - 99 mg/dL 111(H) 108(H) 139(H)  BUN 6 - 20 mg/dL 34(H) 30(H) 24(H)  Creatinine 0.44 - 1.00 mg/dL 1.49(H) 1.13(H) 1.16(H)  Sodium 135 - 145 mmol/L 139 138 136  Potassium 3.5 - 5.1 mmol/L 4.6 4.0 3.9  Chloride 101 - 111 mmol/L 102 101 101  CO2 22 - 32 mmol/L _0 Calcium 8.9 - 10.3 mg/dL 10.1 10.4(H) 10.0  Total Protein 6.5 - 8.1 g/dL - 7.3 7.4  Total Bilirubin 0.3 - 1.2 mg/dL - 0.7 0.4  Alkaline Phos 38 - 126 U/L - 55 44  AST 15 - 41 U/L - 29 37  ALT 14 - 54 U/L - 25 36       DIAGNOSTIC IMAGING:  I have independently reviewed the  mammogram images dated 05/10/2018 and agree with report.     ASSESSMENT & PLAN:   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 ([place]) 1.  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 -Last DEXA scan in October 2017 with a T score of -0.2 in the left femoral neck, normal density.  She already has a follow-up DEXA scan ordered in October 2019. -She will continue calcium and vitamin D twice daily.  She was also counseled to exercise on a regular basis.  We will see her back in 6 months with repeat labs.      Orders placed this encounter:  Orders Placed This Encounter  Procedures  . CBC with Differential/Platelet  . Comprehensive metabolic panel      Derek Jack, MD [facility] [phone]  

## 2018-05-16 DIAGNOSIS — N201 Calculus of ureter: Secondary | ICD-10-CM | POA: Diagnosis not present

## 2018-06-29 DIAGNOSIS — N200 Calculus of kidney: Secondary | ICD-10-CM | POA: Diagnosis not present

## 2018-07-05 ENCOUNTER — Other Ambulatory Visit (HOSPITAL_COMMUNITY): Payer: Self-pay | Admitting: *Deleted

## 2018-07-05 DIAGNOSIS — C50312 Malignant neoplasm of lower-inner quadrant of left female breast: Secondary | ICD-10-CM

## 2018-07-05 DIAGNOSIS — N200 Calculus of kidney: Secondary | ICD-10-CM | POA: Diagnosis not present

## 2018-07-05 DIAGNOSIS — Z170 Estrogen receptor positive status [ER+]: Principal | ICD-10-CM

## 2018-07-05 MED ORDER — ANASTROZOLE 1 MG PO TABS: 1.0000 mg | ORAL_TABLET | Freq: Every day | ORAL | 3 refills | Status: DC

## 2018-07-27 DIAGNOSIS — K219 Gastro-esophageal reflux disease without esophagitis: Secondary | ICD-10-CM | POA: Diagnosis not present

## 2018-07-27 DIAGNOSIS — E1122 Type 2 diabetes mellitus with diabetic chronic kidney disease: Secondary | ICD-10-CM | POA: Diagnosis not present

## 2018-07-27 DIAGNOSIS — Z7984 Long term (current) use of oral hypoglycemic drugs: Secondary | ICD-10-CM | POA: Diagnosis not present

## 2018-07-27 DIAGNOSIS — I129 Hypertensive chronic kidney disease with stage 1 through stage 4 chronic kidney disease, or unspecified chronic kidney disease: Secondary | ICD-10-CM | POA: Diagnosis not present

## 2018-07-27 DIAGNOSIS — N183 Chronic kidney disease, stage 3 (moderate): Secondary | ICD-10-CM | POA: Diagnosis not present

## 2018-07-27 DIAGNOSIS — Z23 Encounter for immunization: Secondary | ICD-10-CM | POA: Diagnosis not present

## 2018-07-27 DIAGNOSIS — E782 Mixed hyperlipidemia: Secondary | ICD-10-CM | POA: Diagnosis not present

## 2018-08-03 DIAGNOSIS — N200 Calculus of kidney: Secondary | ICD-10-CM | POA: Diagnosis not present

## 2018-08-30 DIAGNOSIS — N183 Chronic kidney disease, stage 3 (moderate): Secondary | ICD-10-CM | POA: Diagnosis not present

## 2018-09-08 ENCOUNTER — Ambulatory Visit (HOSPITAL_COMMUNITY)
Admission: RE | Admit: 2018-09-08 | Discharge: 2018-09-08 | Disposition: A | Discharge status: Home / Self Care | Payer: PPO | Source: Ambulatory Visit | Attending: Internal Medicine | Admitting: Internal Medicine

## 2018-09-08 DIAGNOSIS — Z170 Estrogen receptor positive status [ER+]: Secondary | ICD-10-CM | POA: Diagnosis not present

## 2018-09-08 DIAGNOSIS — Z780 Asymptomatic menopausal state: Secondary | ICD-10-CM | POA: Insufficient documentation

## 2018-09-08 DIAGNOSIS — E2839 Other primary ovarian failure: Secondary | ICD-10-CM | POA: Diagnosis not present

## 2018-09-08 DIAGNOSIS — C50312 Malignant neoplasm of lower-inner quadrant of left female breast: Secondary | ICD-10-CM | POA: Insufficient documentation

## 2018-10-26 DIAGNOSIS — N200 Calculus of kidney: Secondary | ICD-10-CM | POA: Diagnosis not present

## 2018-11-17 ENCOUNTER — Inpatient Hospital Stay (HOSPITAL_COMMUNITY): Payer: PPO | Attending: Hematology

## 2018-11-17 DIAGNOSIS — I129 Hypertensive chronic kidney disease with stage 1 through stage 4 chronic kidney disease, or unspecified chronic kidney disease: Secondary | ICD-10-CM | POA: Diagnosis not present

## 2018-11-17 DIAGNOSIS — Z923 Personal history of irradiation: Secondary | ICD-10-CM | POA: Diagnosis not present

## 2018-11-17 DIAGNOSIS — M8580 Other specified disorders of bone density and structure, unspecified site: Secondary | ICD-10-CM | POA: Diagnosis not present

## 2018-11-17 DIAGNOSIS — Z87891 Personal history of nicotine dependence: Secondary | ICD-10-CM | POA: Insufficient documentation

## 2018-11-17 DIAGNOSIS — Z170 Estrogen receptor positive status [ER+]: Secondary | ICD-10-CM | POA: Diagnosis not present

## 2018-11-17 DIAGNOSIS — K219 Gastro-esophageal reflux disease without esophagitis: Secondary | ICD-10-CM | POA: Diagnosis not present

## 2018-11-17 DIAGNOSIS — E1122 Type 2 diabetes mellitus with diabetic chronic kidney disease: Secondary | ICD-10-CM | POA: Insufficient documentation

## 2018-11-17 DIAGNOSIS — E785 Hyperlipidemia, unspecified: Secondary | ICD-10-CM | POA: Insufficient documentation

## 2018-11-17 DIAGNOSIS — N183 Chronic kidney disease, stage 3 (moderate): Secondary | ICD-10-CM | POA: Insufficient documentation

## 2018-11-17 DIAGNOSIS — R600 Localized edema: Secondary | ICD-10-CM | POA: Diagnosis not present

## 2018-11-17 DIAGNOSIS — C50312 Malignant neoplasm of lower-inner quadrant of left female breast: Secondary | ICD-10-CM | POA: Diagnosis not present

## 2018-11-17 DIAGNOSIS — R5383 Other fatigue: Secondary | ICD-10-CM | POA: Insufficient documentation

## 2018-11-17 DIAGNOSIS — R51 Headache: Secondary | ICD-10-CM | POA: Insufficient documentation

## 2018-11-17 DIAGNOSIS — M1990 Unspecified osteoarthritis, unspecified site: Secondary | ICD-10-CM | POA: Diagnosis not present

## 2018-11-17 DIAGNOSIS — Z79811 Long term (current) use of aromatase inhibitors: Secondary | ICD-10-CM | POA: Diagnosis not present

## 2018-11-17 DIAGNOSIS — Z7984 Long term (current) use of oral hypoglycemic drugs: Secondary | ICD-10-CM | POA: Insufficient documentation

## 2018-11-17 DIAGNOSIS — Z79899 Other long term (current) drug therapy: Secondary | ICD-10-CM | POA: Insufficient documentation

## 2018-11-17 DIAGNOSIS — Z7982 Long term (current) use of aspirin: Secondary | ICD-10-CM | POA: Diagnosis not present

## 2018-11-17 LAB — CBC WITH DIFFERENTIAL/PLATELET
Abs Immature Granulocytes: 0.01 10*3/uL (ref 0.00–0.07)
BASOS PCT: 0 %
Basophils Absolute: 0 10*3/uL (ref 0.0–0.1)
EOS ABS: 0.1 10*3/uL (ref 0.0–0.5)
EOS PCT: 2 %
HCT: 40.3 % (ref 36.0–46.0)
Hemoglobin: 12.6 g/dL (ref 12.0–15.0)
Immature Granulocytes: 0 %
Lymphocytes Relative: 22 %
Lymphs Abs: 1.4 10*3/uL (ref 0.7–4.0)
MCH: 28.9 pg (ref 26.0–34.0)
MCHC: 31.3 g/dL (ref 30.0–36.0)
MCV: 92.4 fL (ref 80.0–100.0)
MONO ABS: 0.6 10*3/uL (ref 0.1–1.0)
MONOS PCT: 9 %
Neutro Abs: 4.4 10*3/uL (ref 1.7–7.7)
Neutrophils Relative %: 67 %
Platelets: 237 10*3/uL (ref 150–400)
RBC: 4.36 MIL/uL (ref 3.87–5.11)
RDW: 14.3 % (ref 11.5–15.5)
WBC: 6.5 10*3/uL (ref 4.0–10.5)
nRBC: 0 % (ref 0.0–0.2)

## 2018-11-17 LAB — COMPREHENSIVE METABOLIC PANEL
ALT: 21 U/L (ref 0–44)
AST: 24 U/L (ref 15–41)
Albumin: 4 g/dL (ref 3.5–5.0)
Alkaline Phosphatase: 67 U/L (ref 38–126)
Anion gap: 9 (ref 5–15)
BILIRUBIN TOTAL: 0.3 mg/dL (ref 0.3–1.2)
BUN: 28 mg/dL — AB (ref 8–23)
CO2: 26 mmol/L (ref 22–32)
Calcium: 10 mg/dL (ref 8.9–10.3)
Chloride: 104 mmol/L (ref 98–111)
Creatinine, Ser: 1.04 mg/dL — ABNORMAL HIGH (ref 0.44–1.00)
GFR, EST NON AFRICAN AMERICAN: 53 mL/min — AB (ref >60)
Glucose, Bld: 113 mg/dL — ABNORMAL HIGH (ref 70–99)
POTASSIUM: 4.5 mmol/L (ref 3.5–5.1)
Sodium: 139 mmol/L (ref 135–145)
TOTAL PROTEIN: 6.9 g/dL (ref 6.5–8.1)

## 2018-11-24 ENCOUNTER — Other Ambulatory Visit: Payer: Self-pay

## 2018-11-24 ENCOUNTER — Inpatient Hospital Stay (HOSPITAL_COMMUNITY): Payer: PPO | Admitting: Hematology

## 2018-11-24 ENCOUNTER — Ambulatory Visit (HOSPITAL_COMMUNITY): Payer: PPO | Admitting: Hematology

## 2018-11-24 ENCOUNTER — Encounter (HOSPITAL_COMMUNITY): Payer: Self-pay | Admitting: Hematology

## 2018-11-24 VITALS — BP 151/61 | HR 81 | Temp 97.9°F | Ht 64.5 in | Wt 179.3 lb

## 2018-11-24 DIAGNOSIS — C50312 Malignant neoplasm of lower-inner quadrant of left female breast: Secondary | ICD-10-CM

## 2018-11-24 DIAGNOSIS — Z170 Estrogen receptor positive status [ER+]: Secondary | ICD-10-CM

## 2018-11-24 DIAGNOSIS — E785 Hyperlipidemia, unspecified: Secondary | ICD-10-CM | POA: Diagnosis not present

## 2018-11-24 DIAGNOSIS — R51 Headache: Secondary | ICD-10-CM

## 2018-11-24 DIAGNOSIS — Z79899 Other long term (current) drug therapy: Secondary | ICD-10-CM

## 2018-11-24 DIAGNOSIS — Z7982 Long term (current) use of aspirin: Secondary | ICD-10-CM

## 2018-11-24 DIAGNOSIS — Z7984 Long term (current) use of oral hypoglycemic drugs: Secondary | ICD-10-CM

## 2018-11-24 DIAGNOSIS — K219 Gastro-esophageal reflux disease without esophagitis: Secondary | ICD-10-CM

## 2018-11-24 DIAGNOSIS — N183 Chronic kidney disease, stage 3 (moderate): Secondary | ICD-10-CM | POA: Diagnosis not present

## 2018-11-24 DIAGNOSIS — Z79811 Long term (current) use of aromatase inhibitors: Secondary | ICD-10-CM | POA: Diagnosis not present

## 2018-11-24 DIAGNOSIS — E1122 Type 2 diabetes mellitus with diabetic chronic kidney disease: Secondary | ICD-10-CM | POA: Diagnosis not present

## 2018-11-24 DIAGNOSIS — R5383 Other fatigue: Secondary | ICD-10-CM | POA: Diagnosis not present

## 2018-11-24 DIAGNOSIS — M8580 Other specified disorders of bone density and structure, unspecified site: Secondary | ICD-10-CM

## 2018-11-24 DIAGNOSIS — Z923 Personal history of irradiation: Secondary | ICD-10-CM | POA: Diagnosis not present

## 2018-11-24 DIAGNOSIS — Z87891 Personal history of nicotine dependence: Secondary | ICD-10-CM

## 2018-11-24 DIAGNOSIS — R600 Localized edema: Secondary | ICD-10-CM

## 2018-11-24 DIAGNOSIS — M1990 Unspecified osteoarthritis, unspecified site: Secondary | ICD-10-CM

## 2018-11-24 DIAGNOSIS — I129 Hypertensive chronic kidney disease with stage 1 through stage 4 chronic kidney disease, or unspecified chronic kidney disease: Secondary | ICD-10-CM | POA: Diagnosis not present

## 2018-11-24 NOTE — Assessment & Plan Note (Signed)
1.  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   -Today's physical examination shows lumpectomy scar in the undersurface of the left breast unchanged.  No palpable masses in bilateral breast.  No palpable adenopathy.  Her blood work was within normal limits. -She will come back in 6 months for follow-up and physical exam. -We will also schedule her for next mammogram in June.  2.  Osteopenia: -DEXA scan in October 2017 shows a T score of -0.2. -We reviewed the results of the DEXA scan from 09/08/2018 which shows a T score of -0.4. -She is taking Tums once a day.  I have told her to increase it to twice a day.

## 2018-11-24 NOTE — Progress Notes (Signed)
Pt presents today for appt with Dr. Delton Coombes. VSS. Pt denies any pain or changes since the last visit. MAR updated and reviewed with pt. Pt requests refill on Arimidex.

## 2018-11-24 NOTE — Progress Notes (Signed)
[name], [place] [address]   CLINIC:  Medical Oncology/Hematology  PCP:  Christain Sacramento, MD [address] [place] [address] [phone]   REASON FOR VISIT: Follow-up for left breast cancer.  CURRENT THERAPY: Anastrozole 1 mg daily.  BRIEF ONCOLOGIC HISTORY:    Breast cancer ([place]) (Resolved)   08/16/2015 Initial Diagnosis    Breast cancer ([place])    09/11/2015 -  Radiation Therapy    Dr. 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 ER+ 100%, PR+ 80%    05/02/2015 Procedure    Left breast stereotactic core needle biopsy    06/26/2015 Oncotype testing    ONCOTYPE DX assay with recurrence score result of 5, Low risk, 10 year risk of distance recurrence tamoxifen alone 5%    06/26/2015 Definitive Surgery    Breast partial mastectomy after needle localization with Dr. Arnoldo Morale no sentinel node performed    06/26/2015 Pathology Results    INVASIVE DUCTAL CARCINOMA, 2 CM. - HIGH GRADE DUCTAL CARCINOMA IN SITU. - INVASIVE CARCINOMA FOCALLY LESS THAN 0.1 CM FROM LATERAL MARGIN. - DUCTAL CARCINOMA IN SITU FOCALLY LESS THAN 0.1 CM FROM THE LATERAL MARGIN.    06/26/2015 Pathology Results    ER+ 100%, PR + 60%, Ki-67 25%, HER2 NEGATIVE    07/31/2015 Procedure    Sentinel node procedure    07/31/2015 Pathology Results    ONE BENIGN LYMPH NODE (0/1).    08/26/2015 Imaging    Bone density- Normal    09/10/2015 - 10/08/2015 Radiation Therapy    50 Gy left breast by Dr. Isidore Moos.    10/16/2015 -  Anti-estrogen oral therapy    Arimidex    05/08/2016 Procedure    Stereotactic-guided biopsies of the  calcifications of left breast.    05/08/2016 Pathology Results    Breast, left, needle core biopsy, lateral and ant to lumpectomy site FIBROCYSTIC CHANGES VASCULAR CALCIFICATIONS NO EVIDENCE OF MALIGNANCY    05/08/2016 Pathology Results    2. Breast, left, needle core biopsy, lumpectomy site FAT NECROSIS WITH FIBROSIS AND MICROCALCIFICATIONS NO EVIDENCE OF MALIGNANCY    06/09/2016 Surgery    Left breast biopsy for microcalcifications. Path: benign.      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 female returns for routine follow-up for left breast cancer. She does have some fatigue as the evening comes. She reports her right leg swells during the day. She also has occasional headaches. Other than that she is doing well and has no complaints. She reports she is taking her vitamin D and calcium only once a day and understands its importance in taking it twice a day and will start. She also understand the importance of getting light exercise daily. Denies any nausea, vomiting, or diarrhea. Denies any new pains. Had not noticed any recent bleeding such as epistaxis, hematuria or hematochezia. Denies recent  chest pain on exertion, shortness of breath on minimal exertion, pre-syncopal episodes, or palpitations. Denies any numbness or tingling in hands or feet. Denies any recent fevers, infections, or recent hospitalizations. She reports her appetite at 100% and her energy level at 50%. She is maintaining her weight at this time.      REVIEW OF SYSTEMS:  Review of Systems  Constitutional: Positive for fatigue.  Cardiovascular: Positive for leg swelling.  Neurological: Positive for headaches.  All other systems reviewed and are negative.    PAST  MEDICAL/SURGICAL HISTORY:  Past Medical History:  Diagnosis Date  . Abdominal hernia    per CT 04-28-2018  abdominal/ pelvic anterior wall hernia  . Arthritis   . CKD (chronic kidney disease), stage III ([place])   . Disorder of bone and cartilage, unspecified   . Essential hypertension, benign   . Full dentures   . GERD (gastroesophageal reflux disease)   . 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 History of kidney stones   . History of radiation therapy 09-10-2015 to [phone]   left breast 50Gy  . Iron deficiency anemia   . Malignant neoplasm of lower-inner quadrant of left breast in female, estrogen receptor positive [facility]) oncologist-  dr Mathis Dad higgs ([facility])   dx 08-16-2015--  Stage 1A invasisive (T1c,N0,M0), ER/PR+, HER2 negative---- 06-26-2015 left partial mastectomy , 07-31-2015 left axilla node dissection x1 (negative)-- completed radiation 10-08-2015 and started arimidex 10-16-2015  . Mixed hyperlipidemia   . Nephrolithiasis    bilateral per CT 04-28-2018  . Type 2 diabetes mellitus ([place])    followed by pcp  . Ureteral calculi    left  . Urgency of urination   . Wears glasses    Past Surgical History:  Procedure Laterality Date  . AXILLARY SENTINEL NODE BIOPSY Left 07/31/2015   Procedure: SENTINEL LYMPH NODE BIOPSY, LEFT AXILLA, ;  Surgeon: Aviva Signs, MD;  Location: [facility];  Service: General;  Laterality: Left;  Sentinel Node @ 1000  . BREAST BIOPSY Left 06/09/2016   Procedure: LEFT BREAST BIOPSY AFTER NEEDLE LOCALIZATION;  Surgeon: Aviva Signs, MD;  Location: [facility];  Service: General;  Laterality: Left;  . CARPAL TUNNEL RELEASE Left 11/02/2014   Procedure: CARPAL TUNNEL RELEASE;  Surgeon: Charlotte Crumb, MD;  Location: [place];  Service: Orthopedics;  Laterality: Left;  . CATARACT EXTRACTION W/ INTRAOCULAR LENS  IMPLANT, BILATERAL  2007  . COLONOSCOPY N/A 02/03/2017   Procedure: COLONOSCOPY;  Surgeon: Danie Binder,  MD;  [facility];  Service: Endoscopy;  Laterality: N/A;  1245  . COLONOSCOPY W/ BIOPSIES AND POLYPECTOMY    . CYSTOSCOPY WITH RETROGRADE PYELOGRAM, URETEROSCOPY AND STENT PLACEMENT Left 05/09/2018   Procedure: CYSTOSCOPY WITH RETROGRADE PYELOGRAM, URETEROSCOPY AND STENT PLACEMENT;  Surgeon: Cleon Gustin, MD;  Location: [facility];  Service: Urology;  Laterality: Left;  . CYSTOSCOPY/URETEROSCOPY/HOLMIUM LASER/STENT PLACEMENT Bilateral 03/18/2017   Procedure: CYSTOSCOPY/ BILATERAL RETROGRADE/RIGHT URETEROSCOPY/ RIGHT HOLMIUM LASER APPLICATION/RIGHT URETERAL STENT PLACEMENT;  Surgeon: Irine Seal, MD;  Location: [facility];  Service: Urology;  Laterality: Bilateral;  . HOLMIUM LASER APPLICATION Left 3/53/2992   Procedure: HOLMIUM LASER APPLICATION;  Surgeon: Cleon Gustin, MD;  Location: [facility];  Service: Urology;  Laterality: Left;  . OPEN REDUCTION INTERNAL FIXATION (ORIF) DISTAL RADIAL FRACTURE Left 11/02/2014   Procedure: OPEN REDUCTION INTERNAL FIXATION (ORIF) DISTAL RADIAL FRACTURE;  Surgeon: Charlotte Crumb, MD;  Location: [facility]  OR;  Service: Orthopedics;  Laterality: Left;  . PARTIAL MASTECTOMY WITH NEEDLE LOCALIZATION Left 06/26/2015   Procedure: PARTIAL MASTECTOMY WITH NEEDLE LOCALIZATION;  Surgeon: Aviva Signs, MD;  Location: [facility];  Service: General;  Laterality: Left;  Needle Loc @ 8:00am  . POLYPECTOMY  02/03/2017   Procedure: POLYPECTOMY;  Surgeon: Danie Binder, MD;  [facility];  Service: Endoscopy;;  descending and hepatic flexure  . TARSAL METATARSAL ARTHRODESIS Left 09-07-2007   dr Beola Cord  [facility]   first and second tarsal metatarsal fusion, gastroc slide, neurolysis  . TOTAL ABDOMINAL HYSTERECTOMY W/ BILATERAL SALPINGOOPHORECTOMY  10-21-2004   dr soper  [facility]     SOCIAL HISTORY:  Social History   Socioeconomic History  . Marital status: Married    Spouse name: Not on file  . Number of children: 2  . Years of  education: Not on file  . Highest education level: Not on file  Occupational History  . Occupation: Retired  Scientific laboratory technician  . Financial resource strain: Not on file  . Food insecurity:    Worry: Not on file    Inability: Not on file  . Transportation needs:    Medical: Not on file    Non-medical: Not on file  Tobacco Use  . Smoking status: Former Smoker    Packs/day: 1.00    Years: 20.00    Pack years: 20.00    Types: Cigarettes    Last attempt to quit: 06/05/1986    Years since quitting: 32.4  . Smokeless tobacco: Never Used  Substance and Sexual Activity  . Alcohol use: No  . Drug use: No  . Sexual activity: Never    Birth control/protection: Surgical  Lifestyle  . Physical activity:    Days per week: Not on file    Minutes per session: Not on file  . Stress: Not on file  Relationships  . Social connections:    Talks on phone: Not on file    Gets together: Not on file    Attends religious service: Not on file    Active member of club or organization: Not on file    Attends meetings of clubs or organizations: Not on file    Relationship status: Not on file  . Intimate partner violence:    Fear of current or ex partner: Not on file    Emotionally abused: Not on file    Physically abused: Not on file    Forced sexual activity: Not on file  Other Topics Concern  . Not on file  Social History Narrative  . Not on file    FAMILY HISTORY:  Family History  Problem Relation Age of Onset  . Hypertension Mother   . Osteoporosis Mother   . Diabetes Sister        x3  . Colon cancer Neg Hx     CURRENT MEDICATIONS:  Outpatient Encounter Medications as of 11/24/2018  Medication Sig  . acetaminophen (TYLENOL 8 HOUR ARTHRITIS PAIN) 650 MG CR tablet Take 650 mg by mouth every 8 (eight) hours as needed for pain.  Marland Kitchen anastrozole (ARIMIDEX) 1 MG tablet Take 1 tablet (1 mg total) by mouth daily.  Marland Kitchen aspirin EC 81 MG tablet Take 81 mg by mouth every other day.  . Cholecalciferol  (VITAMIN D3) 1000 units CAPS Take 1,000 Units by mouth at bedtime.  . fenofibrate 160 MG tablet Take 160 mg by mouth every evening.   . fenofibrate 54 MG tablet Take by mouth.  Marland Kitchen  Ferrous Sulfate (IRON) 325 (65 Fe) MG TABS Take by mouth every morning.  Marland Kitchen glucose blood (ONE TOUCH ULTRA TEST) test strip Use to test blood sugar 1 to 2 times daily  . glucose blood (PRECISION QID TEST) test strip Please check blood sugars 1-2 times per day due to fluctuating blood sugars.  Marland Kitchen losartan (COZAAR) 100 MG tablet Take 100 mg by mouth every morning.  Marland Kitchen losartan (COZAAR) 100 MG tablet Take by mouth.  . Magnesium 400 MG TABS Take 1 tablet by mouth every morning.   . metFORMIN (GLUCOPHAGE) 1000 MG tablet Take 1,000 mg by mouth 2 (two) times daily with a meal.   . metFORMIN (GLUCOPHAGE) 1000 MG tablet Take by mouth.  . Multiple Vitamin (MULTIVITAMIN WITH MINERALS) TABS tablet Take 1 tablet by mouth every morning. Women's One A Day 50+   . Multiple Vitamin (MULTIVITAMIN) tablet Take by mouth.  . Omega-3 Fatty Acids (FISH OIL) 1200 MG CAPS Take 1,200 mg by mouth every morning.   Marland Kitchen omeprazole (PRILOSEC) 10 MG capsule Take by mouth.  Marland Kitchen omeprazole (PRILOSEC) 20 MG capsule Take 20 mg by mouth every morning.   . simvastatin (ZOCOR) 40 MG tablet Take 40 mg by mouth at bedtime.   . simvastatin (ZOCOR) 40 MG tablet Take by mouth.  . [DISCONTINUED] oxyCODONE-acetaminophen (PERCOCET/ROXICET) 5-325 MG tablet Take 1 tablet by mouth every 4 (four) hours as needed for severe pain.  . [DISCONTINUED] tamsulosin (FLOMAX) 0.4 MG CAPS capsule Take 1 capsule (0.4 mg total) by mouth daily after supper. (Patient not taking: Reported on 11/24/2018)   No facility-administered encounter medications on file as of 11/24/2018.     ALLERGIES:  No Known Allergies   PHYSICAL EXAM:  ECOG Performance status: 1  Vitals:   11/24/18 1154  BP: (!) 151/61  Pulse: 81  Temp: 97.9 F (36.6 C)   Filed Weights   11/24/18 1154  Weight: 179 lb  4.8 oz (81.3 kg)    Physical Exam Constitutional:      Appearance: Normal appearance. She is normal weight.  Musculoskeletal: Normal range of motion.  Skin:    General: Skin is warm and dry.  Neurological:     Mental Status: She is alert and oriented to person, place, and time. Mental status is at baseline.  Psychiatric:        Mood and Affect: Mood normal.        Behavior: Behavior normal.        Thought Content: Thought content normal.        Judgment: Judgment normal.   Breast: No palpable masses, no skin changes or nipple discharge, no adenopathy.   LABORATORY DATA:  I have reviewed the labs as listed.  CBC    Component Value Date/Time   WBC 6.5 11/17/2018 1154   RBC 4.36 11/17/2018 1154   HGB 12.6 11/17/2018 1154   HCT 40.3 11/17/2018 1154   PLT 237 11/17/2018 1154   MCV 92.4 11/17/2018 1154   MCH 28.9 11/17/2018 1154   MCHC 31.3 11/17/2018 1154   RDW 14.3 11/17/2018 1154   LYMPHSABS 1.4 11/17/2018 1154   MONOABS 0.6 11/17/2018 1154   EOSABS 0.1 11/17/2018 1154   BASOSABS 0.0 11/17/2018 1154   CMP Latest Ref Rng & Units 11/17/2018 04/28/2018 01/25/2018  Glucose 70 - 99 mg/dL 113(H) 111(H) 108(H)  BUN 8 - 23 mg/dL 28(H) 34(H) 30(H)  Creatinine 0.44 - 1.00 mg/dL 1.04(H) 1.49(H) 1.13(H)  Sodium 135 - 145 mmol/L 139 139 138  Potassium  3.5 - 5.1 mmol/L 4.5 4.6 4.0  Chloride 98 - 111 mmol/L 104 102 101  CO2 22 - 32 mmol/L _0 Calcium 8.9 - 10.3 mg/dL 10.0 10.1 10.4(H)  Total Protein 6.5 - 8.1 g/dL 6.9 - 7.3  Total Bilirubin 0.3 - 1.2 mg/dL 0.3 - 0.7  Alkaline Phos 38 - 126 U/L 67 - 55  AST 15 - 41 U/L 24 - 29  ALT 0 - 44 U/L 21 - 25       DIAGNOSTIC IMAGING:  I have independently reviewed the scans and discussed with the patient.   I have reviewed Francene Finders, NP's note and agree with the documentation.  I personally performed a face-to-face visit, made revisions and my assessment and plan is as follows.    ASSESSMENT & PLAN:   Breast cancer of  lower-inner quadrant of left female breast ([place]) 1.  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   -Today's physical examination shows lumpectomy scar in the undersurface of the left breast unchanged.  No palpable masses in bilateral breast.  No palpable adenopathy.  Her blood work was within normal limits. -She will come back in 6 months for follow-up and physical exam. -We will also schedule her for next mammogram in June.  2.  Osteopenia: -DEXA scan in October 2017 shows a T score of -0.2. -We reviewed the results of the DEXA scan from 09/08/2018 which shows a T score of -0.4. -She is taking Tums once a day.  I have told her to increase it to twice a day.      Orders placed this encounter:  Orders Placed This Encounter  Procedures  . MM Digital Diagnostic Bilat  . CBC with Differential/Platelet  . Comprehensive metabolic panel  . Lactate dehydrogenase  . Vitamin D 25 hydroxy      Derek Jack, MD [place] [phone]

## 2018-11-24 NOTE — Patient Instructions (Signed)
[place] at [facility] Discharge Instructions  Follow up in 6 months with labs. Please have your mammogram in June   Thank you for choosing [place] at [facility] to provide your oncology and hematology care.  To afford each patient quality time with our provider, please arrive at least 15 minutes before your scheduled appointment time.   If you have a lab appointment with the [place] please come in thru the  Main Entrance and check in at the main information desk  You need to re-schedule your appointment should you arrive 10 or more minutes late.  We strive to give you quality time with our providers, and arriving late affects you and other patients whose appointments are after yours.  Also, if you no show three or more times for appointments you may be dismissed from the clinic at the providers discretion.     Again, thank you for choosing [facility].  Our hope is that these requests will decrease the amount of time that you wait before being seen by our physicians.       _____________________________________________________________  Should you have questions after your visit to [facility], please contact our office at [phone] between the hours of 8:00 a.m. and 4:30 p.m.  Voicemails left after 4:00 p.m. will not be returned until the following business day.  For prescription refill requests, have your pharmacy contact our office and allow 72 hours.    Cancer Center Support Programs:   > Cancer Support Group  2nd Tuesday of the month 1pm-2pm, Journey Room

## 2018-12-19 DIAGNOSIS — E119 Type 2 diabetes mellitus without complications: Secondary | ICD-10-CM | POA: Diagnosis not present

## 2018-12-19 DIAGNOSIS — Z961 Presence of intraocular lens: Secondary | ICD-10-CM | POA: Diagnosis not present

## 2018-12-19 DIAGNOSIS — Z7984 Long term (current) use of oral hypoglycemic drugs: Secondary | ICD-10-CM | POA: Diagnosis not present

## 2019-01-18 DIAGNOSIS — E782 Mixed hyperlipidemia: Secondary | ICD-10-CM | POA: Diagnosis not present

## 2019-01-18 DIAGNOSIS — E1122 Type 2 diabetes mellitus with diabetic chronic kidney disease: Secondary | ICD-10-CM | POA: Diagnosis not present

## 2019-01-18 DIAGNOSIS — N183 Chronic kidney disease, stage 3 (moderate): Secondary | ICD-10-CM | POA: Diagnosis not present

## 2019-01-18 DIAGNOSIS — I129 Hypertensive chronic kidney disease with stage 1 through stage 4 chronic kidney disease, or unspecified chronic kidney disease: Secondary | ICD-10-CM | POA: Diagnosis not present

## 2019-01-25 ENCOUNTER — Encounter: Payer: Self-pay | Admitting: *Deleted

## 2019-01-25 DIAGNOSIS — E1122 Type 2 diabetes mellitus with diabetic chronic kidney disease: Secondary | ICD-10-CM | POA: Diagnosis not present

## 2019-01-25 DIAGNOSIS — Z87891 Personal history of nicotine dependence: Secondary | ICD-10-CM | POA: Diagnosis not present

## 2019-01-25 DIAGNOSIS — Z170 Estrogen receptor positive status [ER+]: Secondary | ICD-10-CM | POA: Diagnosis not present

## 2019-01-25 DIAGNOSIS — C50312 Malignant neoplasm of lower-inner quadrant of left female breast: Secondary | ICD-10-CM | POA: Diagnosis not present

## 2019-01-25 DIAGNOSIS — Z79899 Other long term (current) drug therapy: Secondary | ICD-10-CM | POA: Diagnosis not present

## 2019-01-25 DIAGNOSIS — K219 Gastro-esophageal reflux disease without esophagitis: Secondary | ICD-10-CM | POA: Diagnosis not present

## 2019-01-25 DIAGNOSIS — Z0000 Encounter for general adult medical examination without abnormal findings: Secondary | ICD-10-CM | POA: Diagnosis not present

## 2019-01-25 DIAGNOSIS — I129 Hypertensive chronic kidney disease with stage 1 through stage 4 chronic kidney disease, or unspecified chronic kidney disease: Secondary | ICD-10-CM | POA: Diagnosis not present

## 2019-01-25 DIAGNOSIS — N183 Chronic kidney disease, stage 3 (moderate): Secondary | ICD-10-CM | POA: Diagnosis not present

## 2019-01-25 DIAGNOSIS — E782 Mixed hyperlipidemia: Secondary | ICD-10-CM | POA: Diagnosis not present

## 2019-03-28 ENCOUNTER — Other Ambulatory Visit (HOSPITAL_COMMUNITY): Payer: Self-pay | Admitting: Hematology

## 2019-03-28 DIAGNOSIS — C50312 Malignant neoplasm of lower-inner quadrant of left female breast: Secondary | ICD-10-CM

## 2019-04-14 ENCOUNTER — Other Ambulatory Visit (HOSPITAL_COMMUNITY): Payer: Self-pay | Admitting: *Deleted

## 2019-04-14 DIAGNOSIS — C50312 Malignant neoplasm of lower-inner quadrant of left female breast: Secondary | ICD-10-CM

## 2019-04-14 MED ORDER — ANASTROZOLE 1 MG PO TABS: 1.0000 mg | ORAL_TABLET | Freq: Every day | ORAL | 0 refills | Status: DC

## 2019-05-04 ENCOUNTER — Other Ambulatory Visit (HOSPITAL_COMMUNITY): Payer: Self-pay | Admitting: Nurse Practitioner

## 2019-05-04 DIAGNOSIS — C50312 Malignant neoplasm of lower-inner quadrant of left female breast: Secondary | ICD-10-CM

## 2019-05-04 DIAGNOSIS — Z170 Estrogen receptor positive status [ER+]: Secondary | ICD-10-CM

## 2019-05-08 DIAGNOSIS — M5431 Sciatica, right side: Secondary | ICD-10-CM | POA: Diagnosis not present

## 2019-05-16 ENCOUNTER — Ambulatory Visit (HOSPITAL_COMMUNITY): Payer: PPO

## 2019-05-16 ENCOUNTER — Ambulatory Visit (HOSPITAL_COMMUNITY): Admission: RE | Admit: 2019-05-16 | Payer: PPO | Source: Ambulatory Visit

## 2019-05-16 ENCOUNTER — Inpatient Hospital Stay (HOSPITAL_COMMUNITY): Payer: PPO | Attending: Hematology

## 2019-05-16 ENCOUNTER — Other Ambulatory Visit: Payer: Self-pay

## 2019-05-16 ENCOUNTER — Ambulatory Visit (HOSPITAL_COMMUNITY)
Admission: RE | Admit: 2019-05-16 | Discharge: 2019-05-16 | Disposition: A | Discharge status: Home / Self Care | Payer: PPO | Source: Ambulatory Visit | Attending: Nurse Practitioner | Admitting: Nurse Practitioner

## 2019-05-16 DIAGNOSIS — C50312 Malignant neoplasm of lower-inner quadrant of left female breast: Secondary | ICD-10-CM | POA: Insufficient documentation

## 2019-05-16 DIAGNOSIS — Z170 Estrogen receptor positive status [ER+]: Secondary | ICD-10-CM | POA: Diagnosis not present

## 2019-05-16 DIAGNOSIS — Z923 Personal history of irradiation: Secondary | ICD-10-CM | POA: Diagnosis not present

## 2019-05-16 DIAGNOSIS — Z79811 Long term (current) use of aromatase inhibitors: Secondary | ICD-10-CM | POA: Insufficient documentation

## 2019-05-16 DIAGNOSIS — R928 Other abnormal and inconclusive findings on diagnostic imaging of breast: Secondary | ICD-10-CM | POA: Diagnosis not present

## 2019-05-16 LAB — COMPREHENSIVE METABOLIC PANEL
ALT: 24 U/L (ref 0–44)
AST: 23 U/L (ref 15–41)
Albumin: 4.1 g/dL (ref 3.5–5.0)
Alkaline Phosphatase: 58 U/L (ref 38–126)
Anion gap: 12 (ref 5–15)
BUN: 26 mg/dL — ABNORMAL HIGH (ref 8–23)
CO2: 29 mmol/L (ref 22–32)
Calcium: 10 mg/dL (ref 8.9–10.3)
Chloride: 100 mmol/L (ref 98–111)
Creatinine, Ser: 1.12 mg/dL — ABNORMAL HIGH (ref 0.44–1.00)
GFR calc Af Amer: 56 mL/min — ABNORMAL LOW (ref >60)
GFR calc non Af Amer: 48 mL/min — ABNORMAL LOW (ref >60)
Glucose, Bld: 161 mg/dL — ABNORMAL HIGH (ref 70–99)
Potassium: 4.3 mmol/L (ref 3.5–5.1)
Sodium: 141 mmol/L (ref 135–145)
Total Bilirubin: 0.4 mg/dL (ref 0.3–1.2)
Total Protein: 6.8 g/dL (ref 6.5–8.1)

## 2019-05-16 LAB — CBC WITH DIFFERENTIAL/PLATELET
Abs Immature Granulocytes: 0.1 10*3/uL — ABNORMAL HIGH (ref 0.00–0.07)
Basophils Absolute: 0 10*3/uL (ref 0.0–0.1)
Basophils Relative: 0 %
Eosinophils Absolute: 0 10*3/uL (ref 0.0–0.5)
Eosinophils Relative: 0 %
HCT: 41 % (ref 36.0–46.0)
Hemoglobin: 13.1 g/dL (ref 12.0–15.0)
Immature Granulocytes: 1 %
Lymphocytes Relative: 9 %
Lymphs Abs: 0.7 10*3/uL (ref 0.7–4.0)
MCH: 29.6 pg (ref 26.0–34.0)
MCHC: 32 g/dL (ref 30.0–36.0)
MCV: 92.8 fL (ref 80.0–100.0)
Monocytes Absolute: 0.3 10*3/uL (ref 0.1–1.0)
Monocytes Relative: 4 %
Neutro Abs: 6.9 10*3/uL (ref 1.7–7.7)
Neutrophils Relative %: 86 %
Platelets: 200 10*3/uL (ref 150–400)
RBC: 4.42 MIL/uL (ref 3.87–5.11)
RDW: 14.6 % (ref 11.5–15.5)
WBC: 8.1 10*3/uL (ref 4.0–10.5)
nRBC: 0 % (ref 0.0–0.2)

## 2019-05-16 LAB — LACTATE DEHYDROGENASE: LDH: 169 U/L (ref 98–192)

## 2019-05-17 LAB — VITAMIN D 25 HYDROXY (VIT D DEFICIENCY, FRACTURES): Vit D, 25-Hydroxy: 54.3 ng/mL (ref 30.0–100.0)

## 2019-05-22 ENCOUNTER — Encounter (HOSPITAL_COMMUNITY): Payer: Self-pay | Admitting: Hematology

## 2019-05-22 ENCOUNTER — Other Ambulatory Visit: Payer: Self-pay

## 2019-05-22 ENCOUNTER — Inpatient Hospital Stay (HOSPITAL_COMMUNITY): Payer: PPO | Attending: Hematology | Admitting: Hematology

## 2019-05-22 DIAGNOSIS — Z7984 Long term (current) use of oral hypoglycemic drugs: Secondary | ICD-10-CM | POA: Diagnosis not present

## 2019-05-22 DIAGNOSIS — R42 Dizziness and giddiness: Secondary | ICD-10-CM | POA: Diagnosis not present

## 2019-05-22 DIAGNOSIS — Z170 Estrogen receptor positive status [ER+]: Secondary | ICD-10-CM | POA: Diagnosis not present

## 2019-05-22 DIAGNOSIS — E1122 Type 2 diabetes mellitus with diabetic chronic kidney disease: Secondary | ICD-10-CM | POA: Diagnosis not present

## 2019-05-22 DIAGNOSIS — Z87891 Personal history of nicotine dependence: Secondary | ICD-10-CM | POA: Diagnosis not present

## 2019-05-22 DIAGNOSIS — Z79811 Long term (current) use of aromatase inhibitors: Secondary | ICD-10-CM | POA: Insufficient documentation

## 2019-05-22 DIAGNOSIS — N183 Chronic kidney disease, stage 3 (moderate): Secondary | ICD-10-CM | POA: Insufficient documentation

## 2019-05-22 DIAGNOSIS — M8580 Other specified disorders of bone density and structure, unspecified site: Secondary | ICD-10-CM | POA: Diagnosis not present

## 2019-05-22 DIAGNOSIS — C50312 Malignant neoplasm of lower-inner quadrant of left female breast: Secondary | ICD-10-CM | POA: Insufficient documentation

## 2019-05-22 DIAGNOSIS — I129 Hypertensive chronic kidney disease with stage 1 through stage 4 chronic kidney disease, or unspecified chronic kidney disease: Secondary | ICD-10-CM | POA: Insufficient documentation

## 2019-05-22 DIAGNOSIS — Z923 Personal history of irradiation: Secondary | ICD-10-CM | POA: Insufficient documentation

## 2019-05-22 DIAGNOSIS — Z79899 Other long term (current) drug therapy: Secondary | ICD-10-CM | POA: Diagnosis not present

## 2019-05-22 DIAGNOSIS — E785 Hyperlipidemia, unspecified: Secondary | ICD-10-CM | POA: Insufficient documentation

## 2019-05-22 DIAGNOSIS — R600 Localized edema: Secondary | ICD-10-CM | POA: Insufficient documentation

## 2019-05-22 NOTE — Assessment & Plan Note (Signed)
1.  Stage Ia (T1 cN0 M0) IDC of the left breast: - Status post lumpectomy on 06/26/2015, ER/PR positive, HER-2 negative -Radiation therapy from 09/10/2015 through 10/08/2015 - Anastrozole started in November 2016 -I reviewed mammogram from 05/16/2019, BI-RADS Category 2. - Physical exam today shows lumpectomy scar in the inframammary area of the left breast unchanged.  No palpable mass in bilateral breast.  No palpable adenopathy. - She will come back in 6 months for follow-up with labs.  2.  Osteopenia: -DEXA scan in October 2017 shows T score of -0.2. -We reviewed results of DEXA scan from 09/08/2018 which shows a T score of -0.4. -She is taking Tums twice daily.  She also takes vitamin D thousand units daily.  Vitamin D level was 54.

## 2019-05-22 NOTE — Progress Notes (Signed)
[name], [name] [id number]   CLINIC:  Medical Oncology/Hematology  PCP:  Christain Sacramento, MD [address] [place] [id number] [phone]   REASON FOR VISIT: Follow-up for left breast cancer.  CURRENT THERAPY: Anastrozole 1 mg daily.  BRIEF ONCOLOGIC HISTORY:  Oncology History  Breast cancer ([place]) (Resolved)  08/16/2015 Initial Diagnosis   Breast cancer ([place])   09/11/2015 -  Radiation Therapy   Dr. 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 ER+ 100%, PR+ 80%   05/02/2015 Procedure   Left breast stereotactic core needle biopsy   06/26/2015 Oncotype testing   ONCOTYPE DX assay with recurrence score result of 5, Low risk, 10 year risk of distance recurrence tamoxifen alone 5%   06/26/2015 Definitive Surgery   Breast partial mastectomy after needle localization with Dr. Arnoldo Morale no sentinel node performed   06/26/2015 Pathology Results   INVASIVE DUCTAL CARCINOMA, 2 CM. - HIGH GRADE DUCTAL CARCINOMA IN SITU. - INVASIVE CARCINOMA FOCALLY LESS THAN 0.1 CM FROM LATERAL MARGIN. - DUCTAL CARCINOMA IN SITU FOCALLY LESS THAN 0.1 CM FROM THE LATERAL MARGIN.   06/26/2015 Pathology Results   ER+ 100%, PR + 60%, Ki-67 25%, HER2 NEGATIVE   07/31/2015 Procedure   Sentinel node procedure   07/31/2015 Pathology Results   ONE BENIGN LYMPH NODE (0/1).   08/26/2015 Imaging   Bone density- Normal   09/10/2015 - 10/08/2015 Radiation Therapy   50 Gy left breast by Dr. Isidore Moos.   10/16/2015 -  Anti-estrogen oral therapy   Arimidex   05/08/2016 Procedure   Stereotactic-guided biopsies of the calcifications of left  breast.   05/08/2016 Pathology Results   Breast, left, needle core biopsy, lateral and ant to lumpectomy site FIBROCYSTIC CHANGES VASCULAR CALCIFICATIONS NO EVIDENCE OF MALIGNANCY   05/08/2016 Pathology Results   2. Breast, left, needle core biopsy, lumpectomy site FAT NECROSIS WITH FIBROSIS AND MICROCALCIFICATIONS NO EVIDENCE OF MALIGNANCY   06/09/2016 Surgery   Left breast biopsy for microcalcifications. Path: benign.      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 Bezanson 73 y.o. female returns for follow-up of left breast cancer.  She is tolerating anastrozole very well.  Denies missing any doses.  Occasional hot flashes at nighttime present.  No musculoskeletal symptoms.  She is having right hip sciatica for which she was given 10 days of prednisone.  She does have some dizziness from inner ear problems which is stable.  Appetite is 100%.  Energy levels are 75%.  Denies any nausea vomiting or diarrhea.  Denies any ER visits or hospitalizations.   REVIEW OF SYSTEMS:  Review of Systems  Cardiovascular: Positive for leg swelling.  Neurological: Positive for dizziness.  All other systems reviewed and are negative.    PAST MEDICAL/SURGICAL HISTORY:  Past Medical History:  Diagnosis Date  . Abdominal hernia    per CT 04-28-2018  abdominal/ pelvic anterior wall hernia  . Arthritis   .  CKD (chronic kidney disease), stage III ([place])   . Disorder of bone and cartilage, unspecified   . Essential hypertension, benign   . Full dentures   . GERD (gastroesophageal reflux disease)   . 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 History of kidney stones   . History of radiation therapy 09-10-2015 to [id number]   left breast 50Gy   . Iron deficiency anemia   . Malignant neoplasm of lower-inner quadrant of left breast in female, estrogen receptor positive [facility]) oncologist-  dr Mathis Dad higgs ([facility])   dx 08-16-2015--  Stage 1A invasisive (T1c,N0,M0), ER/PR+, HER2 negative---- 06-26-2015 left partial mastectomy , 07-31-2015 left axilla node dissection x1 (negative)-- completed radiation 10-08-2015 and started arimidex 10-16-2015  . Mixed hyperlipidemia   . Nephrolithiasis    bilateral per CT 04-28-2018  . Type 2 diabetes mellitus ([place])    followed by pcp  . Ureteral calculi    left  . Urgency of urination   . Wears glasses    Past Surgical History:  Procedure Laterality Date  . AXILLARY SENTINEL NODE BIOPSY Left 07/31/2015   Procedure: SENTINEL LYMPH NODE BIOPSY, LEFT AXILLA, ;  Surgeon: Aviva Signs, MD;  Location: [facility];  Service: General;  Laterality: Left;  Sentinel Node @ 1000  . BREAST BIOPSY Left 06/09/2016   Procedure: LEFT BREAST BIOPSY AFTER NEEDLE LOCALIZATION;  Surgeon: Aviva Signs, MD;  Location: [facility];  Service: General;  Laterality: Left;  . CARPAL TUNNEL RELEASE Left 11/02/2014   Procedure: CARPAL TUNNEL RELEASE;  Surgeon: Charlotte Crumb, MD;  Location: [facility];  Service: Orthopedics;  Laterality: Left;  . CATARACT EXTRACTION W/ INTRAOCULAR LENS  IMPLANT, BILATERAL  2007  . COLONOSCOPY N/A 02/03/2017   Procedure: COLONOSCOPY;  Surgeon: Danie Binder, MD;  [facility];  Service: Endoscopy;  Laterality: N/A;  1245  . COLONOSCOPY W/ BIOPSIES AND POLYPECTOMY    . CYSTOSCOPY WITH RETROGRADE PYELOGRAM, URETEROSCOPY AND STENT PLACEMENT Left 05/09/2018   Procedure: CYSTOSCOPY WITH RETROGRADE PYELOGRAM, URETEROSCOPY AND STENT PLACEMENT;  Surgeon: Cleon Gustin, MD;  Location: [facility];  Service: Urology;  Laterality: Left;  . CYSTOSCOPY/URETEROSCOPY/HOLMIUM LASER/STENT PLACEMENT Bilateral 03/18/2017   Procedure: CYSTOSCOPY/ BILATERAL RETROGRADE/RIGHT URETEROSCOPY/  RIGHT HOLMIUM LASER APPLICATION/RIGHT URETERAL STENT PLACEMENT;  Surgeon: Irine Seal, MD;  Location: [facility];  Service: Urology;  Laterality: Bilateral;  . HOLMIUM LASER APPLICATION Left 2/87/6811   Procedure: HOLMIUM LASER APPLICATION;  Surgeon: Cleon Gustin, MD;  Location: [facility];  Service: Urology;  Laterality: Left;  . OPEN REDUCTION INTERNAL FIXATION (ORIF) DISTAL RADIAL FRACTURE Left 11/02/2014   Procedure: OPEN REDUCTION INTERNAL FIXATION (ORIF) DISTAL RADIAL FRACTURE;  Surgeon: Charlotte Crumb, MD;  Location: [place];  Service: Orthopedics;  Laterality: Left;  . PARTIAL MASTECTOMY WITH NEEDLE LOCALIZATION Left 06/26/2015   Procedure: PARTIAL MASTECTOMY WITH NEEDLE LOCALIZATION;  Surgeon: Aviva Signs, MD;  Location: [facility];  Service: General;  Laterality: Left;  Needle Loc @ 8:00am  . POLYPECTOMY  02/03/2017   Procedure: POLYPECTOMY;  Surgeon: Danie Binder, MD;  [facility];  Service: Endoscopy;;  descending and hepatic flexure  . TARSAL METATARSAL ARTHRODESIS Left 09-07-2007   dr Beola Cord  [facility]   first and second tarsal metatarsal fusion, gastroc slide, neurolysis  . TOTAL ABDOMINAL HYSTERECTOMY W/ BILATERAL SALPINGOOPHORECTOMY  10-21-2004   dr soper  [facility]     SOCIAL HISTORY:  Social History  Socioeconomic History  . Marital status: Married    Spouse name: Not on file  . Number of children: 2  . Years of education: Not on file  . Highest education level: Not on file  Occupational History  . Occupation: Retired  Scientific laboratory technician  . Financial resource strain: Not on file  . Food insecurity    Worry: Not on file    Inability: Not on file  . Transportation needs    Medical: Not on file    Non-medical: Not on file  Tobacco Use  . Smoking status: Former Smoker    Packs/day: 1.00    Years: 20.00    Pack years: 20.00    Types: Cigarettes    Quit date: 06/05/1986    Years since quitting: 32.9  . Smokeless tobacco: Never Used  Substance and  Sexual Activity  . Alcohol use: No  . Drug use: No  . Sexual activity: Never    Birth control/protection: Surgical  Lifestyle  . Physical activity    Days per week: Not on file    Minutes per session: Not on file  . Stress: Not on file  Relationships  . Social Herbalist on phone: Not on file    Gets together: Not on file    Attends religious service: Not on file    Active member of club or organization: Not on file    Attends meetings of clubs or organizations: Not on file    Relationship status: Not on file  . Intimate partner violence    Fear of current or ex partner: Not on file    Emotionally abused: Not on file    Physically abused: Not on file    Forced sexual activity: Not on file  Other Topics Concern  . Not on file  Social History Narrative  . Not on file    FAMILY HISTORY:  Family History  Problem Relation Age of Onset  . Hypertension Mother   . Osteoporosis Mother   . Diabetes Sister        x3  . Colon cancer Neg Hx     CURRENT MEDICATIONS:  Outpatient Encounter Medications as of 05/22/2019  Medication Sig  . calcium carbonate (TUMS EX) 750 MG chewable tablet Chew 1 tablet by mouth daily.  Marland Kitchen acetaminophen (TYLENOL 8 HOUR ARTHRITIS PAIN) 650 MG CR tablet Take 650 mg by mouth every 8 (eight) hours as needed for pain.  Marland Kitchen anastrozole (ARIMIDEX) 1 MG tablet Take 1 tablet (1 mg total) by mouth daily.  . Cholecalciferol (VITAMIN D3) 1000 units CAPS Take 1,000 Units by mouth at bedtime.  . fenofibrate 54 MG tablet Take 54 mg by mouth daily.   . Ferrous Sulfate (IRON) 325 (65 Fe) MG TABS Take by mouth every morning.  Marland Kitchen glucose blood (ONE TOUCH ULTRA TEST) test strip 1 each 2 (two) times daily. Check blood sugar 1-2 times daily  . glucose blood (PRECISION QID TEST) test strip 1 each 2 (two) times daily. Check Blood sugar 1-2 times daily  . losartan (COZAAR) 100 MG tablet Take 100 mg by mouth every morning.  . metFORMIN (GLUCOPHAGE) 1000 MG tablet Take  1,000 mg by mouth 2 (two) times daily with a meal.   . Multiple Vitamin (MULTIVITAMIN WITH MINERALS) TABS tablet Take 1 tablet by mouth every morning. Women's One A Day 50+   . Omega-3 Fatty Acids (FISH OIL) 1200 MG CAPS Take 1,000 mg by mouth 2 (two) times daily.   Marland Kitchen  omeprazole (PRILOSEC) 10 MG capsule Take 10 mg by mouth daily.   . simvastatin (ZOCOR) 40 MG tablet Take 40 mg by mouth at bedtime.   . [DISCONTINUED] aspirin EC 81 MG tablet Take 81 mg by mouth every other day.  . [DISCONTINUED] fenofibrate 160 MG tablet Take 160 mg by mouth every evening.   . [DISCONTINUED] losartan (COZAAR) 100 MG tablet Take by mouth.  . [DISCONTINUED] Magnesium 400 MG TABS Take 1 tablet by mouth every morning.   . [DISCONTINUED] metFORMIN (GLUCOPHAGE) 1000 MG tablet Take by mouth.  . [DISCONTINUED] Multiple Vitamin (MULTIVITAMIN) tablet Take by mouth daily.   . [DISCONTINUED] omeprazole (PRILOSEC) 20 MG capsule Take 20 mg by mouth every morning.   . [DISCONTINUED] simvastatin (ZOCOR) 40 MG tablet Take by mouth.   No facility-administered encounter medications on file as of 05/22/2019.     ALLERGIES:  No Known Allergies   PHYSICAL EXAM:  ECOG Performance status: 1  Vitals:   05/22/19 1407  BP: 139/70  Pulse: 88  Resp: 18  Temp: 97.7 F (36.5 C)  SpO2: 96%   Filed Weights   05/22/19 1407  Weight: 180 lb (81.6 kg)    Physical Exam Vitals signs reviewed.  Constitutional:      Appearance: Normal appearance.  Cardiovascular:     Rate and Rhythm: Normal rate and regular rhythm.     Heart sounds: Normal heart sounds.  Pulmonary:     Effort: Pulmonary effort is normal.     Breath sounds: Normal breath sounds.  Abdominal:     General: There is no distension.     Palpations: Abdomen is soft. There is no mass.  Musculoskeletal: Normal range of motion.  Skin:    General: Skin is warm and dry.  Neurological:     Mental Status: She is alert and oriented to person, place, and time. Mental  status is at baseline.  Psychiatric:        Mood and Affect: Mood normal.        Behavior: Behavior normal.   Breast: Inframammary area lumpectomy scar in the left breast is stable.  No palpable mass in bilateral breast.  No palpable adenopathy.   LABORATORY DATA:  I have reviewed the labs as listed.  CBC    Component Value Date/Time   WBC 8.1 05/16/2019 1052   RBC 4.42 05/16/2019 1052   HGB 13.1 05/16/2019 1052   HCT 41.0 05/16/2019 1052   PLT 200 05/16/2019 1052   MCV 92.8 05/16/2019 1052   MCH 29.6 05/16/2019 1052   MCHC 32.0 05/16/2019 1052   RDW 14.6 05/16/2019 1052   LYMPHSABS 0.7 05/16/2019 1052   MONOABS 0.3 05/16/2019 1052   EOSABS 0.0 05/16/2019 1052   BASOSABS 0.0 05/16/2019 1052   CMP Latest Ref Rng & Units 05/16/2019 11/17/2018 04/28/2018  Glucose 70 - 99 mg/dL 161(H) 113(H) 111(H)  BUN 8 - 23 mg/dL 26(H) 28(H) 34(H)  Creatinine 0.44 - 1.00 mg/dL 1.12(H) 1.04(H) 1.49(H)  Sodium 135 - 145 mmol/L 141 139 139  Potassium 3.5 - 5.1 mmol/L 4.3 4.5 4.6  Chloride 98 - 111 mmol/L 100 104 102  CO2 22 - 32 mmol/L _0 Calcium 8.9 - 10.3 mg/dL 10.0 10.0 10.1  Total Protein 6.5 - 8.1 g/dL 6.8 6.9 -  Total Bilirubin 0.3 - 1.2 mg/dL 0.4 0.3 -  Alkaline Phos 38 - 126 U/L 58 67 -  AST 15 - 41 U/L 23 24 -  ALT 0 - 44 U/L  24 21 -       DIAGNOSTIC IMAGING:  I have independently reviewed the scans and discussed with the patient.     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  Stage Ia (T1 cN0 M0) IDC of the left breast: - Status post lumpectomy on 06/26/2015, ER/PR positive, HER-2 negative -Radiation therapy from 09/10/2015 through 10/08/2015 - Anastrozole started in November 2016 -I reviewed mammogram from 05/16/2019, BI-RADS Category 2. - Physical exam today shows lumpectomy scar in the inframammary area of the left breast unchanged.  No palpable mass in bilateral breast.  No palpable adenopathy. - She will come  back in 6 months for follow-up with labs.  2.  Osteopenia: -DEXA scan in October 2017 shows T score of -0.2. -We reviewed results of DEXA scan from 09/08/2018 which shows a T score of -0.4. -She is taking Tums twice daily.  She also takes vitamin D thousand units daily.  Vitamin D level was 54.   Total time spent is 25 minutes with more than 50% of the time spent face-to-face discussing surveillance plan, counseling and coordination of care.  Orders placed this encounter:  No orders of the defined types were placed in this encounter.     Derek Jack, MD [place] [phone]

## 2019-05-22 NOTE — Patient Instructions (Addendum)
[facility] at [facility] Discharge Instructions  You were seen today by Dr. Katragadda. He went over your recent lab results. He will see you back in 6 months for labs and follow up.   Thank you for choosing [facility] at [facility] to provide your oncology and hematology care.  To afford each patient quality time with our provider, please arrive at least 15 minutes before your scheduled appointment time.   If you have a lab appointment with the Cancer Center please come in thru the  Main Entrance and check in at the main information desk  You need to re-schedule your appointment should you arrive 10 or more minutes late.  We strive to give you quality time with our providers, and arriving late affects you and other patients whose appointments are after yours.  Also, if you no show three or more times for appointments you may be dismissed from the clinic at the providers discretion.     Again, thank you for choosing [facility].  Our hope is that these requests will decrease the amount of time that you wait before being seen by our physicians.       _____________________________________________________________  Should you have questions after your visit to [facility], please contact our office at [phone] between the hours of 8:00 a.m. and 4:30 p.m.  Voicemails left after 4:00 p.m. will not be returned until the following business day.  For prescription refill requests, have your pharmacy contact our office and allow 72 hours.    Cancer Center Support Programs:   > Cancer Support Group  2nd Tuesday of the month 1pm-2pm, Journey Room    

## 2019-05-22 NOTE — Progress Notes (Signed)
Pt is taking Arimidex as prescribed with no side effects. 

## 2019-07-26 DIAGNOSIS — M5431 Sciatica, right side: Secondary | ICD-10-CM | POA: Diagnosis not present

## 2019-07-26 DIAGNOSIS — N183 Chronic kidney disease, stage 3 (moderate): Secondary | ICD-10-CM | POA: Diagnosis not present

## 2019-07-26 DIAGNOSIS — E1122 Type 2 diabetes mellitus with diabetic chronic kidney disease: Secondary | ICD-10-CM | POA: Diagnosis not present

## 2019-07-26 DIAGNOSIS — I129 Hypertensive chronic kidney disease with stage 1 through stage 4 chronic kidney disease, or unspecified chronic kidney disease: Secondary | ICD-10-CM | POA: Diagnosis not present

## 2019-07-26 DIAGNOSIS — K219 Gastro-esophageal reflux disease without esophagitis: Secondary | ICD-10-CM | POA: Diagnosis not present

## 2019-07-26 DIAGNOSIS — E782 Mixed hyperlipidemia: Secondary | ICD-10-CM | POA: Diagnosis not present

## 2019-07-26 DIAGNOSIS — I10 Essential (primary) hypertension: Secondary | ICD-10-CM | POA: Diagnosis not present

## 2019-07-27 ENCOUNTER — Other Ambulatory Visit: Payer: Self-pay

## 2019-07-27 ENCOUNTER — Other Ambulatory Visit (HOSPITAL_COMMUNITY): Payer: Self-pay | Admitting: Family Medicine

## 2019-07-27 ENCOUNTER — Ambulatory Visit (HOSPITAL_COMMUNITY)
Admission: RE | Admit: 2019-07-27 | Discharge: 2019-07-27 | Disposition: A | Discharge status: Home / Self Care | Payer: PPO | Source: Ambulatory Visit | Attending: Family Medicine | Admitting: Family Medicine

## 2019-07-27 DIAGNOSIS — M545 Low back pain: Secondary | ICD-10-CM | POA: Diagnosis not present

## 2019-07-27 DIAGNOSIS — M5431 Sciatica, right side: Secondary | ICD-10-CM | POA: Insufficient documentation

## 2019-07-27 DIAGNOSIS — M1611 Unilateral primary osteoarthritis, right hip: Secondary | ICD-10-CM | POA: Diagnosis not present

## 2019-08-23 ENCOUNTER — Other Ambulatory Visit: Payer: Self-pay

## 2019-08-23 ENCOUNTER — Encounter: Payer: Self-pay | Admitting: Orthopedic Surgery

## 2019-08-23 ENCOUNTER — Ambulatory Visit: Payer: PPO | Admitting: Orthopedic Surgery

## 2019-08-23 VITALS — BP 165/67 | HR 91 | Temp 97.1°F | Ht 65.0 in | Wt 183.2 lb

## 2019-08-23 DIAGNOSIS — M4156 Other secondary scoliosis, lumbar region: Secondary | ICD-10-CM | POA: Diagnosis not present

## 2019-08-23 DIAGNOSIS — M1611 Unilateral primary osteoarthritis, right hip: Secondary | ICD-10-CM

## 2019-08-23 DIAGNOSIS — M4316 Spondylolisthesis, lumbar region: Secondary | ICD-10-CM | POA: Diagnosis not present

## 2019-08-23 MED ORDER — OMEPRAZOLE 20 MG PO CPDR: 20.0000 mg | DELAYED_RELEASE_CAPSULE | Freq: Every day | ORAL | 5 refills | Status: DC

## 2019-08-23 MED ORDER — MELOXICAM 7.5 MG PO TABS: 7.5000 mg | ORAL_TABLET | Freq: Every day | ORAL | 5 refills | Status: DC

## 2019-08-23 NOTE — Progress Notes (Signed)
Patient ID: [name], female   DOB: 1946-05-05, 73 y.o.   MRN: [id number]  Chief Complaint  Patient presents with  . right hip pain    started may  . right leg pain    started in may    HPI AVERYANNA Sims is a 73 y.o. female.  This patient has a history of breast cancer presents with a 44-monthhistory of right-sided lower back and buttock pain and tightness associated with radiation of pain in the calf and when painful some weakness in the right leg.  She has had 2 rounds of prednisone which did help but once the prednisone tapered off to 1 tablet her pain returned.  She has not had any physical therapy denies any weight loss Review of Systems Review of Systems  Genitourinary: Positive for flank pain.    Past Medical History:  Diagnosis Date  . Abdominal hernia    per CT 04-28-2018  abdominal/ pelvic anterior wall hernia  . Arthritis   . CKD (chronic kidney disease), stage III ([place]   . Disorder of bone and cartilage, unspecified   . Essential hypertension, benign   . Full dentures   . GERD (gastroesophageal reflux disease)   . 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 History of kidney stones   . History of radiation therapy 09-10-2015 to [phone]   left breast 50Gy  . Iron deficiency anemia   . Malignant neoplasm of lower-inner quadrant of left breast in female, estrogen receptor positive ([facility] oncologist-  dr vMathis Dadhiggs ([facility])   dx 08-16-2015--  Stage 1A invasisive (T1c,N0,M0), ER/PR+, HER2 negative---- 06-26-2015 left partial mastectomy , 07-31-2015 left axilla node dissection x1 (negative)-- completed radiation 10-08-2015 and started arimidex 10-16-2015  . Mixed hyperlipidemia   . Nephrolithiasis    bilateral per CT 04-28-2018  . Type 2 diabetes mellitus ([place]    followed by pcp  . Ureteral calculi    left  . Urgency of urination   . Wears glasses     Past Surgical History:  Procedure Laterality Date  .  AXILLARY SENTINEL NODE BIOPSY Left 07/31/2015   Procedure: SENTINEL LYMPH NODE BIOPSY, LEFT AXILLA, ;  Surgeon: MAviva Signs MD;  Location: [facility];  Service: General;  Laterality: Left;  Sentinel Node @ 1000  . BREAST BIOPSY Left 06/09/2016   Procedure: LEFT BREAST BIOPSY AFTER NEEDLE LOCALIZATION;  Surgeon: MAviva Signs MD;  Location: [facility];  Service: General;  Laterality: Left;  . CARPAL TUNNEL RELEASE Left 11/02/2014   Procedure: CARPAL TUNNEL RELEASE;  Surgeon: MCharlotte Crumb MD;  [place] [facility]  Service: Orthopedics;  Laterality: Left;  . CATARACT EXTRACTION W/ INTRAOCULAR LENS  IMPLANT, BILATERAL  2007  . COLONOSCOPY N/A 02/03/2017   Procedure: COLONOSCOPY;  Surgeon: SDanie Binder MD;  [facility];  Service: Endoscopy;  Laterality: N/A;  1245  . COLONOSCOPY W/ BIOPSIES AND POLYPECTOMY    . CYSTOSCOPY WITH RETROGRADE PYELOGRAM, URETEROSCOPY AND STENT PLACEMENT Left 05/09/2018   Procedure: CYSTOSCOPY WITH RETROGRADE PYELOGRAM, URETEROSCOPY AND STENT PLACEMENT;  Surgeon: MCleon Gustin MD;  [facility]  Service: Urology;  Laterality: Left;  . CYSTOSCOPY/URETEROSCOPY/HOLMIUM LASER/STENT PLACEMENT Bilateral 03/18/2017   Procedure: CYSTOSCOPY/ BILATERAL RETROGRADE/RIGHT URETEROSCOPY/ RIGHT HOLMIUM LASER APPLICATION/RIGHT URETERAL STENT PLACEMENT;  Surgeon: JIrine Seal MD;  [facility];  Service: Urology;  Laterality: Bilateral;  . HOLMIUM LASER APPLICATION Left 61/91/4782  Procedure:  HOLMIUM LASER APPLICATION;  Surgeon: Cleon Gustin, MD;  Location: [facility];  Service: Urology;  Laterality: Left;  . OPEN REDUCTION INTERNAL FIXATION (ORIF) DISTAL RADIAL FRACTURE Left 11/02/2014   Procedure: OPEN REDUCTION INTERNAL FIXATION (ORIF) DISTAL RADIAL FRACTURE;  Surgeon: Charlotte Crumb, MD;  Location: [place];  Service: Orthopedics;  Laterality: Left;  . PARTIAL MASTECTOMY WITH NEEDLE LOCALIZATION Left 06/26/2015   Procedure:  PARTIAL MASTECTOMY WITH NEEDLE LOCALIZATION;  Surgeon: Aviva Signs, MD;  [facility];  Service: General;  Laterality: Left;  Needle Loc @ 8:00am  . POLYPECTOMY  02/03/2017   Procedure: POLYPECTOMY;  Surgeon: Danie Binder, MD;  [facility];  Service: Endoscopy;;  descending and hepatic flexure  . TARSAL METATARSAL ARTHRODESIS Left 09-07-2007   dr Beola Cord  [facility]   first and second tarsal metatarsal fusion, gastroc slide, neurolysis  . TOTAL ABDOMINAL HYSTERECTOMY W/ BILATERAL SALPINGOOPHORECTOMY  10-21-2004   dr soper  [facility]    Family History  Problem Relation Age of Onset  . Hypertension Mother   . Osteoporosis Mother   . Diabetes Sister        x3  . Colon cancer Neg Hx    was reviewed  Social History Social History   Tobacco Use  . Smoking status: Former Smoker    Packs/day: 1.00    Years: 20.00    Pack years: 20.00    Types: Cigarettes    Quit date: 06/05/1986    Years since quitting: 33.2  . Smokeless tobacco: Never Used  Substance Use Topics  . Alcohol use: No  . Drug use: No    No Known Allergies  Current Outpatient Medications  Medication Sig Dispense Refill  . acetaminophen (TYLENOL 8 HOUR ARTHRITIS PAIN) 650 MG CR tablet Take 650 mg by mouth every 8 (eight) hours as needed for pain.    Marland Kitchen anastrozole (ARIMIDEX) 1 MG tablet Take 1 tablet (1 mg total) by mouth daily. 90 tablet 0  . calcium carbonate (TUMS EX) 750 MG chewable tablet Chew 1 tablet by mouth daily.    . Cholecalciferol (VITAMIN D3) 1000 units CAPS Take 1,000 Units by mouth at bedtime.    . Ferrous Sulfate (IRON) 325 (65 Fe) MG TABS Take by mouth every morning.    Marland Kitchen glucose blood (ONE TOUCH ULTRA TEST) test strip 1 each 2 (two) times daily. Check blood sugar 1-2 times daily    . glucose blood (PRECISION QID TEST) test strip 1 each 2 (two) times daily. Check Blood sugar 1-2 times daily    . losartan (COZAAR) 100 MG tablet Take 100 mg by mouth every morning.    . metFORMIN (GLUCOPHAGE) 1000  MG tablet Take 1,000 mg by mouth 2 (two) times daily with a meal.     . Multiple Vitamin (MULTIVITAMIN WITH MINERALS) TABS tablet Take 1 tablet by mouth every morning. Women's One A Day 50+     . Omega-3 Fatty Acids (FISH OIL) 1200 MG CAPS Take 1,000 mg by mouth 2 (two) times daily.     . simvastatin (ZOCOR) 40 MG tablet Take 40 mg by mouth at bedtime.     . fenofibrate 54 MG tablet Take 54 mg by mouth daily.     . meloxicam (MOBIC) 7.5 MG tablet Take 1 tablet (7.5 mg total) by mouth daily. 30 tablet 5  . omeprazole (PRILOSEC) 20 MG capsule Take 1 capsule (20 mg total) by mouth daily. 30 capsule 5   No current facility-administered medications for  this visit.        Physical Exam BP (!) 165/67   Pulse 91   Temp (!) 97.1 F (36.2 C)   Ht '5\' 5"'$  (1.651 m)   Wt 183 lb 3.2 oz (83.1 kg)   BMI 30.49 kg/m  Physical Exam Gen. appearance: The patient is well-developed and well-nourished grooming and hygiene are normal The patient is oriented to person place and time The patient's mood is normal and the affect is normal  Ortho Exam Gait assessment: The patient stands with normal gait and station  Lumbar spine Tenderness  to palpation is noted in the lower L4-5 and 5 S1 segment  Range of motion pain with extension decreased flexion Muscle tone normal muscle tone on the right and left sides of the spine  Lower extremities right and left Normal range of motion hip knee and ankle All 3 joints are reduced and stable  Strength right lower extremity 5 out of 5 Strength left lower extremity 5 out of 5  Neurologic right lower extremity examination  Reflexes were 2+ and equal at the knee and 1+ and equal at the ankle    Sensation was normal in both feet and legs    Babinski's tests were down going  Straight leg raise testing was abnormal on the right at 30 degrees but normal on the left   She also had pain with internal rotation of the right hip none on the left  The vascular  examination revealed normal dorsalis pedis pulses in both feet and both feet were warm with good capillary refill   [place]   Encounter Diagnoses  Name Primary?  . Primary osteoarthritis of right hip Yes  . Spondylolisthesis, lumbar region   . Other secondary scoliosis, lumbar region     Prior imaging studies have been reviewed and forwarded office visits and ER records if available have been reviewed.  Any x-ray readings by outside entities are included by reference.  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  Lumbar spine multiple views degenerative disc disease degenerative scoliosis spondylolisthesis  Reports:  CLINICAL DATA:  Sciatica, low back pain, right hip pain   EXAM: DG HIP (WITH OR WITHOUT PELVIS) 2-3V RIGHT   COMPARISON:  None.   FINDINGS: Mild degenerative changes in the right hip with joint space narrowing and spurring. Left hip is maintained. SI joints symmetric and unremarkable. No acute bony abnormality. Specifically, no fracture, subluxation, or dislocation.   IMPRESSION: Mild degenerative changes in the right hip. No acute bony abnormality.     Electronically Signed   By: Rolm Baptise M.D.   On: 07/27/2019 22:17 CLINICAL DATA:  Sciatica, right side, low back pain down right hip   EXAM: LUMBAR SPINE - COMPLETE 4+ VIEW   COMPARISON:  CT renal colic April 28, 2457   FINDINGS: Five lumbar type vertebral bodies. Rudimentary ribs noted at the T12 level better appreciated on CT April 28, 2018.   No acute fracture or traumatic listhesis. Mild levocurvature of the lumbar spine. Mild retrolisthesis of L1 on L2 of approximately 3 mm. Grade 1 anterolisthesis of L4 on L5 of approximately 9 mm. Neither listhesis is significantly exacerbated on  flexion or extension views to suggest dynamic instability. Multilevel discogenic and facet degenerative changes are present throughout the spine not  significantly changed from comparison.   Atherosclerotic calcification is present in the aorta. Coarse calcifications overlie both renal shadows.   IMPRESSION: 1. Grade 1 anterolisthesis of L4 on L5 without evidence of dynamic instability. 2. Rudimentary ribs noted at the T12 level better appreciated on CT April 28, 2018. 3. Bilateral nephrolithiasis. 4.  Aortic Atherosclerosis (ICD10-I70.0).     Electronically Signed   By: Lovena Le M.D.   On: 07/27/2019 22:17     Encounter Diagnoses  Name Primary?  . Primary osteoarthritis of right hip Yes  . Spondylolisthesis, lumbar region   . Other secondary scoliosis, lumbar region      PLAN:   Meds ordered this encounter  Medications  . meloxicam (MOBIC) 7.5 MG tablet    Sig: Take 1 tablet (7.5 mg total) by mouth daily.    Dispense:  30 tablet    Refill:  5  . omeprazole (PRILOSEC) 20 MG capsule    Sig: Take 1 capsule (20 mg total) by mouth daily.    Dispense:  30 capsule    Refill:  5   We have recommended meloxicam and Prilosec secondary history of GERD  Physical therapy  Follow-up in 4 weeks

## 2019-08-23 NOTE — Addendum Note (Signed)
Addended byCandice Camp on: 08/23/2019 10:46 AM   Modules accepted: Orders

## 2019-08-23 NOTE — Patient Instructions (Signed)
Spondylolisthesis  Spondylolisthesis is when one of the bones in the spine (vertebra) slips forward and out of place. This commonly occurs in the lower back (lumbar spine), but it can happen anywhere along the spine. What are the causes? This condition may be caused by:  A break or crack (stress fracture) in a bone in the spine from doing sports or physical activities that: ? Put a lot of strain on the bones in the lower back. ? Involve repetitive overstretching (hyperextension) of the spine.  Injury (trauma) from an accident.  Wear and tear that happens as a person grows older. What increases the risk? The following factors may make you more likely to develop this condition:  Participating in sports or activities such as: ? Gymnastics. ? Figure skating. ? Weight lifting. ? Football.  Having a condition that affects the bones, such as osteoarthritis or cancer.  Being overweight. What are the signs or symptoms? Symptoms of this condition may include:  Mild to severe pain in the legs, lower back, or buttocks.  An abnormal way of walking (abnormal gait).  Poor posture.  Muscle stiffness, specifically in the hamstrings. The hamstrings are in the backs of the thighs.  Weakness, numbness, or a tingling sensation in the legs.  Neck pain, if the injury is at the top of the spine. Symptoms may get worse when standing, and they may temporarily get better when sitting down or bending forward. In some cases, there may be no symptoms of this condition. How is this diagnosed? This condition may be diagnosed based on:  Your symptoms.  Your medical history.  A physical exam.  Imaging tests, such as: ? X-rays. ? CT scan. ? MRI. How is this treated? This condition may be treated by:  Resting.  Pain medicines.  NSAIDs, like ibuprofen, to help reduce swelling and discomfort.  Injections of medicine (cortisone) in your back. These injections can help to relieve pain and  numbness.  A brace to stabilize and support your back.  Physical therapy. You may work with an occupational therapist or physical therapist who can teach you how to reduce pressure on your back while you do everyday activities.  Surgery. This may be needed if: ? Other treatment methods do not improve your condition. ? Your symptoms do not go away after 3-6 months. ? You have changes in control of your stool or urine. ? You are unable to walk or stand. ? You have severe pain. Follow these instructions at home: Medicines  Take over-the-counter and prescription medicines only as told by your health care provider.  Ask your health care provider if the medicine prescribed to you: ? Requires you to avoid driving or using heavy machinery. ? Can cause constipation. You may need to take these actions to prevent or treat constipation:  Drink enough fluid to keep your urine pale yellow.  Take over-the-counter or prescription medicines.  Eat foods that are high in fiber, such as beans, whole grains, and fresh fruits and vegetables.  Limit foods that are high in fat and processed sugars, such as fried or sweet foods. If you have a brace:  Wear the brace as told by your health care provider. Remove it only as told by your health care provider.  Keep the brace clean.  If the brace is not waterproof: ? Do not let it get wet. ? Cover it with a watertight covering when you take a bath or a shower. Activity  Rest and return to your normal activities  as told by your health care provider. Ask your health care provider what activities are safe for you.  Ask your health care provider when it is safe to drive if you have a back brace.  Work with a physical therapist to make a safe exercise program, as recommended by your health care provider. Do exercises as told by your physical therapist. This may include exercises to strengthen your back and abdominal muscles (core exercises). Managing pain,  stiffness, and swelling      If directed, put ice on the affected area. ? If you have a removable brace, remove it as told by your health care provider. ? Put ice in a plastic bag. ? Place a towel between your skin and the bag. ? Leave the ice on for 20 minutes, 2-3 times a day.  If directed, apply heat to the affected area as often as told by your health care provider. Use the heat source that your health care provider recommends, such as a moist heat pack or a heating pad. ? If you have a removable brace, remove it as told by your health care provider. ? Place a towel between your skin and the heat source. ? Leave the heat on for 20-30 minutes. ? Remove the heat if your skin turns bright red. This is especially important if you are unable to feel pain, heat, or cold. You may have a greater risk of getting burned. General instructions  Do not use any products that contain nicotine or tobacco, such as cigarettes, e-cigarettes, and chewing tobacco. These can delay bone healing. If you need help quitting, ask your health care provider.  If you are overweight, work with your health care provider and a dietitian to set a weight-loss goal that is healthy and reasonable for you.  Keep all follow-up visits as told by your health care provider. This is important. Contact a health care provider if:  You have pain that gets worse or does not get better. Get help right away if:  You have severe back or neck pain.  You have changes in control of your stool or urine.  You develop weakness or numbness in your legs.  You are unable to stand or walk. Summary  Spondylolisthesis is when one of the bones in the spine (vertebra) slips forward and out of place.  This condition may be treated with rest, medicines, wearing a brace, physical therapy, or surgery.  Rest and return to your normal activities as told by your health care provider. Ask your health care provider what activities are safe for  you.  Contact a health care provider if you have pain that gets worse or does not get better. This information is not intended to replace advice given to you by your health care provider. Make sure you discuss any questions you have with your health care provider. Document Released: 11/02/2005 Document Revised: 02/23/2019 Document Reviewed: 06/07/2018 Elsevier Patient Education  [place].  Acute Back Pain, Adult Acute back pain is sudden and usually short-lived. It is often caused by an injury to the muscles and tissues in the back. The injury may result from:  A muscle or ligament getting overstretched or torn (strained). Ligaments are tissues that connect bones to each other. Lifting something improperly can cause a back strain.  Wear and tear (degeneration) of the spinal disks. Spinal disks are circular tissue that provides cushioning between the bones of the spine (vertebrae).  Twisting motions, such as while playing sports or doing  yard work.  A hit to the back.  Arthritis. You may have a physical exam, lab tests, and imaging tests to find the cause of your pain. Acute back pain usually goes away with rest and home care. Follow these instructions at home: Managing pain, stiffness, and swelling  Take over-the-counter and prescription medicines only as told by your health care provider.  Your health care provider may recommend applying ice during the first 24-48 hours after your pain starts. To do this: ? Put ice in a plastic bag. ? Place a towel between your skin and the bag. ? Leave the ice on for 20 minutes, 2-3 times a day.  If directed, apply heat to the affected area as often as told by your health care provider. Use the heat source that your health care provider recommends, such as a moist heat pack or a heating pad. ? Place a towel between your skin and the heat source. ? Leave the heat on for 20-30 minutes. ? Remove the heat if your skin turns bright red. This  is especially important if you are unable to feel pain, heat, or cold. You have a greater risk of getting burned. Activity   Do not stay in bed. Staying in bed for more than 1-2 days can delay your recovery.  Sit up and stand up straight. Avoid leaning forward when you sit, or hunching over when you stand. ? If you work at a desk, sit close to it so you do not need to lean over. Keep your chin tucked in. Keep your neck drawn back, and keep your elbows bent at a right angle. Your arms should look like the letter "L." ? Sit high and close to the steering wheel when you drive. Add lower back (lumbar) support to your car seat, if needed.  Take short walks on even surfaces as soon as you are able. Try to increase the length of time you walk each day.  Do not sit, drive, or stand in one place for more than 30 minutes at a time. Sitting or standing for long periods of time can put stress on your back.  Do not drive or use heavy machinery while taking prescription pain medicine.  Use proper lifting techniques. When you bend and lift, use positions that put less stress on your back: ? [place] your knees. ? Keep the load close to your body. ? Avoid twisting.  Exercise regularly as told by your health care provider. Exercising helps your back heal faster and helps prevent back injuries by keeping muscles strong and flexible.  Work with a physical therapist to make a safe exercise program, as recommended by your health care provider. Do any exercises as told by your physical therapist. Lifestyle  Maintain a healthy weight. Extra weight puts stress on your back and makes it difficult to have good posture.  Avoid activities or situations that make you feel anxious or stressed. Stress and anxiety increase muscle tension and can make back pain worse. Learn ways to manage anxiety and stress, such as through exercise. General instructions  Sleep on a firm mattress in a comfortable position. Try lying on  your side with your knees slightly bent. If you lie on your back, put a pillow under your knees.  Follow your treatment plan as told by your health care provider. This may include: ? Cognitive or behavioral therapy. ? Acupuncture or massage therapy. ? Meditation or yoga. Contact a health care provider if:  You have pain that  is not relieved with rest or medicine.  You have increasing pain going down into your legs or buttocks.  Your pain does not improve after 2 weeks.  You have pain at night.  You lose weight without trying.  You have a fever or chills. Get help right away if:  You develop new bowel or bladder control problems.  You have unusual weakness or numbness in your arms or legs.  You develop nausea or vomiting.  You develop abdominal pain.  You feel faint. Summary  Acute back pain is sudden and usually short-lived.  Use proper lifting techniques. When you bend and lift, use positions that put less stress on your back.  Take over-the-counter and prescription medicines and apply heat or ice as directed by your health care provider. This information is not intended to replace advice given to you by your health care provider. Make sure you discuss any questions you have with your health care provider. Document Released: 11/02/2005 Document Revised: 02/21/2019 Document Reviewed: 06/16/2017 Elsevier Patient Education  2020 [employer].

## 2019-08-24 ENCOUNTER — Encounter: Payer: Self-pay | Admitting: Physical Therapy

## 2019-08-24 ENCOUNTER — Other Ambulatory Visit: Payer: Self-pay

## 2019-08-24 ENCOUNTER — Ambulatory Visit: Payer: PPO | Attending: Orthopedic Surgery | Admitting: Physical Therapy

## 2019-08-24 DIAGNOSIS — R293 Abnormal posture: Secondary | ICD-10-CM | POA: Diagnosis not present

## 2019-08-24 DIAGNOSIS — M5441 Lumbago with sciatica, right side: Secondary | ICD-10-CM | POA: Insufficient documentation

## 2019-08-24 NOTE — Therapy (Addendum)
[facility] [place], [place], [address] Phone: [phone]   Fax:  [phone]  Physical Therapy Evaluation  Patient Details  Name: [name] MRN: [id number] Date of Birth: [birth year] Referring Provider (PT): Arther Abbott MD   Encounter Date: 08/24/2019  PT End of Session - 08/24/19 1157    Visit Number  1    Number of Visits  12    Date for PT Re-Evaluation  11/22/19    Authorization Type  FOTO AT LEAST EVERY 5TH VISIT.  PROGRESS NOTE AT 10TH VISIT.  KX MODIFIER AFTER 15 VISITS.    PT Start Time  0901    PT Stop Time  [phone]    PT Time Calculation (min)  42 min    Activity Tolerance  Patient tolerated treatment well    Behavior During Therapy  [facility] for tasks assessed/performed       Past Medical History:  Diagnosis Date  . Abdominal hernia    per CT 04-28-2018  abdominal/ pelvic anterior wall hernia  . Arthritis   . CKD (chronic kidney disease), stage III   . Disorder of bone and cartilage, unspecified   . Essential hypertension, benign   . Full dentures   . GERD (gastroesophageal reflux disease)   . 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 History of kidney stones   . History of radiation therapy 09-10-2015 to [phone]   left breast 50Gy  . Iron deficiency anemia   . Malignant neoplasm of lower-inner quadrant of left breast in female, estrogen receptor positive [facility]) oncologist-  dr Mathis Dad higgs ([facility]   dx 08-16-2015--  Stage 1A invasisive (T1c,N0,M0), ER/PR+, HER2 negative---- 06-26-2015 left partial mastectomy , 07-31-2015 left axilla node dissection x1 (negative)-- completed radiation 10-08-2015 and started arimidex 10-16-2015  . Mixed hyperlipidemia   . Nephrolithiasis    bilateral per CT 04-28-2018  . Type 2 diabetes mellitus ([place])    followed by pcp  . Ureteral calculi    left  . Urgency of urination   . Wears glasses     Past Surgical  History:  Procedure Laterality Date  . AXILLARY SENTINEL NODE BIOPSY Left 07/31/2015   Procedure: SENTINEL LYMPH NODE BIOPSY, LEFT AXILLA, ;  Surgeon: Aviva Signs, MD;  [facility];  Service: General;  Laterality: Left;  Sentinel Node @ 1000  . BREAST BIOPSY Left 06/09/2016   Procedure: LEFT BREAST BIOPSY AFTER NEEDLE LOCALIZATION;  Surgeon: Aviva Signs, MD;  [facility]: [facility];  Service: General;  Laterality: Left;  . CARPAL TUNNEL RELEASE Left 11/02/2014   Procedure: CARPAL TUNNEL RELEASE;  Surgeon: Charlotte Crumb, MD;  Location: [place];  Service: Orthopedics;  Laterality: Left;  . CATARACT EXTRACTION W/ INTRAOCULAR LENS  IMPLANT, BILATERAL  2007  . COLONOSCOPY N/A 02/03/2017   Procedure: COLONOSCOPY;  Surgeon: Danie Binder, MD;  [facility];  Service: Endoscopy;  Laterality: N/A;  1245  . COLONOSCOPY W/ BIOPSIES AND POLYPECTOMY    . CYSTOSCOPY WITH RETROGRADE PYELOGRAM, URETEROSCOPY AND STENT PLACEMENT Left 05/09/2018   Procedure: CYSTOSCOPY WITH RETROGRADE PYELOGRAM, URETEROSCOPY AND STENT PLACEMENT;  Surgeon: Cleon Gustin, MD;  Location: [facility];  Service: Urology;  Laterality: Left;  . CYSTOSCOPY/URETEROSCOPY/HOLMIUM LASER/STENT PLACEMENT Bilateral 03/18/2017   Procedure: CYSTOSCOPY/ BILATERAL RETROGRADE/RIGHT URETEROSCOPY/ RIGHT HOLMIUM LASER APPLICATION/RIGHT URETERAL STENT PLACEMENT;  Surgeon: Irine Seal, MD;  [facility];  Service: Urology;  Laterality: Bilateral;  .  HOLMIUM LASER APPLICATION Left 1/97/5883   Procedure: HOLMIUM LASER APPLICATION;  Surgeon: Cleon Gustin, MD;  Location: [facility];  Service: Urology;  Laterality: Left;  . OPEN REDUCTION INTERNAL FIXATION (ORIF) DISTAL RADIAL FRACTURE Left 11/02/2014   Procedure: OPEN REDUCTION INTERNAL FIXATION (ORIF) DISTAL RADIAL FRACTURE;  Surgeon: Charlotte Crumb, MD;  Location: [place];  Service: Orthopedics;  Laterality: Left;  . PARTIAL MASTECTOMY WITH NEEDLE  LOCALIZATION Left 06/26/2015   Procedure: PARTIAL MASTECTOMY WITH NEEDLE LOCALIZATION;  Surgeon: Aviva Signs, MD;  [facility];  Service: General;  Laterality: Left;  Needle Loc @ 8:00am  . POLYPECTOMY  02/03/2017   Procedure: POLYPECTOMY;  Surgeon: Danie Binder, MD;  [facility];  Service: Endoscopy;;  descending and hepatic flexure  . TARSAL METATARSAL ARTHRODESIS Left 09-07-2007   dr Beola Cord  [facility]   first and second tarsal metatarsal fusion, gastroc slide, neurolysis  . TOTAL ABDOMINAL HYSTERECTOMY W/ BILATERAL SALPINGOOPHORECTOMY  10-21-2004   dr soper  [facility]    There were no vitals filed for this visit.   Subjective Assessment - 08/24/19 0951    Subjective  COVID-19 screen performed prior to patient entering clinic.  The patient reports ongoing and worsening low back pain.  She also reports some numbness over her right lateral thigh and calf pain.  Her pain increases with walking and feels better with heat.    Pertinent History  HTN, DM, left wrist and foot surgery, GERD.    Limitations  Walking    How long can you walk comfortably?  Short community distances.    Diagnostic tests  X-ray.    Patient Stated Goals  Get out of pain.    Currently in Pain?  Yes    Pain Score  8     Pain Location  Back    Pain Orientation  Right    Pain Descriptors / Indicators  Aching    Pain Type  Acute pain    Pain Onset  More than a month ago    Pain Frequency  Constant    Aggravating Factors   See above.    Pain Relieving Factors  See above.         [facility] PT Assessment - 08/24/19 0001      Assessment   Medical Diagnosis  Spondylolisthesis, lumbar region.    Referring Provider (PT)  Arther Abbott MD    Onset Date/Surgical Date  --   May 2020     Precautions   Precautions  None      Restrictions   Weight Bearing Restrictions  No      Balance Screen   Has the patient fallen in the past 6 months  No    Has the patient had a decrease in activity level because of a  fear of falling?   Yes    Is the patient reluctant to leave their home because of a fear of falling?   No      Home Environment   Living Environment  Private residence      Prior Function   Level of Independence  Independent      Observation/Other Assessments   Focus on Therapeutic Outcomes (FOTO)   72% limitation.      Posture/Postural Control   Posture/Postural Control  Postural limitations    Postural Limitations  Rounded Shoulders;Forward head;Decreased lumbar lordosis;Flexed trunk      Deep Tendon Reflexes   DTR Assessment Site  Patella;Achilles    Patella DTR  2+    Achilles DTR  1+      ROM / Strength   AROM / PROM / Strength  AROM;Strength      AROM   Overall AROM Comments  Lumbar flexion decreased by 25% and extension to 15 degrees.      Strength   Overall Strength Comments  Normal LE strength.      Palpation   Palpation comment  Some tenderness to palption right of L4-5 and very tender to palpation over her right glut/Piriformis region.      Special Tests   Other special tests  (+) right SLR and right Slump test.  Right lower extremity longer.      Ambulation/Gait   Gait Comments  Very antalgic gait pattern.                Objective measurements completed on examination: See above findings.      [facility] Adult PT Treatment/Exercise - 08/24/19 0001      Modalities   Modalities  Electrical Stimulation;Moist Heat      Moist Heat Therapy   Number Minutes Moist Heat  15 Minutes    Moist Heat Location  --   Right low back.     Acupuncturist Location  Right buttock.    Electrical Stimulation Action  Pre-mod.    Electrical Stimulation Parameters  80-150 Hz x 15 minutes.    Electrical Stimulation Goals  Tone;Pain               PT Short Term Goals - 08/24/19 1200      PT SHORT TERM GOAL #1   Title  STG's=LTG's.        PT Long Term Goals - 08/24/19 1200      PT LONG TERM GOAL #1   Title   Independent with a HEP.    Time  6    Period  Weeks    Status  New      PT LONG TERM GOAL #2   Title  Walk a community distance wiht pain not > 3/10.    Time  6    Period  Weeks    Status  New      PT LONG TERM GOAL #3   Title  Eliminate right LE symptoms.    Time  6    Period  Weeks    Status  New             Plan - 08/24/19 1155    Comorbidities  HTN, DM, left wrist and foot surgery, GERD.    Examination-Activity Limitations  Locomotion Level;Stand    Examination-Participation Restrictions  Yard Work    Stability/Clinical Decision Making  Evolving/Moderate complexity       Patient will benefit from skilled therapeutic intervention in order to improve the following deficits and impairments:  Pain, Abnormal gait, Decreased activity tolerance, Decreased range of motion  Visit Diagnosis: Acute right-sided low back pain with right-sided sciatica - Plan: PT plan of care cert/re-cert  Abnormal posture - Plan: PT plan of care cert/re-cert     Problem List Patient Active Problem List   Diagnosis Date Noted  . Special screening for malignant neoplasms, colon   . Actinic keratosis 01/12/2016  . Benign essential hypertension 01/12/2016  . Gastroesophageal reflux disease without esophagitis 01/12/2016  . Mixed hyperlipidemia 01/12/2016  . Osteopenia 01/12/2016  . Seborrheic keratoses 01/12/2016  . 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 ([place]) 10/16/2015  .  Nonspecific abnormal finding in stool contents 01/11/2012  . Diabetes mellitus without mention of complication 17/51/0258    Kaidan Harpster, [place] MPT 08/24/2019, 12:20 PM  [facility] [address] [place], [place], [address] Phone: [phone]   Fax:  [phone]  Name: LUDELL ZACARIAS MRN: [id number] Date of Birth: [birth year]

## 2019-08-30 ENCOUNTER — Ambulatory Visit: Payer: PPO | Admitting: Physical Therapy

## 2019-08-30 ENCOUNTER — Other Ambulatory Visit: Payer: Self-pay

## 2019-08-30 DIAGNOSIS — M5441 Lumbago with sciatica, right side: Secondary | ICD-10-CM

## 2019-08-30 DIAGNOSIS — R293 Abnormal posture: Secondary | ICD-10-CM

## 2019-08-30 NOTE — Patient Instructions (Signed)
[facility]  DRY NEEDLING CONSENT FORM   Trigger point dry needling is a physical therapy approach to treat Myofascial Pain and Dysfunction.  Dry Needling (DN) is a valuable and effective way to deactivate myofascial trigger points (muscle knots/pain). It is skilled intervention that uses a thin filiform needle to penetrate the skin and stimulate underlying myofascial trigger points, muscular, and connective tissues for the management of neuromusculoskeletal pain and movement impairments.  A local twitch response (LTR) will be elicited.  This can sometimes feel like a deep ache in the muscle during the procedure. Multiple trigger points in multiple muscles can be treated during each treatment.  No medication of any kind is injected.   As with any medical treatment and procedure, there are possible adverse events.  While significant adverse events are uncommon, they do sometimes occur and must be considered prior to giving consent.  1. Dry needling often causes a "post needling soreness".  There can be an increase in pain from a couple of hours to 2-3 days, followed by an improvement in the overall pain state. 2. Any time a needle is used there is a risk of infection.  However, we are using new, sterile, and disposable needles; infections are extremely rare. 3. There is a possibility that you may bleed or bruise.  You may feel tired and some nausea following treatment. 4. There is a rare possibility of a pneumothorax (air in the chest cavity). 5. Allergic reaction to nickel in the stainless steel needle. 6. If a nerve is touched, it may cause paresthesia (a prickling/shock sensation) which is usually brief, but may continue for a couple of days.  Following treatment stay hydrated.  Continue regular activities but not too vigorous initially after treatment for 24-48 hours.  Dry Needling is best when combined with other physical therapy interventions such as  strengthening, stretching and other therapeutic modalities.   PLEASE ANSWER THE FOLLOWING QUESTIONS:  Do you have a lack of sensation?   Y/N  Do you have a phobia or fear of needles  Y/N  Are you pregnant?    Y/N If yes:  How many weeks? __________ Do you have any implanted devices?  Y/N If yes:  Pacemaker/Spinal Cord Stimulator/Deep Brain Stimulator/Insulin Pump/Other: ________________ Do you have any implants?  Y/N If yes: Breast/Facial/Pecs/Buttocks/Calves/Hip  Replacement/ Knee Replacement/Other: _________ Do you take any blood thinners?   Y/N If yes: Coumadin (Warfarin)/Other: ___________________ Do you have a bleeding disorder?   Y/N If yes: What kind: _________________________________ Do you take any immunosuppressants?  Y/N If yes:   What kind: _________________________________ Do you take anti-inflammatories?   Y/N If yes: What kind: Advil/Aspirin/Other: ________________ Have you ever been diagnosed with Scoliosis? Y/N Have you had back surgery?   Y/N If yes:  Laminectomy/Fusion/Other: ___________________   I have read, or had read to me, the above.  I have had the opportunity to ask any questions.  All of my questions have been answered to my satisfaction and I understand the risks involved with dry needling.  I consent to examination and treatment at [facility], including dry needling, of any and all of my involved and affected muscles.  

## 2019-08-30 NOTE — Therapy (Signed)
[facility] [place], [place], [address] Phone: [phone]   Fax:  [phone]  Physical Therapy Treatment  Patient Details  Name: [name] MRN: [id number] Date of Birth: 08/06/1946 Referring Provider (PT): Arther Abbott MD   Encounter Date: 08/30/2019  PT End of Session - 08/30/19 1223    Visit Number  2    Number of Visits  12    Date for PT Re-Evaluation  11/22/19    Authorization Type  FOTO AT LEAST EVERY 5TH VISIT.  PROGRESS NOTE AT 10TH VISIT.  KX MODIFIER AFTER 15 VISITS.    PT Start Time  0900    PT Stop Time  0957    PT Time Calculation (min)  57 min    Activity Tolerance  Patient tolerated treatment well    Behavior During Therapy  [facility] for tasks assessed/performed       Past Medical History:  Diagnosis Date  . Abdominal hernia    per CT 04-28-2018  abdominal/ pelvic anterior wall hernia  . Arthritis   . CKD (chronic kidney disease), stage III   . Disorder of bone and cartilage, unspecified   . Essential hypertension, benign   . Full dentures   . GERD (gastroesophageal reflux disease)   . 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 History of kidney stones   . History of radiation therapy 09-10-2015 to [phone]   left breast 50Gy  . Iron deficiency anemia   . Malignant neoplasm of lower-inner quadrant of left breast in female, estrogen receptor positive [facility]) oncologist-  dr Mathis Dad higgs ([facility]   dx 08-16-2015--  Stage 1A invasisive (T1c,N0,M0), ER/PR+, HER2 negative---- 06-26-2015 left partial mastectomy , 07-31-2015 left axilla node dissection x1 (negative)-- completed radiation 10-08-2015 and started arimidex 10-16-2015  . Mixed hyperlipidemia   . Nephrolithiasis    bilateral per CT 04-28-2018  . Type 2 diabetes mellitus ([place])    followed by pcp  . Ureteral calculi    left  . Urgency of urination   . Wears glasses     Past Surgical  History:  Procedure Laterality Date  . AXILLARY SENTINEL NODE BIOPSY Left 07/31/2015   Procedure: SENTINEL LYMPH NODE BIOPSY, LEFT AXILLA, ;  Surgeon: Aviva Signs, MD;  [facility]: [facility];  Service: General;  Laterality: Left;  Sentinel Node @ 1000  . BREAST BIOPSY Left 06/09/2016   Procedure: LEFT BREAST BIOPSY AFTER NEEDLE LOCALIZATION;  Surgeon: Aviva Signs, MD;  [facility];  Service: General;  Laterality: Left;  . CARPAL TUNNEL RELEASE Left 11/02/2014   Procedure: CARPAL TUNNEL RELEASE;  Surgeon: Charlotte Crumb, MD;  Location: [place];  Service: Orthopedics;  Laterality: Left;  . CATARACT EXTRACTION W/ INTRAOCULAR LENS  IMPLANT, BILATERAL  2007  . COLONOSCOPY N/A 02/03/2017   Procedure: COLONOSCOPY;  Surgeon: Danie Binder, MD;  [facility];  Service: Endoscopy;  Laterality: N/A;  1245  . COLONOSCOPY W/ BIOPSIES AND POLYPECTOMY    . CYSTOSCOPY WITH RETROGRADE PYELOGRAM, URETEROSCOPY AND STENT PLACEMENT Left 05/09/2018   Procedure: CYSTOSCOPY WITH RETROGRADE PYELOGRAM, URETEROSCOPY AND STENT PLACEMENT;  Surgeon: Cleon Gustin, MD;  Location: [facility];  Service: Urology;  Laterality: Left;  . CYSTOSCOPY/URETEROSCOPY/HOLMIUM LASER/STENT PLACEMENT Bilateral 03/18/2017   Procedure: CYSTOSCOPY/ BILATERAL RETROGRADE/RIGHT URETEROSCOPY/ RIGHT HOLMIUM LASER APPLICATION/RIGHT URETERAL STENT PLACEMENT;  Surgeon: Irine Seal, MD;  [facility];  Service: Urology;  Laterality: Bilateral;  .  HOLMIUM LASER APPLICATION Left 1/69/6789   Procedure: HOLMIUM LASER APPLICATION;  Surgeon: Cleon Gustin, MD;  Location: [facility];  Service: Urology;  Laterality: Left;  . OPEN REDUCTION INTERNAL FIXATION (ORIF) DISTAL RADIAL FRACTURE Left 11/02/2014   Procedure: OPEN REDUCTION INTERNAL FIXATION (ORIF) DISTAL RADIAL FRACTURE;  Surgeon: Charlotte Crumb, MD;  Location: [place];  Service: Orthopedics;  Laterality: Left;  . PARTIAL MASTECTOMY WITH NEEDLE  LOCALIZATION Left 06/26/2015   Procedure: PARTIAL MASTECTOMY WITH NEEDLE LOCALIZATION;  Surgeon: Aviva Signs, MD;  [facility];  Service: General;  Laterality: Left;  Needle Loc @ 8:00am  . POLYPECTOMY  02/03/2017   Procedure: POLYPECTOMY;  Surgeon: Danie Binder, MD;  [facility];  Service: Endoscopy;;  descending and hepatic flexure  . TARSAL METATARSAL ARTHRODESIS Left 09-07-2007   dr Beola Cord  [facility]   first and second tarsal metatarsal fusion, gastroc slide, neurolysis  . TOTAL ABDOMINAL HYSTERECTOMY W/ BILATERAL SALPINGOOPHORECTOMY  10-21-2004   dr soper  [facility]    There were no vitals filed for this visit.  Subjective Assessment - 08/30/19 1014    Subjective  COVID-19 screen performed prior to patient entering clinic.  Thoses stretches you showed me are helping.    Pertinent History  HTN, DM, left wrist and foot surgery, GERD.    Limitations  Walking    How long can you walk comfortably?  Short community distances.    Diagnostic tests  X-ray.    Patient Stated Goals  Get out of pain.    Currently in Pain?  Yes    Pain Score  6     Pain Location  Back    Pain Orientation  Right    Pain Descriptors / Indicators  Aching    Pain Type  Acute pain    Pain Onset  More than a month ago                       [facility] Adult PT Treatment/Exercise - 08/30/19 0001      Modalities   Modalities  Electrical Stimulation;Moist Heat;Ultrasound      Moist Heat Therapy   Number Minutes Moist Heat  20 Minutes    Moist Heat Location  --   Right hip.     Acupuncturist Location  --   Right buttock.   Electrical Stimulation Action  Pre-mod.    Electrical Stimulation Parameters  80-150 Hz x 20 minutes.    Electrical Stimulation Goals  Tone;Pain      Ultrasound   Ultrasound Location  Right piriformis region.    Ultrasound Parameters  Patient in right sdly position with pillow between knees for comfort:  Combo e'stim/U/S at 1.50 W/CM2  x 12 minutes.      Manual Therapy   Manual Therapy  Soft tissue mobilization    Soft tissue mobilization  STW/M x 12 minutes to patient's left Piriformis region to reduce tone.               PT Short Term Goals - 08/24/19 1200      PT SHORT TERM GOAL #1   Title  STG's=LTG's.        PT Long Term Goals - 08/24/19 1200      PT LONG TERM GOAL #1   Title  Independent with a HEP.    Time  6    Period  Weeks    Status  New  PT LONG TERM GOAL #2   Title  Walk a community distance wiht pain not > 3/10.    Time  6    Period  Weeks    Status  New      PT LONG TERM GOAL #3   Title  Eliminate right LE symptoms.    Time  6    Period  Weeks    Status  New            Plan - 08/30/19 1215    Clinical Impression Statement  Patient already noting improvement.  She was tender over her right Piriformis and did well with dry needling.    Personal Factors and Comorbidities  Comorbidity 1;Comorbidity 2    Comorbidities  HTN, DM, left wrist and foot surgery, GERD.    Examination-Participation Restrictions  Yard Work    Stability/Clinical Decision Making  Evolving/Moderate complexity    Rehab Potential  Good    PT Frequency  2x / week    PT Duration  6 weeks    PT Treatment/Interventions  ADLs/Self Care Home Management;Cryotherapy;Electrical Stimulation;Ultrasound;Moist Heat;Therapeutic activities;Therapeutic exercise;Manual techniques;Patient/family education;Dry needling    PT Next Visit Plan  Combo e'stim/U/S and STW/M to right glut region, HMP/e'stim.  Core exercise progression.  SKTC, hip bridges and Piriformis stretch.    Consulted and Agree with Plan of Care  Patient       Patient will benefit from skilled therapeutic intervention in order to improve the following deficits and impairments:  Pain, Abnormal gait, Decreased activity tolerance, Decreased range of motion  Visit Diagnosis: Acute right-sided low back pain with right-sided sciatica  Abnormal  posture     Problem List Patient Active Problem List   Diagnosis Date Noted  . Special screening for malignant neoplasms, colon   . Actinic keratosis 01/12/2016  . Benign essential hypertension 01/12/2016  . Gastroesophageal reflux disease without esophagitis 01/12/2016  . Mixed hyperlipidemia 01/12/2016  . Osteopenia 01/12/2016  . Seborrheic keratoses 01/12/2016  . 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 ([place]) 10/16/2015  . Nonspecific abnormal finding in stool contents 01/11/2012  . Diabetes mellitus without mention of complication 27/74/1287    , [place] MPT 08/30/2019, 12:29 PM  [facility] [address] [place], [place], [address] Phone: [phone]   Fax:  [phone]  Name: [name] MRN: [id number] Date of Birth: Jan 11, 1946

## 2019-09-06 ENCOUNTER — Ambulatory Visit: Payer: PPO | Admitting: Physical Therapy

## 2019-09-06 ENCOUNTER — Other Ambulatory Visit: Payer: Self-pay

## 2019-09-06 ENCOUNTER — Encounter: Payer: Self-pay | Admitting: Physical Therapy

## 2019-09-06 DIAGNOSIS — M5441 Lumbago with sciatica, right side: Secondary | ICD-10-CM

## 2019-09-06 DIAGNOSIS — R293 Abnormal posture: Secondary | ICD-10-CM

## 2019-09-06 NOTE — Therapy (Signed)
[facility] [place], [place], [address] Phone: [phone]   Fax:  [phone]  Physical Therapy Treatment  Patient Details  Name: [name] MRN: [id number] Date of Birth: [birth year] Referring Provider (PT): Arther Abbott MD   Encounter Date: 09/06/2019  PT End of Session - 09/06/19 1233    Visit Number  3    Number of Visits  12    Date for PT Re-Evaluation  11/22/19    Authorization Type  FOTO AT LEAST EVERY 5TH VISIT.  PROGRESS NOTE AT 10TH VISIT.  KX MODIFIER AFTER 15 VISITS.    PT Start Time  0900    PT Stop Time  0958    PT Time Calculation (min)  58 min    Activity Tolerance  Patient tolerated treatment well    Behavior During Therapy  WFL for tasks assessed/performed       Past Medical History:  Diagnosis Date  . Abdominal hernia    per CT 04-28-2018  abdominal/ pelvic anterior wall hernia  . Arthritis   . CKD (chronic kidney disease), stage III   . Disorder of bone and cartilage, unspecified   . Essential hypertension, benign   . Full dentures   . GERD (gastroesophageal reflux disease)   . 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 History of kidney stones   . History of radiation therapy 09-10-2015 to [phone]   left breast 50Gy  . Iron deficiency anemia   . Malignant neoplasm of lower-inner quadrant of left breast in female, estrogen receptor positive [facility]) oncologist-  dr Mathis Dad higgs ([facility])   dx 08-16-2015--  Stage 1A invasisive (T1c,N0,M0), ER/PR+, HER2 negative---- 06-26-2015 left partial mastectomy , 07-31-2015 left axilla node dissection x1 (negative)-- completed radiation 10-08-2015 and started arimidex 10-16-2015  . Mixed hyperlipidemia   . Nephrolithiasis    bilateral per CT 04-28-2018  . Type 2 diabetes mellitus ([place])    followed by pcp  . Ureteral calculi    left  . Urgency of urination   . Wears glasses     Past Surgical  History:  Procedure Laterality Date  . AXILLARY SENTINEL NODE BIOPSY Left 07/31/2015   Procedure: SENTINEL LYMPH NODE BIOPSY, LEFT AXILLA, ;  Surgeon: Aviva Signs, MD;  [facility];  Service: General;  Laterality: Left;  Sentinel Node @ 1000  . BREAST BIOPSY Left 06/09/2016   Procedure: LEFT BREAST BIOPSY AFTER NEEDLE LOCALIZATION;  Surgeon: Aviva Signs, MD;  Location: [facility];  Service: General;  Laterality: Left;  . CARPAL TUNNEL RELEASE Left 11/02/2014   Procedure: CARPAL TUNNEL RELEASE;  Surgeon: Charlotte Crumb, MD;  Location: [place];  Service: Orthopedics;  Laterality: Left;  . CATARACT EXTRACTION W/ INTRAOCULAR LENS  IMPLANT, BILATERAL  2007  . COLONOSCOPY N/A 02/03/2017   Procedure: COLONOSCOPY;  Surgeon: Danie Binder, MD;  [facility];  Service: Endoscopy;  Laterality: N/A;  1245  . COLONOSCOPY W/ BIOPSIES AND POLYPECTOMY    . CYSTOSCOPY WITH RETROGRADE PYELOGRAM, URETEROSCOPY AND STENT PLACEMENT Left 05/09/2018   Procedure: CYSTOSCOPY WITH RETROGRADE PYELOGRAM, URETEROSCOPY AND STENT PLACEMENT;  Surgeon: Cleon Gustin, MD;  Location: [facility];  Service: Urology;  Laterality: Left;  . CYSTOSCOPY/URETEROSCOPY/HOLMIUM LASER/STENT PLACEMENT Bilateral 03/18/2017   Procedure: CYSTOSCOPY/ BILATERAL RETROGRADE/RIGHT URETEROSCOPY/ RIGHT HOLMIUM LASER APPLICATION/RIGHT URETERAL STENT PLACEMENT;  Surgeon: Irine Seal, MD;  [facility];  Service: Urology;  Laterality: Bilateral;  .  HOLMIUM LASER APPLICATION Left 6/41/5830   Procedure: HOLMIUM LASER APPLICATION;  Surgeon: Cleon Gustin, MD;  Location: [facility];  Service: Urology;  Laterality: Left;  . OPEN REDUCTION INTERNAL FIXATION (ORIF) DISTAL RADIAL FRACTURE Left 11/02/2014   Procedure: OPEN REDUCTION INTERNAL FIXATION (ORIF) DISTAL RADIAL FRACTURE;  Surgeon: Charlotte Crumb, MD;  Location: [place];  Service: Orthopedics;  Laterality: Left;  . PARTIAL MASTECTOMY WITH NEEDLE  LOCALIZATION Left 06/26/2015   Procedure: PARTIAL MASTECTOMY WITH NEEDLE LOCALIZATION;  Surgeon: Aviva Signs, MD;  [facility];  Service: General;  Laterality: Left;  Needle Loc @ 8:00am  . POLYPECTOMY  02/03/2017   Procedure: POLYPECTOMY;  Surgeon: Danie Binder, MD;  [facility];  Service: Endoscopy;;  descending and hepatic flexure  . TARSAL METATARSAL ARTHRODESIS Left 09-07-2007   dr Beola Cord  [facility]   first and second tarsal metatarsal fusion, gastroc slide, neurolysis  . TOTAL ABDOMINAL HYSTERECTOMY W/ BILATERAL SALPINGOOPHORECTOMY  10-21-2004   dr soper  [facility]    There were no vitals filed for this visit.  Subjective Assessment - 09/06/19 1213    Subjective  COVID-19 screen performed prior to patient entering clinic.  I felt so good after that last treatmetn I overdid it at home.  My pain is an 8 today.    Pertinent History  HTN, DM, left wrist and foot surgery, GERD.    Limitations  Walking    How long can you walk comfortably?  Short community distances.    Diagnostic tests  X-ray.    Patient Stated Goals  Get out of pain.    Currently in Pain?  Yes    Pain Score  8     Pain Location  Back    Pain Orientation  Right    Pain Descriptors / Indicators  Aching    Pain Onset  More than a month ago                       [facility] Adult PT Treatment/Exercise - 09/06/19 0001      Exercises   Exercises  Knee/Hip      Knee/Hip Exercises: Aerobic   Nustep  Level 3 x 10 minutes.      Modalities   Modalities  Electrical Stimulation;Ultrasound      Moist Heat Therapy   Number Minutes Moist Heat  20 Minutes    Moist Heat Location  --   RT hip.     Acupuncturist Location  --   RT buttock.   Electrical Stimulation Action  Pre-mod.    Electrical Stimulation Parameters  80-150 Hz x 20 minutes.    Electrical Stimulation Goals  Tone;Pain      Ultrasound   Ultrasound Location  RT Piriformis    Ultrasound Parameters   Combo e'stim/U/S at 1.50 W/CM2 x 8 minutes.    Ultrasound Goals  Pain      Manual Therapy   Manual Therapy  Soft tissue mobilization    Soft tissue mobilization  STW/M x 7 minutes to right piriformis region to reduce tone.               PT Short Term Goals - 08/24/19 1200      PT SHORT TERM GOAL #1   Title  STG's=LTG's.        PT Long Term Goals - 08/24/19 1200      PT LONG TERM GOAL #1   Title  Independent  with a HEP.    Time  6    Period  Weeks    Status  New      PT LONG TERM GOAL #2   Title  Walk a community distance wiht pain not > 3/10.    Time  6    Period  Weeks    Status  New      PT LONG TERM GOAL #3   Title  Eliminate right LE symptoms.    Time  6    Period  Weeks    Status  New            Plan - 09/06/19 1236    Clinical Impression Statement  Patient did well today after a recent flare-up due to a lot of housework.  She did great after her second treatment and then "overdid it."    Personal Factors and Comorbidities  Comorbidity 1;Comorbidity 2    Comorbidities  HTN, DM, left wrist and foot surgery, GERD.    Examination-Activity Limitations  Locomotion Level;Stand    Examination-Participation Restrictions  Yard Work       Patient will benefit from skilled therapeutic intervention in order to improve the following deficits and impairments:  Pain, Abnormal gait, Decreased activity tolerance, Decreased range of motion  Visit Diagnosis: Acute right-sided low back pain with right-sided sciatica  Abnormal posture     Problem List Patient Active Problem List   Diagnosis Date Noted  . Special screening for malignant neoplasms, colon   . Actinic keratosis 01/12/2016  . Benign essential hypertension 01/12/2016  . Gastroesophageal reflux disease without esophagitis 01/12/2016  . Mixed hyperlipidemia 01/12/2016  . Osteopenia 01/12/2016  . Seborrheic keratoses 01/12/2016  . 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  ([place]) 10/16/2015  . Nonspecific abnormal finding in stool contents 01/11/2012  . Diabetes mellitus without mention of complication 47/42/5956    [place], [place] MPT 09/06/2019, 1:06 PM  [facility] [place], [place], [address] Phone: [phone]   Fax:  [phone]  Name: [name] MRN: [id number] Date of Birth: 1946/02/01

## 2019-09-14 ENCOUNTER — Encounter: Payer: Self-pay | Admitting: Physical Therapy

## 2019-09-14 ENCOUNTER — Ambulatory Visit: Payer: PPO | Admitting: Physical Therapy

## 2019-09-14 ENCOUNTER — Other Ambulatory Visit: Payer: Self-pay

## 2019-09-14 DIAGNOSIS — M5441 Lumbago with sciatica, right side: Secondary | ICD-10-CM

## 2019-09-14 DIAGNOSIS — R293 Abnormal posture: Secondary | ICD-10-CM

## 2019-09-14 NOTE — Therapy (Addendum)
[facility] [place], [place], [address] Phone: [phone]   Fax:  [phone]  Physical Therapy Treatment  Patient Details  Name: [name] MRN: [id number] Date of Birth: May 17, 1946 Referring Provider (PT): Arther Abbott MD   Encounter Date: 09/14/2019  PT End of Session - 09/14/19 1214    Visit Number  4    Number of Visits  12    Date for PT Re-Evaluation  11/22/19    Authorization Type  FOTO AT LEAST EVERY 5TH VISIT.  PROGRESS NOTE AT 10TH VISIT.  KX MODIFIER AFTER 15 VISITS.    PT Start Time  [phone]    PT Stop Time  0958    PT Time Calculation (min)  59 min    Activity Tolerance  Patient tolerated treatment well    Behavior During Therapy  [facility] for tasks assessed/performed       Past Medical History:  Diagnosis Date  . Abdominal hernia    per CT 04-28-2018  abdominal/ pelvic anterior wall hernia  . Arthritis   . CKD (chronic kidney disease), stage III   . Disorder of bone and cartilage, unspecified   . Essential hypertension, benign   . Full dentures   . GERD (gastroesophageal reflux disease)   . 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 History of kidney stones   . History of radiation therapy 09-10-2015 to [phone]   left breast 50Gy  . Iron deficiency anemia   . Malignant neoplasm of lower-inner quadrant of left breast in female, estrogen receptor positive [facility]) oncologist-  dr Mathis Dad higgs ([facility])   dx 08-16-2015--  Stage 1A invasisive (T1c,N0,M0), ER/PR+, HER2 negative---- 06-26-2015 left partial mastectomy , 07-31-2015 left axilla node dissection x1 (negative)-- completed radiation 10-08-2015 and started arimidex 10-16-2015  . Mixed hyperlipidemia   . Nephrolithiasis    bilateral per CT 04-28-2018  . Type 2 diabetes mellitus ([place])    followed by pcp  . Ureteral calculi    left  . Urgency of urination   . Wears glasses     Past Surgical  History:  Procedure Laterality Date  . AXILLARY SENTINEL NODE BIOPSY Left 07/31/2015   Procedure: SENTINEL LYMPH NODE BIOPSY, LEFT AXILLA, ;  Surgeon: Aviva Signs, MD;  [facility];  Service: General;  Laterality: Left;  Sentinel Node @ 1000  . BREAST BIOPSY Left 06/09/2016   Procedure: LEFT BREAST BIOPSY AFTER NEEDLE LOCALIZATION;  Surgeon: Aviva Signs, MD;  [facility];  Service: General;  Laterality: Left;  . CARPAL TUNNEL RELEASE Left 11/02/2014   Procedure: CARPAL TUNNEL RELEASE;  Surgeon: Charlotte Crumb, MD;  Location: [place];  Service: Orthopedics;  Laterality: Left;  . CATARACT EXTRACTION W/ INTRAOCULAR LENS  IMPLANT, BILATERAL  2007  . COLONOSCOPY N/A 02/03/2017   Procedure: COLONOSCOPY;  Surgeon: Danie Binder, MD;  [facility];  Service: Endoscopy;  Laterality: N/A;  1245  . COLONOSCOPY W/ BIOPSIES AND POLYPECTOMY    . CYSTOSCOPY WITH RETROGRADE PYELOGRAM, URETEROSCOPY AND STENT PLACEMENT Left 05/09/2018   Procedure: CYSTOSCOPY WITH RETROGRADE PYELOGRAM, URETEROSCOPY AND STENT PLACEMENT;  Surgeon: Cleon Gustin, MD;  Location: [facility];  Service: Urology;  Laterality: Left;  . CYSTOSCOPY/URETEROSCOPY/HOLMIUM LASER/STENT PLACEMENT Bilateral 03/18/2017   Procedure: CYSTOSCOPY/ BILATERAL RETROGRADE/RIGHT URETEROSCOPY/ RIGHT HOLMIUM LASER APPLICATION/RIGHT URETERAL STENT PLACEMENT;  Surgeon: Irine Seal, MD;  [facility];  Service: Urology;  Laterality: Bilateral;  .  HOLMIUM LASER APPLICATION Left 2/70/6237   Procedure: HOLMIUM LASER APPLICATION;  Surgeon: Cleon Gustin, MD;  Location: [facility];  Service: Urology;  Laterality: Left;  . OPEN REDUCTION INTERNAL FIXATION (ORIF) DISTAL RADIAL FRACTURE Left 11/02/2014   Procedure: OPEN REDUCTION INTERNAL FIXATION (ORIF) DISTAL RADIAL FRACTURE;  Surgeon: Charlotte Crumb, MD;  Location: [place];  Service: Orthopedics;  Laterality: Left;  . PARTIAL MASTECTOMY WITH NEEDLE  LOCALIZATION Left 06/26/2015   Procedure: PARTIAL MASTECTOMY WITH NEEDLE LOCALIZATION;  Surgeon: Aviva Signs, MD;  [facility];  Service: General;  Laterality: Left;  Needle Loc @ 8:00am  . POLYPECTOMY  02/03/2017   Procedure: POLYPECTOMY;  Surgeon: Danie Binder, MD;  [facility];  Service: Endoscopy;;  descending and hepatic flexure  . TARSAL METATARSAL ARTHRODESIS Left 09-07-2007   dr Beola Cord  [facility]   first and second tarsal metatarsal fusion, gastroc slide, neurolysis  . TOTAL ABDOMINAL HYSTERECTOMY W/ BILATERAL SALPINGOOPHORECTOMY  10-21-2004   dr soper  [facility]    There were no vitals filed for this visit.  Subjective Assessment - 09/14/19 1056    Subjective  COVID-19 screen performed prior to patient entering clinic.  I didn't have pain for 5 days after that last treatment.    Pertinent History  HTN, DM, left wrist and foot surgery, GERD.    Limitations  Walking    How long can you walk comfortably?  Short community distances.    Diagnostic tests  X-ray.    Patient Stated Goals  Get out of pain.    Currently in Pain?  Yes    Pain Score  1     Pain Orientation  Right    Pain Descriptors / Indicators  Aching    Pain Type  Acute pain    Pain Onset  More than a month ago                       [facility] Adult PT Treatment/Exercise - 09/14/19 0001      Exercises   Exercises  Knee/Hip      Knee/Hip Exercises: Aerobic   Nustep  Level 5 x 10 minutes.      Modalities   Modalities  Electrical Stimulation;Moist Heat      Moist Heat Therapy   Number Minutes Moist Heat  20 Minutes      Electrical Stimulation   Electrical Stimulation Location  --   RT buttock.   Electrical Stimulation Action  Pre-mod.    Electrical Stimulation Parameters  80-150 Hz x 20 minutes.    Electrical Stimulation Goals  Tone;Pain      Manual Therapy   Manual Therapy  Soft tissue mobilization    Soft tissue mobilization  STW/M x 13 minutes.               PT Short  Term Goals - 08/24/19 1200      PT SHORT TERM GOAL #1   Title  STG's=LTG's.        PT Long Term Goals - 08/24/19 1200      PT LONG TERM GOAL #1   Title  Independent with a HEP.    Time  6    Period  Weeks    Status  New      PT LONG TERM GOAL #2   Title  Walk a community distance wiht pain not > 3/10.    Time  6    Period  Weeks  Status  New      PT LONG TERM GOAL #3   Title  Eliminate right LE symptoms.    Time  6    Period  Weeks    Status  New            Plan - 09/14/19 1213    Clinical Impression Statement  Patient doing very well and reported 5 days without pain after her last treatment.  She states that at home she was able to perform a reciprocating stair gait due to the decrease in pain.    Personal Factors and Comorbidities  Comorbidity 1;Comorbidity 2    Comorbidities  HTN, DM, left wrist and foot surgery, GERD.    Examination-Activity Limitations  Locomotion Level;Stand    Examination-Participation Restrictions  Yard Work    Stability/Clinical Decision Making  Evolving/Moderate complexity    Rehab Potential  Good    PT Frequency  2x / week    PT Duration  6 weeks    PT Treatment/Interventions  ADLs/Self Care Home Management;Cryotherapy;Electrical Stimulation;Ultrasound;Moist Heat;Therapeutic activities;Therapeutic exercise;Manual techniques;Patient/family education;Dry needling    PT Next Visit Plan  Combo e'stim/U/S and STW/M to right glut region, HMP/e'stim.  Core exercise progression.  SKTC, hip bridges and Piriformis stretch.    Consulted and Agree with Plan of Care  Patient       Patient will benefit from skilled therapeutic intervention in order to improve the following deficits and impairments:  Pain, Abnormal gait, Decreased activity tolerance, Decreased range of motion  Visit Diagnosis: Acute right-sided low back pain with right-sided sciatica  Abnormal posture     Problem List Patient Active Problem List   Diagnosis Date Noted  .  Special screening for malignant neoplasms, colon   . Actinic keratosis 01/12/2016  . Benign essential hypertension 01/12/2016  . Gastroesophageal reflux disease without esophagitis 01/12/2016  . Mixed hyperlipidemia 01/12/2016  . Osteopenia 01/12/2016  . Seborrheic keratoses 01/12/2016  . 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 ([place]) 10/16/2015  . Nonspecific abnormal finding in stool contents 01/11/2012  . Diabetes mellitus without mention of complication 10/93/2355   PHYSICAL THERAPY DISCHARGE SUMMARY  Visits from Start of Care: 4.  Current functional level related to goals / functional outcomes: See above   Remaining deficits: See below.   Education / Equipment: HEP.  Plan: Patient agrees to discharge.  Patient goals were not met. Patient is being discharged due to being pleased with the current functional level.  ?????      Ritchard Paragas, [place] MPT 09/14/2019, 12:16 PM  [facility] [address] [place], [place], [address] Phone: [phone]   Fax:  [phone]  Name: JANMARIE SMOOT MRN: [id number] Date of Birth: 08-Aug-1946

## 2019-09-16 ENCOUNTER — Other Ambulatory Visit (HOSPITAL_COMMUNITY): Payer: Self-pay | Admitting: Hematology

## 2019-09-16 DIAGNOSIS — Z170 Estrogen receptor positive status [ER+]: Secondary | ICD-10-CM

## 2019-09-16 DIAGNOSIS — C50312 Malignant neoplasm of lower-inner quadrant of left female breast: Secondary | ICD-10-CM

## 2019-09-20 ENCOUNTER — Ambulatory Visit (INDEPENDENT_AMBULATORY_CARE_PROVIDER_SITE_OTHER): Payer: PPO | Admitting: Orthopedic Surgery

## 2019-09-20 ENCOUNTER — Other Ambulatory Visit: Payer: Self-pay

## 2019-09-20 VITALS — BP 131/82 | HR 86 | Temp 97.7°F | Ht 65.0 in | Wt 183.0 lb

## 2019-09-20 DIAGNOSIS — M1611 Unilateral primary osteoarthritis, right hip: Secondary | ICD-10-CM | POA: Diagnosis not present

## 2019-09-20 DIAGNOSIS — M4316 Spondylolisthesis, lumbar region: Secondary | ICD-10-CM | POA: Diagnosis not present

## 2019-09-20 NOTE — Patient Instructions (Signed)
Home therapy   Call us if things get worse

## 2019-09-20 NOTE — Progress Notes (Signed)
Chief Complaint  Patient presents with  . Follow-up    Recheck on back pain.    [name] is doing well she went to therapy her pain went from a 10 to an 8 to a 1.  She is able to do activities of daily living  Exam shows no tenderness in the lower back she is walking without support there is no flexion of the spine to suggest any residual stenotic symptoms  Follow-up as needed continue home therapy

## 2019-11-08 ENCOUNTER — Other Ambulatory Visit: Payer: Self-pay | Admitting: Urology

## 2019-11-08 DIAGNOSIS — N200 Calculus of kidney: Secondary | ICD-10-CM | POA: Diagnosis not present

## 2019-11-08 DIAGNOSIS — R3121 Asymptomatic microscopic hematuria: Secondary | ICD-10-CM | POA: Diagnosis not present

## 2019-11-16 ENCOUNTER — Other Ambulatory Visit: Payer: Self-pay

## 2019-11-16 ENCOUNTER — Other Ambulatory Visit (HOSPITAL_COMMUNITY)
Admission: RE | Admit: 2019-11-16 | Discharge: 2019-11-16 | Disposition: A | Discharge status: Home / Self Care | Payer: PPO | Source: Ambulatory Visit | Attending: Urology | Admitting: Urology

## 2019-11-16 DIAGNOSIS — Z01812 Encounter for preprocedural laboratory examination: Secondary | ICD-10-CM | POA: Insufficient documentation

## 2019-11-16 DIAGNOSIS — U071 COVID-19: Secondary | ICD-10-CM | POA: Insufficient documentation

## 2019-11-16 LAB — SARS CORONAVIRUS 2 (TAT 6-24 HRS): SARS Coronavirus 2: POSITIVE — AB

## 2019-11-17 ENCOUNTER — Telehealth: Payer: Self-pay | Admitting: Nurse Practitioner

## 2019-11-17 NOTE — Telephone Encounter (Signed)
Called to Discuss with patient about Covid symptoms and the use of bamlanivimab, a monoclonal antibody infusion for those with mild to moderate Covid symptoms and at a high risk of hospitalization.     Pt is qualified for this infusion at the [facility] infusion center due to co-morbid conditions and/or a member of an at-risk group.     Patient Active Problem List   Diagnosis Date Noted  . Special screening for malignant neoplasms, colon   . Actinic keratosis 01/12/2016  . Benign essential hypertension 01/12/2016  . Gastroesophageal reflux disease without esophagitis 01/12/2016  . Mixed hyperlipidemia 01/12/2016  . Osteopenia 01/12/2016  . Seborrheic keratoses 01/12/2016  . 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 ([place]) 10/16/2015  . Nonspecific abnormal finding in stool contents 01/11/2012  . Diabetes mellitus without mention of complication [id number]    Patient states that she is not having any symptoms at this time. Symptoms tier reviewed as well as criteria for ending isolation. Preventative practices reviewed. Patient verbalized understanding.    Patient advised to call back if she decides that she does want to get infusion. Callback number to the infusion center given. Patient advised to go to Urgent care or ED with severe symptoms.

## 2019-11-19 NOTE — H&P (View-Only) (Signed)
H&P  Chief Complaint: Right sided kidney stone  History of Present Illness: [name] is a 74 y.o. year old female who presents for ESL mgmt of a 17x8 mm rt UPJ stone.  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   History of kidney stones    History of radiation therapy 09-10-2015 to [phone]   left breast 50Gy   Iron deficiency anemia    Malignant neoplasm of lower-inner quadrant of left breast in female, estrogen receptor positive [facility]) oncologist-  dr Mathis Dad higgs ([facility])   dx 08-16-2015--  Stage 1A invasisive (T1c,N0,M0), ER/PR+, HER2 negative---- 06-26-2015 left partial mastectomy , 07-31-2015 left axilla node dissection x1 (negative)-- completed radiation 10-08-2015 and started arimidex 10-16-2015   Mixed hyperlipidemia    Nephrolithiasis    bilateral per CT 04-28-2018   Type 2 diabetes mellitus ([place])    followed by pcp   Ureteral calculi    left   Urgency of urination    Wears glasses     Past Surgical History:  Procedure Laterality Date   AXILLARY SENTINEL NODE BIOPSY Left 07/31/2015   Procedure: SENTINEL LYMPH NODE BIOPSY, LEFT AXILLA, ;  Surgeon: Aviva Signs, MD;  [facility];  Service: General;  Laterality: Left;  Sentinel Node @ 1000   BREAST BIOPSY Left 06/09/2016   Procedure: LEFT BREAST BIOPSY AFTER NEEDLE LOCALIZATION;  Surgeon: Aviva Signs, MD;  [facility];  Service: General;  Laterality: Left;   CARPAL TUNNEL RELEASE Left 11/02/2014   Procedure: CARPAL TUNNEL RELEASE;  Surgeon: Charlotte Crumb, MD;  Location: [place];  Service: Orthopedics;  Laterality: Left;   CATARACT EXTRACTION W/  INTRAOCULAR LENS  IMPLANT, BILATERAL  2007   COLONOSCOPY N/A 02/03/2017   Procedure: COLONOSCOPY;  Surgeon: Danie Binder, MD;  [facility];  Service: Endoscopy;  Laterality: N/A;  1245   COLONOSCOPY W/ BIOPSIES AND POLYPECTOMY     CYSTOSCOPY WITH RETROGRADE PYELOGRAM, URETEROSCOPY AND STENT PLACEMENT Left 05/09/2018   Procedure: CYSTOSCOPY WITH RETROGRADE PYELOGRAM, URETEROSCOPY AND STENT PLACEMENT;  Surgeon: Cleon Gustin, MD;  Location: [place];  Service: Urology;  Laterality: Left;   CYSTOSCOPY/URETEROSCOPY/HOLMIUM LASER/STENT PLACEMENT Bilateral 03/18/2017   Procedure: CYSTOSCOPY/ BILATERAL RETROGRADE/RIGHT URETEROSCOPY/ RIGHT HOLMIUM LASER APPLICATION/RIGHT URETERAL STENT PLACEMENT;  Surgeon: Irine Seal, MD;  [facility];  Service: Urology;  Laterality: Bilateral;   HOLMIUM LASER APPLICATION Left 1/61/0960   Procedure: HOLMIUM LASER APPLICATION;  Surgeon: Cleon Gustin, MD;  Location: [facility];  Service: Urology;  Laterality: Left;   OPEN REDUCTION INTERNAL FIXATION (ORIF) DISTAL RADIAL FRACTURE Left 11/02/2014   Procedure: OPEN REDUCTION INTERNAL FIXATION (ORIF) DISTAL RADIAL FRACTURE;  Surgeon: Charlotte Crumb, MD;  Location: [place];  Service: Orthopedics;  Laterality: Left;   PARTIAL MASTECTOMY WITH NEEDLE LOCALIZATION Left 06/26/2015   Procedure: PARTIAL MASTECTOMY WITH NEEDLE LOCALIZATION;  Surgeon: Aviva Signs, MD;  [facility];  Service: General;  Laterality: Left;  Needle Loc @ 8:00am   POLYPECTOMY  02/03/2017   Procedure: POLYPECTOMY;  Surgeon: Danie Binder, MD;  [facility];  Service: Endoscopy;;  descending and hepatic flexure   TARSAL METATARSAL ARTHRODESIS Left 09-07-2007   dr Beola Cord  [facility]   first and second tarsal metatarsal fusion, gastroc slide, neurolysis   TOTAL ABDOMINAL HYSTERECTOMY W/ BILATERAL SALPINGOOPHORECTOMY  10-21-2004   dr soper  [facility]    Home Medications:  No medications prior to  admission.    Allergies: No Known Allergies  Family History  Problem Relation Age of Onset   Hypertension Mother    Osteoporosis Mother    Diabetes Sister        x3   Colon cancer Neg Hx     Social History:  reports that she quit smoking about 33 years ago. Her smoking use included cigarettes. She has a 20.00 pack-year smoking history. She has never used smokeless tobacco. She reports that she does not drink alcohol or use drugs.  ROS: A complete review of systems was performed.  All systems are negative except for pertinent findings as noted.  Physical Exam:  Vital signs in last 24 hours:   General:  Alert and oriented, No acute distress HEENT: Normocephalic, atraumatic Neck: No JVD or lymphadenopathy Cardiovascular: Regular rate and rhythm Lungs: Clear bilaterally Abdomen: Soft, nontender, nondistended, no abdominal masses Back: No CVA tenderness Extremities: No edema Neurologic: Grossly intact  Laboratory Data:  No results found for this or any previous visit (from the past 24 hour(s)). Recent Results (from the past 240 hour(s))  SARS CORONAVIRUS 2 (TAT 6-24 HRS) Nasopharyngeal Nasopharyngeal Swab     Status: Abnormal   Collection Time: 11/16/19 10:18 AM   Specimen: Nasopharyngeal Swab  Result Value Ref Range Status   SARS Coronavirus 2 POSITIVE (A) NEGATIVE Final    Comment: (NOTE) SARS-CoV-2 target nucleic acids are DETECTED. The SARS-CoV-2 RNA is generally detectable in upper and lower respiratory specimens during the acute phase of infection. Positive results are indicative of the presence of SARS-CoV-2 RNA. Clinical correlation with patient history and other diagnostic information is  necessary to determine patient infection status. Positive results do not rule out bacterial infection or co-infection with other viruses.  The expected result is Negative. Fact Sheet for Patients: SugarRoll.be Fact Sheet for Healthcare  Providers: https://www.woods-mathews.com/ This test is not yet approved or cleared by the [place] FDA and  has been authorized for detection and/or diagnosis of SARS-CoV-2 by FDA under an Emergency Use Authorization (EUA). This EUA will remain  in effect (meaning this test can be used) for the duration of the COVID-19 declaration under Section 564(b)(1) of the Act, 21 U.S.C. se ction 360bbb-3(b)(1), unless the authorization is terminated or revoked sooner. Performed at [facility], [place] [address], [place], [place] [address]    Creatinine: No results for input(s): CREATININE in the last 168 hours.  Radiologic Imaging: No results found.  Impression/Assessment:  Rt UPJ stone  Plan:  ESL. This is the 1st stage of a probable multiple staged treastment. The pt is aware of the possibility of this staged mgmt.  Procedure cancelled d/t + Covid test  Jorja Loa 11/19/2019, 8:40 PM  Jessica Boxer. Shameria Trimarco MD

## 2019-11-19 NOTE — H&P (Signed)
H&P  Chief Complaint: Right sided kidney stone  History of Present Illness: [name] is a 74 y.o. year old female who presents for ESL mgmt of a 17x8 mm rt UPJ stone.  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   History of kidney stones    History of radiation therapy 09-10-2015 to [phone]   left breast 50Gy   Iron deficiency anemia    Malignant neoplasm of lower-inner quadrant of left breast in female, estrogen receptor positive [facility]) oncologist-  dr Mathis Dad higgs ([facility])   dx 08-16-2015--  Stage 1A invasisive (T1c,N0,M0), ER/PR+, HER2 negative---- 06-26-2015 left partial mastectomy , 07-31-2015 left axilla node dissection x1 (negative)-- completed radiation 10-08-2015 and started arimidex 10-16-2015   Mixed hyperlipidemia    Nephrolithiasis    bilateral per CT 04-28-2018   Type 2 diabetes mellitus ([place])    followed by pcp   Ureteral calculi    left   Urgency of urination    Wears glasses     Past Surgical History:  Procedure Laterality Date   AXILLARY SENTINEL NODE BIOPSY Left 07/31/2015   Procedure: SENTINEL LYMPH NODE BIOPSY, LEFT AXILLA, ;  Surgeon: Aviva Signs, MD;  [facility];  Service: General;  Laterality: Left;  Sentinel Node @ 1000   BREAST BIOPSY Left 06/09/2016   Procedure: LEFT BREAST BIOPSY AFTER NEEDLE LOCALIZATION;  Surgeon: Aviva Signs, MD;  [facility];  Service: General;  Laterality: Left;   CARPAL TUNNEL RELEASE Left 11/02/2014   Procedure: CARPAL TUNNEL RELEASE;  Surgeon: Charlotte Crumb, MD;  Location: [place];  Service: Orthopedics;  Laterality: Left;   CATARACT EXTRACTION W/  INTRAOCULAR LENS  IMPLANT, BILATERAL  2007   COLONOSCOPY N/A 02/03/2017   Procedure: COLONOSCOPY;  Surgeon: Danie Binder, MD;  [facility];  Service: Endoscopy;  Laterality: N/A;  1245   COLONOSCOPY W/ BIOPSIES AND POLYPECTOMY     CYSTOSCOPY WITH RETROGRADE PYELOGRAM, URETEROSCOPY AND STENT PLACEMENT Left 05/09/2018   Procedure: CYSTOSCOPY WITH RETROGRADE PYELOGRAM, URETEROSCOPY AND STENT PLACEMENT;  Surgeon: Cleon Gustin, MD;  Location: [facility];  Service: Urology;  Laterality: Left;   CYSTOSCOPY/URETEROSCOPY/HOLMIUM LASER/STENT PLACEMENT Bilateral 03/18/2017   Procedure: CYSTOSCOPY/ BILATERAL RETROGRADE/RIGHT URETEROSCOPY/ RIGHT HOLMIUM LASER APPLICATION/RIGHT URETERAL STENT PLACEMENT;  Surgeon: Irine Seal, MD;  [facility];  Service: Urology;  Laterality: Bilateral;   HOLMIUM LASER APPLICATION Left 04/03/8415   Procedure: HOLMIUM LASER APPLICATION;  Surgeon: Cleon Gustin, MD;  Location: [facility];  Service: Urology;  Laterality: Left;   OPEN REDUCTION INTERNAL FIXATION (ORIF) DISTAL RADIAL FRACTURE Left 11/02/2014   Procedure: OPEN REDUCTION INTERNAL FIXATION (ORIF) DISTAL RADIAL FRACTURE;  Surgeon: Charlotte Crumb, MD;  Location: [place];  Service: Orthopedics;  Laterality: Left;   PARTIAL MASTECTOMY WITH NEEDLE LOCALIZATION Left 06/26/2015   Procedure: PARTIAL MASTECTOMY WITH NEEDLE LOCALIZATION;  Surgeon: Aviva Signs, MD;  [facility];  Service: General;  Laterality: Left;  Needle Loc @ 8:00am   POLYPECTOMY  02/03/2017   Procedure: POLYPECTOMY;  Surgeon: Danie Binder, MD;  [facility];  Service: Endoscopy;;  descending and hepatic flexure   TARSAL METATARSAL ARTHRODESIS Left 09-07-2007   dr Beola Cord  [facility]   first and second tarsal metatarsal fusion, gastroc slide, neurolysis   TOTAL ABDOMINAL HYSTERECTOMY W/ BILATERAL SALPINGOOPHORECTOMY  10-21-2004   dr soper  [facility]    Home Medications:  No medications prior to  admission.    Allergies: No Known Allergies  Family History  Problem Relation Age of Onset   Hypertension Mother    Osteoporosis Mother    Diabetes Sister        x3   Colon cancer Neg Hx     Social History:  reports that she quit smoking about 33 years ago. Her smoking use included cigarettes. She has a 20.00 pack-year smoking history. She has never used smokeless tobacco. She reports that she does not drink alcohol or use drugs.  ROS: A complete review of systems was performed.  All systems are negative except for pertinent findings as noted.  Physical Exam:  Vital signs in last 24 hours:   General:  Alert and oriented, No acute distress HEENT: Normocephalic, atraumatic Neck: No JVD or lymphadenopathy Cardiovascular: Regular rate and rhythm Lungs: Clear bilaterally Abdomen: Soft, nontender, nondistended, no abdominal masses Back: No CVA tenderness Extremities: No edema Neurologic: Grossly intact  Laboratory Data:  No results found for this or any previous visit (from the past 24 hour(s)). Recent Results (from the past 240 hour(s))  SARS CORONAVIRUS 2 (TAT 6-24 HRS) Nasopharyngeal Nasopharyngeal Swab     Status: Abnormal   Collection Time: 11/16/19 10:18 AM   Specimen: Nasopharyngeal Swab  Result Value Ref Range Status   SARS Coronavirus 2 POSITIVE (A) NEGATIVE Final    Comment: (NOTE) SARS-CoV-2 target nucleic acids are DETECTED. The SARS-CoV-2 RNA is generally detectable in upper and lower respiratory specimens during the acute phase of infection. Positive results are indicative of the presence of SARS-CoV-2 RNA. Clinical correlation with patient history and other diagnostic information is  necessary to determine patient infection status. Positive results do not rule out bacterial infection or co-infection with other viruses.  The expected result is Negative. Fact Sheet for Patients: SugarRoll.be Fact Sheet for Healthcare  Providers: https://www.woods-mathews.com/ This test is not yet approved or cleared by the [place] FDA and  has been authorized for detection and/or diagnosis of SARS-CoV-2 by FDA under an Emergency Use Authorization (EUA). This EUA will remain  in effect (meaning this test can be used) for the duration of the COVID-19 declaration under Section 564(b)(1) of the Act, 21 U.S.C. se ction 360bbb-3(b)(1), unless the authorization is terminated or revoked sooner. Performed at [facility], [place] [address], [place], [place] [address]    Creatinine: No results for input(s): CREATININE in the last 168 hours.  Radiologic Imaging: No results found.  Impression/Assessment:  Rt UPJ stone  Plan:  ESL. This is the 1st stage of a probable multiple staged treastment. The pt is aware of the possibility of this staged mgmt.  Procedure cancelled d/t + Covid test  Jorja Loa 11/19/2019, 8:40 PM  Lillette Boxer. Kaylan Yates MD

## 2019-11-20 ENCOUNTER — Other Ambulatory Visit: Payer: Self-pay | Admitting: Nurse Practitioner

## 2019-11-20 ENCOUNTER — Ambulatory Visit (HOSPITAL_COMMUNITY): Admission: RE | Admit: 2019-11-20 | Payer: Medicare HMO | Source: Ambulatory Visit | Admitting: Urology

## 2019-11-20 DIAGNOSIS — U071 COVID-19: Secondary | ICD-10-CM

## 2019-11-20 DIAGNOSIS — I10 Essential (primary) hypertension: Secondary | ICD-10-CM

## 2019-11-20 SURGERY — LITHOTRIPSY, ESWL
Anesthesia: LOCAL | Laterality: Right

## 2019-11-20 NOTE — Progress Notes (Signed)
  I connected by phone with [name] on 11/20/2019 at 2:27 PM to discuss the potential use of an new treatment for mild to moderate COVID-19 viral infection in non-hospitalized patients.  This patient is a 74 y.o. female that meets the FDA criteria for Emergency Use Authorization of bamlanivimab or casirivimab\imdevimab.  Has a (+) direct SARS-CoV-2 viral test result  Has mild or moderate COVID-19   Is ? 75 years of age and weighs ? 40 kg  Is NOT hospitalized due to COVID-19  Is NOT requiring oxygen therapy or requiring an increase in baseline oxygen flow rate due to COVID-19  Is within 10 days of symptom onset  Has at least one of the high risk factor(s) for progression to severe COVID-19 and/or hospitalization as defined in EUA.  Specific high risk criteria : Hypertension   I have spoken and communicated the following to the patient or parent/caregiver:  1. FDA has authorized the emergency use of bamlanivimab and casirivimab\imdevimab for the treatment of mild to moderate COVID-19 in adults and pediatric patients with positive results of direct SARS-CoV-2 viral testing who are 5 years of age and older weighing at least 40 kg, and who are at high risk for progressing to severe COVID-19 and/or hospitalization.  2. The significant known and potential risks and benefits of bamlanivimab and casirivimab\imdevimab, and the extent to which such potential risks and benefits are unknown.  3. Information on available alternative treatments and the risks and benefits of those alternatives, including clinical trials.  4. Patients treated with bamlanivimab and casirivimab\imdevimab should continue to self-isolate and use infection control measures (e.g., wear mask, isolate, social distance, avoid sharing personal items, clean and disinfect "high touch" surfaces, and frequent handwashing) according to CDC guidelines.   5. The patient or parent/caregiver has the option to accept or refuse  bamlanivimab or casirivimab\imdevimab .  After reviewing this information with the patient, The patient agreed to proceed with receiving the bamlanimivab infusion and will be provided a copy of the Fact sheet prior to receiving the infusion.Fenton Foy 11/20/2019 2:27 PM

## 2019-11-20 NOTE — Progress Notes (Signed)
Notified Juliann Pulse Curator with [employer]) in regards to pts lab results. Also notified Dr Diona Fanti whom was to notify pt.

## 2019-11-22 ENCOUNTER — Ambulatory Visit (HOSPITAL_COMMUNITY): Payer: PPO | Admitting: Hematology

## 2019-11-22 ENCOUNTER — Other Ambulatory Visit (HOSPITAL_COMMUNITY): Payer: PPO

## 2019-11-23 ENCOUNTER — Ambulatory Visit (HOSPITAL_COMMUNITY)
Admission: RE | Admit: 2019-11-23 | Discharge: 2019-11-23 | Disposition: A | Discharge status: Home / Self Care | Payer: Medicare Other | Source: Ambulatory Visit | Attending: Pulmonary Disease | Admitting: Pulmonary Disease

## 2019-11-23 DIAGNOSIS — I10 Essential (primary) hypertension: Secondary | ICD-10-CM | POA: Insufficient documentation

## 2019-11-23 DIAGNOSIS — U071 COVID-19: Secondary | ICD-10-CM | POA: Diagnosis present

## 2019-11-23 DIAGNOSIS — Z23 Encounter for immunization: Secondary | ICD-10-CM | POA: Diagnosis not present

## 2019-11-23 MED ORDER — SODIUM CHLORIDE 0.9 % IV SOLN
700.0000 mg | Freq: Once | INTRAVENOUS | Status: AC
  Administered 2019-11-23: 11:00:00 700 mg via INTRAVENOUS
  Filled 2019-11-23: qty 20

## 2019-11-23 MED ORDER — SODIUM CHLORIDE 0.9 % IV SOLN
INTRAVENOUS | Status: DC | PRN
  Administered 2019-11-23: 11:00:00 250 mL via INTRAVENOUS

## 2019-11-23 MED ORDER — ALBUTEROL SULFATE HFA 108 (90 BASE) MCG/ACT IN AERS: 2.0000 | INHALATION_SPRAY | Freq: Once | RESPIRATORY_TRACT | Status: DC | PRN

## 2019-11-23 MED ORDER — FAMOTIDINE IN NACL 20-0.9 MG/50ML-% IV SOLN: 20.0000 mg | Freq: Once | INTRAVENOUS | Status: DC | PRN

## 2019-11-23 MED ORDER — METHYLPREDNISOLONE SODIUM SUCC 125 MG IJ SOLR: 125.0000 mg | Freq: Once | INTRAMUSCULAR | Status: DC | PRN

## 2019-11-23 MED ORDER — EPINEPHRINE 0.3 MG/0.3ML IJ SOAJ: 0.3000 mg | Freq: Once | INTRAMUSCULAR | Status: DC | PRN

## 2019-11-23 MED ORDER — DIPHENHYDRAMINE HCL 50 MG/ML IJ SOLN: 50.0000 mg | Freq: Once | INTRAMUSCULAR | Status: DC | PRN

## 2019-11-23 NOTE — Progress Notes (Signed)
  Diagnosis: COVID-19  Physician:  Procedure: [facility] Med: bamlanivimab infusion - Provided patient with bamlanimivab fact sheet for patients, parents and caregivers prior to infusion.  Complications: No immediate complications noted.  Discharge: Discharged home   Virgilio Belling 11/23/2019

## 2019-11-23 NOTE — Discharge Instructions (Signed)

## 2019-11-29 ENCOUNTER — Other Ambulatory Visit (HOSPITAL_COMMUNITY): Payer: Self-pay | Admitting: *Deleted

## 2019-11-29 DIAGNOSIS — C50312 Malignant neoplasm of lower-inner quadrant of left female breast: Secondary | ICD-10-CM

## 2019-11-30 ENCOUNTER — Inpatient Hospital Stay (HOSPITAL_COMMUNITY): Payer: Medicare HMO | Admitting: Hematology

## 2019-11-30 ENCOUNTER — Inpatient Hospital Stay (HOSPITAL_COMMUNITY): Payer: Medicare HMO | Attending: Hematology

## 2019-11-30 ENCOUNTER — Encounter (HOSPITAL_COMMUNITY): Payer: Self-pay | Admitting: Hematology

## 2019-11-30 ENCOUNTER — Other Ambulatory Visit: Payer: Self-pay

## 2019-11-30 VITALS — BP 161/83 | HR 92 | Temp 97.3°F | Resp 20 | Wt 175.1 lb

## 2019-11-30 DIAGNOSIS — Z170 Estrogen receptor positive status [ER+]: Secondary | ICD-10-CM | POA: Diagnosis not present

## 2019-11-30 DIAGNOSIS — Z79811 Long term (current) use of aromatase inhibitors: Secondary | ICD-10-CM | POA: Insufficient documentation

## 2019-11-30 DIAGNOSIS — N183 Chronic kidney disease, stage 3 unspecified: Secondary | ICD-10-CM | POA: Diagnosis not present

## 2019-11-30 DIAGNOSIS — M1990 Unspecified osteoarthritis, unspecified site: Secondary | ICD-10-CM | POA: Diagnosis not present

## 2019-11-30 DIAGNOSIS — K760 Fatty (change of) liver, not elsewhere classified: Secondary | ICD-10-CM | POA: Insufficient documentation

## 2019-11-30 DIAGNOSIS — Z87891 Personal history of nicotine dependence: Secondary | ICD-10-CM | POA: Insufficient documentation

## 2019-11-30 DIAGNOSIS — C50312 Malignant neoplasm of lower-inner quadrant of left female breast: Secondary | ICD-10-CM

## 2019-11-30 DIAGNOSIS — Z791 Long term (current) use of non-steroidal anti-inflammatories (NSAID): Secondary | ICD-10-CM | POA: Diagnosis not present

## 2019-11-30 DIAGNOSIS — K219 Gastro-esophageal reflux disease without esophagitis: Secondary | ICD-10-CM | POA: Diagnosis not present

## 2019-11-30 DIAGNOSIS — Z923 Personal history of irradiation: Secondary | ICD-10-CM | POA: Diagnosis not present

## 2019-11-30 DIAGNOSIS — E1122 Type 2 diabetes mellitus with diabetic chronic kidney disease: Secondary | ICD-10-CM | POA: Diagnosis not present

## 2019-11-30 DIAGNOSIS — Z79899 Other long term (current) drug therapy: Secondary | ICD-10-CM | POA: Insufficient documentation

## 2019-11-30 DIAGNOSIS — R1012 Left upper quadrant pain: Secondary | ICD-10-CM | POA: Diagnosis not present

## 2019-11-30 DIAGNOSIS — I129 Hypertensive chronic kidney disease with stage 1 through stage 4 chronic kidney disease, or unspecified chronic kidney disease: Secondary | ICD-10-CM | POA: Diagnosis not present

## 2019-11-30 DIAGNOSIS — M8580 Other specified disorders of bone density and structure, unspecified site: Secondary | ICD-10-CM | POA: Insufficient documentation

## 2019-11-30 DIAGNOSIS — Z7984 Long term (current) use of oral hypoglycemic drugs: Secondary | ICD-10-CM | POA: Diagnosis not present

## 2019-11-30 LAB — CBC WITH DIFFERENTIAL/PLATELET
Abs Immature Granulocytes: 0.04 10*3/uL (ref 0.00–0.07)
Basophils Absolute: 0 10*3/uL (ref 0.0–0.1)
Basophils Relative: 0 %
Eosinophils Absolute: 0.1 10*3/uL (ref 0.0–0.5)
Eosinophils Relative: 1 %
HCT: 40.4 % (ref 36.0–46.0)
Hemoglobin: 12.6 g/dL (ref 12.0–15.0)
Immature Granulocytes: 1 %
Lymphocytes Relative: 18 %
Lymphs Abs: 1.1 10*3/uL (ref 0.7–4.0)
MCH: 28.1 pg (ref 26.0–34.0)
MCHC: 31.2 g/dL (ref 30.0–36.0)
MCV: 90.2 fL (ref 80.0–100.0)
Monocytes Absolute: 0.4 10*3/uL (ref 0.1–1.0)
Monocytes Relative: 7 %
Neutro Abs: 4.3 10*3/uL (ref 1.7–7.7)
Neutrophils Relative %: 73 %
Platelets: 272 10*3/uL (ref 150–400)
RBC: 4.48 MIL/uL (ref 3.87–5.11)
RDW: 14.6 % (ref 11.5–15.5)
WBC: 5.9 10*3/uL (ref 4.0–10.5)
nRBC: 0.3 % — ABNORMAL HIGH (ref 0.0–0.2)

## 2019-11-30 LAB — COMPREHENSIVE METABOLIC PANEL
ALT: 24 U/L (ref 0–44)
AST: 25 U/L (ref 15–41)
Albumin: 3.9 g/dL (ref 3.5–5.0)
Alkaline Phosphatase: 59 U/L (ref 38–126)
Anion gap: 9 (ref 5–15)
BUN: 15 mg/dL (ref 8–23)
CO2: 26 mmol/L (ref 22–32)
Calcium: 9.8 mg/dL (ref 8.9–10.3)
Chloride: 108 mmol/L (ref 98–111)
Creatinine, Ser: 1.06 mg/dL — ABNORMAL HIGH (ref 0.44–1.00)
GFR calc Af Amer: 60 mL/min — ABNORMAL LOW (ref >60)
GFR calc non Af Amer: 52 mL/min — ABNORMAL LOW (ref >60)
Glucose, Bld: 112 mg/dL — ABNORMAL HIGH (ref 70–99)
Potassium: 4.1 mmol/L (ref 3.5–5.1)
Sodium: 143 mmol/L (ref 135–145)
Total Bilirubin: 0.6 mg/dL (ref 0.3–1.2)
Total Protein: 6.9 g/dL (ref 6.5–8.1)

## 2019-11-30 LAB — VITAMIN D 25 HYDROXY (VIT D DEFICIENCY, FRACTURES): Vit D, 25-Hydroxy: 38.86 ng/mL (ref 30–100)

## 2019-11-30 LAB — LACTATE DEHYDROGENASE: LDH: 165 U/L (ref 98–192)

## 2019-11-30 NOTE — Progress Notes (Signed)
[place], [place] [address]   CLINIC:  Medical Oncology/Hematology  PCP:  Nickola Major, MD [address] [place]  [address] [phone]   REASON FOR VISIT:  Follow-up for Breast Cancer   CURRENT THERAPY: Anastrozole   BRIEF ONCOLOGIC HISTORY:  Oncology History  Breast cancer ([place]) (Resolved)  08/16/2015 Initial Diagnosis   Breast cancer ([place])   09/11/2015 -  Radiation Therapy   Dr. 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 ER+ 100%, PR+ 80%   05/02/2015 Procedure   Left breast stereotactic core needle biopsy   06/26/2015 Oncotype testing   ONCOTYPE DX assay with recurrence score result of 5, Low risk, 10 year risk of distance recurrence tamoxifen alone 5%   06/26/2015 Definitive Surgery   Breast partial mastectomy after needle localization with Dr. Arnoldo Morale no sentinel node performed   06/26/2015 Pathology Results   INVASIVE DUCTAL CARCINOMA, 2 CM. - HIGH GRADE DUCTAL CARCINOMA IN SITU. - INVASIVE CARCINOMA FOCALLY LESS THAN 0.1 CM FROM LATERAL MARGIN. - DUCTAL CARCINOMA IN SITU FOCALLY LESS THAN 0.1 CM FROM THE LATERAL MARGIN.   06/26/2015 Pathology Results   ER+ 100%, PR + 60%, Ki-67 25%, HER2 NEGATIVE   07/31/2015 Procedure   Sentinel node procedure   07/31/2015 Pathology Results   ONE BENIGN LYMPH NODE (0/1).   08/26/2015 Imaging   Bone density- Normal   09/10/2015 - 10/08/2015 Radiation Therapy   50 Gy left breast by Dr. Isidore Moos.   10/16/2015 -  Anti-estrogen oral therapy   Arimidex   05/08/2016 Procedure   Stereotactic-guided biopsies of the calcifications of left breast.   05/08/2016 Pathology Results   Breast, left, needle core biopsy, lateral and ant to lumpectomy site FIBROCYSTIC CHANGES VASCULAR CALCIFICATIONS NO EVIDENCE OF MALIGNANCY   05/08/2016 Pathology Results   2. Breast, left, needle core biopsy, lumpectomy site FAT NECROSIS WITH FIBROSIS AND MICROCALCIFICATIONS NO EVIDENCE OF MALIGNANCY   06/09/2016 Surgery   Left breast biopsy for microcalcifications. Path: benign.      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 female presents today for follow-up.  She reports overall doing well.  She denies any changes in her health history since her last office visit she denies any significant fatigue.  No changes in her breast.  Denies any new masses, lumps, nipple inversion or discharge.  No change in bowel habits.  Appetite is stable.  No weight loss.  She is here for repeat labs and office visit.   REVIEW OF SYSTEMS:  Review of Systems  Musculoskeletal: Positive for arthralgias.  All other systems reviewed and are negative.    PAST MEDICAL/SURGICAL HISTORY:  Past Medical History:  Diagnosis Date  . Abdominal hernia    per CT 04-28-2018  abdominal/ pelvic anterior wall hernia  . Arthritis   . CKD (chronic kidney disease), stage III   . Disorder of bone and cartilage, unspecified   .  Essential hypertension, benign   . Full dentures   . GERD (gastroesophageal reflux disease)   . 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 History of kidney stones   . History of radiation therapy 09-10-2015 to [phone]   left breast 50Gy  . Iron deficiency anemia   . Malignant neoplasm of lower-inner quadrant of left breast in female, estrogen receptor positive [facility]) oncologist-  dr  Mathis Dad higgs ([facility])   dx 08-16-2015--  Stage 1A invasisive (T1c,N0,M0), ER/PR+, HER2 negative---- 06-26-2015 left partial mastectomy , 07-31-2015 left axilla node dissection x1 (negative)-- completed radiation 10-08-2015 and started arimidex 10-16-2015  . Mixed hyperlipidemia   . Nephrolithiasis    bilateral per CT 04-28-2018  . Type 2 diabetes mellitus ([place])    followed by pcp  . Ureteral calculi    left  . Urgency of urination   . Wears glasses    Past Surgical History:  Procedure Laterality Date  . AXILLARY SENTINEL NODE BIOPSY Left 07/31/2015   Procedure: SENTINEL LYMPH NODE BIOPSY, LEFT AXILLA, ;  Surgeon: Aviva Signs, MD;  Location: [facility];  Service: General;  Laterality: Left;  Sentinel Node @ 1000  . BREAST BIOPSY Left 06/09/2016   Procedure: LEFT BREAST BIOPSY AFTER NEEDLE LOCALIZATION;  Surgeon: Aviva Signs, MD;  Location: [facility];  Service: General;  Laterality: Left;  . CARPAL TUNNEL RELEASE Left 11/02/2014   Procedure: CARPAL TUNNEL RELEASE;  Surgeon: Charlotte Crumb, MD;  Location: [facility];  Service: Orthopedics;  Laterality: Left;  . CATARACT EXTRACTION W/ INTRAOCULAR LENS  IMPLANT, BILATERAL  2007  . COLONOSCOPY N/A 02/03/2017   Procedure: COLONOSCOPY;  Surgeon: Danie Binder, MD;  [facility];  Service: Endoscopy;  Laterality: N/A;  1245  . COLONOSCOPY W/ BIOPSIES AND POLYPECTOMY    . CYSTOSCOPY WITH RETROGRADE PYELOGRAM, URETEROSCOPY AND STENT PLACEMENT Left 05/09/2018   Procedure: CYSTOSCOPY WITH RETROGRADE PYELOGRAM, URETEROSCOPY AND STENT PLACEMENT;  Surgeon: Cleon Gustin, MD;  Location: [facility];  Service: Urology;  Laterality: Left;  . CYSTOSCOPY/URETEROSCOPY/HOLMIUM LASER/STENT PLACEMENT Bilateral 03/18/2017   Procedure: CYSTOSCOPY/ BILATERAL RETROGRADE/RIGHT URETEROSCOPY/ RIGHT HOLMIUM LASER APPLICATION/RIGHT URETERAL STENT PLACEMENT;  Surgeon: Irine Seal, MD;  Location: [facility];  Service: Urology;  Laterality: Bilateral;    . HOLMIUM LASER APPLICATION Left 1/61/0960   Procedure: HOLMIUM LASER APPLICATION;  Surgeon: Cleon Gustin, MD;  Location: [facility];  Service: Urology;  Laterality: Left;  . OPEN REDUCTION INTERNAL FIXATION (ORIF) DISTAL RADIAL FRACTURE Left 11/02/2014   Procedure: OPEN REDUCTION INTERNAL FIXATION (ORIF) DISTAL RADIAL FRACTURE;  Surgeon: Charlotte Crumb, MD;  Location: [place];  Service: Orthopedics;  Laterality: Left;  . PARTIAL MASTECTOMY WITH NEEDLE LOCALIZATION Left 06/26/2015   Procedure: PARTIAL MASTECTOMY WITH NEEDLE LOCALIZATION;  Surgeon: Aviva Signs, MD;  Location: [facility];  Service: General;  Laterality: Left;  Needle Loc @ 8:00am  . POLYPECTOMY  02/03/2017   Procedure: POLYPECTOMY;  Surgeon: Danie Binder, MD;  [facility];  Service: Endoscopy;;  descending and hepatic flexure  . TARSAL METATARSAL ARTHRODESIS Left 09-07-2007   dr Beola Cord  [facility]   first and second tarsal metatarsal fusion, gastroc slide, neurolysis  . TOTAL ABDOMINAL HYSTERECTOMY W/ BILATERAL SALPINGOOPHORECTOMY  10-21-2004   dr soper  [facility]     SOCIAL HISTORY:  Social History   Socioeconomic History  . Marital status: Married    Spouse name: Not on file  .  Number of children: 2  . Years of education: Not on file  . Highest education level: Not on file  Occupational History  . Occupation: Retired  Tobacco Use  . Smoking status: Former Smoker    Packs/day: 1.00    Years: 20.00    Pack years: 20.00    Types: Cigarettes    Quit date: 06/05/1986    Years since quitting: 33.5  . Smokeless tobacco: Never Used  Substance and Sexual Activity  . Alcohol use: No  . Drug use: No  . Sexual activity: Never    Birth control/protection: Surgical  Other Topics Concern  . Not on file  Social History Narrative  . Not on file   Social Determinants of Health   Financial Resource Strain:   . Difficulty of Paying Living Expenses: Not on file  Food Insecurity:   . Worried About  Charity fundraiser in the Last Year: Not on file  . Ran Out of Food in the Last Year: Not on file  Transportation Needs:   . Lack of Transportation (Medical): Not on file  . Lack of Transportation (Non-Medical): Not on file  Physical Activity:   . Days of Exercise per Week: Not on file  . Minutes of Exercise per Session: Not on file  Stress:   . Feeling of Stress : Not on file  Social Connections:   . Frequency of Communication with Friends and Family: Not on file  . Frequency of Social Gatherings with Friends and Family: Not on file  . Attends Religious Services: Not on file  . Active Member of Clubs or Organizations: Not on file  . Attends Archivist Meetings: Not on file  . Marital Status: Not on file  Intimate Partner Violence:   . Fear of Current or Ex-Partner: Not on file  . Emotionally Abused: Not on file  . Physically Abused: Not on file  . Sexually Abused: Not on file    FAMILY HISTORY:  Family History  Problem Relation Age of Onset  . Hypertension Mother   . Osteoporosis Mother   . Diabetes Sister        x3  . Colon cancer Neg Hx     CURRENT MEDICATIONS:  Outpatient Encounter Medications as of 11/30/2019  Medication Sig  . anastrozole (ARIMIDEX) 1 MG tablet Take 1 tablet by mouth once daily  . Blood Glucose Calibration (ACCU-CHEK AVIVA) SOLN   . Blood Glucose Monitoring Suppl (ACCU-CHEK AVIVA PLUS) w/Device KIT   . calcium carbonate (TUMS EX) 750 MG chewable tablet Chew 1 tablet by mouth daily.  . Cholecalciferol (VITAMIN D3) 1000 units CAPS Take 1,000 Units by mouth at bedtime.  . fenofibrate 54 MG tablet Take 54 mg by mouth daily.   Marland Kitchen glucose blood (ONE TOUCH ULTRA TEST) test strip 1 each 2 (two) times daily. Check blood sugar 1-2 times daily  . glucose blood (PRECISION QID TEST) test strip 1 each 2 (two) times daily. Check Blood sugar 1-2 times daily  . losartan (COZAAR) 100 MG tablet Take 100 mg by mouth every morning.  . meloxicam (MOBIC) 7.5 MG  tablet Take 1 tablet (7.5 mg total) by mouth daily.  . metFORMIN (GLUCOPHAGE) 1000 MG tablet Take 1,000 mg by mouth 2 (two) times daily with a meal.   . Multiple Vitamin (MULTIVITAMIN WITH MINERALS) TABS tablet Take 1 tablet by mouth every morning. Women's One A Day 50+   . Omega-3 Fatty Acids (FISH OIL) 1200 MG CAPS Take 1,000 mg  by mouth 2 (two) times daily.   Marland Kitchen omeprazole (PRILOSEC) 20 MG capsule Take 1 capsule (20 mg total) by mouth daily.  . simvastatin (ZOCOR) 40 MG tablet Take 40 mg by mouth at bedtime.   . [DISCONTINUED] Ferrous Sulfate (IRON) 325 (65 Fe) MG TABS Take by mouth every morning.  Marland Kitchen acetaminophen (TYLENOL 8 HOUR ARTHRITIS PAIN) 650 MG CR tablet Take 650 mg by mouth every 8 (eight) hours as needed for pain.  Marland Kitchen oxyCODONE-acetaminophen (PERCOCET/ROXICET) 5-325 MG tablet   . traMADol (ULTRAM) 50 MG tablet TAKE 1 2 TO 1 (ONE HALF TO ONE) TABLET BY MOUTH EVERY 6 HOURS AS NEEDED UP TO FOR 5 DAYS FOR MODERATE PAIN   No facility-administered encounter medications on file as of 11/30/2019.    ALLERGIES:  No Known Allergies   PHYSICAL EXAM:  ECOG Performance status: 1  Vitals:   11/30/19 1040  BP: (!) 161/83  Pulse: 92  Resp: 20  Temp: (!) 97.3 F (36.3 C)  SpO2: 96%   Filed Weights   11/30/19 1040  Weight: 175 lb 1.6 oz (79.4 kg)    Physical Exam Constitutional:      Appearance: Normal appearance.  HENT:     Head: Normocephalic.     Right Ear: External ear normal.     Left Ear: External ear normal.     Nose: Nose normal.  Eyes:     Conjunctiva/sclera: Conjunctivae normal.  Cardiovascular:     Rate and Rhythm: Normal rate and regular rhythm.     Pulses: Normal pulses.     Heart sounds: Normal heart sounds.  Pulmonary:     Effort: Pulmonary effort is normal.     Breath sounds: Normal breath sounds.  Abdominal:     General: Bowel sounds are normal.  Musculoskeletal:     Cervical back: Normal range of motion.     Comments: Decreased range of motion   Skin:    General: Skin is warm.  Neurological:     General: No focal deficit present.     Mental Status: She is alert and oriented to person, place, and time.  Psychiatric:        Mood and Affect: Mood normal.        Behavior: Behavior normal.      LABORATORY DATA:  I have reviewed the labs as listed.  CBC    Component Value Date/Time   WBC 5.9 11/30/2019 0932   RBC 4.48 11/30/2019 0932   HGB 12.6 11/30/2019 0932   HCT 40.4 11/30/2019 0932   PLT 272 11/30/2019 0932   MCV 90.2 11/30/2019 0932   MCH 28.1 11/30/2019 0932   MCHC 31.2 11/30/2019 0932   RDW 14.6 11/30/2019 0932   LYMPHSABS 1.1 11/30/2019 0932   MONOABS 0.4 11/30/2019 0932   EOSABS 0.1 11/30/2019 0932   BASOSABS 0.0 11/30/2019 0932   CMP Latest Ref Rng & Units 11/30/2019 05/16/2019 11/17/2018  Glucose 70 - 99 mg/dL 112(H) 161(H) 113(H)  BUN 8 - 23 mg/dL 15 26(H) 28(H)  Creatinine 0.44 - 1.00 mg/dL 1.06(H) 1.12(H) 1.04(H)  Sodium 135 - 145 mmol/L 143 141 139  Potassium 3.5 - 5.1 mmol/L 4.1 4.3 4.5  Chloride 98 - 111 mmol/L 108 100 104  CO2 22 - 32 mmol/L '26 29 26  '$ Calcium 8.9 - 10.3 mg/dL 9.8 10.0 10.0  Total Protein 6.5 - 8.1 g/dL 6.9 6.8 6.9  Total Bilirubin 0.3 - 1.2 mg/dL 0.6 0.4 0.3  Alkaline Phos 38 - 126 U/L 59  58 67  AST 15 - 41 U/L '25 23 24  '$ ALT 0 - 44 U/L '24 24 21          '$ ASSESSMENT & PLAN:   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  Stage Ia (T1 cN0 M0) IDC of the left breast: - Status post lumpectomy on 06/26/2015, ER/PR positive, HER-2 negative -Radiation therapy from 09/10/2015 through 10/08/2015 - Anastrozole started in November 2016 -I reviewed mammogram from 05/16/2019, BI-RADS Category 2. - Physical exam today shows lumpectomy scar in the inframammary area of the left breast unchanged.  No palpable mass in bilateral breast.  No palpable adenopathy. - Recommend continue anastrozole daily. Screening mammogram in June 2021. RTC in 6 mths. If stable will  plan to transition to annual follow ups.   2.  Osteopenia: -DEXA scan in October 2017 shows T score of -0.2. -We reviewed results of DEXA scan from 09/08/2018 which shows a T score of -0.4. -She is taking Tums twice daily.  She also takes vitamin D thousand units daily.  Vitamin D level was 54. - She will need a repeat DEXA scan in October 2021.       Orders placed this encounter:  Orders Placed This Encounter  Procedures  . MM Digital Diagnostic Bilat  . US Abdomen Complete     Veralyn Lopp R Prabhnoor Ellenberger  [facility] [phone]

## 2019-11-30 NOTE — Assessment & Plan Note (Signed)
1.  Stage Ia (T1 cN0 M0) IDC of the left breast: - Status post lumpectomy on 06/26/2015, ER/PR positive, HER-2 negative -Radiation therapy from 09/10/2015 through 10/08/2015 - Anastrozole started in November 2016 -I reviewed mammogram from 05/16/2019, BI-RADS Category 2. - Physical exam today shows lumpectomy scar in the inframammary area of the left breast unchanged.  No palpable mass in bilateral breast.  No palpable adenopathy. - Recommend continue anastrozole daily. Screening mammogram in June 2021. RTC in 6 mths. If stable will plan to transition to annual follow ups.   2.  Osteopenia: -DEXA scan in October 2017 shows T score of -0.2. -We reviewed results of DEXA scan from 09/08/2018 which shows a T score of -0.4. -She is taking Tums twice daily.  She also takes vitamin D thousand units daily.  Vitamin D level was 54. - She will need a repeat DEXA scan in October 2021.

## 2019-12-05 ENCOUNTER — Other Ambulatory Visit: Payer: Self-pay

## 2019-12-05 ENCOUNTER — Ambulatory Visit (HOSPITAL_COMMUNITY)
Admission: RE | Admit: 2019-12-05 | Discharge: 2019-12-05 | Disposition: A | Discharge status: Home / Self Care | Payer: Medicare HMO | Source: Ambulatory Visit | Attending: Hematology | Admitting: Hematology

## 2019-12-05 DIAGNOSIS — Z170 Estrogen receptor positive status [ER+]: Secondary | ICD-10-CM | POA: Diagnosis present

## 2019-12-05 DIAGNOSIS — C50312 Malignant neoplasm of lower-inner quadrant of left female breast: Secondary | ICD-10-CM | POA: Diagnosis present

## 2019-12-08 ENCOUNTER — Other Ambulatory Visit: Payer: Self-pay | Admitting: Urology

## 2019-12-12 ENCOUNTER — Other Ambulatory Visit: Payer: Self-pay

## 2019-12-12 ENCOUNTER — Encounter (HOSPITAL_COMMUNITY): Payer: Self-pay | Admitting: Hematology

## 2019-12-12 ENCOUNTER — Ambulatory Visit (HOSPITAL_COMMUNITY): Payer: Medicare Other | Admitting: Hematology

## 2019-12-12 ENCOUNTER — Inpatient Hospital Stay (HOSPITAL_BASED_OUTPATIENT_CLINIC_OR_DEPARTMENT_OTHER): Payer: Medicare HMO | Admitting: Hematology

## 2019-12-12 ENCOUNTER — Encounter (HOSPITAL_BASED_OUTPATIENT_CLINIC_OR_DEPARTMENT_OTHER): Payer: Self-pay | Admitting: Urology

## 2019-12-12 DIAGNOSIS — Z170 Estrogen receptor positive status [ER+]: Secondary | ICD-10-CM | POA: Diagnosis not present

## 2019-12-12 DIAGNOSIS — C50312 Malignant neoplasm of lower-inner quadrant of left female breast: Secondary | ICD-10-CM | POA: Diagnosis not present

## 2019-12-12 NOTE — Progress Notes (Signed)
Virtual Visit via Telephone Note  I connected with [name] on 12/12/19 at  4:05 PM EST by telephone and verified that I am speaking with the correct person using two identifiers.   I discussed the limitations, risks, security and privacy concerns of performing an evaluation and management service by telephone and the availability of in person appointments. I also discussed with the patient that there may be a patient responsible charge related to this service. The patient expressed understanding and agreed to proceed.   History of Present Illness: This patient is seen in our clinic for stage I left breast cancer for which she is on anastrozole.  She also has osteopenia based on DEXA scan in 2019.   Observations/Objective: She is tolerating anastrozole very well.  She reported some nodule under her left breast area at last visit.  She also had associated pain with it.  Hence a abdominal ultrasound was done.  Denies any nausea or vomiting associated with it.  Assessment and Plan:  1.  Stage Ia (T1cN0 M0) IDC of the left breast: -Status post lumpectomy on 06/26/2015, ER/PR positive and HER-2 negative. -Radiation therapy completed on 10/09/2015. -Anastrozole starting #2016.  She is tolerating it very well. -Last mammogram was on 05/16/2019 which was BI-RADS Category 2. -She is scheduled for another mammogram this June.  We will see her back in July for follow-up.  2.  Left upper quadrant pain: -2.6 cm predominantly hypoechoic lesion in the spleen noted on the ultrasound of the abdomen on 12/05/2019. -This is likely a confluence of multiple cysts.  Overall appearance is stable from prior CT from 2019.  There is a 9 mm hypoechoic lesion identified near the splenic margin which was not well appreciated on the prior study.  Fatty liver was present. -If she continues to have pain, she was told to call us back. -We will consider follow-up imaging of this hypoechoic lesion in 6 months.  3.   Osteopenia: -DEXA scan from 09/08/2018 shows T score of -0.4.  She was told to take calcium and vitamin D supplements.  Vitamin D was normal.   Follow Up Instructions: Follow-up in July after mammogram.   I discussed the assessment and treatment plan with the patient. The patient was provided an opportunity to ask questions and all were answered. The patient agreed with the plan and demonstrated an understanding of the instructions.   The patient was advised to call back or seek an in-person evaluation if the symptoms worsen or if the condition fails to improve as anticipated.  I provided 11 minutes of non-face-to-face time during this encounter.   Sreedhar Katragadda, MD   

## 2019-12-14 ENCOUNTER — Encounter (HOSPITAL_BASED_OUTPATIENT_CLINIC_OR_DEPARTMENT_OTHER)
Admission: RE | Disposition: A | Discharge status: Home / Self Care | Payer: Self-pay | Source: Home / Self Care | Attending: Urology

## 2019-12-14 ENCOUNTER — Ambulatory Visit (HOSPITAL_COMMUNITY): Payer: Medicare HMO

## 2019-12-14 ENCOUNTER — Other Ambulatory Visit: Payer: Self-pay

## 2019-12-14 ENCOUNTER — Encounter (HOSPITAL_BASED_OUTPATIENT_CLINIC_OR_DEPARTMENT_OTHER): Payer: Self-pay | Admitting: Urology

## 2019-12-14 ENCOUNTER — Ambulatory Visit (HOSPITAL_BASED_OUTPATIENT_CLINIC_OR_DEPARTMENT_OTHER)
Admission: RE | Admit: 2019-12-14 | Discharge: 2019-12-14 | Disposition: A | Discharge status: Home / Self Care | Payer: Medicare HMO | Attending: Urology | Admitting: Urology

## 2019-12-14 DIAGNOSIS — M1990 Unspecified osteoarthritis, unspecified site: Secondary | ICD-10-CM | POA: Diagnosis not present

## 2019-12-14 DIAGNOSIS — K219 Gastro-esophageal reflux disease without esophagitis: Secondary | ICD-10-CM | POA: Diagnosis not present

## 2019-12-14 DIAGNOSIS — I129 Hypertensive chronic kidney disease with stage 1 through stage 4 chronic kidney disease, or unspecified chronic kidney disease: Secondary | ICD-10-CM | POA: Diagnosis not present

## 2019-12-14 DIAGNOSIS — Z87891 Personal history of nicotine dependence: Secondary | ICD-10-CM | POA: Diagnosis not present

## 2019-12-14 DIAGNOSIS — U071 COVID-19: Secondary | ICD-10-CM | POA: Insufficient documentation

## 2019-12-14 DIAGNOSIS — D509 Iron deficiency anemia, unspecified: Secondary | ICD-10-CM | POA: Insufficient documentation

## 2019-12-14 DIAGNOSIS — E1122 Type 2 diabetes mellitus with diabetic chronic kidney disease: Secondary | ICD-10-CM | POA: Insufficient documentation

## 2019-12-14 DIAGNOSIS — Z853 Personal history of malignant neoplasm of breast: Secondary | ICD-10-CM | POA: Diagnosis not present

## 2019-12-14 DIAGNOSIS — N183 Chronic kidney disease, stage 3 unspecified: Secondary | ICD-10-CM | POA: Insufficient documentation

## 2019-12-14 DIAGNOSIS — E782 Mixed hyperlipidemia: Secondary | ICD-10-CM | POA: Diagnosis not present

## 2019-12-14 DIAGNOSIS — N202 Calculus of kidney with calculus of ureter: Secondary | ICD-10-CM | POA: Insufficient documentation

## 2019-12-14 DIAGNOSIS — N200 Calculus of kidney: Secondary | ICD-10-CM

## 2019-12-14 HISTORY — PX: EXTRACORPOREAL SHOCK WAVE LITHOTRIPSY: SHX1557

## 2019-12-14 SURGERY — LITHOTRIPSY, ESWL
Anesthesia: LOCAL | Laterality: Right

## 2019-12-14 MED ORDER — DIAZEPAM 5 MG PO TABS
ORAL_TABLET | ORAL | Status: AC
  Filled 2019-12-14: qty 2

## 2019-12-14 MED ORDER — CIPROFLOXACIN HCL 500 MG PO TABS
500.0000 mg | ORAL_TABLET | ORAL | Status: AC
  Administered 2019-12-14: 500 mg via ORAL
  Filled 2019-12-14: qty 1

## 2019-12-14 MED ORDER — DIAZEPAM 5 MG PO TABS
10.0000 mg | ORAL_TABLET | ORAL | Status: AC
  Administered 2019-12-14: 10 mg via ORAL
  Filled 2019-12-14: qty 2

## 2019-12-14 MED ORDER — MELOXICAM 7.5 MG PO TABS: 7.5000 mg | ORAL_TABLET | Freq: Every day | ORAL | 5 refills | Status: DC

## 2019-12-14 MED ORDER — CIPROFLOXACIN HCL 500 MG PO TABS
ORAL_TABLET | ORAL | Status: AC
  Filled 2019-12-14: qty 1

## 2019-12-14 MED ORDER — SODIUM CHLORIDE 0.9 % IV SOLN
INTRAVENOUS | Status: DC
  Filled 2019-12-14: qty 1000

## 2019-12-14 MED ORDER — DIPHENHYDRAMINE HCL 25 MG PO CAPS
ORAL_CAPSULE | ORAL | Status: AC
  Filled 2019-12-14: qty 1

## 2019-12-14 MED ORDER — DIPHENHYDRAMINE HCL 25 MG PO CAPS
25.0000 mg | ORAL_CAPSULE | ORAL | Status: AC
  Administered 2019-12-14: 10:00:00 25 mg via ORAL
  Filled 2019-12-14: qty 1

## 2019-12-14 NOTE — Interval H&P Note (Signed)
History and Physical Interval Note:  12/14/2019 11:21 AM  [name]  has presented today for surgery, with the diagnosis of RIGHT URETEROPELVIC JUNCTION STONE.  The various methods of treatment have been discussed with the patient and family. After consideration of risks, benefits and other options for treatment, the patient has consented to  Procedure(s): EXTRACORPOREAL SHOCK WAVE LITHOTRIPSY (ESWL) (Right) as a surgical intervention.  The patient's history has been reviewed, patient examined, no change in status, stable for surgery. She has not passed the stone. No pain. No dysuria. Mild hematuria (pink urine). No fever. No cough. Delayed by covid.  I have reviewed the patient's chart and labs.  Questions were answered to the patient's satisfaction.  She elects to proceed.    Festus Aloe

## 2019-12-14 NOTE — Discharge Instructions (Signed)
Post Anesthesia Home Care Instructions  Activity: Get plenty of rest for the remainder of the day. A responsible adult should stay with you for 24 hours following the procedure.  For the next 24 hours, DO NOT: -Drive a car -Operate machinery -Drink alcoholic beverages -Take any medication unless instructed by your physician -Make any legal decisions or sign important papers.  Meals: Start with liquid foods such as gelatin or soup. Progress to regular foods as tolerated. Avoid greasy, spicy, heavy foods. If nausea and/or vomiting occur, drink only clear liquids until the nausea and/or vomiting subsides. Call your physician if vomiting continues.  Special Instructions/Symptoms: Your throat may feel dry or sore from the anesthesia or the breathing tube placed in your throat during surgery. If this causes discomfort, gargle with warm salt water. The discomfort should disappear within 24 hours.  If you had a scopolamine patch placed behind your ear for the management of post- operative nausea and/or vomiting:  1. The medication in the patch is effective for 72 hours, after which it should be removed.  Wrap patch in a tissue and discard in the trash. Wash hands thoroughly with soap and water. 2. You may remove the patch earlier than 72 hours if you experience unpleasant side effects which may include dry mouth, dizziness or visual disturbances. 3. Avoid touching the patch. Wash your hands with soap and water after contact with the patch.   Lithotripsy, Care After This sheet gives you information about how to care for yourself after your procedure. Your health care provider may also give you more specific instructions. If you have problems or questions, contact your health care provider. What can I expect after the procedure? After the procedure, it is common to have:  Some blood in your urine. This should only last for a few days.  Soreness in your back, sides, or upper abdomen for a few  days.  Blotches or bruises on your back where the pressure wave entered the skin.  Pain, discomfort, or nausea when pieces (fragments) of the kidney stone move through the tube that carries urine from the kidney to the bladder (ureter). Stone fragments may pass soon after the procedure, but they may continue to pass for up to 4-8 weeks. ? If you have severe pain or nausea, contact your health care provider. This may be caused by a large stone that was not broken up, and this may mean that you need more treatment.  Some pain or discomfort during urination.  Some pain or discomfort in the lower abdomen or (in men) at the base of the penis. Follow these instructions at home: Medicines  Take over-the-counter and prescription medicines only as told by your health care provider.  If you were prescribed an antibiotic medicine, take it as told by your health care provider. Do not stop taking the antibiotic even if you start to feel better.  Do not drive for 24 hours if you were given a medicine to help you relax (sedative).  Do not drive or use heavy machinery while taking prescription pain medicine. Eating and drinking      Drink enough water and fluids to keep your urine clear or pale yellow. This helps any remaining pieces of the stone to pass. It can also help prevent new stones from forming.  Eat plenty of fresh fruits and vegetables.  Follow instructions from your health care provider about eating and drinking restrictions. You may be instructed: ? To reduce how much salt (sodium) you eat   or drink. Check ingredients and nutrition facts on packaged foods and beverages. ? To reduce how much meat you eat.  Eat the recommended amount of calcium for your age and gender. Ask your health care provider how much calcium you should have. General instructions  Get plenty of rest.  Most people can resume normal activities 1-2 days after the procedure. Ask your health care provider what  activities are safe for you.  Your health care provider may direct you to lie in a certain position (postural drainage) and tap firmly (percuss) over your kidney area to help stone fragments pass. Follow instructions as told by your health care provider.  If directed, strain all urine through the strainer that was provided by your health care provider. ? Keep all fragments for your health care provider to see. Any stones that are found may be sent to a medical lab for examination. The stone may be as small as a grain of salt.  Keep all follow-up visits as told by your health care provider. This is important. Contact a health care provider if:  You have pain that is severe or does not get better with medicine.  You have nausea that is severe or does not go away.  You have blood in your urine longer than your health care provider told you to expect.  You have more blood in your urine.  You have pain during urination that does not go away.  You urinate more frequently than usual and this does not go away.  You develop a rash or any other possible signs of an allergic reaction. Get help right away if:  You have severe pain in your back, sides, or upper abdomen.  You have severe pain while urinating.  Your urine is very dark red.  You have blood in your stool (feces).  You cannot pass any urine at all.  You feel a strong urge to urinate after emptying your bladder.  You have a fever or chills.  You develop shortness of breath, difficulty breathing, or chest pain.  You have severe nausea that leads to persistent vomiting.  You faint. Summary  After this procedure, it is common to have some pain, discomfort, or nausea when pieces (fragments) of the kidney stone move through the tube that carries urine from the kidney to the bladder (ureter). If this pain or nausea is severe, however, you should contact your health care provider.  Most people can resume normal activities 1-2  days after the procedure. Ask your health care provider what activities are safe for you.  Drink enough water and fluids to keep your urine clear or pale yellow. This helps any remaining pieces of the stone to pass, and it can help prevent new stones from forming.  If directed, strain your urine and keep all fragments for your health care provider to see. Fragments or stones may be as small as a grain of salt.  Get help right away if you have severe pain in your back, sides, or upper abdomen or have severe pain while urinating. This information is not intended to replace advice given to you by your health care provider. Make sure you discuss any questions you have with your health care provider. Document Revised: 02/13/2019 Document Reviewed: 09/23/2016 Elsevier Patient Education  2020 [employer]  

## 2019-12-14 NOTE — Op Note (Signed)
Right 16 mm UPJ stone   Right ESWL  Findings: Pt tolerated well. Stone fragmented well, but she may need a staged procedure if she fails to pass the stone fragments.

## 2020-01-15 ENCOUNTER — Other Ambulatory Visit: Payer: Self-pay | Admitting: Urology

## 2020-01-29 ENCOUNTER — Encounter: Payer: Self-pay | Admitting: Gastroenterology

## 2020-01-30 NOTE — Progress Notes (Signed)
Talked with pt via phone. Went over instructions with pt. Verbalized understanding. To be here at [facility]

## 2020-02-01 ENCOUNTER — Ambulatory Visit (HOSPITAL_COMMUNITY): Payer: Medicare HMO

## 2020-02-01 ENCOUNTER — Encounter (HOSPITAL_BASED_OUTPATIENT_CLINIC_OR_DEPARTMENT_OTHER): Payer: Self-pay | Admitting: Urology

## 2020-02-01 ENCOUNTER — Encounter (HOSPITAL_BASED_OUTPATIENT_CLINIC_OR_DEPARTMENT_OTHER)
Admission: RE | Disposition: A | Discharge status: Home / Self Care | Payer: Self-pay | Source: Home / Self Care | Attending: Urology

## 2020-02-01 ENCOUNTER — Ambulatory Visit (HOSPITAL_BASED_OUTPATIENT_CLINIC_OR_DEPARTMENT_OTHER)
Admission: RE | Admit: 2020-02-01 | Discharge: 2020-02-01 | Disposition: A | Discharge status: Home / Self Care | Payer: Medicare HMO | Attending: Urology | Admitting: Urology

## 2020-02-01 ENCOUNTER — Encounter: Payer: Self-pay | Admitting: Gastroenterology

## 2020-02-01 DIAGNOSIS — Z833 Family history of diabetes mellitus: Secondary | ICD-10-CM | POA: Insufficient documentation

## 2020-02-01 DIAGNOSIS — N200 Calculus of kidney: Secondary | ICD-10-CM | POA: Diagnosis present

## 2020-02-01 DIAGNOSIS — Z87891 Personal history of nicotine dependence: Secondary | ICD-10-CM | POA: Insufficient documentation

## 2020-02-01 DIAGNOSIS — Z90722 Acquired absence of ovaries, bilateral: Secondary | ICD-10-CM | POA: Insufficient documentation

## 2020-02-01 DIAGNOSIS — N183 Chronic kidney disease, stage 3 unspecified: Secondary | ICD-10-CM | POA: Diagnosis not present

## 2020-02-01 DIAGNOSIS — N202 Calculus of kidney with calculus of ureter: Secondary | ICD-10-CM | POA: Insufficient documentation

## 2020-02-01 DIAGNOSIS — Z79811 Long term (current) use of aromatase inhibitors: Secondary | ICD-10-CM | POA: Insufficient documentation

## 2020-02-01 DIAGNOSIS — I129 Hypertensive chronic kidney disease with stage 1 through stage 4 chronic kidney disease, or unspecified chronic kidney disease: Secondary | ICD-10-CM | POA: Diagnosis not present

## 2020-02-01 DIAGNOSIS — E782 Mixed hyperlipidemia: Secondary | ICD-10-CM | POA: Insufficient documentation

## 2020-02-01 DIAGNOSIS — C50312 Malignant neoplasm of lower-inner quadrant of left female breast: Secondary | ICD-10-CM | POA: Insufficient documentation

## 2020-02-01 DIAGNOSIS — K219 Gastro-esophageal reflux disease without esophagitis: Secondary | ICD-10-CM | POA: Insufficient documentation

## 2020-02-01 DIAGNOSIS — Z79899 Other long term (current) drug therapy: Secondary | ICD-10-CM | POA: Insufficient documentation

## 2020-02-01 DIAGNOSIS — Z170 Estrogen receptor positive status [ER+]: Secondary | ICD-10-CM | POA: Insufficient documentation

## 2020-02-01 DIAGNOSIS — E1122 Type 2 diabetes mellitus with diabetic chronic kidney disease: Secondary | ICD-10-CM | POA: Diagnosis not present

## 2020-02-01 DIAGNOSIS — M1990 Unspecified osteoarthritis, unspecified site: Secondary | ICD-10-CM | POA: Insufficient documentation

## 2020-02-01 DIAGNOSIS — Z87442 Personal history of urinary calculi: Secondary | ICD-10-CM | POA: Insufficient documentation

## 2020-02-01 DIAGNOSIS — Z8249 Family history of ischemic heart disease and other diseases of the circulatory system: Secondary | ICD-10-CM | POA: Diagnosis not present

## 2020-02-01 DIAGNOSIS — Z791 Long term (current) use of non-steroidal anti-inflammatories (NSAID): Secondary | ICD-10-CM | POA: Insufficient documentation

## 2020-02-01 DIAGNOSIS — Z7984 Long term (current) use of oral hypoglycemic drugs: Secondary | ICD-10-CM | POA: Insufficient documentation

## 2020-02-01 HISTORY — PX: EXTRACORPOREAL SHOCK WAVE LITHOTRIPSY: SHX1557

## 2020-02-01 LAB — GLUCOSE, CAPILLARY: Glucose-Capillary: 114 mg/dL — ABNORMAL HIGH (ref 70–99)

## 2020-02-01 SURGERY — LITHOTRIPSY, ESWL
Anesthesia: LOCAL | Laterality: Left

## 2020-02-01 MED ORDER — CIPROFLOXACIN HCL 500 MG PO TABS
ORAL_TABLET | ORAL | Status: AC
  Filled 2020-02-01: qty 1

## 2020-02-01 MED ORDER — DIPHENHYDRAMINE HCL 25 MG PO CAPS
25.0000 mg | ORAL_CAPSULE | ORAL | Status: AC
  Administered 2020-02-01: 25 mg via ORAL
  Filled 2020-02-01: qty 1

## 2020-02-01 MED ORDER — CIPROFLOXACIN HCL 500 MG PO TABS
500.0000 mg | ORAL_TABLET | ORAL | Status: AC
  Administered 2020-02-01: 500 mg via ORAL
  Filled 2020-02-01: qty 1

## 2020-02-01 MED ORDER — DIPHENHYDRAMINE HCL 25 MG PO CAPS
ORAL_CAPSULE | ORAL | Status: AC
  Filled 2020-02-01: qty 1

## 2020-02-01 MED ORDER — DIAZEPAM 5 MG PO TABS
ORAL_TABLET | ORAL | Status: AC
  Filled 2020-02-01: qty 2

## 2020-02-01 MED ORDER — DIAZEPAM 5 MG PO TABS
10.0000 mg | ORAL_TABLET | ORAL | Status: AC
  Administered 2020-02-01: 08:00:00 10 mg via ORAL
  Filled 2020-02-01: qty 2

## 2020-02-01 MED ORDER — SODIUM CHLORIDE 0.9 % IV SOLN
INTRAVENOUS | Status: DC
  Filled 2020-02-01: qty 1000

## 2020-02-01 NOTE — Discharge Instructions (Signed)
1 - You may have urinary urgency (bladder spasms), pass small stone fragments, and bloody urine on / off for up to 2 weeks. This is normal. ° °2 - Call MD or go to ER for fever >102, severe pain / nausea / vomiting not relieved by medications, or acute change in medical status ° ° ° ° ° °Post Anesthesia Home Care Instructions ° °Activity: °Get plenty of rest for the remainder of the day. A responsible individual must stay with you for 24 hours following the procedure.  °For the next 24 hours, DO NOT: °-Drive a car °-Operate machinery °-Drink alcoholic beverages °-Take any medication unless instructed by your physician °-Make any legal decisions or sign important papers. ° °Meals: °Start with liquid foods such as gelatin or soup. Progress to regular foods as tolerated. Avoid greasy, spicy, heavy foods. If nausea and/or vomiting occur, drink only clear liquids until the nausea and/or vomiting subsides. Call your physician if vomiting continues. ° °Special Instructions/Symptoms: °Your throat may feel dry or sore from the anesthesia or the breathing tube placed in your throat during surgery. If this causes discomfort, gargle with warm salt water. The discomfort should disappear within 24 hours. ° °If you had a scopolamine patch placed behind your ear for the management of post- operative nausea and/or vomiting: ° °1. The medication in the patch is effective for 72 hours, after which it should be removed.  Wrap patch in a tissue and discard in the trash. Wash hands thoroughly with soap and water. °2. You may remove the patch earlier than 72 hours if you experience unpleasant side effects which may include dry mouth, dizziness or visual disturbances. °3. Avoid touching the patch. Wash your hands with soap and water after contact with the patch. °    ° ° ° ° ° ° ° ° °

## 2020-02-01 NOTE — H&P (Signed)
[name] is an 74 y.o. female.    Chief Complaint: Pre-Op LEFT Shockwave Lithotripsy  HPI:   1 - LEFT UPJ Stone - 71m left UPJ stone by KUB, RUS 01/2020 on follow up of recurrent urolithiasis. UCX negative. Stable Rt pelvic phlebolith on imaging x several. Most recetn Cr 1.06. No anticoagulatns.   Today " VKalista is seen to proceed with LEFT shockwave lithtotripsy. No interval fevers.   Past Medical History:  Diagnosis Date  . Abdominal hernia    per CT 04-28-2018  abdominal/ pelvic anterior wall hernia  . Arthritis   . CKD (chronic kidney disease), stage III   . Disorder of bone and cartilage, unspecified   . Essential hypertension, benign   . Full dentures   . GERD (gastroesophageal reflux disease)   . 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 History of kidney stones   . History of radiation therapy 09-10-2015 to [id number]   left breast 50Gy  . Iron deficiency anemia   . Malignant neoplasm of lower-inner quadrant of left breast in female, estrogen receptor positive ([facility] oncologist-  dr vMathis Dadhiggs ([facility])   dx 08-16-2015--  Stage 1A invasisive (T1c,N0,M0), ER/PR+, HER2 negative---- 06-26-2015 left partial mastectomy , 07-31-2015 left axilla node dissection x1 (negative)-- completed radiation 10-08-2015 and started arimidex 10-16-2015  . Mixed hyperlipidemia   . Nephrolithiasis    bilateral per CT 04-28-2018  . Type 2 diabetes mellitus [place]    followed by [place]cp  . Ureteral calculi    left  . Urgency of urination   . Wears glasses     Past Surgical History:  Procedure Laterality Date  . ABDOMINAL HYSTERECTOMY  2005  . AXILLARY SENTINEL NODE BIOPSY Left 07/31/2015   Procedure: SENTINEL LYMPH NODE BIOPSY, LEFT AXILLA, ;  Surgeon: MAviva Signs MD;  Location: [facility];  Service: General;  Laterality: Left;  Sentinel Node @ 1000  . BREAST BIOPSY Left 06/09/2016   Procedure: LEFT BREAST BIOPSY AFTER NEEDLE LOCALIZATION;   Surgeon: MAviva Signs MD;  Location: [facility];  Service: General;  Laterality: Left;  . CARPAL TUNNEL RELEASE Left 11/02/2014   Procedure: CARPAL TUNNEL RELEASE;  Surgeon: MCharlotte Crumb MD;  [facility] [facility]  Service: Orthopedics;  Laterality: Left;  . CATARACT EXTRACTION W/ INTRAOCULAR LENS  IMPLANT, BILATERAL  2007  . COLONOSCOPY N/A 02/03/2017   Procedure: COLONOSCOPY;  Surgeon: SDanie Binder MD;  [facility];  Service: Endoscopy;  Laterality: N/A;  1245  . COLONOSCOPY W/ BIOPSIES AND POLYPECTOMY    . CYSTOSCOPY WITH RETROGRADE PYELOGRAM, URETEROSCOPY AND STENT PLACEMENT Left 05/09/2018   Procedure: CYSTOSCOPY WITH RETROGRADE PYELOGRAM, URETEROSCOPY AND STENT PLACEMENT;  Surgeon: MCleon Gustin MD;  [facility]  Service: Urology;  Laterality: Left;  . CYSTOSCOPY/URETEROSCOPY/HOLMIUM LASER/STENT PLACEMENT Bilateral 03/18/2017   Procedure: CYSTOSCOPY/ BILATERAL RETROGRADE/RIGHT URETEROSCOPY/ RIGHT HOLMIUM LASER APPLICATION/RIGHT URETERAL STENT PLACEMENT;  Surgeon: JIrine Seal MD;  [facility]: [facility];  Service: Urology;  Laterality: Bilateral;  . EXTRACORPOREAL SHOCK WAVE LITHOTRIPSY Right 12/14/2019   Procedure: EXTRACORPOREAL SHOCK WAVE LITHOTRIPSY (ESWL);  Surgeon: EFestus Aloe MD;  [facility]  Service: Urology;  Laterality: Right;  . HOLMIUM LASER APPLICATION Left 62/09/9416  Procedure: HOLMIUM LASER APPLICATION;  Surgeon: MCleon Gustin MD;  [facility]  Service: Urology;  Laterality: Left;  . OPEN REDUCTION INTERNAL FIXATION (ORIF) DISTAL RADIAL FRACTURE Left 11/02/2014  Procedure: OPEN REDUCTION INTERNAL FIXATION (ORIF) DISTAL RADIAL FRACTURE;  Surgeon: Charlotte Crumb, MD;  Location: [place];  Service: Orthopedics;  Laterality: Left;  . PARTIAL MASTECTOMY WITH NEEDLE LOCALIZATION Left 06/26/2015   Procedure: PARTIAL MASTECTOMY WITH NEEDLE LOCALIZATION;  Surgeon: Aviva Signs, MD;   Location: [facility];  Service: General;  Laterality: Left;  Needle Loc @ 8:00am  . POLYPECTOMY  02/03/2017   Procedure: POLYPECTOMY;  Surgeon: Danie Binder, MD;  [facility];  Service: Endoscopy;;  descending and hepatic flexure  . TARSAL METATARSAL ARTHRODESIS Left 09-07-2007   dr Beola Cord  [facility]   first and second tarsal metatarsal fusion, gastroc slide, neurolysis  . TOTAL ABDOMINAL HYSTERECTOMY W/ BILATERAL SALPINGOOPHORECTOMY  10-21-2004   dr soper  [facility]    Family History  Problem Relation Age of Onset  . Hypertension Mother   . Osteoporosis Mother   . Diabetes Sister        x3  . Colon cancer Neg Hx    Social History:  reports that she quit smoking about 33 years ago. Her smoking use included cigarettes. She has a 20.00 pack-year smoking history. She has never used smokeless tobacco. She reports that she does not drink alcohol or use drugs.  Allergies: No Known Allergies  Medications Prior to Admission  Medication Sig Dispense Refill  . acetaminophen (TYLENOL 8 HOUR ARTHRITIS PAIN) 650 MG CR tablet Take 650 mg by mouth every 8 (eight) hours as needed for pain.    . calcium carbonate (TUMS EX) 750 MG chewable tablet Chew 1 tablet by mouth daily.    Marland Kitchen anastrozole (ARIMIDEX) 1 MG tablet Take 1 tablet by mouth once daily 30 tablet 6  . Blood Glucose Calibration (ACCU-CHEK AVIVA) SOLN     . Blood Glucose Monitoring Suppl (ACCU-CHEK AVIVA PLUS) w/Device KIT     . Cholecalciferol (VITAMIN D3) 1000 units CAPS Take 1,000 Units by mouth at bedtime.    . fenofibrate 54 MG tablet Take 54 mg by mouth daily.     Marland Kitchen glucose blood (ONE TOUCH ULTRA TEST) test strip 1 each 2 (two) times daily. Check blood sugar 1-2 times daily    . glucose blood (PRECISION QID TEST) test strip 1 each 2 (two) times daily. Check Blood sugar 1-2 times daily    . losartan (COZAAR) 100 MG tablet Take 100 mg by mouth every morning.    . meloxicam (MOBIC) 7.5 MG tablet Take 1 tablet (7.5 mg total) by mouth daily.  30 tablet 5  . metFORMIN (GLUCOPHAGE) 1000 MG tablet Take 1,000 mg by mouth 2 (two) times daily with a meal.     . Multiple Vitamin (MULTIVITAMIN WITH MINERALS) TABS tablet Take 1 tablet by mouth every morning. Women's One A Day 50+     . Omega-3 Fatty Acids (FISH OIL) 1200 MG CAPS Take 1,000 mg by mouth 2 (two) times daily.     Marland Kitchen omeprazole (PRILOSEC) 20 MG capsule Take 1 capsule (20 mg total) by mouth daily. 30 capsule 5  . oxyCODONE-acetaminophen (PERCOCET/ROXICET) 5-325 MG tablet     . simvastatin (ZOCOR) 40 MG tablet Take 40 mg by mouth at bedtime.     . tamsulosin (FLOMAX) 0.4 MG CAPS capsule Take 0.4 mg by mouth.      No results found for this or any previous visit (from the past 48 hour(s)). No results found.  Review of Systems  Height '5\' 4"'$  (1.626 m). Physical Exam   Assessment/Plan  Proceed as planned with LEFT  shockwave lithotripsy. Risks, benefits, alternatives, peri-treatment course discussed previously and reiterated today.   Alexis Frock, MD 02/01/2020, 7:06 AM

## 2020-02-01 NOTE — Brief Op Note (Signed)
02/01/2020  9:44 AM  PATIENT:  [name]  74 y.o. female  PRE-OPERATIVE DIAGNOSIS:  LEFT RENAL PELVIC STONE  POST-OPERATIVE DIAGNOSIS:  * No post-op diagnosis entered *  PROCEDURE:  Procedure(s): EXTRACORPOREAL SHOCK WAVE LITHOTRIPSY (ESWL) (Left)  SURGEON:  Surgeon(s) and Role:    Alexis Frock, MD - Primary  PHYSICIAN ASSISTANT:   ASSISTANTS: none   ANESTHESIA:   general  EBL:  minimal   BLOOD ADMINISTERED:none  DRAINS: none   LOCAL MEDICATIONS USED:  MARCAINE     SPECIMEN:  No Specimen  DISPOSITION OF SPECIMEN:  N/A  COUNTS:  YES  TOURNIQUET:  * No tourniquets in log *  DICTATION: .Note written in paper chart  PLAN OF CARE: Discharge to home after PACU  PATIENT DISPOSITION:  PACU - hemodynamically stable.   Delay start of Pharmacological VTE agent (>24hrs) due to surgical blood loss or risk of bleeding: yes

## 2020-02-14 ENCOUNTER — Telehealth: Payer: Self-pay

## 2020-02-14 ENCOUNTER — Ambulatory Visit: Payer: Medicare HMO | Admitting: Gastroenterology

## 2020-02-14 ENCOUNTER — Other Ambulatory Visit: Payer: Self-pay | Admitting: Gastroenterology

## 2020-02-14 ENCOUNTER — Other Ambulatory Visit: Payer: Self-pay

## 2020-02-14 ENCOUNTER — Encounter: Payer: Self-pay | Admitting: Gastroenterology

## 2020-02-14 DIAGNOSIS — Z860100 Personal history of colon polyps, unspecified: Secondary | ICD-10-CM | POA: Insufficient documentation

## 2020-02-14 MED ORDER — PEG 3350-KCL-NA BICARB-NACL 420 G PO SOLR: 4000.0000 mL | ORAL | 0 refills | Status: DC

## 2020-02-14 NOTE — Assessment & Plan Note (Addendum)
Five simple adenomas removed in 2018. NO BRBPR OR MELENA. NO WARNING SIGNS/SYMPTOMS  DRINK WATER TO KEEP YOUR URINE LIGHT YELLOW. FOLLOW A HIGH FIBER DIET.  COMPLETE COLONOSCOPY. Continue metformin. MAY BRING THE ENEMA TO ADMINISTER IN THE PREOP AREA. FOLLOW UP IN THE OFFICE WILL BE SCHEDULED IF NEEDED AFTER ENDOSCOPY.

## 2020-02-14 NOTE — H&P (View-Only) (Signed)
° °Subjective:  ° ° Patient ID: [name], female    DOB: 01/28/1946, 74 y.o.   MRN: [id number] ° °Eksir, Samantha A, MD ° °HPI °HERE FOR SCREENING COLONOSCOPY. BMs:1-2X/DAY. No questions or concerns. Pills can get stuck if she tries to follow them without food. ° °PT DENIES FEVER, CHILLS, HEMATOCHEZIA, HEMATEMESIS, nausea, vomiting, melena, diarrhea, CHEST PAIN, SHORTNESS OF BREATH,  CHANGE IN BOWEL IN HABITS, constipation, abdominal pain, problems swallowing, problems with sedation, OR heartburn or indigestion. ° °Past Medical History:  °Diagnosis Date  °• Abdominal hernia   ° per CT 04-28-2018  abdominal/ pelvic anterior wall hernia  °• Arthritis   °• CKD (chronic kidney disease), stage III   °• Disorder of bone and cartilage, unspecified   °• Essential hypertension, benign   °• Full dentures   °• GERD (gastroesophageal reflux disease)   °• History of benign neoplasm of ovary   ° left fibroadenoma s/p  TAH w/ BSO 2005  °• History of kidney stones   °• History of radiation therapy 09-10-2015 to 11-122-2016  ° left breast 50Gy  °• Iron deficiency anemia   °• Malignant neoplasm of lower-inner quadrant of left breast in female, estrogen receptor positive (HCC) oncologist-  dr vetta higgs ([facility])  ° dx 08-16-2015--  Stage 1A invasisive (T1c,N0,M0), ER/PR+, HER2 negative---- 06-26-2015 left partial mastectomy , 07-31-2015 left axilla node dissection x1 (negative)-- completed radiation 10-08-2015 and started arimidex 10-16-2015  °• Mixed hyperlipidemia   °• Nephrolithiasis   ° bilateral per CT 04-28-2018  °• Type 2 diabetes mellitus (HCC)   ° followed by pcp  °• Ureteral calculi   ° left  °• Urgency of urination   °• Wears glasses   ° ° °Past Surgical History:  °Procedure Laterality Date  °• ABDOMINAL HYSTERECTOMY  2005  °• AXILLARY SENTINEL NODE BIOPSY Left 07/31/2015  ° Procedure: SENTINEL LYMPH NODE BIOPSY, LEFT AXILLA, ;  Surgeon: Mark Jenkins, MD;  [facility]: [facility];  Service: General;  Laterality: Left;   Sentinel Node @ 1000  °• BREAST BIOPSY Left 06/09/2016  ° Procedure: LEFT BREAST BIOPSY AFTER NEEDLE LOCALIZATION;  Surgeon: Mark Jenkins, MD;  Location: [facility];  Service: General;  Laterality: Left;  °• CARPAL TUNNEL RELEASE Left 11/02/2014  ° Procedure: CARPAL TUNNEL RELEASE;  Surgeon: Matthew Weingold, MD;  [facility];  Service: Orthopedics;  Laterality: Left;  °• CATARACT EXTRACTION W/ INTRAOCULAR LENS  IMPLANT, BILATERAL  2007  °• COLONOSCOPY N/A 02/03/2017  ° Procedure: COLONOSCOPY;  Surgeon: Sandi L Fields, MD;  [facility];  Service: Endoscopy;  Laterality: N/A;  1245  °• COLONOSCOPY W/ BIOPSIES AND POLYPECTOMY    °• CYSTOSCOPY WITH RETROGRADE PYELOGRAM, URETEROSCOPY AND STENT PLACEMENT Left 05/09/2018  ° Procedure: CYSTOSCOPY WITH RETROGRADE PYELOGRAM, URETEROSCOPY AND STENT PLACEMENT;  Surgeon: McKenzie, Patrick L, MD;  Location: [facility];  Service: Urology;  Laterality: Left;  °• CYSTOSCOPY/URETEROSCOPY/HOLMIUM LASER/STENT PLACEMENT Bilateral 03/18/2017  ° Procedure: CYSTOSCOPY/ BILATERAL RETROGRADE/RIGHT URETEROSCOPY/ RIGHT HOLMIUM LASER APPLICATION/RIGHT URETERAL STENT PLACEMENT;  Surgeon: John Wrenn, MD;  Location: [facility];  Service: Urology;  Laterality: Bilateral;  °• EXTRACORPOREAL SHOCK WAVE LITHOTRIPSY Right 12/14/2019  ° Procedure: EXTRACORPOREAL SHOCK WAVE LITHOTRIPSY (ESWL);  Surgeon: Eskridge, Matthew, MD;  Location: [facility];  Service: Urology;  Laterality: Right;  °• EXTRACORPOREAL SHOCK WAVE LITHOTRIPSY Left 02/01/2020  ° Procedure: EXTRACORPOREAL SHOCK WAVE LITHOTRIPSY (ESWL);  Surgeon: Manny, Theodore, MD;  Location: [facility];  Service: Urology;  Laterality: Left;  °•   HOLMIUM LASER APPLICATION Left 05/09/2018  ° Procedure: HOLMIUM LASER APPLICATION;  Surgeon: McKenzie, Patrick L, MD;  Location: [facility];  Service: Urology;  Laterality: Left;  °• OPEN REDUCTION INTERNAL FIXATION (ORIF) DISTAL RADIAL FRACTURE  Left 11/02/2014  ° Procedure: OPEN REDUCTION INTERNAL FIXATION (ORIF) DISTAL RADIAL FRACTURE;  Surgeon: Matthew Weingold, MD;  [facility];  Service: Orthopedics;  Laterality: Left;  °• PARTIAL MASTECTOMY WITH NEEDLE LOCALIZATION Left 06/26/2015  ° Procedure: PARTIAL MASTECTOMY WITH NEEDLE LOCALIZATION;  Surgeon: Mark Jenkins, MD;  [facility];  Service: General;  Laterality: Left;  Needle Loc @ 8:00am  °• POLYPECTOMY  02/03/2017  ° Procedure: POLYPECTOMY;  Surgeon: Sandi L Fields, MD;  [facility];  Service: Endoscopy;;  descending and hepatic flexure  °• TARSAL METATARSAL ARTHRODESIS Left 09-07-2007   dr bednarz  [facility]  ° first and second tarsal metatarsal fusion, gastroc slide, neurolysis  °• TOTAL ABDOMINAL HYSTERECTOMY W/ BILATERAL SALPINGOOPHORECTOMY  10-21-2004   dr soper  [facility]  ° °No Known Allergies ° °Current Outpatient Medications  °Medication Sig    °• acetaminophen (TYLENOL 8 HOUR ARTHRITIS PAIN) 650 MG CR tablet Take 500 mg by mouth every 8 (eight) hours as needed for pain.     °• amLODipine-olmesartan (AZOR) 5-40 MG tablet Take 1 tablet by mouth daily.    °• anastrozole (ARIMIDEX) 1 MG tablet Take 1 tablet by mouth once daily    °• Blood Glucose Calibration (ACCU-CHEK AVIVA) SOLN     °• Blood Glucose Monitoring Suppl (ACCU-CHEK AVIVA PLUS) w/Device KIT     °• calcium carbonate (TUMS EX) 750 MG chewable tablet Chew 1 tablet by mouth daily.    °• Cholecalciferol (VITAMIN D3) 1000 units CAPS Take 1,000 Units by mouth at bedtime.    °• fenofibrate 54 MG tablet Take 54 mg by mouth daily.     °• glucose blood (ONE TOUCH ULTRA TEST) test strip 1 each 2 (two) times daily. Check blood sugar 1-2 times daily    °• glucose blood (PRECISION QID TEST) test strip 1 each 2 (two) times daily. Check Blood sugar 1-2 times daily    °• meloxicam (MOBIC) 7.5 MG tablet Take 1 tablet (7.5 mg total) by mouth daily.    °• metFORMIN (GLUCOPHAGE) 1000 MG tablet Take 1,000 mg by mouth 2 (two) times daily with a  meal.     °• Multiple Vitamin (MULTIVITAMIN WITH MINERALS) TABS tablet Take 1 tablet by mouth every morning. Women's One A Day 50+     °• Omega-3 Fatty Acids (FISH OIL) 1200 MG CAPS Take 1,000 mg by mouth 2 (two) times daily.     °• omeprazole (PRILOSEC) 20 MG capsule Take 1 capsule (20 mg total) by mouth daily.    °• oxyCODONE-acetaminophen (PERCOCET/ROXICET) 5-325 MG tablet PRN KIDNEY STONES    °• simvastatin (ZOCOR) 40 MG tablet Take 40 mg by mouth at bedtime.     °• tamsulosin (FLOMAX) 0.4 MG CAPS capsule Take 0.4 mg by mouth PRN KIDNEY STONES    °• losartan (COZAAR) 100 MG tablet Take 100 mg by mouth every morning.    ° °Review of Systems °PER HPI OTHERWISE ALL SYSTEMS ARE NEGATIVE. °   °Objective:  ° Physical Exam °Constitutional:   °   General: She is not in acute distress. °   Appearance: Normal appearance.  °HENT:  °   Mouth/Throat:  °   Comments: MASK IN PLACE °Eyes:  °   General: No scleral icterus. °     Pupils: Pupils are equal, round, and reactive to light.  °Cardiovascular:  °   Rate and Rhythm: Normal rate and regular rhythm.  °   Pulses: Normal pulses.  °   Heart sounds: Normal heart sounds.  °Pulmonary:  °   Effort: Pulmonary effort is normal.  °   Breath sounds: Normal breath sounds.  °Abdominal:  °   General: Bowel sounds are normal.  °   Palpations: Abdomen is soft.  °   Tenderness: There is no abdominal tenderness.  °Musculoskeletal:  °   Cervical back: Normal range of motion.  °   Right lower leg: No edema.  °   Left lower leg: No edema.  °Lymphadenopathy:  °   Cervical: No cervical adenopathy.  °Skin: °   General: Skin is warm and dry.  °Neurological:  °   Mental Status: She is alert and oriented to person, place, and time.  °   Comments: NO  NEW FOCAL DEFICITS  °Psychiatric:     °   Mood and Affect: Mood normal.  °   Comments: NORMAL AFFECT  ° ° °   °Assessment & Plan:  ° °

## 2020-02-14 NOTE — Telephone Encounter (Signed)
PA for TCS submitted via [employer] website. Case went to clinical review. Clinical notes faxed to [facility]. [place] tracking# [id number].

## 2020-02-14 NOTE — Patient Instructions (Signed)
DRINK WATER TO KEEP YOUR URINE LIGHT YELLOW.  FOLLOW A HIGH FIBER DIET.    COMPLETE COLONOSCOPY. YOU MAY BRING THE ENEMA TO ADMINISTER IN THE PREOP AREA.  FOLLOW UP IN THE OFFICE WILL BE SCHEDULED IF NEEDED AFTER ENDOSCOPY.

## 2020-02-14 NOTE — Telephone Encounter (Signed)
TCS approved. [employer]# [id number], valid 03/01/20-03/31/20.

## 2020-02-14 NOTE — Progress Notes (Signed)
° °Subjective:  ° ° Patient ID: [name], female    DOB: 12/27/1945, 73 y.o.   MRN: [id number] ° °Eksir, Samantha A, MD ° °HPI °HERE FOR SCREENING COLONOSCOPY. BMs:1-2X/DAY. No questions or concerns. Pills can get stuck if she tries to follow them without food. ° °PT DENIES FEVER, CHILLS, HEMATOCHEZIA, HEMATEMESIS, nausea, vomiting, melena, diarrhea, CHEST PAIN, SHORTNESS OF BREATH,  CHANGE IN BOWEL IN HABITS, constipation, abdominal pain, problems swallowing, problems with sedation, OR heartburn or indigestion. ° °Past Medical History:  °Diagnosis Date  °• Abdominal hernia   ° per CT 04-28-2018  abdominal/ pelvic anterior wall hernia  °• Arthritis   °• CKD (chronic kidney disease), stage III   °• Disorder of bone and cartilage, unspecified   °• Essential hypertension, benign   °• Full dentures   °• GERD (gastroesophageal reflux disease)   °• History of benign neoplasm of ovary   ° left fibroadenoma s/p  TAH w/ BSO 2005  °• History of kidney stones   °• History of radiation therapy 09-10-2015 to 11-122-2016  ° left breast 50Gy  °• Iron deficiency anemia   °• Malignant neoplasm of lower-inner quadrant of left breast in female, estrogen receptor positive (HCC) oncologist-  dr vetta higgs ([facility])  ° dx 08-16-2015--  Stage 1A invasisive (T1c,N0,M0), ER/PR+, HER2 negative---- 06-26-2015 left partial mastectomy , 07-31-2015 left axilla node dissection x1 (negative)-- completed radiation 10-08-2015 and started arimidex 10-16-2015  °• Mixed hyperlipidemia   °• Nephrolithiasis   ° bilateral per CT 04-28-2018  °• Type 2 diabetes mellitus (HCC)   ° followed by pcp  °• Ureteral calculi   ° left  °• Urgency of urination   °• Wears glasses   ° ° °Past Surgical History:  °Procedure Laterality Date  °• ABDOMINAL HYSTERECTOMY  2005  °• AXILLARY SENTINEL NODE BIOPSY Left 07/31/2015  ° Procedure: SENTINEL LYMPH NODE BIOPSY, LEFT AXILLA, ;  Surgeon: Mark Jenkins, MD;  [facility];  Service: General;  Laterality: Left;   Sentinel Node @ 1000  °• BREAST BIOPSY Left 06/09/2016  ° Procedure: LEFT BREAST BIOPSY AFTER NEEDLE LOCALIZATION;  Surgeon: Mark Jenkins, MD;  [facility];  Service: General;  Laterality: Left;  °• CARPAL TUNNEL RELEASE Left 11/02/2014  ° Procedure: CARPAL TUNNEL RELEASE;  Surgeon: Matthew Weingold, MD;  [facility];  Service: Orthopedics;  Laterality: Left;  °• CATARACT EXTRACTION W/ INTRAOCULAR LENS  IMPLANT, BILATERAL  2007  °• COLONOSCOPY N/A 02/03/2017  ° Procedure: COLONOSCOPY;  Surgeon: Lyndsie Wallman L Rogue Rafalski, MD;  [facility];  Service: Endoscopy;  Laterality: N/A;  1245  °• COLONOSCOPY W/ BIOPSIES AND POLYPECTOMY    °• CYSTOSCOPY WITH RETROGRADE PYELOGRAM, URETEROSCOPY AND STENT PLACEMENT Left 05/09/2018  ° Procedure: CYSTOSCOPY WITH RETROGRADE PYELOGRAM, URETEROSCOPY AND STENT PLACEMENT;  Surgeon: McKenzie, Patrick L, MD;  Location: [facility];  Service: Urology;  Laterality: Left;  °• CYSTOSCOPY/URETEROSCOPY/HOLMIUM LASER/STENT PLACEMENT Bilateral 03/18/2017  ° Procedure: CYSTOSCOPY/ BILATERAL RETROGRADE/RIGHT URETEROSCOPY/ RIGHT HOLMIUM LASER APPLICATION/RIGHT URETERAL STENT PLACEMENT;  Surgeon: John Wrenn, MD;  [facility];  Service: Urology;  Laterality: Bilateral;  °• EXTRACORPOREAL SHOCK WAVE LITHOTRIPSY Right 12/14/2019  ° Procedure: EXTRACORPOREAL SHOCK WAVE LITHOTRIPSY (ESWL);  Surgeon: Eskridge, Matthew, MD;  Location: [facility];  Service: Urology;  Laterality: Right;  °• EXTRACORPOREAL SHOCK WAVE LITHOTRIPSY Left 02/01/2020  ° Procedure: EXTRACORPOREAL SHOCK WAVE LITHOTRIPSY (ESWL);  Surgeon: Manny, Theodore, MD;  Location: [facility];  Service: Urology;  Laterality: Left;  °•   HOLMIUM LASER APPLICATION Left 05/09/2018  ° Procedure: HOLMIUM LASER APPLICATION;  Surgeon: McKenzie, Patrick L, MD;  Location: [facility];  Service: Urology;  Laterality: Left;  °• OPEN REDUCTION INTERNAL FIXATION (ORIF) DISTAL RADIAL FRACTURE  Left 11/02/2014  ° Procedure: OPEN REDUCTION INTERNAL FIXATION (ORIF) DISTAL RADIAL FRACTURE;  Surgeon: Matthew Weingold, MD;  [facility];  Service: Orthopedics;  Laterality: Left;  °• PARTIAL MASTECTOMY WITH NEEDLE LOCALIZATION Left 06/26/2015  ° Procedure: PARTIAL MASTECTOMY WITH NEEDLE LOCALIZATION;  Surgeon: Mark Jenkins, MD;  [facility];  Service: General;  Laterality: Left;  Needle Loc @ 8:00am  °• POLYPECTOMY  02/03/2017  ° Procedure: POLYPECTOMY;  Surgeon: Nestor Wieneke L Chelci [place], MD;  [facility];  Service: Endoscopy;;  descending and hepatic flexure  °• TARSAL METATARSAL ARTHRODESIS Left 09-07-2007   dr bednarz  [facility]  ° first and second tarsal metatarsal fusion, gastroc slide, neurolysis  °• TOTAL ABDOMINAL HYSTERECTOMY W/ BILATERAL SALPINGOOPHORECTOMY  10-21-2004   dr soper  [facility]  ° °No Known Allergies ° °Current Outpatient Medications  °Medication Sig    °• acetaminophen (TYLENOL 8 HOUR ARTHRITIS PAIN) 650 MG CR tablet Take 500 mg by mouth every 8 (eight) hours as needed for pain.     °• amLODipine-olmesartan (AZOR) 5-40 MG tablet Take 1 tablet by mouth daily.    °• anastrozole (ARIMIDEX) 1 MG tablet Take 1 tablet by mouth once daily    °• Blood Glucose Calibration (ACCU-CHEK AVIVA) SOLN     °• Blood Glucose Monitoring Suppl (ACCU-CHEK AVIVA PLUS) w/Device KIT     °• calcium carbonate (TUMS EX) 750 MG chewable tablet Chew 1 tablet by mouth daily.    °• Cholecalciferol (VITAMIN D3) 1000 units CAPS Take 1,000 Units by mouth at bedtime.    °• fenofibrate 54 MG tablet Take 54 mg by mouth daily.     °• glucose blood (ONE TOUCH ULTRA TEST) test strip 1 each 2 (two) times daily. Check blood sugar 1-2 times daily    °• glucose blood (PRECISION QID TEST) test strip 1 each 2 (two) times daily. Check Blood sugar 1-2 times daily    °• meloxicam (MOBIC) 7.5 MG tablet Take 1 tablet (7.5 mg total) by mouth daily.    °• metFORMIN (GLUCOPHAGE) 1000 MG tablet Take 1,000 mg by mouth 2 (two) times daily with a  meal.     °• Multiple Vitamin (MULTIVITAMIN WITH MINERALS) TABS tablet Take 1 tablet by mouth every morning. Women's One A Day 50+     °• Omega-3 Fatty Acids (FISH OIL) 1200 MG CAPS Take 1,000 mg by mouth 2 (two) times daily.     °• omeprazole (PRILOSEC) 20 MG capsule Take 1 capsule (20 mg total) by mouth daily.    °• oxyCODONE-acetaminophen (PERCOCET/ROXICET) 5-325 MG tablet PRN KIDNEY STONES    °• simvastatin (ZOCOR) 40 MG tablet Take 40 mg by mouth at bedtime.     °• tamsulosin (FLOMAX) 0.4 MG CAPS capsule Take 0.4 mg by mouth PRN KIDNEY STONES    °• losartan (COZAAR) 100 MG tablet Take 100 mg by mouth every morning.    ° °Review of Systems °PER HPI OTHERWISE ALL SYSTEMS ARE NEGATIVE. °   °Objective:  ° Physical Exam °Constitutional:   °   General: She is not in acute distress. °   Appearance: Normal appearance.  °HENT:  °   Mouth/Throat:  °   Comments: MASK IN PLACE °Eyes:  °   General: No scleral icterus. °     Pupils: Pupils are equal, round, and reactive to light.  Cardiovascular:     Rate and Rhythm: Normal rate and regular rhythm.     Pulses: Normal pulses.     Heart sounds: Normal heart sounds.  Pulmonary:     Effort: Pulmonary effort is normal.     Breath sounds: Normal breath sounds.  Abdominal:     General: Bowel sounds are normal.     Palpations: Abdomen is soft.     Tenderness: There is no abdominal tenderness.  Musculoskeletal:     Cervical back: Normal range of motion.     Right lower leg: No edema.     Left lower leg: No edema.  Lymphadenopathy:     Cervical: No cervical adenopathy.  Skin:    General: Skin is warm and dry.  Neurological:     Mental Status: She is alert and oriented to person, place, and time.     Comments: NO  NEW FOCAL DEFICITS  Psychiatric:        Mood and Affect: Mood normal.     Comments: NORMAL AFFECT       Assessment & Plan:

## 2020-02-14 NOTE — Progress Notes (Signed)
Cc'ed to pcp °

## 2020-02-15 MED ORDER — PEG 3350-KCL-NA BICARB-NACL 420 G PO SOLR: 4000.0000 mL | ORAL | 0 refills | Status: DC

## 2020-02-15 NOTE — Telephone Encounter (Signed)
Spoke with pt and she advised me to send Rx to [facility]. rx sent

## 2020-02-15 NOTE — Telephone Encounter (Signed)
Sending to [facility] clinical pool to address. Procedure is scheduled for 03/01/20.

## 2020-02-27 ENCOUNTER — Other Ambulatory Visit: Payer: Self-pay

## 2020-02-27 ENCOUNTER — Other Ambulatory Visit (HOSPITAL_COMMUNITY)
Admission: RE | Admit: 2020-02-27 | Discharge: 2020-02-27 | Disposition: A | Discharge status: Home / Self Care | Payer: Medicare HMO | Source: Ambulatory Visit | Attending: Gastroenterology | Admitting: Gastroenterology

## 2020-02-27 DIAGNOSIS — Z01812 Encounter for preprocedural laboratory examination: Secondary | ICD-10-CM | POA: Insufficient documentation

## 2020-02-27 DIAGNOSIS — Z20822 Contact with and (suspected) exposure to covid-19: Secondary | ICD-10-CM | POA: Diagnosis not present

## 2020-02-28 LAB — SARS CORONAVIRUS 2 (TAT 6-24 HRS): SARS Coronavirus 2: NEGATIVE

## 2020-03-01 ENCOUNTER — Encounter (HOSPITAL_COMMUNITY): Payer: Self-pay | Admitting: Gastroenterology

## 2020-03-01 ENCOUNTER — Other Ambulatory Visit: Payer: Self-pay

## 2020-03-01 ENCOUNTER — Encounter (HOSPITAL_COMMUNITY)
Admission: RE | Disposition: A | Discharge status: Home / Self Care | Payer: Self-pay | Source: Home / Self Care | Attending: Gastroenterology

## 2020-03-01 ENCOUNTER — Ambulatory Visit (HOSPITAL_COMMUNITY)
Admission: RE | Admit: 2020-03-01 | Discharge: 2020-03-01 | Disposition: A | Discharge status: Home / Self Care | Payer: Medicare HMO | Attending: Gastroenterology | Admitting: Gastroenterology

## 2020-03-01 DIAGNOSIS — E782 Mixed hyperlipidemia: Secondary | ICD-10-CM | POA: Insufficient documentation

## 2020-03-01 DIAGNOSIS — Q438 Other specified congenital malformations of intestine: Secondary | ICD-10-CM | POA: Diagnosis not present

## 2020-03-01 DIAGNOSIS — I129 Hypertensive chronic kidney disease with stage 1 through stage 4 chronic kidney disease, or unspecified chronic kidney disease: Secondary | ICD-10-CM | POA: Diagnosis not present

## 2020-03-01 DIAGNOSIS — K644 Residual hemorrhoidal skin tags: Secondary | ICD-10-CM | POA: Insufficient documentation

## 2020-03-01 DIAGNOSIS — Z7984 Long term (current) use of oral hypoglycemic drugs: Secondary | ICD-10-CM | POA: Diagnosis not present

## 2020-03-01 DIAGNOSIS — Z853 Personal history of malignant neoplasm of breast: Secondary | ICD-10-CM | POA: Insufficient documentation

## 2020-03-01 DIAGNOSIS — D122 Benign neoplasm of ascending colon: Secondary | ICD-10-CM | POA: Insufficient documentation

## 2020-03-01 DIAGNOSIS — K648 Other hemorrhoids: Secondary | ICD-10-CM | POA: Diagnosis not present

## 2020-03-01 DIAGNOSIS — N183 Chronic kidney disease, stage 3 unspecified: Secondary | ICD-10-CM | POA: Diagnosis not present

## 2020-03-01 DIAGNOSIS — Z791 Long term (current) use of non-steroidal anti-inflammatories (NSAID): Secondary | ICD-10-CM | POA: Insufficient documentation

## 2020-03-01 DIAGNOSIS — Z86010 Personal history of colonic polyps: Secondary | ICD-10-CM | POA: Diagnosis not present

## 2020-03-01 DIAGNOSIS — D123 Benign neoplasm of transverse colon: Secondary | ICD-10-CM | POA: Diagnosis not present

## 2020-03-01 DIAGNOSIS — M1990 Unspecified osteoarthritis, unspecified site: Secondary | ICD-10-CM | POA: Diagnosis not present

## 2020-03-01 DIAGNOSIS — Z79899 Other long term (current) drug therapy: Secondary | ICD-10-CM | POA: Diagnosis not present

## 2020-03-01 DIAGNOSIS — D128 Benign neoplasm of rectum: Secondary | ICD-10-CM | POA: Diagnosis not present

## 2020-03-01 DIAGNOSIS — K219 Gastro-esophageal reflux disease without esophagitis: Secondary | ICD-10-CM | POA: Diagnosis not present

## 2020-03-01 DIAGNOSIS — K635 Polyp of colon: Secondary | ICD-10-CM

## 2020-03-01 DIAGNOSIS — Z79811 Long term (current) use of aromatase inhibitors: Secondary | ICD-10-CM | POA: Diagnosis not present

## 2020-03-01 DIAGNOSIS — K621 Rectal polyp: Secondary | ICD-10-CM

## 2020-03-01 DIAGNOSIS — Z1211 Encounter for screening for malignant neoplasm of colon: Secondary | ICD-10-CM | POA: Diagnosis present

## 2020-03-01 DIAGNOSIS — E1122 Type 2 diabetes mellitus with diabetic chronic kidney disease: Secondary | ICD-10-CM | POA: Insufficient documentation

## 2020-03-01 HISTORY — PX: COLONOSCOPY: SHX5424

## 2020-03-01 HISTORY — PX: POLYPECTOMY: SHX5525

## 2020-03-01 LAB — GLUCOSE, CAPILLARY: Glucose-Capillary: 117 mg/dL — ABNORMAL HIGH (ref 70–99)

## 2020-03-01 SURGERY — COLONOSCOPY
Anesthesia: Moderate Sedation

## 2020-03-01 MED ORDER — MIDAZOLAM HCL 5 MG/5ML IJ SOLN
INTRAMUSCULAR | Status: DC | PRN
  Administered 2020-03-01 (×2): 2 mg via INTRAVENOUS

## 2020-03-01 MED ORDER — MEPERIDINE HCL 100 MG/ML IJ SOLN
INTRAMUSCULAR | Status: AC
  Filled 2020-03-01: qty 2

## 2020-03-01 MED ORDER — STERILE WATER FOR IRRIGATION IR SOLN
Status: DC | PRN
  Administered 2020-03-01: 2.5 mL

## 2020-03-01 MED ORDER — SODIUM CHLORIDE 0.9 % IV SOLN: INTRAVENOUS | Status: DC

## 2020-03-01 MED ORDER — MEPERIDINE HCL 100 MG/ML IJ SOLN
INTRAMUSCULAR | Status: DC | PRN
  Administered 2020-03-01 (×2): 25 mg via INTRAVENOUS

## 2020-03-01 MED ORDER — MIDAZOLAM HCL 5 MG/5ML IJ SOLN
INTRAMUSCULAR | Status: AC
  Filled 2020-03-01: qty 10

## 2020-03-01 NOTE — Discharge Instructions (Signed)
You have small internal hemorrhoids. YOU HAD FOUR SMALL POLYPS REMOVED.    DRINK WATER TO KEEP YOUR URINE LIGHT YELLOW.  FOLLOW A HIGH FIBER DIET. AVOID ITEMS THAT CAUSE BLOATING. See info below.   USE PREPARATION H FOUR TIMES  A DAY IF NEEDED TO RELIEVE RECTAL PAIN/PRESSURE/BLEEDING.   YOUR BIOPSY RESULTS WILL BE BACK IN 5 BUSINESS DAYS.  We do not routinely screen for polyps after the age of 68. Colonoscopy Care After Read the instructions outlined below and refer to this sheet in the next week. These discharge instructions provide you with general information on caring for yourself after you leave the hospital. While your treatment has been planned according to the most current medical practices available, unavoidable complications occasionally occur. If you have any problems or questions after discharge, call DR. FIELDS, [phone].  ACTIVITY  You may resume your regular activity, but move at a slower pace for the next 24 hours.   Take frequent rest periods for the next 24 hours.   Walking will help get rid of the air and reduce the bloated feeling in your belly (abdomen).   No driving for 24 hours (because of the medicine (anesthesia) used during the test).   You may shower.   Do not sign any important legal documents or operate any machinery for 24 hours (because of the anesthesia used during the test).    NUTRITION  Drink plenty of fluids.   You may resume your normal diet as instructed by your doctor.   Begin with a light meal and progress to your normal diet. Heavy or fried foods are harder to digest and may make you feel sick to your stomach (nauseated).   Avoid alcoholic beverages for 24 hours or as instructed.    MEDICATIONS  You may resume your normal medications.   WHAT YOU CAN EXPECT TODAY  Some feelings of bloating in the abdomen.   Passage of more gas than usual.   Spotting of blood in your stool or on the toilet paper  .  IF YOU HAD POLYPS  REMOVED DURING THE COLONOSCOPY:  Eat a soft diet IF YOU HAVE NAUSEA, BLOATING, ABDOMINAL PAIN, OR VOMITING.    FINDING OUT THE RESULTS OF YOUR TEST Not all test results are available during your visit. DR. Oneida Alar WILL CALL YOU WITHIN 14 DAYS OF YOUR PROCEDUE WITH YOUR RESULTS. Do not assume everything is normal if you have not heard from DR. FIELDS, CALL HER OFFICE AT [phone].  SEEK IMMEDIATE MEDICAL ATTENTION AND CALL THE OFFICE: [phone] IF:  You have more than a spotting of blood in your stool.   Your belly is swollen (abdominal distention).   You are nauseated or vomiting.   You have a temperature over 101F.   You have abdominal pain or discomfort that is severe or gets worse throughout the day.  High-Fiber Diet A high-fiber diet changes your normal diet to include more whole grains, legumes, fruits, and vegetables. Changes in the diet involve replacing refined carbohydrates with unrefined foods. The calorie level of the diet is essentially unchanged. The Dietary Reference Intake (recommended amount) for adult males is 38 grams per day. For adult females, it is 25 grams per day. Pregnant and lactating women should consume 28 grams of fiber per day. Fiber is the intact part of a plant that is not broken down during digestion. Functional fiber is fiber that has been isolated from the plant to provide a beneficial effect in the body.  PURPOSE  Increase stool bulk.   Ease and regulate bowel movements.   Lower cholesterol.   REDUCE RISK OF COLON CANCER  INDICATIONS THAT YOU NEED MORE FIBER  Constipation and hemorrhoids.   Uncomplicated diverticulosis (intestine condition) and irritable bowel syndrome.   Weight management.   As a protective measure against hardening of the arteries (atherosclerosis), diabetes, and cancer.   GUIDELINES FOR INCREASING FIBER IN THE DIET  Start adding fiber to the diet slowly. A gradual increase of about 5 more grams (2 servings of  most fruits or vegetables) per day is best. Too rapid an increase in fiber may result in constipation, flatulence, and bloating.   Drink enough water and fluids to keep your urine clear or pale yellow. Water, juice, or caffeine-free drinks are recommended. Not drinking enough fluid may cause constipation.   Eat a variety of high-fiber foods rather than one type of fiber.   Try to increase your intake of fiber through using high-fiber foods rather than fiber pills or supplements that contain small amounts of fiber.   The goal is to change the types of food eaten. Do not supplement your present diet with high-fiber foods, but replace foods in your present diet.   Polyps, Colon  A polyp is extra tissue that grows inside your body. Colon polyps grow in the large intestine. The large intestine, also called the colon, is part of your digestive system. It is a long, hollow tube at the end of your digestive tract where your body makes and stores stool. Most polyps are not dangerous. They are benign. This means they are not cancerous. But over time, some types of polyps can turn into cancer. Polyps that are smaller than a pea are usually not harmful. But larger polyps could someday become or may already be cancerous. To be safe, doctors remove all polyps and test them.   PREVENTION There is not one sure way to prevent polyps. You might be able to lower your risk of getting them if you:  Eat more fruits and vegetables and less fatty food.   Do not smoke.   Avoid alcohol.   Exercise every day.   Lose weight if you are overweight.   Eating more calcium and folate can also lower your risk of getting polyps. Some foods that are rich in calcium are milk, cheese, and broccoli. Some foods that are rich in folate are chickpeas, kidney beans, and spinach.

## 2020-03-01 NOTE — Op Note (Addendum)
[facility] Patient Name: [name] Procedure Date: 03/01/2020 10:13 AM MRN: [id number] Date of Birth: 1946-04-28 Attending MD: Barney Drain MD, MD CSN: [id number] Age: 74 Admit Type: Outpatient Procedure:                Colonoscopy WITH COLD SNARE POLYPECTOMY Indications:              Personal history of colonic polyps Providers:                Barney Drain MD, MD, Lurline Del, RN, Nelma Rothman,                            Technician Referring MD:             Earnest Conroy. Eksir Medicines:                Meperidine 50 mg IV, Midazolam 4 mg IV Complications:            No immediate complications. Estimated Blood Loss:     Estimated blood loss was minimal. Procedure:                Pre-Anesthesia Assessment:                           - Prior to the procedure, a History and Physical                            was performed, and patient medications and                            allergies were reviewed. The patient's tolerance of                            previous anesthesia was also reviewed. The risks                            and benefits of the procedure and the sedation                            options and risks were discussed with the patient.                            All questions were answered, and informed consent                            was obtained. Prior Anticoagulants: The patient has                            taken no previous anticoagulant or antiplatelet                            agents. ASA Grade Assessment: II - A patient with                            mild systemic disease. After reviewing the risks  and benefits, the patient was deemed in                            satisfactory condition to undergo the procedure.                            After obtaining informed consent, the colonoscope                            was passed under direct vision. Throughout the                            procedure, the patient's blood pressure,  pulse, and                            oxygen saturations were monitored continuously. The                            [id number]-[id number]) scope was introduced through                            the anus and advanced to the the cecum, identified                            by appendiceal orifice and ileocecal valve. The                            colonoscopy was somewhat difficult due to a                            tortuous colon. Successful completion of the                            procedure was aided by using manual pressure,                            straightening and shortening the scope to obtain                            bowel loop reduction and COLOWRAP. The patient                            tolerated the procedure fairly well. The quality of                            the bowel preparation was good. The ileocecal                            valve, appendiceal orifice, and rectum were                            photographed. Scope In: 10:26:33 AM Scope Out: 10:50:28 AM Scope Withdrawal Time: 0 hours 18 minutes 27 seconds  Total Procedure Duration: 0 hours  23 minutes 55 seconds  Findings:      Four sessile polyps were found in the rectum, splenic flexure(2) and       ascending colon. The polyps were 2 to 4 mm in size. These polyps were       removed with a cold snare. Resection and retrieval were complete.      External and internal hemorrhoids were found.      The recto-sigmoid colon and sigmoid colon were moderately tortuous. Impression:               - FOUR 2 to 4 mm polyps in the rectum, at the                            splenic flexure and in the ascending colon, removed                            with a cold snare. Resected and retrieved.                           - External and internal hemorrhoids.                           - Tortuous colon. Moderate Sedation:      Moderate (conscious) sedation was administered by the endoscopy nurse       and supervised by the  endoscopist. The following parameters were       monitored: oxygen saturation, heart rate, blood pressure, and response       to care. Total physician intraservice time was 36 minutes. Recommendation:           - Patient has a contact number available for                            emergencies. The signs and symptoms of potential                            delayed complications were discussed with the                            patient. Return to normal activities tomorrow.                            Written discharge instructions were provided to the                            patient.                           - High fiber diet.                           - Continue present medications.                           - Await pathology results.                           - Repeat  colonoscopy is not recommended due to                            current age (22 years or older) for surveillance. Procedure Code(s):        --- Professional ---                           [id number], Colonoscopy, flexible; with removal of                            tumor(s), polyp(s), or other lesion(s) by snare                            technique                           99153, Moderate sedation; each additional 15                            minutes intraservice time                           G0500, Moderate sedation services provided by the                            same physician or other qualified health care                            professional performing a gastrointestinal                            endoscopic service that sedation supports,                            requiring the presence of an independent trained                            observer to assist in the monitoring of the                            patient's level of consciousness and physiological                            status; initial 15 minutes of intra-service time;                            patient age 30 years or older (additional time  may                            be reported with [id number], as appropriate) Diagnosis Code(s):        --- Professional ---                           K62.1, Rectal polyp  K63.5, Polyp of colon                           K64.8, Other hemorrhoids                           Z86.010, Personal history of colonic polyps                           Q43.8, Other specified congenital malformations of                            intestine CPT copyright 2019 American Medical Association. All rights reserved. The codes documented in this report are preliminary and upon coder review may  be revised to meet current compliance requirements. Barney Drain, MD Barney Drain MD, MD 03/01/2020 11:22:10 AM This report has been signed electronically. Number of Addenda: 0

## 2020-03-01 NOTE — Interval H&P Note (Signed)
History and Physical Interval Note:  03/01/2020 10:09 AM  [name]  has presented today for surgery, with the diagnosis of history of polyps.  The various methods of treatment have been discussed with the patient and family. After consideration of risks, benefits and other options for treatment, the patient has consented to  Procedure(s) with comments: COLONOSCOPY (N/A) - 10:30am as a surgical intervention.  The patient's history has been reviewed, patient examined, no change in status, stable for surgery.  I have reviewed the patient's chart and labs.  Questions were answered to the patient's satisfaction.     [employer]

## 2020-03-04 LAB — SURGICAL PATHOLOGY

## 2020-04-18 ENCOUNTER — Other Ambulatory Visit (HOSPITAL_COMMUNITY): Payer: Self-pay | Admitting: Nurse Practitioner

## 2020-04-18 DIAGNOSIS — Z170 Estrogen receptor positive status [ER+]: Secondary | ICD-10-CM

## 2020-04-24 ENCOUNTER — Other Ambulatory Visit (HOSPITAL_COMMUNITY): Payer: Self-pay | Admitting: Nurse Practitioner

## 2020-04-24 DIAGNOSIS — Z9889 Other specified postprocedural states: Secondary | ICD-10-CM

## 2020-05-14 ENCOUNTER — Ambulatory Visit: Payer: Medicare HMO

## 2020-05-14 ENCOUNTER — Encounter: Payer: Self-pay | Admitting: Orthopaedic Surgery

## 2020-05-14 ENCOUNTER — Ambulatory Visit: Payer: Medicare HMO | Admitting: Orthopaedic Surgery

## 2020-05-14 ENCOUNTER — Other Ambulatory Visit: Payer: Self-pay

## 2020-05-14 VITALS — BP 157/79 | HR 94 | Ht 65.0 in | Wt 180.0 lb

## 2020-05-14 DIAGNOSIS — G8929 Other chronic pain: Secondary | ICD-10-CM

## 2020-05-14 DIAGNOSIS — M25511 Pain in right shoulder: Secondary | ICD-10-CM | POA: Diagnosis not present

## 2020-05-14 NOTE — Progress Notes (Signed)
Patient NF:[name], female DOB:[birth year], 74 y.o. [id number]  Chief Complaint  Patient presents with  . Shoulder Injury    Right, injured in Feb putting wood in heater then a couple weeks ago lifting something heard a pop    HPI  [name] is a 74 y.o. female who hurt her right shoulder in February while putting wood in her heater at home.  She continued to have pain and was seen by her primary care doctor, Dr. Daron Offer.  She was seen again by Dr. Daron Offer on June 9.  I have reviewed the notes.  She has pain in the right shoulder with no radiation, no swelling, no redness.  She has no other trauma, no other joint problem.   Body mass index is 29.95 kg/m.  ROS  Review of Systems  Genitourinary: Positive for flank pain.    All other systems reviewed and are negative.  The following is a summary of the past history medically, past history surgically, known current medicines, social history and family history.  This information is gathered electronically by the computer from prior information and documentation.  I review this each visit and have found including this information at this point in the chart is beneficial and informative.    Past Medical History:  Diagnosis Date  . Abdominal hernia    per CT 04-28-2018  abdominal/ pelvic anterior wall hernia  . Arthritis   . CKD (chronic kidney disease), stage III   . Disorder of bone and cartilage, unspecified   . Essential hypertension, benign   . Full dentures   . GERD (gastroesophageal reflux disease)   . 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 History of kidney stones   . History of radiation therapy 09-10-2015 to [phone]   left breast 50Gy  . Iron deficiency anemia   . Malignant neoplasm of lower-inner quadrant of left breast in female, estrogen receptor positive [facility]) oncologist-  dr Mathis Dad higgs ([facility])   dx 08-16-2015--  Stage 1A invasisive (T1c,N0,M0), ER/PR+,  HER2 negative---- 06-26-2015 left partial mastectomy , 07-31-2015 left axilla node dissection x1 (negative)-- completed radiation 10-08-2015 and started arimidex 10-16-2015  . Mixed hyperlipidemia   . Nephrolithiasis    bilateral per CT 04-28-2018  . Type 2 diabetes mellitus ([place])    followed by pcp  . Ureteral calculi    left  . Urgency of urination   . Wears glasses     Past Surgical History:  Procedure Laterality Date  . ABDOMINAL HYSTERECTOMY  2005  . AXILLARY SENTINEL NODE BIOPSY Left 07/31/2015   Procedure: SENTINEL LYMPH NODE BIOPSY, LEFT AXILLA, ;  Surgeon: Aviva Signs, MD;  Location: [facility];  Service: General;  Laterality: Left;  Sentinel Node @ 1000  . BREAST BIOPSY Left 06/09/2016   Procedure: LEFT BREAST BIOPSY AFTER NEEDLE LOCALIZATION;  Surgeon: Aviva Signs, MD;  Location: [facility];  Service: General;  Laterality: Left;  . CARPAL TUNNEL RELEASE Left 11/02/2014   Procedure: CARPAL TUNNEL RELEASE;  Surgeon: Charlotte Crumb, MD;  Location: [place];  Service: Orthopedics;  Laterality: Left;  . CATARACT EXTRACTION W/ INTRAOCULAR LENS  IMPLANT, BILATERAL  2007  . COLONOSCOPY N/A 02/03/2017   Procedure: COLONOSCOPY;  Surgeon: Danie Binder, MD;  [facility];  Service: Endoscopy;  Laterality: N/A;  1245  . COLONOSCOPY N/A 03/01/2020   Procedure: COLONOSCOPY;  Surgeon: Danie Binder, MD;  [facility];  Service: Endoscopy;  Laterality: N/A;  10:30am  . COLONOSCOPY W/ BIOPSIES AND POLYPECTOMY    . CYSTOSCOPY WITH RETROGRADE PYELOGRAM, URETEROSCOPY AND STENT PLACEMENT Left 05/09/2018   Procedure: CYSTOSCOPY WITH RETROGRADE PYELOGRAM, URETEROSCOPY AND STENT PLACEMENT;  Surgeon: Cleon Gustin, MD;  Location: [facility];  Service: Urology;  Laterality: Left;  . CYSTOSCOPY/URETEROSCOPY/HOLMIUM LASER/STENT PLACEMENT Bilateral 03/18/2017   Procedure: CYSTOSCOPY/ BILATERAL RETROGRADE/RIGHT URETEROSCOPY/ RIGHT HOLMIUM LASER APPLICATION/RIGHT URETERAL  STENT PLACEMENT;  Surgeon: Irine Seal, MD;  Location: [facility];  Service: Urology;  Laterality: Bilateral;  . EXTRACORPOREAL SHOCK WAVE LITHOTRIPSY Right 12/14/2019   Procedure: EXTRACORPOREAL SHOCK WAVE LITHOTRIPSY (ESWL);  Surgeon: Festus Aloe, MD;  Location: [facility];  Service: Urology;  Laterality: Right;  . EXTRACORPOREAL SHOCK WAVE LITHOTRIPSY Left 02/01/2020   Procedure: EXTRACORPOREAL SHOCK WAVE LITHOTRIPSY (ESWL);  Surgeon: Alexis Frock, MD;  Location: [facility];  Service: Urology;  Laterality: Left;  . HOLMIUM LASER APPLICATION Left 5/91/6384   Procedure: HOLMIUM LASER APPLICATION;  Surgeon: Cleon Gustin, MD;  Location: [facility];  Service: Urology;  Laterality: Left;  . OPEN REDUCTION INTERNAL FIXATION (ORIF) DISTAL RADIAL FRACTURE Left 11/02/2014   Procedure: OPEN REDUCTION INTERNAL FIXATION (ORIF) DISTAL RADIAL FRACTURE;  Surgeon: Charlotte Crumb, MD;  Location: [place];  Service: Orthopedics;  Laterality: Left;  . PARTIAL MASTECTOMY WITH NEEDLE LOCALIZATION Left 06/26/2015   Procedure: PARTIAL MASTECTOMY WITH NEEDLE LOCALIZATION;  Surgeon: Aviva Signs, MD;  [facility];  Service: General;  Laterality: Left;  Needle Loc @ 8:00am  . POLYPECTOMY  02/03/2017   Procedure: POLYPECTOMY;  Surgeon: Danie Binder, MD;  [facility];  Service: Endoscopy;;  descending and hepatic flexure  . POLYPECTOMY  03/01/2020   Procedure: POLYPECTOMY;  Surgeon: Danie Binder, MD;  [facility];  Service: Endoscopy;;  ascending;splenic flexure polyps x2;rectal  . TARSAL METATARSAL ARTHRODESIS Left 09-07-2007   dr Beola Cord  [facility]   first and second tarsal metatarsal fusion, gastroc slide, neurolysis  . TOTAL ABDOMINAL HYSTERECTOMY W/ BILATERAL SALPINGOOPHORECTOMY  10-21-2004   dr soper  [facility]    Family History  Problem Relation Age of Onset  . Hypertension Mother   . Osteoporosis Mother   . Diabetes Sister        x3   . Colon cancer Neg Hx     Social History Social History   Tobacco Use  . Smoking status: Former Smoker    Packs/day: 1.00    Years: 20.00    Pack years: 20.00    Types: Cigarettes    Quit date: 06/05/1986    Years since quitting: 33.9  . Smokeless tobacco: Never Used  Vaping Use  . Vaping Use: Never used  Substance Use Topics  . Alcohol use: No  . Drug use: No    No Known Allergies  Current Outpatient Medications  Medication Sig Dispense Refill  . acetaminophen (TYLENOL 8 HOUR ARTHRITIS PAIN) 650 MG CR tablet Take 500 mg by mouth every 8 (eight) hours as needed for pain.     Marland Kitchen amLODipine-olmesartan (AZOR) 5-40 MG tablet Take 1 tablet by mouth daily.    Marland Kitchen anastrozole (ARIMIDEX) 1 MG tablet Take 1 tablet by mouth once daily (Patient taking differently: Take 1 mg by mouth daily. ) 30 tablet 6  . Blood Glucose Calibration (ACCU-CHEK AVIVA) SOLN     . Blood Glucose Monitoring Suppl (ACCU-CHEK AVIVA PLUS) w/Device KIT     . calcium carbonate (TUMS  EX) 750 MG chewable tablet Chew 750 mg by mouth daily.     . Camphor-Menthol-Methyl Sal (SALONPAS) 3.11-21-08 % PTCH Apply 1 patch topically daily as needed (pain).    . Cholecalciferol (VITAMIN D3) 1000 units CAPS Take 1,000 Units by mouth daily.     . Coenzyme Q10 (CO Q 10 PO) Take 1 tablet by mouth daily.    Marland Kitchen glucose blood (ONE TOUCH ULTRA TEST) test strip 1 each 2 (two) times daily. Check blood sugar 1-2 times daily    . glucose blood (PRECISION QID TEST) test strip 1 each 2 (two) times daily. Check Blood sugar 1-2 times daily    . ibuprofen (ADVIL) 200 MG tablet Take 200 mg by mouth every 6 (six) hours as needed.    . metFORMIN (GLUCOPHAGE) 1000 MG tablet Take 1,000 mg by mouth 2 (two) times daily with a meal.     . Multiple Vitamin (MULTIVITAMIN WITH MINERALS) TABS tablet Take 1 tablet by mouth every morning. Women's One A Day 50+     . Omega-3 Fatty Acids (FISH OIL) 1200 MG CAPS Take 1,000 mg by mouth 2 (two) times daily.     Marland Kitchen  omeprazole (PRILOSEC) 20 MG capsule Take 1 capsule (20 mg total) by mouth daily. (Patient taking differently: Take 10 mg by mouth daily. ) 30 capsule 5  . simvastatin (ZOCOR) 40 MG tablet Take 40 mg by mouth at bedtime.     . fenofibrate 54 MG tablet Take 54 mg by mouth daily.     . meloxicam (MOBIC) 7.5 MG tablet Take 1 tablet (7.5 mg total) by mouth daily. (Patient not taking: Reported on 05/14/2020) 30 tablet 5   No current facility-administered medications for this visit.     Physical Exam  Blood pressure (!) 157/79, pulse 94, height _0  (1.651 m), weight 180 lb (81.6 kg).  Constitutional: overall normal hygiene, normal nutrition, well developed, normal grooming, normal body habitus. Assistive device:none  Musculoskeletal: gait and station Limp none, muscle tone and strength are normal, no tremors or atrophy is present.  .  Neurological: coordination overall normal.  Deep tendon reflex/nerve stretch intact.  Sensation normal.  Cranial nerves II-XII intact.   Skin:   Normal overall no scars, lesions, ulcers or rashes. No psoriasis.  Psychiatric: Alert and oriented x 3.  Recent memory intact, remote memory unclear.  Normal mood and affect. Well groomed.  Good eye contact.  Cardiovascular: overall no swelling, no varicosities, no edema bilaterally, normal temperatures of the legs and arms, no clubbing, cyanosis and good capillary refill.  Lymphatic: palpation is normal.  Examination of right Upper Extremity is done.  Inspection:   Overall:  Elbow non-tender without crepitus or defects, forearm non-tender without crepitus or defects, wrist non-tender without crepitus or defects, hand non-tender.    Shoulder: with glenohumeral joint tenderness, without effusion.   Upper arm:  without swelling and tenderness   Range of motion:   Overall:  Full range of motion of the elbow, full range of motion of wrist and full range of motion in fingers.   Shoulder:  right  120 degrees forward  flexion; 100 degrees abduction; 20 degrees internal rotation, 20 degrees external rotation, 10 degrees extension, 40 degrees adduction.   Stability:   Overall:  Shoulder, elbow and wrist stable   Strength and Tone:   Overall full shoulder muscles strength, full upper arm strength and normal upper arm bulk and tone.  All other systems reviewed and are negative   The  patient has been educated about the nature of the problem(s) and counseled on treatment options.  The patient appeared to understand what I have discussed and is in agreement with it.  Encounter Diagnosis  Name Primary?  . Chronic right shoulder pain Yes  x-rays were done of the right shoulder, reported separately.  She has calcific bursitis.  PROCEDURE NOTE:  The patient request injection, verbal consent was obtained.  The right shoulder was prepped appropriately after time out was performed.   Sterile technique was observed and injection of 1 cc of Depo-Medrol 40 mg with several cc's of plain xylocaine. Anesthesia was provided by ethyl chloride and a 20-gauge needle was used to inject the shoulder area. A posterior approach was used.  The injection was tolerated well.  A band aid dressing was applied.  The patient was advised to apply ice later today and tomorrow to the injection sight as needed.    PLAN Call if any problems.  Precautions discussed.  Continue current medications.   Return to clinic 2 weeks   Electronically Signed Sanjuana Kava, MD 6/29/202111:28 AM

## 2020-05-17 ENCOUNTER — Other Ambulatory Visit (HOSPITAL_COMMUNITY): Payer: Self-pay | Admitting: *Deleted

## 2020-05-17 DIAGNOSIS — C50312 Malignant neoplasm of lower-inner quadrant of left female breast: Secondary | ICD-10-CM

## 2020-05-21 ENCOUNTER — Encounter (HOSPITAL_COMMUNITY): Payer: Medicare Other

## 2020-05-21 ENCOUNTER — Other Ambulatory Visit: Payer: Self-pay

## 2020-05-21 ENCOUNTER — Ambulatory Visit (HOSPITAL_COMMUNITY)
Admission: RE | Admit: 2020-05-21 | Discharge: 2020-05-21 | Disposition: A | Discharge status: Home / Self Care | Payer: Medicare HMO | Source: Ambulatory Visit | Attending: Nurse Practitioner | Admitting: Nurse Practitioner

## 2020-05-21 ENCOUNTER — Inpatient Hospital Stay (HOSPITAL_COMMUNITY): Payer: Medicare HMO | Attending: Hematology

## 2020-05-21 ENCOUNTER — Ambulatory Visit (HOSPITAL_COMMUNITY)
Admission: RE | Admit: 2020-05-21 | Discharge: 2020-05-21 | Disposition: A | Discharge status: Home / Self Care | Payer: Medicare HMO | Source: Ambulatory Visit | Attending: Hematology | Admitting: Hematology

## 2020-05-21 DIAGNOSIS — N183 Chronic kidney disease, stage 3 unspecified: Secondary | ICD-10-CM | POA: Diagnosis not present

## 2020-05-21 DIAGNOSIS — Z9221 Personal history of antineoplastic chemotherapy: Secondary | ICD-10-CM | POA: Diagnosis not present

## 2020-05-21 DIAGNOSIS — Z923 Personal history of irradiation: Secondary | ICD-10-CM | POA: Diagnosis not present

## 2020-05-21 DIAGNOSIS — Z170 Estrogen receptor positive status [ER+]: Secondary | ICD-10-CM | POA: Insufficient documentation

## 2020-05-21 DIAGNOSIS — C50312 Malignant neoplasm of lower-inner quadrant of left female breast: Secondary | ICD-10-CM | POA: Insufficient documentation

## 2020-05-21 DIAGNOSIS — Z9889 Other specified postprocedural states: Secondary | ICD-10-CM | POA: Diagnosis present

## 2020-05-21 DIAGNOSIS — Z87891 Personal history of nicotine dependence: Secondary | ICD-10-CM | POA: Diagnosis not present

## 2020-05-21 DIAGNOSIS — Z79899 Other long term (current) drug therapy: Secondary | ICD-10-CM | POA: Insufficient documentation

## 2020-05-21 DIAGNOSIS — Z79811 Long term (current) use of aromatase inhibitors: Secondary | ICD-10-CM | POA: Insufficient documentation

## 2020-05-21 DIAGNOSIS — I129 Hypertensive chronic kidney disease with stage 1 through stage 4 chronic kidney disease, or unspecified chronic kidney disease: Secondary | ICD-10-CM | POA: Insufficient documentation

## 2020-05-21 DIAGNOSIS — R5383 Other fatigue: Secondary | ICD-10-CM | POA: Diagnosis not present

## 2020-05-21 LAB — VITAMIN D 25 HYDROXY (VIT D DEFICIENCY, FRACTURES): Vit D, 25-Hydroxy: 33.11 ng/mL (ref 30–100)

## 2020-05-21 LAB — CBC WITH DIFFERENTIAL/PLATELET
Abs Immature Granulocytes: 0.03 10*3/uL (ref 0.00–0.07)
Basophils Absolute: 0 10*3/uL (ref 0.0–0.1)
Basophils Relative: 0 %
Eosinophils Absolute: 0.1 10*3/uL (ref 0.0–0.5)
Eosinophils Relative: 1 %
HCT: 38.9 % (ref 36.0–46.0)
Hemoglobin: 12.2 g/dL (ref 12.0–15.0)
Immature Granulocytes: 1 %
Lymphocytes Relative: 18 %
Lymphs Abs: 1.2 10*3/uL (ref 0.7–4.0)
MCH: 28.8 pg (ref 26.0–34.0)
MCHC: 31.4 g/dL (ref 30.0–36.0)
MCV: 91.7 fL (ref 80.0–100.0)
Monocytes Absolute: 0.7 10*3/uL (ref 0.1–1.0)
Monocytes Relative: 10 %
Neutro Abs: 4.5 10*3/uL (ref 1.7–7.7)
Neutrophils Relative %: 70 %
Platelets: 216 10*3/uL (ref 150–400)
RBC: 4.24 MIL/uL (ref 3.87–5.11)
RDW: 14.8 % (ref 11.5–15.5)
WBC: 6.4 10*3/uL (ref 4.0–10.5)
nRBC: 0 % (ref 0.0–0.2)

## 2020-05-21 LAB — COMPREHENSIVE METABOLIC PANEL
ALT: 23 U/L (ref 0–44)
AST: 24 U/L (ref 15–41)
Albumin: 4.1 g/dL (ref 3.5–5.0)
Alkaline Phosphatase: 49 U/L (ref 38–126)
Anion gap: 10 (ref 5–15)
BUN: 27 mg/dL — ABNORMAL HIGH (ref 8–23)
CO2: 27 mmol/L (ref 22–32)
Calcium: 9.9 mg/dL (ref 8.9–10.3)
Chloride: 103 mmol/L (ref 98–111)
Creatinine, Ser: 1.17 mg/dL — ABNORMAL HIGH (ref 0.44–1.00)
GFR calc Af Amer: 53 mL/min — ABNORMAL LOW (ref >60)
GFR calc non Af Amer: 46 mL/min — ABNORMAL LOW (ref >60)
Glucose, Bld: 113 mg/dL — ABNORMAL HIGH (ref 70–99)
Potassium: 4.3 mmol/L (ref 3.5–5.1)
Sodium: 140 mmol/L (ref 135–145)
Total Bilirubin: 0.4 mg/dL (ref 0.3–1.2)
Total Protein: 7 g/dL (ref 6.5–8.1)

## 2020-05-21 LAB — LACTATE DEHYDROGENASE: LDH: 159 U/L (ref 98–192)

## 2020-05-28 ENCOUNTER — Encounter: Payer: Self-pay | Admitting: Orthopaedic Surgery

## 2020-05-28 ENCOUNTER — Other Ambulatory Visit: Payer: Self-pay

## 2020-05-28 ENCOUNTER — Inpatient Hospital Stay (HOSPITAL_COMMUNITY): Payer: Medicare HMO | Admitting: Nurse Practitioner

## 2020-05-28 ENCOUNTER — Ambulatory Visit (HOSPITAL_COMMUNITY): Payer: Medicare Other | Admitting: Hematology

## 2020-05-28 ENCOUNTER — Ambulatory Visit: Payer: Medicare HMO | Admitting: Orthopaedic Surgery

## 2020-05-28 VITALS — BP 133/61 | HR 93 | Temp 97.3°F | Resp 17 | Wt 178.1 lb

## 2020-05-28 VITALS — Ht 65.0 in | Wt 177.0 lb

## 2020-05-28 DIAGNOSIS — Z170 Estrogen receptor positive status [ER+]: Secondary | ICD-10-CM

## 2020-05-28 DIAGNOSIS — G8929 Other chronic pain: Secondary | ICD-10-CM | POA: Diagnosis not present

## 2020-05-28 DIAGNOSIS — M25511 Pain in right shoulder: Secondary | ICD-10-CM

## 2020-05-28 DIAGNOSIS — M8580 Other specified disorders of bone density and structure, unspecified site: Secondary | ICD-10-CM

## 2020-05-28 DIAGNOSIS — C50312 Malignant neoplasm of lower-inner quadrant of left female breast: Secondary | ICD-10-CM | POA: Diagnosis not present

## 2020-05-28 NOTE — Assessment & Plan Note (Signed)
1.  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 -Radiation therapy from 09/10/2015 through 10/08/2015. -Anastrozole started in November 2016 -Mammogram on 05/21/2020 was B RADS category 3. -Ultrasound of the left breast on 05/21/2020 was B RADS category 3. -We will order repeat mammogram and possible ultrasound in 6 months. -Labs done on 05/21/2020 were all WNL. -We will see her in 6 months with repeat mammogram and labs.  2.  Osteopenia: -DEXA scan 09/08/2018 showed T score of -0.4. -She is taking Tums twice daily.  She also takes vitamin D 1000 units daily. -We will repeat a DEXA scan on her 26-monthvisit.

## 2020-05-28 NOTE — Patient Instructions (Signed)
[facility] Discharge Instructions     Thank you for choosing [facility] at [facility] to provide your oncology and hematology care.  To afford each patient quality time with our provider, please arrive at least 15 minutes before your scheduled appointment time.   If you have a lab appointment with the Cancer Center please come in thru the Main Entrance and check in at the main information desk.  You need to re-schedule your appointment should you arrive 10 or more minutes late.  We strive to give you quality time with our providers, and arriving late affects you and other patients whose appointments are after yours.  Also, if you no show three or more times for appointments you may be dismissed from the clinic at the providers discretion.     Again, thank you for choosing [facility].  Our hope is that these requests will decrease the amount of time that you wait before being seen by our physicians.       _____________________________________________________________  Should you have questions after your visit to  Cancer Center, please contact our office at [phone] between the hours of 8:00 a.m. and 4:30 p.m.  Voicemails left after 4:00 p.m. will not be returned until the following business day.  For prescription refill requests, have your pharmacy contact our office and allow 72 hours.    Due to Covid, you will need to wear a mask upon entering the hospital. If you do not have a mask, a mask will be given to you at the Main Entrance upon arrival. For doctor visits, patients may have 1 support person with them. For treatment visits, patients can not have anyone with them due to social distancing guidelines and our immunocompromised population.      

## 2020-05-28 NOTE — Progress Notes (Signed)
[facility] [place], [place] [address]   CLINIC:  Medical Oncology/Hematology  PCP:  Nickola Major, MD [address] [place] [address] [phone]   REASON FOR VISIT: Follow-up for breast cancer   CURRENT THERAPY: Anastrozole  BRIEF ONCOLOGIC HISTORY:  Oncology History  Breast cancer ([place]) (Resolved)  08/16/2015 Initial Diagnosis   Breast cancer ([place])   09/11/2015 -  Radiation Therapy   Dr. 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 ER+ 100%, PR+ 80%   05/02/2015 Procedure   Left breast stereotactic core needle biopsy   06/26/2015 Oncotype testing   ONCOTYPE DX assay with recurrence score result of 5, Low risk, 10 year risk of distance recurrence tamoxifen alone 5%   06/26/2015 Definitive Surgery   Breast partial mastectomy after needle localization with Dr. Arnoldo Morale no sentinel node performed   06/26/2015 Pathology Results   INVASIVE DUCTAL CARCINOMA, 2 CM. - HIGH GRADE DUCTAL CARCINOMA IN SITU. - INVASIVE CARCINOMA FOCALLY LESS THAN 0.1 CM FROM LATERAL MARGIN. - DUCTAL CARCINOMA IN SITU FOCALLY LESS THAN 0.1 CM FROM THE LATERAL MARGIN.   06/26/2015 Pathology Results   ER+ 100%, PR + 60%, Ki-67 25%, HER2 NEGATIVE   07/31/2015 Procedure   Sentinel node procedure   07/31/2015 Pathology Results   ONE BENIGN LYMPH NODE (0/1).   08/26/2015 Imaging   Bone density- Normal   09/10/2015 - 10/08/2015 Radiation Therapy   50 Gy left breast by Dr. Isidore Moos.   10/16/2015 -  Anti-estrogen oral therapy   Arimidex   05/08/2016 Procedure   Stereotactic-guided biopsies of the calcifications of left breast.     05/08/2016 Pathology Results   Breast, left, needle core biopsy, lateral and ant to lumpectomy site FIBROCYSTIC CHANGES VASCULAR CALCIFICATIONS NO EVIDENCE OF MALIGNANCY   05/08/2016 Pathology Results   2. Breast, left, needle core biopsy, lumpectomy site FAT NECROSIS WITH FIBROSIS AND MICROCALCIFICATIONS NO EVIDENCE OF MALIGNANCY   06/09/2016 Surgery   Left breast biopsy for microcalcifications. Path: benign.     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 Hendricks 74 y.o. female returns for routine follow-up for breast cancer.  Patient reports she has been doing well since her last visit.  She denies any new lumps or bumps present.  She denies any new bone pain. Denies any nausea, vomiting, or diarrhea. Denies any new pains. Had not noticed any recent bleeding such as epistaxis, hematuria or hematochezia. Denies recent chest pain on exertion, shortness of breath on minimal exertion, pre-syncopal episodes, or palpitations. Denies any numbness or tingling in hands or feet. Denies any recent fevers, infections, or recent hospitalizations. Patient reports appetite at 100% and energy level at 0%.  She is eating well maintain her weight at this time.     REVIEW OF SYSTEMS:  Review of Systems  Constitutional: Positive for fatigue.     PAST MEDICAL/SURGICAL HISTORY:  Past Medical History:  Diagnosis Date  . Abdominal hernia  per CT 04-28-2018  abdominal/ pelvic anterior wall hernia  . Arthritis   . CKD (chronic kidney disease), stage III   . Disorder of bone and cartilage, unspecified   . Essential hypertension, benign   . Full dentures   . GERD (gastroesophageal reflux disease)   . 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  History of kidney stones   . History of radiation therapy 09-10-2015 to [phone]   left breast 50Gy  . Iron deficiency anemia   . Malignant neoplasm of lower-inner quadrant of left breast in female, estrogen receptor positive [facility]) oncologist-  dr Mathis Dad higgs ([facility])   dx 08-16-2015--  Stage 1A invasisive (T1c,N0,M0), ER/PR+, HER2 negative---- 06-26-2015 left partial mastectomy , 07-31-2015 left axilla node dissection x1 (negative)-- completed radiation 10-08-2015 and started arimidex 10-16-2015  . Mixed hyperlipidemia   . Nephrolithiasis    bilateral per CT 04-28-2018  . Type 2 diabetes mellitus ([place])    followed by pcp  . Ureteral calculi    left  . Urgency of urination   . Wears glasses    Past Surgical History:  Procedure Laterality Date  . ABDOMINAL HYSTERECTOMY  2005  . AXILLARY SENTINEL NODE BIOPSY Left 07/31/2015   Procedure: SENTINEL LYMPH NODE BIOPSY, LEFT AXILLA, ;  Surgeon: Aviva Signs, MD;  Location: [facility];  Service: General;  Laterality: Left;  Sentinel Node @ 1000  . BREAST BIOPSY Left 06/09/2016   Procedure: LEFT BREAST BIOPSY AFTER NEEDLE LOCALIZATION;  Surgeon: Aviva Signs, MD;  Location: [facility];  Service: General;  Laterality: Left;  . CARPAL TUNNEL RELEASE Left 11/02/2014   Procedure: CARPAL TUNNEL RELEASE;  Surgeon: Charlotte Crumb, MD;  Location: [facility];  Service: Orthopedics;  Laterality: Left;  . CATARACT EXTRACTION W/ INTRAOCULAR LENS  IMPLANT, BILATERAL  2007  . COLONOSCOPY N/A 02/03/2017   Procedure: COLONOSCOPY;  Surgeon: Danie Binder, MD;  [facility];  Service: Endoscopy;  Laterality: N/A;  1245  . COLONOSCOPY N/A 03/01/2020   Procedure: COLONOSCOPY;  Surgeon: Danie Binder, MD;  [facility];  Service: Endoscopy;  Laterality: N/A;  10:30am  . COLONOSCOPY W/ BIOPSIES AND POLYPECTOMY    . CYSTOSCOPY WITH RETROGRADE PYELOGRAM, URETEROSCOPY AND STENT PLACEMENT Left 05/09/2018   Procedure: CYSTOSCOPY WITH RETROGRADE  PYELOGRAM, URETEROSCOPY AND STENT PLACEMENT;  Surgeon: Cleon Gustin, MD;  Location: [facility];  Service: Urology;  Laterality: Left;  . CYSTOSCOPY/URETEROSCOPY/HOLMIUM LASER/STENT PLACEMENT Bilateral 03/18/2017   Procedure: CYSTOSCOPY/ BILATERAL RETROGRADE/RIGHT URETEROSCOPY/ RIGHT HOLMIUM LASER APPLICATION/RIGHT URETERAL STENT PLACEMENT;  Surgeon: Irine Seal, MD;  Location: [facility];  Service: Urology;  Laterality: Bilateral;  . EXTRACORPOREAL SHOCK WAVE LITHOTRIPSY Right 12/14/2019   Procedure: EXTRACORPOREAL SHOCK WAVE LITHOTRIPSY (ESWL);  Surgeon: Festus Aloe, MD;  Location[facility];  Service: Urology;  Laterality: Right;  . EXTRACORPOREAL SHOCK WAVE LITHOTRIPSY Left 02/01/2020   Procedure: EXTRACORPOREAL SHOCK WAVE LITHOTRIPSY (ESWL);  Surgeon: Alexis Frock, MD;  Location: [facility];  Service: Urology;  Laterality: Left;  . HOLMIUM LASER APPLICATION Left 9/39/0300   Procedure: HOLMIUM LASER APPLICATION;  Surgeon: Cleon Gustin, MD;  Location[facility];  Service: Urology;  Laterality: Left;  . OPEN REDUCTION INTERNAL FIXATION (ORIF) DISTAL RADIAL FRACTURE Left 11/02/2014   Procedure: OPEN REDUCTION INTERNAL FIXATION (ORIF) DISTAL RADIAL FRACTURE;  Surgeon: Charlotte Crumb, MD;  Location: [facility];  Service: Orthopedics;  Laterality: Left;  .  PARTIAL MASTECTOMY WITH NEEDLE LOCALIZATION Left 06/26/2015   Procedure: PARTIAL MASTECTOMY WITH NEEDLE LOCALIZATION;  Surgeon: Aviva Signs, MD;  Location: [facility];  Service: General;  Laterality: Left;  Needle Loc @ 8:00am  . POLYPECTOMY  02/03/2017   Procedure: POLYPECTOMY;  Surgeon: Danie Binder, MD;  [facility];  Service: Endoscopy;;  descending and hepatic flexure  . POLYPECTOMY  03/01/2020   Procedure: POLYPECTOMY;  Surgeon: Danie Binder, MD;  [facility];  Service: Endoscopy;;  ascending;splenic flexure polyps x2;rectal  . TARSAL METATARSAL  ARTHRODESIS Left 09-07-2007   dr Beola Cord  [facility]   first and second tarsal metatarsal fusion, gastroc slide, neurolysis  . TOTAL ABDOMINAL HYSTERECTOMY W/ BILATERAL SALPINGOOPHORECTOMY  10-21-2004   dr soper  [facility]     SOCIAL HISTORY:  Social History   Socioeconomic History  . Marital status: Married    Spouse name: hubert  . Number of children: 2  . Years of education: gef  . Highest education level: Not on file  Occupational History  . Occupation: Retired  Tobacco Use  . Smoking status: Former Smoker    Packs/day: 1.00    Years: 20.00    Pack years: 20.00    Types: Cigarettes    Quit date: 06/05/1986    Years since quitting: 34.0  . Smokeless tobacco: Never Used  Vaping Use  . Vaping Use: Never used  Substance and Sexual Activity  . Alcohol use: No  . Drug use: No  . Sexual activity: Never    Birth control/protection: Surgical  Other Topics Concern  . Not on file  Social History Narrative  . Not on file   Social Determinants of Health   Financial Resource Strain:   . Difficulty of Paying Living Expenses:   Food Insecurity:   . Worried About Charity fundraiser in the Last Year:   . Arboriculturist in the Last Year:   Transportation Needs:   . Film/video editor (Medical):   Marland Kitchen Lack of Transportation (Non-Medical):   Physical Activity:   . Days of Exercise per Week:   . Minutes of Exercise per Session:   Stress:   . Feeling of Stress :   Social Connections:   . Frequency of Communication with Friends and Family:   . Frequency of Social Gatherings with Friends and Family:   . Attends Religious Services:   . Active Member of Clubs or Organizations:   . Attends Archivist Meetings:   Marland Kitchen Marital Status:   Intimate Partner Violence:   . Fear of Current or Ex-Partner:   . Emotionally Abused:   Marland Kitchen Physically Abused:   . Sexually Abused:     FAMILY HISTORY:  Family History  Problem Relation Age of Onset  . Hypertension Mother   . Osteoporosis  Mother   . Diabetes Sister        x3  . Colon cancer Neg Hx     CURRENT MEDICATIONS:  Outpatient Encounter Medications as of 05/28/2020  Medication Sig  . amLODipine-olmesartan (AZOR) 5-40 MG tablet Take 1 tablet by mouth daily.  Marland Kitchen anastrozole (ARIMIDEX) 1 MG tablet Take 1 tablet by mouth once daily (Patient taking differently: Take 1 mg by mouth daily. )  . Blood Glucose Calibration (ACCU-CHEK AVIVA) SOLN   . Blood Glucose Monitoring Suppl (ACCU-CHEK AVIVA PLUS) w/Device KIT   . calcium carbonate (TUMS EX) 750 MG chewable tablet Chew 750 mg by mouth daily.   . Cholecalciferol (VITAMIN  D3) 1000 units CAPS Take 1,000 Units by mouth daily.   . Coenzyme Q10 (CO Q 10 PO) Take 1 tablet by mouth daily.  . fenofibrate 54 MG tablet Take 54 mg by mouth daily.   Marland Kitchen glucose blood (ONE TOUCH ULTRA TEST) test strip 1 each 2 (two) times daily. Check blood sugar 1-2 times daily  . glucose blood (PRECISION QID TEST) test strip 1 each 2 (two) times daily. Check Blood sugar 1-2 times daily  . metFORMIN (GLUCOPHAGE) 1000 MG tablet Take 1,000 mg by mouth 2 (two) times daily with a meal.   . Multiple Vitamin (MULTIVITAMIN WITH MINERALS) TABS tablet Take 1 tablet by mouth every morning. Women's One A Day 50+   . Omega-3 Fatty Acids (FISH OIL) 1200 MG CAPS Take 1,000 mg by mouth 2 (two) times daily.   Marland Kitchen omeprazole (PRILOSEC) 20 MG capsule Take 1 capsule (20 mg total) by mouth daily. (Patient taking differently: Take 10 mg by mouth daily. )  . simvastatin (ZOCOR) 40 MG tablet Take 40 mg by mouth at bedtime.   Marland Kitchen acetaminophen (TYLENOL 8 HOUR ARTHRITIS PAIN) 650 MG CR tablet Take 500 mg by mouth every 8 (eight) hours as needed for pain.  (Patient not taking: Reported on 05/28/2020)  . Camphor-Menthol-Methyl Sal (SALONPAS) 3.11-21-08 % PTCH Apply 1 patch topically daily as needed (pain). (Patient not taking: Reported on 05/28/2020)  . ibuprofen (ADVIL) 200 MG tablet Take 200 mg by mouth every 6 (six) hours as needed.  (Patient not taking: Reported on 05/28/2020)  . [DISCONTINUED] meloxicam (MOBIC) 7.5 MG tablet Take 1 tablet (7.5 mg total) by mouth daily. (Patient not taking: Reported on 05/14/2020)   No facility-administered encounter medications on file as of 05/28/2020.    ALLERGIES:  No Known Allergies   PHYSICAL EXAM:  ECOG Performance status: 1  Vitals:   05/28/20 1117  BP: 133/61  Pulse: 93  Resp: 17  Temp: (!) 97.3 F (36.3 C)  SpO2: 95%   Filed Weights   05/28/20 1117  Weight: 178 lb 1.6 oz (80.8 kg)   Physical Exam Constitutional:      Appearance: Normal appearance. She is normal weight.  Cardiovascular:     Rate and Rhythm: Normal rate and regular rhythm.     Heart sounds: Normal heart sounds.  Pulmonary:     Effort: Pulmonary effort is normal.     Breath sounds: Normal breath sounds.  Abdominal:     General: Bowel sounds are normal.     Palpations: Abdomen is soft.  Musculoskeletal:        General: Normal range of motion.  Skin:    General: Skin is warm.  Neurological:     Mental Status: She is alert and oriented to person, place, and time. Mental status is at baseline.  Psychiatric:        Mood and Affect: Mood normal.        Behavior: Behavior normal.        Thought Content: Thought content normal.        Judgment: Judgment normal.      LABORATORY DATA:  I have reviewed the labs as listed.  CBC    Component Value Date/Time   WBC 6.4 05/21/2020 0917   RBC 4.24 05/21/2020 0917   HGB 12.2 05/21/2020 0917   HCT 38.9 05/21/2020 0917   PLT 216 05/21/2020 0917   MCV 91.7 05/21/2020 0917   MCH 28.8 05/21/2020 0917   MCHC 31.4 05/21/2020 0917   RDW  14.8 05/21/2020 0917   LYMPHSABS 1.2 05/21/2020 0917   MONOABS 0.7 05/21/2020 0917   EOSABS 0.1 05/21/2020 0917   BASOSABS 0.0 05/21/2020 0917   CMP Latest Ref Rng & Units 05/21/2020 11/30/2019 05/16/2019  Glucose 70 - 99 mg/dL 113(H) 112(H) 161(H)  BUN 8 - 23 mg/dL 27(H) 15 26(H)  Creatinine 0.44 - 1.00 mg/dL  1.17(H) 1.06(H) 1.12(H)  Sodium 135 - 145 mmol/L 140 143 141  Potassium 3.5 - 5.1 mmol/L 4.3 4.1 4.3  Chloride 98 - 111 mmol/L 103 108 100  CO2 22 - 32 mmol/L _0 Calcium 8.9 - 10.3 mg/dL 9.9 9.8 10.0  Total Protein 6.5 - 8.1 g/dL 7.0 6.9 6.8  Total Bilirubin 0.3 - 1.2 mg/dL 0.4 0.6 0.4  Alkaline Phos 38 - 126 U/L 49 59 58  AST 15 - 41 U/L _1 ALT 0 - 44 U/L _2 DIAGNOSTIC IMAGING:  I have independently reviewed the mammogram scans and discussed with the patient.  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 -Radiation therapy from 09/10/2015 through 10/08/2015. -Anastrozole started in November 2016 -Mammogram on 05/21/2020 was B RADS category 3. -Ultrasound of the left breast on 05/21/2020 was B RADS category 3. -We will order repeat mammogram and possible ultrasound in 6 months. -Labs done on 05/21/2020 were all WNL. -We will see her in 6 months with repeat mammogram and labs.  2.  Osteopenia: -DEXA scan 09/08/2018 showed T score of -0.4. -She is taking Tums twice daily.  She also takes vitamin D 1000 units daily. -We will repeat a DEXA scan on her 42-monthvisit.     Orders placed this encounter:  Orders Placed This Encounter  Procedures  . MM DIAG BREAST TOMO UNI LEFT  . DG Bone Density  . Lactate dehydrogenase  . CBC with Differential/Platelet  . Comprehensive metabolic panel  . Vitamin B12  . VITAMIN D 25 Hydroxy (Vit-D Deficiency, Fractures)      RFrancene Finders FNP-C [phone]

## 2020-05-28 NOTE — Progress Notes (Signed)
I feel better.  She is doing well with the right shoulder.  The injection last time really helped.  She has near full ROM with tenderness at the extremes.  NV intact.  Encounter Diagnosis  Name Primary?   Chronic right shoulder pain Yes   I will see as needed.  Call if any problem.  Precautions discussed.   Electronically Signed Sanjuana Kava, MD 7/13/20212:06 PM

## 2020-07-07 ENCOUNTER — Other Ambulatory Visit (HOSPITAL_COMMUNITY): Payer: Self-pay | Admitting: Hematology

## 2020-07-07 DIAGNOSIS — C50312 Malignant neoplasm of lower-inner quadrant of left female breast: Secondary | ICD-10-CM

## 2020-08-01 ENCOUNTER — Encounter: Payer: Self-pay | Admitting: Orthopaedic Surgery

## 2020-08-01 ENCOUNTER — Ambulatory Visit (INDEPENDENT_AMBULATORY_CARE_PROVIDER_SITE_OTHER): Payer: Medicare HMO | Admitting: Orthopaedic Surgery

## 2020-08-01 ENCOUNTER — Other Ambulatory Visit: Payer: Self-pay

## 2020-08-01 ENCOUNTER — Ambulatory Visit: Payer: Medicare HMO

## 2020-08-01 VITALS — BP 155/77 | HR 89 | Ht 65.0 in | Wt 174.0 lb

## 2020-08-01 DIAGNOSIS — M25561 Pain in right knee: Secondary | ICD-10-CM | POA: Diagnosis not present

## 2020-08-01 NOTE — Progress Notes (Signed)
Subjective:    Patient ID: [name], female    DOB: Mar 24, 1946, 74 y.o.   MRN: [id number]  HPI She stepped out of her truck about two weeks ago and felt a "pop" in the back of the right knee.  It has been hurting since then. She has swelling and pain. She has giving way which began last week.  She has no redness, no numbness.  She went to see her family doctor today and he asked she be seen today.  She has history of some sciatica.    She has tried Tylenol, Advil, rubs, ice and heat without help.  She says she does not like the giving way and falling.   Review of Systems  Constitutional: Positive for activity change.  Musculoskeletal: Positive for arthralgias, gait problem and joint swelling.  All other systems reviewed and are negative.  For Review of Systems, all other systems reviewed and are negative.  The following is a summary of the past history medically, past history surgically, known current medicines, social history and family history.  This information is gathered electronically by the computer from prior information and documentation.  I review this each visit and have found including this information at this point in the chart is beneficial and informative.   Past Medical History:  Diagnosis Date  . Abdominal hernia    per CT 04-28-2018  abdominal/ pelvic anterior wall hernia  . Arthritis   . CKD (chronic kidney disease), stage III   . Disorder of bone and cartilage, unspecified   . Essential hypertension, benign   . Full dentures   . GERD (gastroesophageal reflux disease)   . 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 History of kidney stones   . History of radiation therapy 09-10-2015 to [phone]   left breast 50Gy  . Iron deficiency anemia   . Malignant neoplasm of lower-inner quadrant of left breast in female, estrogen receptor positive [facility]) oncologist-  dr Mathis Dad higgs ([facility])   dx 08-16-2015--  Stage 1A  invasisive (T1c,N0,M0), ER/PR+, HER2 negative---- 06-26-2015 left partial mastectomy , 07-31-2015 left axilla node dissection x1 (negative)-- completed radiation 10-08-2015 and started arimidex 10-16-2015  . Mixed hyperlipidemia   . Nephrolithiasis    bilateral per CT 04-28-2018  . Type 2 diabetes mellitus ([place])    followed by pcp  . Ureteral calculi    left  . Urgency of urination   . Wears glasses     Past Surgical History:  Procedure Laterality Date  . ABDOMINAL HYSTERECTOMY  2005  . AXILLARY SENTINEL NODE BIOPSY Left 07/31/2015   Procedure: SENTINEL LYMPH NODE BIOPSY, LEFT AXILLA, ;  Surgeon: Aviva Signs, MD;  [facility];  Service: General;  Laterality: Left;  Sentinel Node @ 1000  . BREAST BIOPSY Left 06/09/2016   Procedure: LEFT BREAST BIOPSY AFTER NEEDLE LOCALIZATION;  Surgeon: Aviva Signs, MD;  [facility];  Service: General;  Laterality: Left;  . CARPAL TUNNEL RELEASE Left 11/02/2014   Procedure: CARPAL TUNNEL RELEASE;  Surgeon: Charlotte Crumb, MD;  Location: [place];  Service: Orthopedics;  Laterality: Left;  . CATARACT EXTRACTION W/ INTRAOCULAR LENS  IMPLANT, BILATERAL  2007  . COLONOSCOPY N/A 02/03/2017   Procedure: COLONOSCOPY;  Surgeon: Danie Binder, MD;  [facility];  Service: Endoscopy;  Laterality: N/A;  1245  . COLONOSCOPY N/A 03/01/2020   Procedure: COLONOSCOPY;  Surgeon: Danie Binder, MD;  [facility];  Service: Endoscopy;  Laterality: N/A;  10:30am  . COLONOSCOPY W/ BIOPSIES AND POLYPECTOMY    . CYSTOSCOPY WITH RETROGRADE PYELOGRAM, URETEROSCOPY AND STENT PLACEMENT Left 05/09/2018   Procedure: CYSTOSCOPY WITH RETROGRADE PYELOGRAM, URETEROSCOPY AND STENT PLACEMENT;  Surgeon: Cleon Gustin, MD;  Location: [facility];  Service: Urology;  Laterality: Left;  . CYSTOSCOPY/URETEROSCOPY/HOLMIUM LASER/STENT PLACEMENT Bilateral 03/18/2017   Procedure: CYSTOSCOPY/ BILATERAL RETROGRADE/RIGHT URETEROSCOPY/ RIGHT HOLMIUM  LASER APPLICATION/RIGHT URETERAL STENT PLACEMENT;  Surgeon: Irine Seal, MD;  [facility];  Service: Urology;  Laterality: Bilateral;  . EXTRACORPOREAL SHOCK WAVE LITHOTRIPSY Right 12/14/2019   Procedure: EXTRACORPOREAL SHOCK WAVE LITHOTRIPSY (ESWL);  Surgeon: Festus Aloe, MD;  Location: [facility];  Service: Urology;  Laterality: Right;  . EXTRACORPOREAL SHOCK WAVE LITHOTRIPSY Left 02/01/2020   Procedure: EXTRACORPOREAL SHOCK WAVE LITHOTRIPSY (ESWL);  Surgeon: Alexis Frock, MD;  Location: [facility];  Service: Urology;  Laterality: Left;  . HOLMIUM LASER APPLICATION Left 3/83/3383   Procedure: HOLMIUM LASER APPLICATION;  Surgeon: Cleon Gustin, MD;  Location: [facility];  Service: Urology;  Laterality: Left;  . OPEN REDUCTION INTERNAL FIXATION (ORIF) DISTAL RADIAL FRACTURE Left 11/02/2014   Procedure: OPEN REDUCTION INTERNAL FIXATION (ORIF) DISTAL RADIAL FRACTURE;  Surgeon: Charlotte Crumb, MD;  Location: [place];  Service: Orthopedics;  Laterality: Left;  . PARTIAL MASTECTOMY WITH NEEDLE LOCALIZATION Left 06/26/2015   Procedure: PARTIAL MASTECTOMY WITH NEEDLE LOCALIZATION;  Surgeon: Aviva Signs, MD;  [facility];  Service: General;  Laterality: Left;  Needle Loc @ 8:00am  . POLYPECTOMY  02/03/2017   Procedure: POLYPECTOMY;  Surgeon: Danie Binder, MD;  [facility];  Service: Endoscopy;;  descending and hepatic flexure  . POLYPECTOMY  03/01/2020   Procedure: POLYPECTOMY;  Surgeon: Danie Binder, MD;  [facility];  Service: Endoscopy;;  ascending;splenic flexure polyps x2;rectal  . TARSAL METATARSAL ARTHRODESIS Left 09-07-2007   dr Beola Cord  [facility]   first and second tarsal metatarsal fusion, gastroc slide, neurolysis  . TOTAL ABDOMINAL HYSTERECTOMY W/ BILATERAL SALPINGOOPHORECTOMY  10-21-2004   dr soper  [facility]    Current Outpatient Medications on File Prior to Visit  Medication Sig Dispense Refill  .  acetaminophen (TYLENOL 8 HOUR ARTHRITIS PAIN) 650 MG CR tablet Take 500 mg by mouth every 8 (eight) hours as needed for pain.     Marland Kitchen amLODipine-olmesartan (AZOR) 5-40 MG tablet Take 1 tablet by mouth daily.    Marland Kitchen anastrozole (ARIMIDEX) 1 MG tablet Take 1 tablet by mouth once daily 90 tablet 3  . Blood Glucose Calibration (ACCU-CHEK AVIVA) SOLN  (Patient not taking: Reported on 05/28/2020)    . Blood Glucose Monitoring Suppl (ACCU-CHEK AVIVA PLUS) w/Device KIT     . calcium carbonate (TUMS EX) 750 MG chewable tablet Chew 750 mg by mouth daily.     . Camphor-Menthol-Methyl Sal (SALONPAS) 3.11-21-08 % PTCH Apply 1 patch topically daily as needed (pain).     . Cholecalciferol (VITAMIN D3) 1000 units CAPS Take 1,000 Units by mouth daily.     . Coenzyme Q10 (CO Q 10 PO) Take 1 tablet by mouth daily.    . fenofibrate 54 MG tablet Take 54 mg by mouth daily.     Marland Kitchen glucose blood (ONE TOUCH ULTRA TEST) test strip 1 each 2 (two) times daily. Check blood sugar 1-2 times daily    . glucose blood (PRECISION QID TEST) test strip 1 each 2 (two) times daily.  Check Blood sugar 1-2 times daily    . ibuprofen (ADVIL) 200 MG tablet Take 200 mg by mouth every 6 (six) hours as needed.     . metFORMIN (GLUCOPHAGE) 1000 MG tablet Take 1,000 mg by mouth 2 (two) times daily with a meal.     . Multiple Vitamin (MULTIVITAMIN WITH MINERALS) TABS tablet Take 1 tablet by mouth every morning. Women's One A Day 50+     . Omega-3 Fatty Acids (FISH OIL) 1200 MG CAPS Take 1,000 mg by mouth 2 (two) times daily.     Marland Kitchen omeprazole (PRILOSEC) 20 MG capsule Take 1 capsule (20 mg total) by mouth daily. (Patient taking differently: Take 10 mg by mouth daily. ) 30 capsule 5  . simvastatin (ZOCOR) 40 MG tablet Take 40 mg by mouth at bedtime.      No current facility-administered medications on file prior to visit.    Social History   Socioeconomic History  . Marital status: Married    Spouse name: hubert  . Number of children: 2  . Years of  education: gef  . Highest education level: Not on file  Occupational History  . Occupation: Retired  Tobacco Use  . Smoking status: Former Smoker    Packs/day: 1.00    Years: 20.00    Pack years: 20.00    Types: Cigarettes    Quit date: 06/05/1986    Years since quitting: 34.1  . Smokeless tobacco: Never Used  Vaping Use  . Vaping Use: Never used  Substance and Sexual Activity  . Alcohol use: No  . Drug use: No  . Sexual activity: Never    Birth control/protection: Surgical  Other Topics Concern  . Not on file  Social History Narrative  . Not on file   Social Determinants of Health   Financial Resource Strain:   . Difficulty of Paying Living Expenses: Not on file  Food Insecurity:   . Worried About Charity fundraiser in the Last Year: Not on file  . Ran Out of Food in the Last Year: Not on file  Transportation Needs:   . Lack of Transportation (Medical): Not on file  . Lack of Transportation (Non-Medical): Not on file  Physical Activity:   . Days of Exercise per Week: Not on file  . Minutes of Exercise per Session: Not on file  Stress:   . Feeling of Stress : Not on file  Social Connections:   . Frequency of Communication with Friends and Family: Not on file  . Frequency of Social Gatherings with Friends and Family: Not on file  . Attends Religious Services: Not on file  . Active Member of Clubs or Organizations: Not on file  . Attends Archivist Meetings: Not on file  . Marital Status: Not on file  Intimate Partner Violence:   . Fear of Current or Ex-Partner: Not on file  . Emotionally Abused: Not on file  . Physically Abused: Not on file  . Sexually Abused: Not on file    Family History  Problem Relation Age of Onset  . Hypertension Mother   . Osteoporosis Mother   . Diabetes Sister        x3  . Colon cancer Neg Hx     BP (!) 155/77   Pulse 89   Ht _0  (1.651 m)   Wt 174 lb (78.9 kg)   BMI 28.96 kg/m   Body mass index is 28.96  kg/m.  Objective:   Physical Exam Vitals and nursing note reviewed. Exam conducted with a chaperone present.  Constitutional:      Appearance: She is well-developed.  HENT:     Head: Normocephalic and atraumatic.  Eyes:     Conjunctiva/sclera: Conjunctivae normal.     Pupils: Pupils are equal, round, and reactive to light.  Cardiovascular:     Rate and Rhythm: Normal rate and regular rhythm.  Pulmonary:     Effort: Pulmonary effort is normal.  Abdominal:     Palpations: Abdomen is soft.  Musculoskeletal:     Cervical back: Normal range of motion and neck supple.       Legs:  Skin:    General: Skin is warm and dry.  Neurological:     Mental Status: She is alert and oriented to person, place, and time.     Cranial Nerves: No cranial nerve deficit.     Motor: No abnormal muscle tone.     Coordination: Coordination normal.     Deep Tendon Reflexes: Reflexes are normal and symmetric. Reflexes normal.  Psychiatric:        Behavior: Behavior normal.        Thought Content: Thought content normal.        Judgment: Judgment normal.    X-rays were done of the right knee, reported separately.       Assessment & Plan:   Encounter Diagnosis  Name Primary?  . Acute pain of right knee Yes   I am concerned about a meniscus tear.  She has effusion and giving way which is getting worse.  I will get a MRI of the knee.    PROCEDURE NOTE:  The patient requests injections of the right knee , verbal consent was obtained.  The right knee was prepped appropriately after time out was performed.   Sterile technique was observed and injection of 1 cc of Depo-Medrol 40 mg with several cc's of plain xylocaine. Anesthesia was provided by ethyl chloride and a 20-gauge needle was used to inject the knee area. The injection was tolerated well.  A band aid dressing was applied.  The patient was advised to apply ice later today and tomorrow to the injection sight as needed.  Return in  two weeks.  I have offered her a brace for the knee.  She will try and select if she would like one.  Call if any problem.  Precautions discussed.   Electronically Signed Sanjuana Kava, MD 9/16/202111:28 AM

## 2020-08-13 ENCOUNTER — Ambulatory Visit (HOSPITAL_COMMUNITY)
Admission: RE | Admit: 2020-08-13 | Discharge: 2020-08-13 | Disposition: A | Discharge status: Home / Self Care | Payer: Medicare HMO | Source: Ambulatory Visit | Attending: Orthopaedic Surgery | Admitting: Orthopaedic Surgery

## 2020-08-13 ENCOUNTER — Other Ambulatory Visit: Payer: Self-pay

## 2020-08-13 DIAGNOSIS — M25561 Pain in right knee: Secondary | ICD-10-CM | POA: Diagnosis not present

## 2020-08-15 ENCOUNTER — Ambulatory Visit: Payer: Medicare HMO | Admitting: Orthopaedic Surgery

## 2020-08-20 ENCOUNTER — Encounter: Payer: Self-pay | Admitting: Orthopaedic Surgery

## 2020-08-20 ENCOUNTER — Other Ambulatory Visit: Payer: Self-pay

## 2020-08-20 ENCOUNTER — Ambulatory Visit (INDEPENDENT_AMBULATORY_CARE_PROVIDER_SITE_OTHER): Payer: Medicare HMO | Admitting: Orthopaedic Surgery

## 2020-08-20 VITALS — BP 144/71 | HR 86 | Ht 65.0 in | Wt 174.0 lb

## 2020-08-20 DIAGNOSIS — M23200 Derangement of unspecified lateral meniscus due to old tear or injury, right knee: Secondary | ICD-10-CM

## 2020-08-20 NOTE — Progress Notes (Signed)
Patient EE:FEOF [name], female DOB:June 23, 1946, 74 y.o. HQR:[id number]  Chief Complaint  Patient presents with  . Knee Pain    right  . Results    review MRI     HPI  [name] is a 74 y.o. female who has right knee pain with swelling and giving way.  She had MRI done which showed: IMPRESSION: Flap tear posterior horn lateral meniscus with a fragment displaced along the periphery of the lateral tibial eminence.  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 Loose body off the superior pole the patella again noted.  Baker's cyst.  I have explained the findings.  I have recommended arthroscopy of the knee.  I will have her see Dr. Aline Brochure  Her husband had recent surgery and she would like to delay having surgery for a while.     Body mass index is 28.96 kg/m.  ROS  Review of Systems  Constitutional: Positive for activity change.  Musculoskeletal: Positive for arthralgias, gait problem and joint swelling.  All other systems reviewed and are negative.   All other systems reviewed and are negative.  The following is a summary of the past history medically, past history surgically, known current medicines, social history and family history.  This information is gathered electronically by the computer from prior information and documentation.  I review this each visit and have found including this information at this point in the chart is beneficial and informative.    Past Medical History:  Diagnosis Date  . Abdominal hernia    per CT 04-28-2018  abdominal/ pelvic anterior wall hernia  . Arthritis   . CKD (chronic kidney disease), stage III ([place])   . Disorder of bone and cartilage, unspecified   . Essential hypertension, benign   . Full dentures   . GERD (gastroesophageal reflux disease)   . 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 History of kidney stones   . History of radiation therapy 09-10-2015  to [phone]   left breast 50Gy  . Iron deficiency anemia   . Malignant neoplasm of lower-inner quadrant of left breast in female, estrogen receptor positive [facility]) oncologist-  dr Mathis Dad higgs ([facility])   dx 08-16-2015--  Stage 1A invasisive (T1c,N0,M0), ER/PR+, HER2 negative---- 06-26-2015 left partial mastectomy , 07-31-2015 left axilla node dissection x1 (negative)-- completed radiation 10-08-2015 and started arimidex 10-16-2015  . Mixed hyperlipidemia   . Nephrolithiasis    bilateral per CT 04-28-2018  . Type 2 diabetes mellitus ([place])    followed by pcp  . Ureteral calculi    left  . Urgency of urination   . Wears glasses     Past Surgical History:  Procedure Laterality Date  . ABDOMINAL HYSTERECTOMY  2005  . AXILLARY SENTINEL NODE BIOPSY Left 07/31/2015   Procedure: SENTINEL LYMPH NODE BIOPSY, LEFT AXILLA, ;  Surgeon: Aviva Signs, MD;  [facility];  Service: General;  Laterality: Left;  Sentinel Node @ 1000  . BREAST BIOPSY Left 06/09/2016   Procedure: LEFT BREAST BIOPSY AFTER NEEDLE LOCALIZATION;  Surgeon: Aviva Signs, MD;  [facility];  Service: General;  Laterality: Left;  . CARPAL TUNNEL RELEASE Left 11/02/2014   Procedure: CARPAL TUNNEL RELEASE;  Surgeon: Charlotte Crumb, MD;  Location: [place];  Service: Orthopedics;  Laterality: Left;  . CATARACT EXTRACTION W/ INTRAOCULAR LENS  IMPLANT, BILATERAL  2007  . COLONOSCOPY N/A 02/03/2017   Procedure: COLONOSCOPY;  Surgeon: Sandi L Fields, MD;  [facility];  Service: Endoscopy;  Laterality: N/A;  1245  . COLONOSCOPY N/A 03/01/2020   Procedure: COLONOSCOPY;  Surgeon: Fields, Sandi L, MD;  [facility];  Service: Endoscopy;  Laterality: N/A;  10:30am  . COLONOSCOPY W/ BIOPSIES AND POLYPECTOMY    . CYSTOSCOPY WITH RETROGRADE PYELOGRAM, URETEROSCOPY AND STENT PLACEMENT Left 05/09/2018   Procedure: CYSTOSCOPY WITH RETROGRADE PYELOGRAM, URETEROSCOPY AND STENT PLACEMENT;  Surgeon: McKenzie, Patrick  L, MD;  Location: [facility];  Service: Urology;  Laterality: Left;  . CYSTOSCOPY/URETEROSCOPY/HOLMIUM LASER/STENT PLACEMENT Bilateral 03/18/2017   Procedure: CYSTOSCOPY/ BILATERAL RETROGRADE/RIGHT URETEROSCOPY/ RIGHT HOLMIUM LASER APPLICATION/RIGHT URETERAL STENT PLACEMENT;  Surgeon: John Wrenn, MD;  [facility];  Service: Urology;  Laterality: Bilateral;  . EXTRACORPOREAL SHOCK WAVE LITHOTRIPSY Right 12/14/2019   Procedure: EXTRACORPOREAL SHOCK WAVE LITHOTRIPSY (ESWL);  Surgeon: Eskridge, Matthew, MD;  Location: [facility];  Service: Urology;  Laterality: Right;  . EXTRACORPOREAL SHOCK WAVE LITHOTRIPSY Left 02/01/2020   Procedure: EXTRACORPOREAL SHOCK WAVE LITHOTRIPSY (ESWL);  Surgeon: Manny, Theodore, MD;  Location: [facility];  Service: Urology;  Laterality: Left;  . HOLMIUM LASER APPLICATION Left 05/09/2018   Procedure: HOLMIUM LASER APPLICATION;  Surgeon: McKenzie, Patrick L, MD;  Location: [facility];  Service: Urology;  Laterality: Left;  . OPEN REDUCTION INTERNAL FIXATION (ORIF) DISTAL RADIAL FRACTURE Left 11/02/2014   Procedure: OPEN REDUCTION INTERNAL FIXATION (ORIF) DISTAL RADIAL FRACTURE;  Surgeon: Matthew Weingold, MD;  Location: [facility];  Service: Orthopedics;  Laterality: Left;  . PARTIAL MASTECTOMY WITH NEEDLE LOCALIZATION Left 06/26/2015   Procedure: PARTIAL MASTECTOMY WITH NEEDLE LOCALIZATION;  Surgeon: Mark Jenkins, MD;  [facility];  Service: General;  Laterality: Left;  Needle Loc @ 8:00am  . POLYPECTOMY  02/03/2017   Procedure: POLYPECTOMY;  Surgeon: Sandi L Fields, MD;  [facility];  Service: Endoscopy;;  descending and hepatic flexure  . POLYPECTOMY  03/01/2020   Procedure: POLYPECTOMY;  Surgeon: Fields, Sandi L, MD;  [facility];  Service: Endoscopy;;  ascending;splenic flexure polyps x2;rectal  . TARSAL METATARSAL ARTHRODESIS Left 09-07-2007   dr bednarz  [facility]   first and second tarsal  metatarsal fusion, gastroc slide, neurolysis  . TOTAL ABDOMINAL HYSTERECTOMY W/ BILATERAL SALPINGOOPHORECTOMY  10-21-2004   dr soper  [facility]    Family History  Problem Relation Age of Onset  . Hypertension Mother   . Osteoporosis Mother   . Diabetes Sister        x3  . Colon cancer Neg Hx     Social History Social History   Tobacco Use  . Smoking status: Former Smoker    Packs/day: 1.00    Years: 20.00    Pack years: 20.00    Types: Cigarettes    Quit date: 06/05/1986    Years since quitting: 34.2  . Smokeless tobacco: Never Used  Vaping Use  . Vaping Use: Never used  Substance Use Topics  . Alcohol use: No  . Drug use: No    No Known Allergies  Current Outpatient Medications  Medication Sig Dispense Refill  . acetaminophen (TYLENOL 8 HOUR ARTHRITIS PAIN) 650 MG CR tablet Take 500 mg by mouth every 8 (eight) hours as needed for pain.     . amLODipine-olmesartan (AZOR) 5-40 MG tablet Take 1 tablet by mouth daily.    . anastrozole (ARIMIDEX) 1 MG tablet Take 1 tablet by mouth once daily 90 tablet 3  . Blood   Glucose Calibration (ACCU-CHEK AVIVA) SOLN     . Blood Glucose Monitoring Suppl (ACCU-CHEK AVIVA PLUS) w/Device KIT     . calcium carbonate (TUMS EX) 750 MG chewable tablet Chew 750 mg by mouth daily.     . Camphor-Menthol-Methyl Sal (SALONPAS) 3.11-21-08 % PTCH Apply 1 patch topically daily as needed (pain).     . Cholecalciferol (VITAMIN D3) 1000 units CAPS Take 1,000 Units by mouth daily.     . Coenzyme Q10 (CO Q 10 PO) Take 1 tablet by mouth daily.    . glucose blood (ONE TOUCH ULTRA TEST) test strip 1 each 2 (two) times daily. Check blood sugar 1-2 times daily    . glucose blood (PRECISION QID TEST) test strip 1 each 2 (two) times daily. Check Blood sugar 1-2 times daily    . ibuprofen (ADVIL) 200 MG tablet Take 200 mg by mouth every 6 (six) hours as needed.     . metFORMIN (GLUCOPHAGE) 1000 MG tablet Take 1,000 mg by mouth 2 (two) times daily with a meal.     .  Multiple Vitamin (MULTIVITAMIN WITH MINERALS) TABS tablet Take 1 tablet by mouth every morning. Women's One A Day 50+     . Omega-3 Fatty Acids (FISH OIL) 1200 MG CAPS Take 1,000 mg by mouth 2 (two) times daily.     . omeprazole (PRILOSEC) 20 MG capsule Take 1 capsule (20 mg total) by mouth daily. (Patient taking differently: Take 10 mg by mouth daily. ) 30 capsule 5  . simvastatin (ZOCOR) 40 MG tablet Take 40 mg by mouth at bedtime.     . fenofibrate 54 MG tablet Take 54 mg by mouth daily.      No current facility-administered medications for this visit.     Physical Exam  Blood pressure (!) 144/71, pulse 86, height 5' 5" (1.651 m), weight 174 lb (78.9 kg).  Constitutional: overall normal hygiene, normal nutrition, well developed, normal grooming, normal body habitus. Assistive device:none  Musculoskeletal: gait and station Limp right, muscle tone and strength are normal, no tremors or atrophy is present.  .  Neurological: coordination overall normal.  Deep tendon reflex/nerve stretch intact.  Sensation normal.  Cranial nerves II-XII intact.   Skin:   Normal overall no scars, lesions, ulcers or rashes. No psoriasis.  Psychiatric: Alert and oriented x 3.  Recent memory intact, remote memory unclear.  Normal mood and affect. Well groomed.  Good eye contact.  Cardiovascular: overall no swelling, no varicosities, no edema bilaterally, normal temperatures of the legs and arms, no clubbing, cyanosis and good capillary refill.  Lymphatic: palpation is normal.  Right knee pain, effusion, crepitus, ROM 0 to 105, limp right, NV intact.  Pain laterally.  Positive [place].  All other systems reviewed and are negative   The patient has been educated about the nature of the problem(s) and counseled on treatment options.  The patient appeared to understand what I have discussed and is in agreement with it.  Encounter Diagnosis  Name Primary?  . Other old tear of lateral meniscus of right knee  Yes    PLAN Call if any problems.  Precautions discussed.  Continue current medications.   Return to clinic to see Dr. Harrison..   Electronically Signed Wayne Keeling, MD 10/5/20212:13 PM   

## 2020-08-23 DIAGNOSIS — M23351 Other meniscus derangements, posterior horn of lateral meniscus, right knee: Secondary | ICD-10-CM | POA: Insufficient documentation

## 2020-09-11 ENCOUNTER — Other Ambulatory Visit: Payer: Self-pay

## 2020-09-11 ENCOUNTER — Ambulatory Visit: Payer: Medicare HMO | Admitting: Orthopedic Surgery

## 2020-09-11 ENCOUNTER — Encounter: Payer: Self-pay | Admitting: Orthopedic Surgery

## 2020-09-11 VITALS — BP 150/71 | HR 104 | Ht 65.0 in | Wt 175.0 lb

## 2020-09-11 DIAGNOSIS — M23200 Derangement of unspecified lateral meniscus due to old tear or injury, right knee: Secondary | ICD-10-CM

## 2020-09-11 DIAGNOSIS — M84469A Pathological fracture, unspecified tibia and fibula, initial encounter for fracture: Secondary | ICD-10-CM | POA: Diagnosis not present

## 2020-09-11 DIAGNOSIS — M7121 Synovial cyst of popliteal space [Baker], right knee: Secondary | ICD-10-CM

## 2020-09-11 DIAGNOSIS — M1710 Unilateral primary osteoarthritis, unspecified knee: Secondary | ICD-10-CM

## 2020-09-11 NOTE — Progress Notes (Addendum)
Preop in-house consultation referral  Chief Complaint  Patient presents with   Knee Pain    right/ discuss surgery has torn meniscus    Encounter Diagnoses  Name Primary?   Other old tear of lateral meniscus of right knee Yes   Primary localized osteoarthritis of knee    Baker's cyst of knee, right    Insufficiency fracture of tibia, initial encounter     74 year old female had some knee problems several years back had an MRI and it was felt that she did need surgery however about 2 months ago she was getting out of her car twisted her knee and has had pain ever since  Her preop treatment included Tylenol Voltaren gel injection and bracing she did not improve she complains of lateral and posterior lateral knee pain and giving way  MRI was obtained which indicated patient may need surgery  Review of systems no chest pain or shortness of breath she has some skin issues some: Issues in the past she has had some kidney disease  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   History of kidney stones    History of radiation therapy 09-10-2015 to [phone]   left breast 50Gy   Iron deficiency anemia    Malignant neoplasm of lower-inner quadrant of left breast in female, estrogen receptor positive [facility]) oncologist-  dr Mathis Dad higgs ([facility])   dx 08-16-2015--  Stage 1A invasisive (T1c,N0,M0), ER/PR+, HER2 negative---- 06-26-2015 left partial mastectomy , 07-31-2015 left axilla node dissection x1 (negative)-- completed radiation 10-08-2015 and started arimidex 10-16-2015   Mixed hyperlipidemia     Nephrolithiasis    bilateral per CT 04-28-2018   Type 2 diabetes mellitus ([place])    followed by pcp   Ureteral calculi    left   Urgency of urination    Wears glasses    Past Surgical History:  Procedure Laterality Date   ABDOMINAL HYSTERECTOMY  2005   AXILLARY SENTINEL NODE BIOPSY Left 07/31/2015   Procedure: SENTINEL LYMPH NODE BIOPSY, LEFT AXILLA, ;  Surgeon: Aviva Signs, MD;  Location: [facility];  Service: General;  Laterality: Left;  Sentinel Node @ 1000   BREAST BIOPSY Left 06/09/2016   Procedure: LEFT BREAST BIOPSY AFTER NEEDLE LOCALIZATION;  Surgeon: Aviva Signs, MD;  Location: [facility];  Service: General;  Laterality: Left;   CARPAL TUNNEL RELEASE Left 11/02/2014   Procedure: CARPAL TUNNEL RELEASE;  Surgeon: Charlotte Crumb, MD;  Location: [place];  Service: Orthopedics;  Laterality: Left;   CATARACT EXTRACTION W/ INTRAOCULAR LENS  IMPLANT, BILATERAL  2007   COLONOSCOPY N/A 02/03/2017   Procedure: COLONOSCOPY;  Surgeon: Danie Binder, MD;  [facility];  Service: Endoscopy;  Laterality: N/A;  1245   COLONOSCOPY N/A 03/01/2020   Procedure: COLONOSCOPY;  Surgeon: Danie Binder, MD;  [facility]: [facility];  Service: Endoscopy;  Laterality: N/A;  10:30am   COLONOSCOPY W/ BIOPSIES AND POLYPECTOMY     CYSTOSCOPY WITH RETROGRADE PYELOGRAM, URETEROSCOPY AND STENT PLACEMENT Left 05/09/2018   Procedure: CYSTOSCOPY WITH RETROGRADE PYELOGRAM, URETEROSCOPY AND  STENT PLACEMENT;  Surgeon: Cleon Gustin, MD;  Location: [facility];  Service: Urology;  Laterality: Left;   CYSTOSCOPY/URETEROSCOPY/HOLMIUM LASER/STENT PLACEMENT Bilateral 03/18/2017   Procedure: CYSTOSCOPY/ BILATERAL RETROGRADE/RIGHT URETEROSCOPY/ RIGHT HOLMIUM LASER APPLICATION/RIGHT URETERAL STENT PLACEMENT;  Surgeon: Irine Seal, MD;  Location: [facility];  Service: Urology;  Laterality: Bilateral;   EXTRACORPOREAL SHOCK WAVE LITHOTRIPSY Right 12/14/2019   Procedure: EXTRACORPOREAL SHOCK WAVE  LITHOTRIPSY (ESWL);  Surgeon: Festus Aloe, MD;  Location: [facility];  Service: Urology;  Laterality: Right;   EXTRACORPOREAL SHOCK WAVE LITHOTRIPSY Left 02/01/2020   Procedure: EXTRACORPOREAL SHOCK WAVE LITHOTRIPSY (ESWL);  Surgeon: Alexis Frock, MD;  Location: [facility];  Service: Urology;  Laterality: Left;   HOLMIUM LASER APPLICATION Left 9/82/6415   Procedure: HOLMIUM LASER APPLICATION;  Surgeon: Cleon Gustin, MD;  Location: [facility];  Service: Urology;  Laterality: Left;   OPEN REDUCTION INTERNAL FIXATION (ORIF) DISTAL RADIAL FRACTURE Left 11/02/2014   Procedure: OPEN REDUCTION INTERNAL FIXATION (ORIF) DISTAL RADIAL FRACTURE;  Surgeon: Charlotte Crumb, MD;  Location: [place];  Service: Orthopedics;  Laterality: Left;   PARTIAL MASTECTOMY WITH NEEDLE LOCALIZATION Left 06/26/2015   Procedure: PARTIAL MASTECTOMY WITH NEEDLE LOCALIZATION;  Surgeon: Aviva Signs, MD;  Location: [facility];  Service: General;  Laterality: Left;  Needle Loc @ 8:00am   POLYPECTOMY  02/03/2017   Procedure: POLYPECTOMY;  Surgeon: Danie Binder, MD;  [facility];  Service: Endoscopy;;  descending and hepatic flexure   POLYPECTOMY  03/01/2020   Procedure: POLYPECTOMY;  Surgeon: Danie Binder, MD;  [facility];  Service: Endoscopy;;  ascending;splenic flexure polyps x2;rectal   TARSAL METATARSAL ARTHRODESIS Left 09-07-2007   dr Beola Cord  [facility]   first and second tarsal metatarsal fusion, gastroc slide, neurolysis   TOTAL ABDOMINAL HYSTERECTOMY W/ BILATERAL SALPINGOOPHORECTOMY  10-21-2004   dr soper  [facility]    BP (!) 150/71    Pulse (!) 104    Ht _0  (1.651 m)    Wt 175 lb (79.4 kg)    BMI 29.12 kg/m   She is awake alert and oriented x3 mood and affect are completely normal she has a normal body habitus with good grooming and hygiene  Peripheral vascular system is normal  She is ambulatory but has a slight antalgic gait her  lateral joint line is tender her lateral tibia is tender her posterior compartment is tender she has decreased range of motion but is stable joint muscle strength and tone are normal to skin is intact with no nodules and no lacerations  Sensation is normal she is oriented x3 mood and affect are normal  Her outside films show an MRI with a subchondral bone fracture lateral tibia lateral meniscal tear popliteal cyst 3 compartment arthrosis lateral worse than medial  The procedure has been fully reviewed with the patient; The risks and benefits of surgery have been discussed and explained and understood. Alternative treatment has also been reviewed, questions were encouraged and answered. The postoperative plan is also been reviewed.  She is given informed consent for arthroscopy of the right knee and lateral meniscectomy  Postoperatively she will need a hinged brace protected weightbearing for 12 weeks to heal the fracture  Low complication risk  She agrees to surgery

## 2020-09-11 NOTE — Addendum Note (Signed)
Addended byCandice Camp on: 09/11/2020 10:39 AM   Modules accepted: Orders, SmartSet

## 2020-09-11 NOTE — Patient Instructions (Signed)
Meniscus Injury, Arthroscopy   Arthroscopy is a surgical procedure that involves the use of a small scope that has a camera and surgical instruments on the end (arthroscope). An arthroscope can be used to repair your meniscus injury.  LET YOUR HEALTH CARE PROVIDER KNOW ABOUT:  Any allergies you have.  All medicines you are taking, including vitamins, herbs, eyedrops, creams, and over-the-counter medicines.  Any recent colds or infections you have had or currently have.  Previous problems you or members of your family have had with the use of anesthetics.  Any blood disorders or blood clotting problems you have.  Previous surgeries you have had.  Medical conditions you have. RISKS AND COMPLICATIONS Generally, this is a safe procedure. However, as with any procedure, problems can occur. Possible problems include:  Damage to nerves or blood vessels.  Excess bleeding.  Blood clots.  Infection. BEFORE THE PROCEDURE  Do not eat or drink for 6-8 hours before the procedure.  Take medicines as directed by your surgeon. Ask your surgeon about changing or stopping your regular medicines.  You may have lab tests the morning of surgery. PROCEDURE  You will be given one of the following:   A medicine that numbs the area (local anesthesia).  A medicine that makes you go to sleep (general anesthesia).  A medicine injected into your spine that numbs your body below the waist (spinal anesthesia). Most often, several small cuts (incisions) are made in the knee. The arthroscope and instruments go into the incisions to repair the damage. The torn portion of the meniscus is removed.   AFTER THE PROCEDURE  You will be taken to the recovery area where your progress will be monitored. When you are awake, stable, and taking fluids without complications, you will be allowed to go home. This is usually the same day. A torn or stretched ligament (ligament sprain) may take 6-8 weeks to heal.   It  takes about the 4-6 WEEKS if your surgeon removed a torn meniscus.  A repaired meniscus may require 6-12 weeks of recovery time.  A torn ligament needing reconstructive surgery may take 6-12 months to heal fully.   This information is not intended to replace advice given to you by your health care provider. Make sure you discuss any questions you have with your health care provider. You have decided to proceed with operative arthroscopy of the knee. You have decided not to continue with nonoperative measures such as but not limited to oral medication, weight loss, activity modification, physical therapy, bracing, or injection.  We will perform operative arthroscopy of the knee. Some of the risks associated with arthroscopic surgery of the knee include but are not limited to Bleeding Infection Swelling Stiffness Blood clot Pain Need for knee replacement surgery    In compliance with recent [place] law in federal regulation regarding opioid use and abuse and addiction, we will taper (stop) opioid medication after 2 weeks.  If you're not comfortable with these risks and would like to continue with nonoperative treatment please let Dr. Jamila Slatten know prior to your surgery. 

## 2020-10-08 ENCOUNTER — Telehealth: Payer: Self-pay | Admitting: Radiology

## 2020-10-08 NOTE — Telephone Encounter (Signed)
Pending: In RN review Tracking [phone] [place]   For surgery scheduled 10/15/20  [id number] meniscectomy/ R knee

## 2020-10-14 NOTE — Patient Instructions (Signed)
[name]  10/14/2020     @PREFPERIOPPHARMACY @   Your procedure is scheduled on  10/18/2020  Report to [facility] at  [facility]  Call this number if you have problems the morning of surgery:  [phone]   Remember:  Do not eat or drink after midnight.                         Take these medicines the morning of surgery with A SIP OF WATER  Azor, armidex, omeprazole.    Do not wear jewelry, make-up or nail polish.  Do not wear lotions, powders, or perfumes. Please wear deodorant and brush your teeth.  Do not shave 48 hours prior to surgery.  Men may shave face and neck.  Do not bring valuables to the hospital.  [facility] is not responsible for any belongings or valuables.  Contacts, dentures or bridgework may not be worn into surgery.  Leave your suitcase in the car.  After surgery it may be brought to your room.  For patients admitted to the hospital, discharge time will be determined by your treatment team.  Patients discharged the day of surgery will not be allowed to drive home.   Name and phone number of your driver:   family Special instructions: DO NOT smoke the morning of your procedure.  Please read over the following fact sheets that you were given. Anesthesia Post-op Instructions and Care and Recovery After Surgery       Arthroscopic Knee Ligament Repair, Care After This sheet gives you information about how to care for yourself after your procedure. Your health care provider may also give you more specific instructions. If you have problems or questions, contact your health care provider. What can I expect after the procedure? After the procedure, it is common to have:  Pain in your knee.  Bruising and swelling on your knee, calf, and ankle for 3-4 days.  Fatigue. Follow these instructions at home: If you have a brace or immobilizer:  Wear the brace or immobilizer as told by your health care provider. Remove it only as told by  your health care provider.  Loosen the splint or immobilizer if your toes tingle, become numb, or turn cold and blue.  Keep the brace or immobilizer clean. Bathing  Do not take baths, swim, or use a hot tub until your health care provider approves. Ask your health care provider if you can take showers.  Keep your bandage (dressing) dry until your health care provider says that it can be removed. Cover it and your brace or immobilizer with a watertight covering when you take a shower. Incision care   Follow instructions from your health care provider about how to take care of your incision. Make sure you: ? Wash your hands with soap and water before you change your bandage (dressing). If soap and water are not available, use hand sanitizer. ? Change your dressing as told by your health care provider. ? Leave stitches (sutures), skin glue, or adhesive strips in place. These skin closures may need to stay in place for 2 weeks or longer. If adhesive strip edges start to loosen and curl up, you may trim the loose edges. Do not remove adhesive strips completely unless your health care provider tells you to do that.  Check your incision area every day for signs of infection. Check for: ? More redness, swelling,  or pain. ? More fluid or blood. ? Warmth. ? Pus or a bad smell. Managing pain, stiffness, and swelling   If directed, put ice on the affected area. ? If you have a removable brace or immobilizer, remove it as told by your health care provider. ? Put ice in a plastic bag. ? Place a towel between your skin and the bag or between your brace or immobilizer and the bag. ? Leave the ice on for 20 minutes, 2-3 times a day.  Move your toes often to avoid stiffness and to lessen swelling.  Raise (elevate) the injured area above the level of your heart while you are sitting or lying down. Driving  Do not drive until your health care provider approves. If you have a brace or immobilizer on  your leg, ask your health care provider when it is safe for you to drive.  Do not drive or use heavy machinery while taking prescription pain medicine. Activity  Rest as directed. Ask your health care provider what activities are safe for you.  Do physical therapy exercises as told by your health care provider. Physical therapy will help you regain strength and motion in your knee.  Follow instructions from your health care provider about: ? When you may start motion exercises. ? When you may start riding a stationary bike and doing other low-impact activities. ? When you may start to jog and do other high-impact activities. Safety  Do not use the injured limb to support your body weight until your health care provider says that you can. Use crutches as told by your health care provider. General instructions  Do not use any products that contain nicotine or tobacco, such as cigarettes and e-cigarettes. These can delay bone healing. If you need help quitting, ask your health care provider.  To prevent or treat constipation while you are taking prescription pain medicine, your health care provider may recommend that you: ? Drink enough fluid to keep your urine clear or pale yellow. ? Take over-the-counter or prescription medicines. ? Eat foods that are high in fiber, such as fresh fruits and vegetables, whole grains, and beans. ? Limit foods that are high in fat and processed sugars, such as fried and sweet foods.  Take over-the-counter and prescription medicines only as told by your health care provider.  Keep all follow-up visits as told by your health care provider. This is important. Contact a health care provider if:  You have more redness, swelling, or pain around an incision.  You have more fluid or blood coming from an incision.  Your incision feels warm to the touch.  You have a fever.  You have pain or swelling in your knee, and it gets worse.  You have pain that does  not get better with medicine. Get help right away if:  You have trouble breathing.  You have pus or a bad smell coming from an incision.  You have numbness and tingling near the knee joint. Summary  After the procedure, it is common to have knee pain with bruising and swelling on your knee, calf, and ankle.  Icing your knee and raising your leg above the level of your heart will help control the pain and the swelling.  Do physical therapy exercises as told by your health care provider. Physical therapy will help you regain strength and motion in your knee. This information is not intended to replace advice given to you by your health care provider. Make sure you discuss  any questions you have with your health care provider. Document Revised: 10/15/2017 Document Reviewed: 10/27/2016 Elsevier Patient Education  2020 [place] Anesthesia, Adult, Care After This sheet gives you information about how to care for yourself after your procedure. Your health care provider may also give you more specific instructions. If you have problems or questions, contact your health care provider. What can I expect after the procedure? After the procedure, the following side effects are common:  Pain or discomfort at the IV site.  Nausea.  Vomiting.  Sore throat.  Trouble concentrating.  Feeling cold or chills.  Weak or tired.  Sleepiness and fatigue.  Soreness and body aches. These side effects can affect parts of the body that were not involved in surgery. Follow these instructions at home:  For at least 24 hours after the procedure:  Have a responsible adult stay with you. It is important to have someone help care for you until you are awake and alert.  Rest as needed.  Do not: ? Participate in activities in which you could fall or become injured. ? Drive. ? Use heavy machinery. ? Drink alcohol. ? Take sleeping pills or medicines that cause drowsiness. ? Make  important decisions or sign legal documents. ? Take care of children on your own. Eating and drinking  Follow any instructions from your health care provider about eating or drinking restrictions.  When you feel hungry, start by eating small amounts of foods that are soft and easy to digest (bland), such as toast. Gradually return to your regular diet.  Drink enough fluid to keep your urine pale yellow.  If you vomit, rehydrate by drinking water, juice, or clear broth. General instructions  If you have sleep apnea, surgery and certain medicines can increase your risk for breathing problems. Follow instructions from your health care provider about wearing your sleep device: ? Anytime you are sleeping, including during daytime naps. ? While taking prescription pain medicines, sleeping medicines, or medicines that make you drowsy.  Return to your normal activities as told by your health care provider. Ask your health care provider what activities are safe for you.  Take over-the-counter and prescription medicines only as told by your health care provider.  If you smoke, do not smoke without supervision.  Keep all follow-up visits as told by your health care provider. This is important. Contact a health care provider if:  You have nausea or vomiting that does not get better with medicine.  You cannot eat or drink without vomiting.  You have pain that does not get better with medicine.  You are unable to pass urine.  You develop a skin rash.  You have a fever.  You have redness around your IV site that gets worse. Get help right away if:  You have difficulty breathing.  You have chest pain.  You have blood in your urine or stool, or you vomit blood. Summary  After the procedure, it is common to have a sore throat or nausea. It is also common to feel tired.  Have a responsible adult stay with you for the first 24 hours after general anesthesia. It is important to have  someone help care for you until you are awake and alert.  When you feel hungry, start by eating small amounts of foods that are soft and easy to digest (bland), such as toast. Gradually return to your regular diet.  Drink enough fluid to keep your urine pale yellow.  Return to your  normal activities as told by your health care provider. Ask your health care provider what activities are safe for you. This information is not intended to replace advice given to you by your health care provider. Make sure you discuss any questions you have with your health care provider. Document Revised: 11/05/2017 Document Reviewed: 06/18/2017 Elsevier Patient Education  [place]. How to Use Chlorhexidine for Bathing Chlorhexidine gluconate (CHG) is a germ-killing (antiseptic) solution that is used to clean the skin. It can get rid of the bacteria that normally live on the skin and can keep them away for about 24 hours. To clean your skin with CHG, you may be given:  A CHG solution to use in the shower or as part of a sponge bath.  A prepackaged cloth that contains CHG. Cleaning your skin with CHG may help lower the risk for infection:  While you are staying in the intensive care unit of the hospital.  If you have a vascular access, such as a central line, to provide short-term or long-term access to your veins.  If you have a catheter to drain urine from your bladder.  If you are on a ventilator. A ventilator is a machine that helps you breathe by moving air in and out of your lungs.  After surgery. What are the risks? Risks of using CHG include:  A skin reaction.  Hearing loss, if CHG gets in your ears.  Eye injury, if CHG gets in your eyes and is not rinsed out.  The CHG product catching fire. Make sure that you avoid smoking and flames after applying CHG to your skin. Do not use CHG:  If you have a chlorhexidine allergy or have previously reacted to chlorhexidine.  On babies  younger than 28 months of age. How to use CHG solution  Use CHG only as told by your health care provider, and follow the instructions on the label.  Use the full amount of CHG as directed. Usually, this is one bottle. During a shower Follow these steps when using CHG solution during a shower (unless your health care provider gives you different instructions): 1. Start the shower. 2. Use your normal soap and shampoo to wash your face and hair. 3. Turn off the shower or move out of the shower stream. 4. Pour the CHG onto a clean washcloth. Do not use any type of brush or rough-edged sponge. 5. Starting at your neck, lather your body down to your toes. Make sure you follow these instructions: ? If you will be having surgery, pay special attention to the part of your body where you will be having surgery. Scrub this area for at least 1 minute. ? Do not use CHG on your head or face. If the solution gets into your ears or eyes, rinse them well with water. ? Avoid your genital area. ? Avoid any areas of skin that have broken skin, cuts, or scrapes. ? Scrub your back and under your arms. Make sure to wash skin folds. 6. Let the lather sit on your skin for 1-2 minutes or as long as told by your health care provider. 7. Thoroughly rinse your entire body in the shower. Make sure that all body creases and crevices are rinsed well. 8. Dry off with a clean towel. Do not put any substances on your body afterward--such as powder, lotion, or perfume--unless you are told to do so by your health care provider. Only use lotions that are recommended by the manufacturer. 9.  Put on clean clothes or pajamas. 10. If it is the night before your surgery, sleep in clean sheets.  During a sponge bath Follow these steps when using CHG solution during a sponge bath (unless your health care provider gives you different instructions): 1. Use your normal soap and shampoo to wash your face and hair. 2. Pour the CHG onto a  clean washcloth. 3. Starting at your neck, lather your body down to your toes. Make sure you follow these instructions: ? If you will be having surgery, pay special attention to the part of your body where you will be having surgery. Scrub this area for at least 1 minute. ? Do not use CHG on your head or face. If the solution gets into your ears or eyes, rinse them well with water. ? Avoid your genital area. ? Avoid any areas of skin that have broken skin, cuts, or scrapes. ? Scrub your back and under your arms. Make sure to wash skin folds. 4. Let the lather sit on your skin for 1-2 minutes or as long as told by your health care provider. 5. Using a different clean, wet washcloth, thoroughly rinse your entire body. Make sure that all body creases and crevices are rinsed well. 6. Dry off with a clean towel. Do not put any substances on your body afterward--such as powder, lotion, or perfume--unless you are told to do so by your health care provider. Only use lotions that are recommended by the manufacturer. 7. Put on clean clothes or pajamas. 8. If it is the night before your surgery, sleep in clean sheets. How to use CHG prepackaged cloths  Only use CHG cloths as told by your health care provider, and follow the instructions on the label.  Use the CHG cloth on clean, dry skin.  Do not use the CHG cloth on your head or face unless your health care provider tells you to.  When washing with the CHG cloth: ? Avoid your genital area. ? Avoid any areas of skin that have broken skin, cuts, or scrapes. Before surgery Follow these steps when using a CHG cloth to clean before surgery (unless your health care provider gives you different instructions): 1. Using the CHG cloth, vigorously scrub the part of your body where you will be having surgery. Scrub using a back-and-forth motion for 3 minutes. The area on your body should be completely wet with CHG when you are done scrubbing. 2. Do not rinse.  Discard the cloth and let the area air-dry. Do not put any substances on the area afterward, such as powder, lotion, or perfume. 3. Put on clean clothes or pajamas. 4. If it is the night before your surgery, sleep in clean sheets.  For general bathing Follow these steps when using CHG cloths for general bathing (unless your health care provider gives you different instructions). 1. Use a separate CHG cloth for each area of your body. Make sure you wash between any folds of skin and between your fingers and toes. Wash your body in the following order, switching to a new cloth after each step: ? The front of your neck, shoulders, and chest. ? Both of your arms, under your arms, and your hands. ? Your stomach and groin area, avoiding the genitals. ? Your right leg and foot. ? Your left leg and foot. ? The back of your neck, your back, and your buttocks. 2. Do not rinse. Discard the cloth and let the area air-dry. Do not put any  substances on your body afterward--such as powder, lotion, or perfume--unless you are told to do so by your health care provider. Only use lotions that are recommended by the manufacturer. 3. Put on clean clothes or pajamas. Contact a health care provider if:  Your skin gets irritated after scrubbing.  You have questions about using your solution or cloth. Get help right away if:  Your eyes become very red or swollen.  Your eyes itch badly.  Your skin itches badly and is red or swollen.  Your hearing changes.  You have trouble seeing.  You have swelling or tingling in your mouth or throat.  You have trouble breathing.  You swallow any chlorhexidine. Summary  Chlorhexidine gluconate (CHG) is a germ-killing (antiseptic) solution that is used to clean the skin. Cleaning your skin with CHG may help to lower your risk for infection.  You may be given CHG to use for bathing. It may be in a bottle or in a prepackaged cloth to use on your skin. Carefully follow  your health care provider's instructions and the instructions on the product label.  Do not use CHG if you have a chlorhexidine allergy.  Contact your health care provider if your skin gets irritated after scrubbing. This information is not intended to replace advice given to you by your health care provider. Make sure you discuss any questions you have with your health care provider. Document Revised: 01/19/2019 Document Reviewed: 09/30/2017 Elsevier Patient Education  [place].

## 2020-10-15 ENCOUNTER — Other Ambulatory Visit: Payer: Self-pay | Admitting: Orthopedic Surgery

## 2020-10-15 DIAGNOSIS — M23200 Derangement of unspecified lateral meniscus due to old tear or injury, right knee: Secondary | ICD-10-CM

## 2020-10-16 ENCOUNTER — Other Ambulatory Visit: Payer: Self-pay

## 2020-10-16 ENCOUNTER — Encounter (HOSPITAL_COMMUNITY)
Admission: RE | Admit: 2020-10-16 | Discharge: 2020-10-16 | Disposition: A | Discharge status: Home / Self Care | Payer: Medicare HMO | Source: Ambulatory Visit | Attending: Orthopedic Surgery | Admitting: Orthopedic Surgery

## 2020-10-16 ENCOUNTER — Other Ambulatory Visit (HOSPITAL_COMMUNITY)
Admission: RE | Admit: 2020-10-16 | Discharge: 2020-10-16 | Disposition: A | Discharge status: Home / Self Care | Payer: Medicare HMO | Source: Ambulatory Visit | Attending: Orthopedic Surgery | Admitting: Orthopedic Surgery

## 2020-10-16 ENCOUNTER — Encounter (HOSPITAL_COMMUNITY): Payer: Self-pay

## 2020-10-16 DIAGNOSIS — Z01818 Encounter for other preprocedural examination: Secondary | ICD-10-CM | POA: Diagnosis not present

## 2020-10-16 DIAGNOSIS — Z01812 Encounter for preprocedural laboratory examination: Secondary | ICD-10-CM | POA: Insufficient documentation

## 2020-10-16 DIAGNOSIS — Z20822 Contact with and (suspected) exposure to covid-19: Secondary | ICD-10-CM | POA: Insufficient documentation

## 2020-10-16 LAB — BASIC METABOLIC PANEL
Anion gap: 9 (ref 5–15)
BUN: 30 mg/dL — ABNORMAL HIGH (ref 8–23)
CO2: 25 mmol/L (ref 22–32)
Calcium: 9.9 mg/dL (ref 8.9–10.3)
Chloride: 106 mmol/L (ref 98–111)
Creatinine, Ser: 1.3 mg/dL — ABNORMAL HIGH (ref 0.44–1.00)
GFR, Estimated: 43 mL/min — ABNORMAL LOW (ref >60)
Glucose, Bld: 115 mg/dL — ABNORMAL HIGH (ref 70–99)
Potassium: 4.3 mmol/L (ref 3.5–5.1)
Sodium: 140 mmol/L (ref 135–145)

## 2020-10-16 LAB — CBC WITH DIFFERENTIAL/PLATELET
Abs Immature Granulocytes: 0.02 10*3/uL (ref 0.00–0.07)
Basophils Absolute: 0 10*3/uL (ref 0.0–0.1)
Basophils Relative: 1 %
Eosinophils Absolute: 0.1 10*3/uL (ref 0.0–0.5)
Eosinophils Relative: 1 %
HCT: 39.3 % (ref 36.0–46.0)
Hemoglobin: 12.1 g/dL (ref 12.0–15.0)
Immature Granulocytes: 0 %
Lymphocytes Relative: 21 %
Lymphs Abs: 1.1 10*3/uL (ref 0.7–4.0)
MCH: 28.5 pg (ref 26.0–34.0)
MCHC: 30.8 g/dL (ref 30.0–36.0)
MCV: 92.5 fL (ref 80.0–100.0)
Monocytes Absolute: 0.5 10*3/uL (ref 0.1–1.0)
Monocytes Relative: 10 %
Neutro Abs: 3.6 10*3/uL (ref 1.7–7.7)
Neutrophils Relative %: 67 %
Platelets: 206 10*3/uL (ref 150–400)
RBC: 4.25 MIL/uL (ref 3.87–5.11)
RDW: 14.5 % (ref 11.5–15.5)
WBC: 5.3 10*3/uL (ref 4.0–10.5)
nRBC: 0 % (ref 0.0–0.2)

## 2020-10-16 LAB — HEMOGLOBIN A1C
Hgb A1c MFr Bld: 6 % — ABNORMAL HIGH (ref 4.8–5.6)
Mean Plasma Glucose: 125.5 mg/dL

## 2020-10-16 LAB — SARS CORONAVIRUS 2 (TAT 6-24 HRS): SARS Coronavirus 2: NEGATIVE

## 2020-10-17 NOTE — H&P (Signed)
Chief Complaint  Patient presents with  . Knee Pain    right/ discuss surgery has torn meniscus        Encounter Diagnoses  Name Primary?  . Other old tear of lateral meniscus of right knee Yes  . Primary localized osteoarthritis of knee   . Baker's cyst of knee, right   . Insufficiency fracture of tibia, initial encounter     74 year old female had some knee problems several years back had an MRI and it was felt that she did need surgery however about 2 months ago she was getting out of her car twisted her knee and has had pain ever since  Her preop treatment included Tylenol Voltaren gel injection and bracing she did not improve she complains of lateral and posterior lateral knee pain and giving way  MRI was obtained which indicated patient may need surgery  Review of systems no chest pain or shortness of breath she has some skin issues some: Issues in the past she has had some kidney disease      Past Medical History:  Diagnosis Date  . Abdominal hernia    per CT 04-28-2018  abdominal/ pelvic anterior wall hernia  . Arthritis   . CKD (chronic kidney disease), stage III ([place])   . Disorder of bone and cartilage, unspecified   . Essential hypertension, benign   . Full dentures   . GERD (gastroesophageal reflux disease)   . 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 History of kidney stones   . History of radiation therapy 09-10-2015 to [id number]   left breast 50Gy  . Iron deficiency anemia   . Malignant neoplasm of lower-inner quadrant of left breast in female, estrogen receptor positive [facility]) oncologist-  dr Mathis Dad higgs ([facility])   dx 08-16-2015--  Stage 1A invasisive (T1c,N0,M0), ER/PR+, HER2 negative---- 06-26-2015 left partial mastectomy , 07-31-2015 left axilla node dissection x1 (negative)-- completed radiation 10-08-2015 and started arimidex 10-16-2015  . Mixed hyperlipidemia   .  Nephrolithiasis    bilateral per CT 04-28-2018  . Type 2 diabetes mellitus ([place])    followed by pcp  . Ureteral calculi    left  . Urgency of urination   . Wears glasses         Past Surgical History:  Procedure Laterality Date  . ABDOMINAL HYSTERECTOMY  2005  . AXILLARY SENTINEL NODE BIOPSY Left 07/31/2015   Procedure: SENTINEL LYMPH NODE BIOPSY, LEFT AXILLA, ;  Surgeon: Aviva Signs, MD;  Location: [facility];  Service: General;  Laterality: Left;  Sentinel Node @ 1000  . BREAST BIOPSY Left 06/09/2016   Procedure: LEFT BREAST BIOPSY AFTER NEEDLE LOCALIZATION;  Surgeon: Aviva Signs, MD;  Location: [facility];  Service: General;  Laterality: Left;  . CARPAL TUNNEL RELEASE Left 11/02/2014   Procedure: CARPAL TUNNEL RELEASE;  Surgeon: Charlotte Crumb, MD;  Location: [place];  Service: Orthopedics;  Laterality: Left;  . CATARACT EXTRACTION W/ INTRAOCULAR LENS  IMPLANT, BILATERAL  2007  . COLONOSCOPY N/A 02/03/2017   Procedure: COLONOSCOPY;  Surgeon: Danie Binder, MD;  [facility];  Service: Endoscopy;  Laterality: N/A;  1245  . COLONOSCOPY N/A 03/01/2020   Procedure: COLONOSCOPY;  Surgeon: Danie Binder, MD;  [facility];  Service: Endoscopy;  Laterality: N/A;  10:30am  . COLONOSCOPY W/ BIOPSIES AND POLYPECTOMY    . CYSTOSCOPY WITH RETROGRADE PYELOGRAM, URETEROSCOPY AND STENT PLACEMENT Left 05/09/2018  Procedure: CYSTOSCOPY WITH RETROGRADE PYELOGRAM, URETEROSCOPY AND STENT PLACEMENT;  Surgeon: Cleon Gustin, MD;  Location: [facility];  Service: Urology;  Laterality: Left;  . CYSTOSCOPY/URETEROSCOPY/HOLMIUM LASER/STENT PLACEMENT Bilateral 03/18/2017   Procedure: CYSTOSCOPY/ BILATERAL RETROGRADE/RIGHT URETEROSCOPY/ RIGHT HOLMIUM LASER APPLICATION/RIGHT URETERAL STENT PLACEMENT;  Surgeon: Irine Seal, MD;  Location: [facility];  Service: Urology;  Laterality: Bilateral;  . EXTRACORPOREAL SHOCK WAVE LITHOTRIPSY Right 12/14/2019   Procedure:  EXTRACORPOREAL SHOCK WAVE LITHOTRIPSY (ESWL);  Surgeon: Festus Aloe, MD;  Location[facility];  Service: Urology;  Laterality: Right;  . EXTRACORPOREAL SHOCK WAVE LITHOTRIPSY Left 02/01/2020   Procedure: EXTRACORPOREAL SHOCK WAVE LITHOTRIPSY (ESWL);  Surgeon: Alexis Frock, MD;  Location: [facility];  Service: Urology;  Laterality: Left;  . HOLMIUM LASER APPLICATION Left 6/43/3295   Procedure: HOLMIUM LASER APPLICATION;  Surgeon: Cleon Gustin, MD;  Location: [facility];  Service: Urology;  Laterality: Left;  . OPEN REDUCTION INTERNAL FIXATION (ORIF) DISTAL RADIAL FRACTURE Left 11/02/2014   Procedure: OPEN REDUCTION INTERNAL FIXATION (ORIF) DISTAL RADIAL FRACTURE;  Surgeon: Charlotte Crumb, MD;  Location: [place];  Service: Orthopedics;  Laterality: Left;  . PARTIAL MASTECTOMY WITH NEEDLE LOCALIZATION Left 06/26/2015   Procedure: PARTIAL MASTECTOMY WITH NEEDLE LOCALIZATION;  Surgeon: Aviva Signs, MD;  Location: [facility];  Service: General;  Laterality: Left;  Needle Loc @ 8:00am  . POLYPECTOMY  02/03/2017   Procedure: POLYPECTOMY;  Surgeon: Danie Binder, MD;  [facility];  Service: Endoscopy;;  descending and hepatic flexure  . POLYPECTOMY  03/01/2020   Procedure: POLYPECTOMY;  Surgeon: Danie Binder, MD;  [facility];  Service: Endoscopy;;  ascending;splenic flexure polyps x2;rectal  . TARSAL METATARSAL ARTHRODESIS Left 09-07-2007   dr Beola Cord  [facility]   first and second tarsal metatarsal fusion, gastroc slide, neurolysis  . TOTAL ABDOMINAL HYSTERECTOMY W/ BILATERAL SALPINGOOPHORECTOMY  10-21-2004   dr soper  [facility]    BP (!) 150/71   Pulse (!) 104   Ht $R'5\' 5"'UH$  (1.651 m)   Wt 175 lb (79.4 kg)   BMI 29.12 kg/m   She is awake alert and oriented x3 mood and affect are completely normal she has a normal body habitus with good grooming and hygiene  Peripheral vascular system is normal  She is  ambulatory but has a slight antalgic gait her lateral joint line is tender her lateral tibia is tender her posterior compartment is tender she has decreased range of motion but is stable joint muscle strength and tone are normal to skin is intact with no nodules and no lacerations  Sensation is normal she is oriented x3 mood and affect are normal  Her outside films show an MRI with a subchondral bone fracture lateral tibia lateral meniscal tear popliteal cyst 3 compartment arthrosis lateral worse than medial  The procedure has been fully reviewed with the patient; The risks and benefits of surgery have been discussed and explained and understood. Alternative treatment has also been reviewed, questions were encouraged and answered. The postoperative plan is also been reviewed.  She is given informed consent for arthroscopy of the right knee and lateral meniscectomy  Postoperatively she will need a hinged brace protected weightbearing for 12 weeks to heal the fracture  Low complication risk  She agrees to surgery

## 2020-10-18 ENCOUNTER — Encounter (HOSPITAL_COMMUNITY)
Admission: RE | Disposition: A | Discharge status: Home / Self Care | Payer: Self-pay | Source: Home / Self Care | Attending: Orthopedic Surgery

## 2020-10-18 ENCOUNTER — Ambulatory Visit (HOSPITAL_COMMUNITY)
Admission: RE | Admit: 2020-10-18 | Discharge: 2020-10-18 | Disposition: A | Discharge status: Home / Self Care | Payer: Medicare HMO | Attending: Orthopedic Surgery | Admitting: Orthopedic Surgery

## 2020-10-18 ENCOUNTER — Ambulatory Visit (HOSPITAL_COMMUNITY): Payer: Medicare HMO | Admitting: Certified Registered Nurse Anesthetist

## 2020-10-18 DIAGNOSIS — X501XXA Overexertion from prolonged static or awkward postures, initial encounter: Secondary | ICD-10-CM | POA: Insufficient documentation

## 2020-10-18 DIAGNOSIS — S82201A Unspecified fracture of shaft of right tibia, initial encounter for closed fracture: Secondary | ICD-10-CM | POA: Diagnosis not present

## 2020-10-18 DIAGNOSIS — M7121 Synovial cyst of popliteal space [Baker], right knee: Secondary | ICD-10-CM | POA: Diagnosis not present

## 2020-10-18 DIAGNOSIS — Z9889 Other specified postprocedural states: Secondary | ICD-10-CM | POA: Insufficient documentation

## 2020-10-18 DIAGNOSIS — Z87891 Personal history of nicotine dependence: Secondary | ICD-10-CM | POA: Insufficient documentation

## 2020-10-18 DIAGNOSIS — S83281A Other tear of lateral meniscus, current injury, right knee, initial encounter: Secondary | ICD-10-CM | POA: Diagnosis not present

## 2020-10-18 DIAGNOSIS — M1711 Unilateral primary osteoarthritis, right knee: Secondary | ICD-10-CM | POA: Insufficient documentation

## 2020-10-18 HISTORY — PX: KNEE ARTHROSCOPY WITH LATERAL MENISECTOMY: SHX6193

## 2020-10-18 LAB — GLUCOSE, CAPILLARY: Glucose-Capillary: 141 mg/dL — ABNORMAL HIGH (ref 70–99)

## 2020-10-18 SURGERY — ARTHROSCOPY, KNEE, WITH LATERAL MENISCECTOMY
Anesthesia: General | Site: Knee | Laterality: Right

## 2020-10-18 MED ORDER — FENTANYL CITRATE (PF) 100 MCG/2ML IJ SOLN
INTRAMUSCULAR | Status: DC | PRN
  Administered 2020-10-18: 50 ug via INTRAVENOUS
  Administered 2020-10-18 (×2): 25 ug via INTRAVENOUS

## 2020-10-18 MED ORDER — SODIUM CHLORIDE 0.9 % IR SOLN
Status: DC | PRN
  Administered 2020-10-18: 6000 mL
  Administered 2020-10-18: 3000 mL

## 2020-10-18 MED ORDER — ONDANSETRON HCL 4 MG/2ML IJ SOLN
INTRAMUSCULAR | Status: AC
  Filled 2020-10-18: qty 2

## 2020-10-18 MED ORDER — CHLORHEXIDINE GLUCONATE 0.12 % MT SOLN
15.0000 mL | Freq: Once | OROMUCOSAL | Status: AC
  Administered 2020-10-18: 15 mL via OROMUCOSAL

## 2020-10-18 MED ORDER — PROPOFOL 10 MG/ML IV BOLUS
INTRAVENOUS | Status: DC | PRN
  Administered 2020-10-18: 140 mg via INTRAVENOUS

## 2020-10-18 MED ORDER — LIDOCAINE 2% (20 MG/ML) 5 ML SYRINGE
INTRAMUSCULAR | Status: DC | PRN
  Administered 2020-10-18: 50 mg via INTRAVENOUS

## 2020-10-18 MED ORDER — ACETAMINOPHEN 500 MG PO TABS
500.0000 mg | ORAL_TABLET | Freq: Once | ORAL | Status: AC
  Administered 2020-10-18: 500 mg via ORAL
  Filled 2020-10-18: qty 1

## 2020-10-18 MED ORDER — CEFAZOLIN SODIUM-DEXTROSE 2-4 GM/100ML-% IV SOLN
2.0000 g | INTRAVENOUS | Status: AC
  Administered 2020-10-18: 2 g via INTRAVENOUS

## 2020-10-18 MED ORDER — IBUPROFEN 400 MG PO TABS: 400.0000 mg | ORAL_TABLET | Freq: Three times a day (TID) | ORAL | 0 refills | Status: DC | PRN

## 2020-10-18 MED ORDER — BUPIVACAINE-EPINEPHRINE (PF) 0.5% -1:200000 IJ SOLN
INTRAMUSCULAR | Status: DC | PRN
  Administered 2020-10-18: 30 mL via PERINEURAL

## 2020-10-18 MED ORDER — LIDOCAINE HCL (PF) 2 % IJ SOLN
INTRAMUSCULAR | Status: AC
  Filled 2020-10-18: qty 20

## 2020-10-18 MED ORDER — BUPIVACAINE-EPINEPHRINE (PF) 0.5% -1:200000 IJ SOLN
INTRAMUSCULAR | Status: AC
  Filled 2020-10-18: qty 60

## 2020-10-18 MED ORDER — FENTANYL CITRATE (PF) 100 MCG/2ML IJ SOLN: 25.0000 ug | INTRAMUSCULAR | Status: DC | PRN

## 2020-10-18 MED ORDER — PROPOFOL 10 MG/ML IV BOLUS
INTRAVENOUS | Status: AC
  Filled 2020-10-18: qty 60

## 2020-10-18 MED ORDER — ORAL CARE MOUTH RINSE: 15.0000 mL | Freq: Once | OROMUCOSAL | Status: AC

## 2020-10-18 MED ORDER — LACTATED RINGERS IV SOLN: INTRAVENOUS | Status: DC

## 2020-10-18 MED ORDER — FENTANYL CITRATE (PF) 100 MCG/2ML IJ SOLN
INTRAMUSCULAR | Status: AC
  Filled 2020-10-18: qty 2

## 2020-10-18 MED ORDER — ONDANSETRON HCL 4 MG/2ML IJ SOLN
4.0000 mg | Freq: Once | INTRAMUSCULAR | Status: AC
  Administered 2020-10-18: 4 mg via INTRAVENOUS
  Filled 2020-10-18: qty 2

## 2020-10-18 MED ORDER — EPINEPHRINE PF 1 MG/ML IJ SOLN
INTRAMUSCULAR | Status: AC
  Filled 2020-10-18: qty 5

## 2020-10-18 MED ORDER — TRAMADOL HCL 50 MG PO TABS
50.0000 mg | ORAL_TABLET | Freq: Four times a day (QID) | ORAL | 0 refills | Status: AC | PRN
Start: 2020-10-18 — End: 2020-10-25

## 2020-10-18 MED ORDER — ONDANSETRON HCL 4 MG/2ML IJ SOLN
INTRAMUSCULAR | Status: DC | PRN
  Administered 2020-10-18: 4 mg via INTRAVENOUS

## 2020-10-18 MED ORDER — PHENYLEPHRINE 40 MCG/ML (10ML) SYRINGE FOR IV PUSH (FOR BLOOD PRESSURE SUPPORT)
PREFILLED_SYRINGE | INTRAVENOUS | Status: DC | PRN
  Administered 2020-10-18: 40 ug via INTRAVENOUS
  Administered 2020-10-18 (×2): 80 ug via INTRAVENOUS
  Administered 2020-10-18 (×3): 40 ug via INTRAVENOUS

## 2020-10-18 SURGICAL SUPPLY — 48 items
ABLATOR ASPIRATE 50D MULTI-PRT (SURGICAL WAND) ×2 IMPLANT
APL PRP STRL LF DISP 70% ISPRP (MISCELLANEOUS) ×2
BLADE SHAVER TORPEDO 4X13 (MISCELLANEOUS) ×2 IMPLANT
BLADE SURG SZ11 CARB STEEL (BLADE) ×3 IMPLANT
BNDG CMPR STD VLCR NS LF 5.8X6 (GAUZE/BANDAGES/DRESSINGS) ×1
BNDG ELASTIC 6X5.8 VLCR NS LF (GAUZE/BANDAGES/DRESSINGS) ×3 IMPLANT
CHLORAPREP W/TINT 26 (MISCELLANEOUS) ×6 IMPLANT
CLOTH BEACON ORANGE TIMEOUT ST (SAFETY) ×3 IMPLANT
COOLER ICEMAN CLASSIC (MISCELLANEOUS) ×2 IMPLANT
COVER WAND RF STERILE (DRAPES) ×3 IMPLANT
CUFF TOURN SGL QUICK 34 (TOURNIQUET CUFF) ×3
CUFF TRNQT CYL 34X4.125X (TOURNIQUET CUFF) IMPLANT
DECANTER SPIKE VIAL GLASS SM (MISCELLANEOUS) ×6 IMPLANT
DRAPE HALF SHEET 40X57 (DRAPES) ×3 IMPLANT
GAUZE 4X4 16PLY RFD (DISPOSABLE) ×3 IMPLANT
GAUZE SPONGE 4X4 12PLY STRL (GAUZE/BANDAGES/DRESSINGS) ×3 IMPLANT
GAUZE XEROFORM 5X9 LF (GAUZE/BANDAGES/DRESSINGS) ×3 IMPLANT
GLOVE BIOGEL PI IND STRL 7.0 (GLOVE) ×1 IMPLANT
GLOVE BIOGEL PI INDICATOR 7.0 (GLOVE) ×2
GLOVE SKINSENSE NS SZ8.0 LF (GLOVE) ×2
GLOVE SKINSENSE STRL SZ8.0 LF (GLOVE) ×1 IMPLANT
GLOVE SS N UNI LF 8.5 STRL (GLOVE) ×3 IMPLANT
GOWN STRL REUS W/TWL LRG LVL3 (GOWN DISPOSABLE) ×3 IMPLANT
GOWN STRL REUS W/TWL XL LVL3 (GOWN DISPOSABLE) ×3 IMPLANT
IV NS IRRIG 3000ML ARTHROMATIC (IV SOLUTION) ×8 IMPLANT
KIT BLADEGUARD II DBL (SET/KITS/TRAYS/PACK) ×3 IMPLANT
KIT TURNOVER CYSTO (KITS) ×3 IMPLANT
MANIFOLD NEPTUNE II (INSTRUMENTS) ×3 IMPLANT
MARKER SKIN DUAL TIP RULER LAB (MISCELLANEOUS) ×3 IMPLANT
NDL HYPO 18GX1.5 BLUNT FILL (NEEDLE) ×1 IMPLANT
NDL HYPO 21X1.5 SAFETY (NEEDLE) ×1 IMPLANT
NDL SPNL 18GX3.5 QUINCKE PK (NEEDLE) ×1 IMPLANT
NEEDLE HYPO 18GX1.5 BLUNT FILL (NEEDLE) ×3 IMPLANT
NEEDLE HYPO 21X1.5 SAFETY (NEEDLE) ×3 IMPLANT
NEEDLE SPNL 18GX3.5 QUINCKE PK (NEEDLE) ×3 IMPLANT
NS IRRIG 1000ML POUR BTL (IV SOLUTION) ×3 IMPLANT
PACK ARTHRO LIMB DRAPE STRL (MISCELLANEOUS) ×3 IMPLANT
PAD ABD 5X9 TENDERSORB (GAUZE/BANDAGES/DRESSINGS) ×3 IMPLANT
PAD ARMBOARD 7.5X6 YLW CONV (MISCELLANEOUS) ×3 IMPLANT
PAD COLD SHLDR WRAP-ON (PAD) ×2 IMPLANT
PADDING CAST COTTON 6X4 STRL (CAST SUPPLIES) ×3 IMPLANT
SET ARTHROSCOPY INST (INSTRUMENTS) ×3 IMPLANT
SET BASIN LINEN APH (SET/KITS/TRAYS/PACK) ×3 IMPLANT
SUT ETHILON 3 0 FSL (SUTURE) ×3 IMPLANT
SYR 30ML LL (SYRINGE) ×3 IMPLANT
TUBE CONNECTING 12'X1/4 (SUCTIONS) ×2
TUBE CONNECTING 12X1/4 (SUCTIONS) ×4 IMPLANT
TUBING IN/OUT FLOW W/MAIN PUMP (TUBING) ×3 IMPLANT

## 2020-10-18 NOTE — Interval H&P Note (Signed)
History and Physical Interval Note:  10/18/2020 7:24 AM  [name]  has presented today for surgery, with the diagnosis of right knee lateral meniscus tear.  The various methods of treatment have been discussed with the patient and family. After consideration of risks, benefits and other options for treatment, the patient has consented to  Procedure(s): KNEE ARTHROSCOPY WITH LATERAL MENISECTOMY (Right) as a surgical intervention.  The patient's history has been reviewed, patient examined, no change in status, stable for surgery.  I have reviewed the patient's chart and labs.  Questions were answered to the patient's satisfaction.     Arther Abbott

## 2020-10-18 NOTE — Anesthesia Preprocedure Evaluation (Signed)
Anesthesia Evaluation  Patient identified by MRN, date of birth, ID band Patient awake    Reviewed: Allergy & Precautions, H&P , NPO status , Patient's Chart, lab work & pertinent test results, reviewed documented beta blocker date and time   Airway Mallampati: I  TM Distance: >3 FB Neck ROM: full    Dental no notable dental hx.    Pulmonary neg pulmonary ROS, former smoker,    Pulmonary exam normal breath sounds clear to auscultation       Cardiovascular Exercise Tolerance: Good hypertension, negative cardio ROS   Rhythm:regular Rate:Normal     Neuro/Psych negative neurological ROS  negative psych ROS   GI/Hepatic Neg liver ROS, GERD  Medicated,  Endo/Other  negative endocrine ROSdiabetes  Renal/GU CRFRenal disease  negative genitourinary   Musculoskeletal   Abdominal   Peds  Hematology  (+) Blood dyscrasia, anemia ,   Anesthesia Other Findings   Reproductive/Obstetrics negative OB ROS                             Anesthesia Physical Anesthesia Plan  ASA: III  Anesthesia Plan: General   Post-op Pain Management:    Induction:   PONV Risk Score and Plan: Ondansetron  Airway Management Planned:   Additional Equipment:   Intra-op Plan:   Post-operative Plan:   Informed Consent: I have reviewed the patients History and Physical, chart, labs and discussed the procedure including the risks, benefits and alternatives for the proposed anesthesia with the patient or authorized representative who has indicated his/her understanding and acceptance.     Dental Advisory Given  Plan Discussed with: CRNA  Anesthesia Plan Comments:         Anesthesia Quick Evaluation

## 2020-10-18 NOTE — Interval H&P Note (Signed)
History and Physical Interval Note:  10/18/2020 7:23 AM  [name]  has presented today for surgery, with the diagnosis of right knee lateral meniscus tear.  The various methods of treatment have been discussed with the patient and family. After consideration of risks, benefits and other options for treatment, the patient has consented to  Procedure(s): KNEE ARTHROSCOPY WITH LATERAL MENISECTOMY (Right) as a surgical intervention.  The patient's history has been reviewed, patient examined, no change in status, stable for surgery.  I have reviewed the patient's chart and labs.  Questions were answered to the patient's satisfaction.     Arther Abbott

## 2020-10-18 NOTE — Anesthesia Procedure Notes (Signed)
Procedure Name: LMA Insertion Date/Time: 10/18/2020 7:40 AM Performed by: Hewitt Blade, CRNA Pre-anesthesia Checklist: Patient identified, Emergency Drugs available, Suction available and Patient being monitored Patient Re-evaluated:Patient Re-evaluated prior to induction Oxygen Delivery Method: Circle system utilized Preoxygenation: Pre-oxygenation with 100% oxygen Induction Type: IV induction Ventilation: Mask ventilation without difficulty LMA: LMA inserted LMA Size: 3.0 Number of attempts: 1 Placement Confirmation: positive ETCO2 and breath sounds checked- equal and bilateral Tube secured with: Tape Dental Injury: Teeth and Oropharynx as per pre-operative assessment

## 2020-10-18 NOTE — Op Note (Signed)
10/18/2020  8:55 AM  PATIENT:  [name]  74 y.o. female  PRE-OPERATIVE DIAGNOSIS:  right knee lateral meniscus tear  POST-OPERATIVE DIAGNOSIS:  right knee lateral meniscusectomy  PROCEDURE:  Procedure(s): KNEE ARTHROSCOPY WITH LATERAL MENISECTOMY (Right)  SURGEON:  Surgeon(s) and Role:    Carole Civil, MD - Primary  Findings:  Lateral: torn lateral meniscus posterior horn raial tear               Grade 4 tibial lesion dime size, grade 2 lesion diffuse femur   Notch: nomral acl and pcl   Medial: normal   PTF: grade 2 diffuse trochlea lesion   Ms. Shipman was seen in the preop area and the patient was reexamined and found to be stable for surgery.  Chart review was complete, surgical site was confirmed and marked.  The patient was taken to the operating for general anesthesia with an LMA.  Right leg was placed in an arthroscopic leg holder and then prepped and draped sterilely  Timeout was completed after sterile prep and drape.  A lateral portal was established with 11 blade the scope was placed into the joint and diagnostic arthroscopy was performed.  The main finding was the torn lateral meniscus at the posterior horn there was a radial tear.  Medial portal was then established and the scope was switched over to the medial side to allow better access to the lateral meniscal tear which was resected with a combination of a shaver and a 50 degree ArthroCare wand  Once a stable rim was established and confirmed by probing the joint was irrigated and suctioned dry and injected with 60 cc of Marcaine with epinephrine  Sutures were placed in each portal, sterile dressing was applied and Cryo/Cuff was applied and activated  The patient was then extubated taken to recovery room in stable condition  PHYSICIAN ASSISTANT:   ASSISTANTS: none   ANESTHESIA:   general  EBL:  min   BLOOD ADMINISTERED:none  DRAINS: none   LOCAL MEDICATIONS USED:  MARCAINE      SPECIMEN:  No Specimen  DISPOSITION OF SPECIMEN:  N/A  COUNTS:  YES  TOURNIQUET:  * Missing tourniquet times found for documented tourniquets in log: [id number] *  DICTATION: .Viviann Spare Dictation  PLAN OF CARE: Discharge to home after PACU  PATIENT DISPOSITION:  PACU - hemodynamically stable.   Delay start of Pharmacological VTE agent (>24hrs) due to surgical blood loss or risk of bleeding: not applicable

## 2020-10-18 NOTE — Brief Op Note (Signed)
10/18/2020  8:55 AM  PATIENT:  [name]  74 y.o. female  PRE-OPERATIVE DIAGNOSIS:  right knee lateral meniscus tear  POST-OPERATIVE DIAGNOSIS:  right knee lateral meniscusectomy  PROCEDURE:  Procedure(s): KNEE ARTHROSCOPY WITH LATERAL MENISECTOMY (Right)  SURGEON:  Surgeon(s) and Role:    Carole Civil, MD - Primary  Findings:  Lateral: torn lateral meniscus posterior horn raial tear               Grade 4 tibial lesion dime size, grade 2 lesion diffuse femur   Notch: nomral acl and pcl   Medial: normal   PTF: grade 2 diffuse trochlea lesion   [name] was seen in the preop area and the patient was reexamined and found to be stable for surgery.  Chart review was complete, surgical site was confirmed and marked.  The patient was taken to the operating for general anesthesia with an LMA.  Right leg was placed in an arthroscopic leg holder and then prepped and draped sterilely  Timeout was completed after sterile prep and drape.  A lateral portal was established with 11 blade the scope was placed into the joint and diagnostic arthroscopy was performed.  The main finding was the torn lateral meniscus at the posterior horn there was a radial tear.  Medial portal was then established and the scope was switched over to the medial side to allow better access to the lateral meniscal tear which was resected with a combination of a shaver and a 50 degree ArthroCare wand  Once a stable rim was established and confirmed by probing the joint was irrigated and suctioned dry and injected with 60 cc of Marcaine with epinephrine  Sutures were placed in each portal, sterile dressing was applied and Cryo/Cuff was applied and activated  The patient was then extubated taken to recovery room in stable condition  PHYSICIAN ASSISTANT:   ASSISTANTS: none   ANESTHESIA:   general  EBL:  min   BLOOD ADMINISTERED:none  DRAINS: none   LOCAL MEDICATIONS USED:  MARCAINE      SPECIMEN:  No Specimen  DISPOSITION OF SPECIMEN:  N/A  COUNTS:  YES  TOURNIQUET:  * Missing tourniquet times found for documented tourniquets in log: [id number] *  DICTATION: .Viviann Spare Dictation  PLAN OF CARE: Discharge to home after PACU  PATIENT DISPOSITION:  PACU - hemodynamically stable.   Delay start of Pharmacological VTE agent (>24hrs) due to surgical blood loss or risk of bleeding: not applicable

## 2020-10-18 NOTE — Discharge Instructions (Signed)
General Anesthesia, Adult, Care After This sheet gives you information about how to care for yourself after your procedure. Your health care provider may also give you more specific instructions. If you have problems or questions, contact your health care provider. What can I expect after the procedure? After the procedure, the following side effects are common:  Pain or discomfort at the IV site.  Nausea.  Vomiting.  Sore throat.  Trouble concentrating.  Feeling cold or chills.  Weak or tired.  Sleepiness and fatigue.  Soreness and body aches. These side effects can affect parts of the body that were not involved in surgery. Follow these instructions at home:  For at least 24 hours after the procedure:  Have a responsible adult stay with you. It is important to have someone help care for you until you are awake and alert.  Rest as needed.  Do not: ? Participate in activities in which you could fall or become injured. ? Drive. ? Use heavy machinery. ? Drink alcohol. ? Take sleeping pills or medicines that cause drowsiness. ? Make important decisions or sign legal documents. ? Take care of children on your own. Eating and drinking  Follow any instructions from your health care provider about eating or drinking restrictions.  When you feel hungry, start by eating small amounts of foods that are soft and easy to digest (bland), such as toast. Gradually return to your regular diet.  Drink enough fluid to keep your urine pale yellow.  If you vomit, rehydrate by drinking water, juice, or clear broth. General instructions  If you have sleep apnea, surgery and certain medicines can increase your risk for breathing problems. Follow instructions from your health care provider about wearing your sleep device: ? Anytime you are sleeping, including during daytime naps. ? While taking prescription pain medicines, sleeping medicines, or medicines that make you drowsy.  Return to  your normal activities as told by your health care provider. Ask your health care provider what activities are safe for you.  Take over-the-counter and prescription medicines only as told by your health care provider.  If you smoke, do not smoke without supervision.  Keep all follow-up visits as told by your health care provider. This is important. Contact a health care provider if:  You have nausea or vomiting that does not get better with medicine.  You cannot eat or drink without vomiting.  You have pain that does not get better with medicine.  You are unable to pass urine.  You develop a skin rash.  You have a fever.  You have redness around your IV site that gets worse. Get help right away if:  You have difficulty breathing.  You have chest pain.  You have blood in your urine or stool, or you vomit blood. Summary  After the procedure, it is common to have a sore throat or nausea. It is also common to feel tired.  Have a responsible adult stay with you for the first 24 hours after general anesthesia. It is important to have someone help care for you until you are awake and alert.  When you feel hungry, start by eating small amounts of foods that are soft and easy to digest (bland), such as toast. Gradually return to your regular diet.  Drink enough fluid to keep your urine pale yellow.  Return to your normal activities as told by your health care provider. Ask your health care provider what activities are safe for you. This information is not   intended to replace advice given to you by your health care provider. Make sure you discuss any questions you have with your health care provider. Document Revised: 11/05/2017 Document Reviewed: 06/18/2017 Elsevier Patient Education  2020 [employer]  

## 2020-10-18 NOTE — Transfer of Care (Signed)
Immediate Anesthesia Transfer of Care Note  Patient: [name]  Procedure(s) Performed: KNEE ARTHROSCOPY WITH LATERAL MENISECTOMY (Right Knee)  Patient Location: PACU  Anesthesia Type:General  Level of Consciousness: awake, alert  and oriented  Airway & Oxygen Therapy: Patient Spontanous Breathing and Patient connected to face mask oxygen  Post-op Assessment: Report given to RN, Post -op Vital signs reviewed and stable and Patient moving all extremities  Post vital signs: Reviewed and stable  Last Vitals:  Vitals Value Taken Time  BP 145/64 10/18/20 0907  Temp 36.8 C 10/18/20 0907  Pulse 94 10/18/20 0913  Resp 17 10/18/20 0913  SpO2 90 % 10/18/20 0913  Vitals shown include unvalidated device data.  Last Pain:  Vitals:   10/18/20 0907  TempSrc:   PainSc: (P) 0-No pain      Patients Stated Pain Goal: (P) 9 (89/48/34 7583)  Complications: No complications documented.

## 2020-10-18 NOTE — Anesthesia Postprocedure Evaluation (Signed)
Anesthesia Post Note  Patient: [name]  Procedure(s) Performed: KNEE ARTHROSCOPY WITH LATERAL MENISECTOMY (Right Knee)  Patient location during evaluation: PACU Anesthesia Type: General Level of consciousness: awake Pain management: pain level controlled Vital Signs Assessment: post-procedure vital signs reviewed and stable Respiratory status: spontaneous breathing Cardiovascular status: stable Postop Assessment: no apparent nausea or vomiting Anesthetic complications: no   No complications documented.   Last Vitals:  Vitals:   10/18/20 0639 10/18/20 0907  BP: 138/67 (!) 145/64  Pulse: 90 95  Resp: 20 19  Temp: 36.7 C 36.8 C  SpO2: 95% 98%    Last Pain:  Vitals:   10/18/20 0907  TempSrc:   PainSc: (P) 0-No pain                 Jorita Bohanon Hristova

## 2020-10-21 ENCOUNTER — Encounter (HOSPITAL_COMMUNITY): Payer: Self-pay | Admitting: Orthopedic Surgery

## 2020-10-23 ENCOUNTER — Other Ambulatory Visit: Payer: Self-pay

## 2020-10-23 ENCOUNTER — Encounter: Payer: Self-pay | Admitting: Orthopedic Surgery

## 2020-10-23 ENCOUNTER — Ambulatory Visit (INDEPENDENT_AMBULATORY_CARE_PROVIDER_SITE_OTHER): Payer: Medicare HMO | Admitting: Orthopedic Surgery

## 2020-10-23 DIAGNOSIS — Z9889 Other specified postprocedural states: Secondary | ICD-10-CM

## 2020-10-23 NOTE — Progress Notes (Signed)
GLOBAL PERIOD POST OP APPT   The global period: October 18, 2020-January 16, 2021  INJURY OR POST OP DAY: 5   Encounter Diagnosis  Name Primary?  . S/P right knee arthroscopy 10/18/20 Yes    Chief Complaint  Patient presents with  . Post-op Follow-up    right knee arthroscopy 10/18/20 sutures removed / improving     HPI:  PRE-OPERATIVE DIAGNOSIS:  right knee lateral meniscus tear   POST-OPERATIVE DIAGNOSIS:  right knee lateral meniscusectomy   PROCEDURE:  Procedure(s): KNEE ARTHROSCOPY WITH LATERAL MENISECTOMY (Right)   SURGEON:  Surgeon(s) and Role:    Carole Civil, MD - Primary   Findings:  Lateral: torn lateral meniscus posterior horn raial tear               Grade 4 tibial lesion dime size, grade 2 lesion diffuse femur    Notch: nomral acl and pcl    Medial: normal    PTF: grade 2 diffuse trochlea lesion    Pain medication:   Current Outpatient Medications:  .  acetaminophen (TYLENOL) 500 MG tablet, Take 500 mg by mouth every 6 (six) hours as needed for moderate pain., Disp: , Rfl:  .  amLODipine-olmesartan (AZOR) 5-40 MG tablet, Take 1 tablet by mouth daily., Disp: , Rfl:  .  anastrozole (ARIMIDEX) 1 MG tablet, Take 1 tablet by mouth once daily (Patient taking differently: Take 1 mg by mouth daily. ), Disp: 90 tablet, Rfl: 3 .  Blood Glucose Calibration (ACCU-CHEK AVIVA) SOLN, , Disp: , Rfl:  .  Blood Glucose Monitoring Suppl (ACCU-CHEK AVIVA PLUS) w/Device KIT, , Disp: , Rfl:  .  calcium carbonate (TUMS EX) 750 MG chewable tablet, Chew 750 mg by mouth daily. , Disp: , Rfl:  .  Cholecalciferol (VITAMIN D3) 1000 units CAPS, Take 1,000 Units by mouth daily. , Disp: , Rfl:  .  diclofenac Sodium (VOLTAREN) 1 % GEL, Apply 1 application topically 4 (four) times daily as needed (knee pain)., Disp: , Rfl:  .  glucose blood (ONE TOUCH ULTRA TEST) test strip, 1 each 2 (two) times daily. Check blood sugar 1-2 times daily, Disp: , Rfl:  .  glucose blood (PRECISION  QID TEST) test strip, 1 each 2 (two) times daily. Check Blood sugar 1-2 times daily, Disp: , Rfl:  .  ibuprofen (ADVIL) 400 MG tablet, Take 1 tablet (400 mg total) by mouth every 8 (eight) hours as needed., Disp: 90 tablet, Rfl: 0 .  metFORMIN (GLUCOPHAGE) 1000 MG tablet, Take 1,000 mg by mouth 2 (two) times daily with a meal. , Disp: , Rfl:  .  Multiple Vitamin (MULTIVITAMIN WITH MINERALS) TABS tablet, Take 1 tablet by mouth every morning. Women's One A Day 50+ , Disp: , Rfl:  .  Omega-3 Fatty Acids (FISH OIL) 1000 MG CAPS, Take 1,000 mg by mouth daily., Disp: , Rfl:  .  omeprazole (PRILOSEC) 10 MG capsule, Take 10 mg by mouth daily., Disp: , Rfl:  .  simvastatin (ZOCOR) 40 MG tablet, Take 40 mg by mouth at bedtime. , Disp: , Rfl:  .  traMADol (ULTRAM) 50 MG tablet, Take 1 tablet (50 mg total) by mouth every 6 (six) hours as needed for up to 7 days., Disp: 28 tablet, Rfl: 0 .  fenofibrate 54 MG tablet, Take 54 mg by mouth daily. , Disp: , Rfl:    Pain level: 0-1/10  Physical Exam Portals look clean sutures were removed small knee effusion patient has good flexion and  extension holds the knee straight very well walking with a walker weight-bear as tolerated  Plan:  Continue icing 2-3 times a day Start home exercise program Return in 4 weeks

## 2020-10-23 NOTE — Patient Instructions (Signed)
Ice the knee several times a day  Follow the instructions for home exercises

## 2020-11-01 ENCOUNTER — Encounter: Payer: Self-pay | Admitting: Orthopedic Surgery

## 2020-11-19 ENCOUNTER — Other Ambulatory Visit (HOSPITAL_COMMUNITY): Payer: Self-pay | Admitting: Hematology

## 2020-11-19 DIAGNOSIS — R928 Other abnormal and inconclusive findings on diagnostic imaging of breast: Secondary | ICD-10-CM

## 2020-11-20 ENCOUNTER — Other Ambulatory Visit: Payer: Self-pay

## 2020-11-20 ENCOUNTER — Encounter: Payer: Self-pay | Admitting: Orthopedic Surgery

## 2020-11-20 ENCOUNTER — Ambulatory Visit (INDEPENDENT_AMBULATORY_CARE_PROVIDER_SITE_OTHER): Payer: Medicare HMO | Admitting: Orthopedic Surgery

## 2020-11-20 VITALS — BP 160/79 | HR 83 | Ht 65.0 in | Wt 170.0 lb

## 2020-11-20 DIAGNOSIS — Z9889 Other specified postprocedural states: Secondary | ICD-10-CM

## 2020-11-20 NOTE — Progress Notes (Signed)
Chief Complaint  Patient presents with  . Routine Post Op    Rt knee DOS 10/18/20   Encounter Diagnosis  Name Primary?  . S/P right knee arthroscopy 10/18/20 Yes     Global period October 18, 2020 through January 16, 2021  Postop appointment status post arthroscopy right knee partial lateral meniscectomy patient had some chondral changes on the lateral side and patellofemoral joint  Complains of minor discomfort.  She is regained her range of motion and independent walking ability  Her knee looks good she has excellent range of motion no swelling  She will continue with Tylenol Advil as needed she can use Voltaren gel as needed otherwise follow-up as needed

## 2020-11-20 NOTE — Patient Instructions (Signed)
USE TYLENOL OR ADVIL AS NEEDED

## 2020-11-29 ENCOUNTER — Other Ambulatory Visit (HOSPITAL_COMMUNITY): Payer: Self-pay

## 2020-11-29 DIAGNOSIS — Z170 Estrogen receptor positive status [ER+]: Secondary | ICD-10-CM

## 2020-12-03 ENCOUNTER — Inpatient Hospital Stay (HOSPITAL_COMMUNITY): Payer: Medicare HMO

## 2020-12-03 ENCOUNTER — Ambulatory Visit (HOSPITAL_COMMUNITY): Payer: Medicare HMO

## 2020-12-05 ENCOUNTER — Telehealth (HOSPITAL_COMMUNITY): Payer: Medicare HMO | Admitting: Oncology

## 2020-12-06 ENCOUNTER — Ambulatory Visit (HOSPITAL_COMMUNITY): Payer: Medicare HMO | Admitting: Nurse Practitioner

## 2020-12-10 ENCOUNTER — Ambulatory Visit (HOSPITAL_COMMUNITY): Payer: Medicare HMO | Admitting: Oncology

## 2020-12-17 ENCOUNTER — Inpatient Hospital Stay (HOSPITAL_COMMUNITY): Payer: Medicare HMO | Attending: Hematology

## 2020-12-17 ENCOUNTER — Other Ambulatory Visit: Payer: Self-pay

## 2020-12-17 ENCOUNTER — Ambulatory Visit (HOSPITAL_COMMUNITY)
Admission: RE | Admit: 2020-12-17 | Discharge: 2020-12-17 | Disposition: A | Discharge status: Home / Self Care | Payer: Medicare HMO | Source: Ambulatory Visit | Attending: Hematology | Admitting: Hematology

## 2020-12-17 ENCOUNTER — Ambulatory Visit (HOSPITAL_COMMUNITY)
Admission: RE | Admit: 2020-12-17 | Discharge: 2020-12-17 | Disposition: A | Discharge status: Home / Self Care | Payer: Medicare HMO | Source: Ambulatory Visit | Attending: Nurse Practitioner | Admitting: Nurse Practitioner

## 2020-12-17 DIAGNOSIS — G8929 Other chronic pain: Secondary | ICD-10-CM | POA: Insufficient documentation

## 2020-12-17 DIAGNOSIS — Z13820 Encounter for screening for osteoporosis: Secondary | ICD-10-CM | POA: Insufficient documentation

## 2020-12-17 DIAGNOSIS — M25561 Pain in right knee: Secondary | ICD-10-CM | POA: Insufficient documentation

## 2020-12-17 DIAGNOSIS — R928 Other abnormal and inconclusive findings on diagnostic imaging of breast: Secondary | ICD-10-CM | POA: Diagnosis present

## 2020-12-17 DIAGNOSIS — M8580 Other specified disorders of bone density and structure, unspecified site: Secondary | ICD-10-CM | POA: Insufficient documentation

## 2020-12-17 DIAGNOSIS — Z853 Personal history of malignant neoplasm of breast: Secondary | ICD-10-CM | POA: Insufficient documentation

## 2020-12-17 DIAGNOSIS — Z780 Asymptomatic menopausal state: Secondary | ICD-10-CM | POA: Diagnosis not present

## 2020-12-17 DIAGNOSIS — C50912 Malignant neoplasm of unspecified site of left female breast: Secondary | ICD-10-CM | POA: Diagnosis present

## 2020-12-17 DIAGNOSIS — C50312 Malignant neoplasm of lower-inner quadrant of left female breast: Secondary | ICD-10-CM | POA: Insufficient documentation

## 2020-12-17 DIAGNOSIS — Z170 Estrogen receptor positive status [ER+]: Secondary | ICD-10-CM | POA: Diagnosis not present

## 2020-12-17 DIAGNOSIS — E538 Deficiency of other specified B group vitamins: Secondary | ICD-10-CM | POA: Diagnosis not present

## 2020-12-17 DIAGNOSIS — Z79811 Long term (current) use of aromatase inhibitors: Secondary | ICD-10-CM | POA: Insufficient documentation

## 2020-12-17 LAB — COMPREHENSIVE METABOLIC PANEL
ALT: 18 U/L (ref 0–44)
AST: 21 U/L (ref 15–41)
Albumin: 3.8 g/dL (ref 3.5–5.0)
Alkaline Phosphatase: 54 U/L (ref 38–126)
Anion gap: 8 (ref 5–15)
BUN: 21 mg/dL (ref 8–23)
CO2: 25 mmol/L (ref 22–32)
Calcium: 9.8 mg/dL (ref 8.9–10.3)
Chloride: 106 mmol/L (ref 98–111)
Creatinine, Ser: 1.12 mg/dL — ABNORMAL HIGH (ref 0.44–1.00)
GFR, Estimated: 51 mL/min — ABNORMAL LOW (ref >60)
Glucose, Bld: 105 mg/dL — ABNORMAL HIGH (ref 70–99)
Potassium: 3.9 mmol/L (ref 3.5–5.1)
Sodium: 139 mmol/L (ref 135–145)
Total Bilirubin: 0.5 mg/dL (ref 0.3–1.2)
Total Protein: 7.1 g/dL (ref 6.5–8.1)

## 2020-12-17 LAB — CBC WITH DIFFERENTIAL/PLATELET
Abs Immature Granulocytes: 0.03 10*3/uL (ref 0.00–0.07)
Basophils Absolute: 0 10*3/uL (ref 0.0–0.1)
Basophils Relative: 0 %
Eosinophils Absolute: 0.1 10*3/uL (ref 0.0–0.5)
Eosinophils Relative: 1 %
HCT: 38.4 % (ref 36.0–46.0)
Hemoglobin: 12.2 g/dL (ref 12.0–15.0)
Immature Granulocytes: 1 %
Lymphocytes Relative: 22 %
Lymphs Abs: 1.4 10*3/uL (ref 0.7–4.0)
MCH: 28.6 pg (ref 26.0–34.0)
MCHC: 31.8 g/dL (ref 30.0–36.0)
MCV: 90.1 fL (ref 80.0–100.0)
Monocytes Absolute: 0.6 10*3/uL (ref 0.1–1.0)
Monocytes Relative: 10 %
Neutro Abs: 4.3 10*3/uL (ref 1.7–7.7)
Neutrophils Relative %: 66 %
Platelets: 212 10*3/uL (ref 150–400)
RBC: 4.26 MIL/uL (ref 3.87–5.11)
RDW: 14.6 % (ref 11.5–15.5)
WBC: 6.5 10*3/uL (ref 4.0–10.5)
nRBC: 0 % (ref 0.0–0.2)

## 2020-12-17 LAB — LACTATE DEHYDROGENASE: LDH: 164 U/L (ref 98–192)

## 2020-12-17 LAB — VITAMIN D 25 HYDROXY (VIT D DEFICIENCY, FRACTURES): Vit D, 25-Hydroxy: 44.83 ng/mL (ref 30–100)

## 2020-12-17 LAB — VITAMIN B12: Vitamin B-12: 128 pg/mL — ABNORMAL LOW (ref 180–914)

## 2020-12-30 ENCOUNTER — Encounter (HOSPITAL_COMMUNITY): Payer: Self-pay | Admitting: Hematology

## 2020-12-30 ENCOUNTER — Other Ambulatory Visit: Payer: Self-pay

## 2020-12-30 ENCOUNTER — Inpatient Hospital Stay (HOSPITAL_BASED_OUTPATIENT_CLINIC_OR_DEPARTMENT_OTHER): Payer: Medicare HMO | Admitting: Hematology

## 2020-12-30 DIAGNOSIS — C50312 Malignant neoplasm of lower-inner quadrant of left female breast: Secondary | ICD-10-CM

## 2020-12-30 DIAGNOSIS — Z170 Estrogen receptor positive status [ER+]: Secondary | ICD-10-CM

## 2020-12-30 NOTE — Progress Notes (Signed)
Virtual Visit via Telephone Note  I connected with [name] on 12/30/20 at  4:15 PM EST by telephone and verified that I am speaking with the correct person using two identifiers.  Location: Patient: At home Provider: In the office   I discussed the limitations, risks, security and privacy concerns of performing an evaluation and management service by telephone and the availability of in person appointments. I also discussed with the patient that there may be a patient responsible charge related to this service. The patient expressed understanding and agreed to proceed.   History of Present Illness: She is seen in our clinic for stage I left breast cancer for which lumpectomy was done on 06/26/2015, ER/PR positive and HER-2 negative.  She received radiation from 09/10/2015 through 10/08/2015.  Anastrozole started in November 2016.   Observations/Objective: She denies any new onset pains.  Has chronic pain of the right knee.  Appetite is 100%.  Energy levels are 50%.  Assessment and Plan:  1.  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 -She will have bilateral mammograms in July of this year. -She is tolerating anastrozole very well.  She would like to continue beyond 5 years. -Reviewed labs which showed normal chemistries and CBC. -RTC 6 months for follow-up.  2.  Bone health: -DEXA scan on 12/17/2020 shows T score -0.8. -Vitamin D level was 44.83. -Continue calcium supplements in the form of Tums.  Continue vitamin D 1000 units daily.  3.  Vitamin B12 deficiency: -Vitamin B12 level was low at 128. -Recommend starting B12 1 mg tablet daily.   Follow Up Instructions: RTC 6 months with labs.   I discussed the assessment and treatment plan with the patient. The patient was provided an opportunity to ask questions and all were  answered. The patient agreed with the plan and demonstrated an understanding of the instructions.   The patient was advised to call back or seek an in-person evaluation if the symptoms worsen or if the condition fails to improve as anticipated.  I provided 12 minutes of non-face-to-face time during this encounter.   Sreedhar Katragadda, MD   

## 2020-12-30 NOTE — Progress Notes (Signed)
Pt is taking Anastrozole as prescribed with no side effects. 

## 2020-12-31 ENCOUNTER — Other Ambulatory Visit (HOSPITAL_COMMUNITY): Payer: Self-pay

## 2020-12-31 DIAGNOSIS — C50312 Malignant neoplasm of lower-inner quadrant of left female breast: Secondary | ICD-10-CM

## 2021-01-29 DIAGNOSIS — E538 Deficiency of other specified B group vitamins: Secondary | ICD-10-CM | POA: Insufficient documentation

## 2021-02-10 ENCOUNTER — Encounter: Payer: Self-pay | Admitting: Orthopedic Surgery

## 2021-02-10 ENCOUNTER — Other Ambulatory Visit: Payer: Self-pay

## 2021-02-10 ENCOUNTER — Ambulatory Visit: Payer: Medicare HMO | Admitting: Orthopedic Surgery

## 2021-02-10 VITALS — BP 144/75 | HR 92 | Ht 65.0 in | Wt 170.0 lb

## 2021-02-10 DIAGNOSIS — M25461 Effusion, right knee: Secondary | ICD-10-CM

## 2021-02-10 DIAGNOSIS — Z9889 Other specified postprocedural states: Secondary | ICD-10-CM

## 2021-02-10 DIAGNOSIS — M1711 Unilateral primary osteoarthritis, right knee: Secondary | ICD-10-CM | POA: Diagnosis not present

## 2021-02-10 DIAGNOSIS — M1710 Unilateral primary osteoarthritis, unspecified knee: Secondary | ICD-10-CM

## 2021-02-10 DIAGNOSIS — G8929 Other chronic pain: Secondary | ICD-10-CM

## 2021-02-10 NOTE — Progress Notes (Signed)
Chief Complaint  Patient presents with  . Post-op Follow-up    Right knee scope 10/18/20 has had swelling     75 year old female status post arthroscopy in December 2021 comes in today complaining of swelling, pain and decreased flexion  Her primary care doctor saw her and thought she should see me.  She said the swelling is getting better Findings:  Lateral: torn lateral meniscus posterior horn raial tear               Grade 4 tibial lesion dime size, grade 2 lesion diffuse femur   Past Medical History:  Diagnosis Date  . Abdominal hernia    per CT 04-28-2018  abdominal/ pelvic anterior wall hernia  . Arthritis   . CKD (chronic kidney disease), stage III ([place])   . Disorder of bone and cartilage, unspecified   . Essential hypertension, benign   . Full dentures   . GERD (gastroesophageal reflux disease)   . 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 History of kidney stones   . History of radiation therapy 09-10-2015 to [phone]   left breast 50Gy  . Iron deficiency anemia   . Malignant neoplasm of lower-inner quadrant of left breast in female, estrogen receptor positive [facility]) oncologist-  dr Mathis Dad higgs ([facility])   dx 08-16-2015--  Stage 1A invasisive (T1c,N0,M0), ER/PR+, HER2 negative---- 06-26-2015 left partial mastectomy , 07-31-2015 left axilla node dissection x1 (negative)-- completed radiation 10-08-2015 and started arimidex 10-16-2015  . Mixed hyperlipidemia   . Nephrolithiasis    bilateral per CT 04-28-2018  . Type 2 diabetes mellitus ([place])    followed by pcp  . Ureteral calculi    left  . Urgency of urination   . Wears glasses    Physical Exam Constitutional:      General: She is not in acute distress.    Appearance: She is well-developed.     Comments: Well developed, well nourished Normal grooming and hygiene     Cardiovascular:     Comments: No peripheral edema Musculoskeletal:     Right knee: Swelling,  effusion and crepitus present. No deformity, erythema, ecchymosis or lacerations. Decreased range of motion. Tenderness present over the medial joint line and lateral joint line. No LCL laxity, MCL laxity, ACL laxity or PCL laxity. Normal alignment, normal meniscus and normal patellar mobility.     Instability Tests: Anterior drawer test negative. Posterior drawer test negative.  Skin:    General: Skin is warm and dry.  Neurological:     Mental Status: She is alert and oriented to person, place, and time.     Sensory: No sensory deficit.     Coordination: Coordination normal.     Gait: Gait normal.     Deep Tendon Reflexes: Reflexes are normal and symmetric.  Psychiatric:        Mood and Affect: Mood normal.        Behavior: Behavior normal.        Thought Content: Thought content normal.        Judgment: Judgment normal.     Comments: Affect normal     Encounter Diagnoses  Name Primary?  . S/P right knee arthroscopy 10/18/20   . Primary localized osteoarthritis of knee   . Chronic pain of right knee   . Effusion, right knee Yes    Looks like she had an acute inflammatory response to her arthritic condition in her knee  based on her arthroscopy pictures  We aspirated the knee with her permission of course.  We got out 13 cc of yellow thick fluid no signs of infection or severe inflammation in the fluid  Procedure note: We injected Celestone 6 mg and lidocaine 1% 2 cc  Patient will continue with ice and Tylenol arthritis follow-up with [place] as needed  Chronic with exacerbation plus injection

## 2021-02-10 NOTE — Patient Instructions (Signed)
Ice the knee 3 times a day for the next week wear support wrap for 2 days

## 2021-04-17 ENCOUNTER — Other Ambulatory Visit (HOSPITAL_COMMUNITY): Payer: Self-pay | Admitting: Hematology

## 2021-04-17 DIAGNOSIS — N6489 Other specified disorders of breast: Secondary | ICD-10-CM

## 2021-05-27 ENCOUNTER — Other Ambulatory Visit: Payer: Self-pay

## 2021-05-27 ENCOUNTER — Ambulatory Visit (HOSPITAL_COMMUNITY)
Admission: RE | Admit: 2021-05-27 | Discharge: 2021-05-27 | Disposition: A | Discharge status: Home / Self Care | Payer: Medicare HMO | Source: Ambulatory Visit | Attending: Hematology | Admitting: Hematology

## 2021-05-27 DIAGNOSIS — N6489 Other specified disorders of breast: Secondary | ICD-10-CM | POA: Insufficient documentation

## 2021-05-27 DIAGNOSIS — C50312 Malignant neoplasm of lower-inner quadrant of left female breast: Secondary | ICD-10-CM | POA: Diagnosis not present

## 2021-05-27 DIAGNOSIS — Z170 Estrogen receptor positive status [ER+]: Secondary | ICD-10-CM | POA: Diagnosis present

## 2021-06-26 ENCOUNTER — Inpatient Hospital Stay (HOSPITAL_COMMUNITY): Payer: Medicare HMO | Attending: Hematology

## 2021-06-26 ENCOUNTER — Other Ambulatory Visit: Payer: Self-pay

## 2021-06-26 DIAGNOSIS — E538 Deficiency of other specified B group vitamins: Secondary | ICD-10-CM | POA: Diagnosis present

## 2021-06-26 DIAGNOSIS — Z170 Estrogen receptor positive status [ER+]: Secondary | ICD-10-CM | POA: Diagnosis present

## 2021-06-26 DIAGNOSIS — C50912 Malignant neoplasm of unspecified site of left female breast: Secondary | ICD-10-CM | POA: Diagnosis not present

## 2021-06-26 DIAGNOSIS — Z79811 Long term (current) use of aromatase inhibitors: Secondary | ICD-10-CM | POA: Insufficient documentation

## 2021-06-26 DIAGNOSIS — C50312 Malignant neoplasm of lower-inner quadrant of left female breast: Secondary | ICD-10-CM

## 2021-06-26 LAB — CBC WITH DIFFERENTIAL/PLATELET
Abs Immature Granulocytes: 0.03 10*3/uL (ref 0.00–0.07)
Basophils Absolute: 0 10*3/uL (ref 0.0–0.1)
Basophils Relative: 0 %
Eosinophils Absolute: 0 10*3/uL (ref 0.0–0.5)
Eosinophils Relative: 0 %
HCT: 39 % (ref 36.0–46.0)
Hemoglobin: 12.3 g/dL (ref 12.0–15.0)
Immature Granulocytes: 1 %
Lymphocytes Relative: 15 %
Lymphs Abs: 0.9 10*3/uL (ref 0.7–4.0)
MCH: 28.9 pg (ref 26.0–34.0)
MCHC: 31.5 g/dL (ref 30.0–36.0)
MCV: 91.8 fL (ref 80.0–100.0)
Monocytes Absolute: 0.9 10*3/uL (ref 0.1–1.0)
Monocytes Relative: 15 %
Neutro Abs: 4 10*3/uL (ref 1.7–7.7)
Neutrophils Relative %: 69 %
Platelets: 205 10*3/uL (ref 150–400)
RBC: 4.25 MIL/uL (ref 3.87–5.11)
RDW: 14.7 % (ref 11.5–15.5)
WBC: 5.7 10*3/uL (ref 4.0–10.5)
nRBC: 0 % (ref 0.0–0.2)

## 2021-06-26 LAB — COMPREHENSIVE METABOLIC PANEL WITH GFR
ALT: 20 U/L (ref 0–44)
AST: 23 U/L (ref 15–41)
Albumin: 4 g/dL (ref 3.5–5.0)
Alkaline Phosphatase: 57 U/L (ref 38–126)
Anion gap: 8 (ref 5–15)
BUN: 25 mg/dL — ABNORMAL HIGH (ref 8–23)
CO2: 24 mmol/L (ref 22–32)
Calcium: 9.9 mg/dL (ref 8.9–10.3)
Chloride: 103 mmol/L (ref 98–111)
Creatinine, Ser: 1.39 mg/dL — ABNORMAL HIGH (ref 0.44–1.00)
GFR, Estimated: 39 mL/min — ABNORMAL LOW
Glucose, Bld: 134 mg/dL — ABNORMAL HIGH (ref 70–99)
Potassium: 4.7 mmol/L (ref 3.5–5.1)
Sodium: 135 mmol/L (ref 135–145)
Total Bilirubin: 0.5 mg/dL (ref 0.3–1.2)
Total Protein: 7.1 g/dL (ref 6.5–8.1)

## 2021-06-26 LAB — VITAMIN B12: Vitamin B-12: 878 pg/mL (ref 180–914)

## 2021-06-30 ENCOUNTER — Other Ambulatory Visit (HOSPITAL_COMMUNITY): Payer: Self-pay | Admitting: Hematology

## 2021-06-30 DIAGNOSIS — C50312 Malignant neoplasm of lower-inner quadrant of left female breast: Secondary | ICD-10-CM

## 2021-07-03 ENCOUNTER — Other Ambulatory Visit: Payer: Self-pay

## 2021-07-03 ENCOUNTER — Ambulatory Visit (HOSPITAL_COMMUNITY): Payer: Medicare HMO | Admitting: Hematology

## 2021-07-03 ENCOUNTER — Inpatient Hospital Stay (HOSPITAL_COMMUNITY): Payer: Medicare HMO | Admitting: Nurse Practitioner

## 2021-07-03 ENCOUNTER — Other Ambulatory Visit (HOSPITAL_COMMUNITY): Payer: Self-pay

## 2021-07-03 VITALS — BP 139/58 | HR 92 | Temp 98.1°F | Resp 16 | Wt 173.3 lb

## 2021-07-03 DIAGNOSIS — C50312 Malignant neoplasm of lower-inner quadrant of left female breast: Secondary | ICD-10-CM

## 2021-07-03 DIAGNOSIS — Z170 Estrogen receptor positive status [ER+]: Secondary | ICD-10-CM

## 2021-07-03 DIAGNOSIS — Z79811 Long term (current) use of aromatase inhibitors: Secondary | ICD-10-CM

## 2021-07-03 MED ORDER — ANASTROZOLE 1 MG PO TABS: 1.0000 mg | ORAL_TABLET | Freq: Every day | ORAL | 3 refills | Status: DC

## 2021-07-03 NOTE — Patient Instructions (Signed)
[place] at [facility] Discharge Instructions  You were seen today by Beckey Rutter, NP.  We will get you scheduled for your mammogram in July 2023 and your bone density in 2024. Anastrozole will be continued until 2027.  Please follow up as scheduled.   Thank you for choosing [place] at [facility] to provide your oncology and hematology care.  To afford each patient quality time with our provider, please arrive at least 15 minutes before your scheduled appointment time.   If you have a lab appointment with the [facility] please come in thru the Main Entrance and check in at the main information desk.  You need to re-schedule your appointment should you arrive 10 or more minutes late.  We strive to give you quality time with our providers, and arriving late affects you and other patients whose appointments are after yours.  Also, if you no show three or more times for appointments you may be dismissed from the clinic at the providers discretion.     Again, thank you for choosing [facility].  Our hope is that these requests will decrease the amount of time that you wait before being seen by our physicians.       _____________________________________________________________  Should you have questions after your visit to [facility], please contact our office at [phone] and follow the prompts.  Our office hours are 8:00 a.m. and 4:30 p.m. Monday - Friday.  Please note that voicemails left after 4:00 p.m. may not be returned until the following business day.  We are closed weekends and major holidays.  You do have access to a nurse 24-7, just call the main number to the clinic [phone] and do not press any options, hold on the line and a nurse will answer the phone.    For prescription refill requests, have your pharmacy contact our office and allow 72 hours.    Due to Covid, you will need to wear a mask upon  entering the hospital. If you do not have a mask, a mask will be given to you at the Main Entrance upon arrival. For doctor visits, patients may have 1 support person age 17 or older with them. For treatment visits, patients can not have anyone with them due to social distancing guidelines and our immunocompromised population.

## 2021-07-03 NOTE — Progress Notes (Signed)
[place] at [facility] Virtual Visit Progress Note  I connected with Larwance Sachs on 07/03/21 at  9:00 AM EDT by video enabled telemedicine visit and verified that I am speaking with the correct person using two identifiers.   I discussed the limitations, risks, security and privacy concerns of performing an evaluation and management service by telemedicine and the availability of in-person appointments. I also discussed with the patient that there may be a patient responsible charge related to this service. The patient expressed understanding and agreed to proceed.   Other persons participating in the visit and their role in the encounter: RN, NP, Patient   Patient's location: [facility]  Provider's location: [facility]   Chief Complaint: f/u for hx of breast cancer   I discussed the limitations, risks, security and privacy concerns of performing an evaluation and management service by telephone and the availability of in person appointments. I also discussed with the patient that there may be a patient responsible charge related to this service. The patient expressed understanding and agreed to proceed.   History of Present Illness: Patient is 75 year old female who returns to clinic for follow up for history of breast cancer. She feels well. No fevers or chills. No recent infections. No lumps or bumps. Appetite is good and she denies weight loss. No neurologic complaints. No urinary complaints. No melena or hematochezia. No abdominal pain, nausea or vomiting. No other specific complaints today.    Review of Systems  Constitutional:  Negative for chills, fever, malaise/fatigue and weight loss.  HENT:  Negative for hearing loss, nosebleeds, sore throat and tinnitus.   Eyes:  Negative for blurred vision and double vision.  Respiratory:  Negative for cough, hemoptysis, shortness of breath and wheezing.   Cardiovascular:  Negative for chest pain, palpitations and leg swelling.   Gastrointestinal:  Negative for abdominal pain, blood in stool, constipation, diarrhea, melena, nausea and vomiting.  Genitourinary:  Negative for dysuria and urgency.  Musculoskeletal:  Negative for back pain, falls, joint pain and myalgias.  Skin:  Negative for itching and rash.  Neurological:  Negative for dizziness, tingling, sensory change, loss of consciousness, weakness and headaches.  Endo/Heme/Allergies:  Negative for environmental allergies. Does not bruise/bleed easily.  Psychiatric/Behavioral:  Negative for depression. The patient is not nervous/anxious and does not have insomnia.      Observations/Objective: Today's Vitals   07/03/21 0911 07/03/21 0916  BP:  (!) 139/58  Pulse:  92  Resp:  16  Temp:  98.1 F (36.7 C)  TempSrc:  Oral  SpO2:  97%  Weight:  173 lb 4.8 oz (78.6 kg)  PainSc: 0-No pain    There is no height or weight on file to calculate BMI. Physical Exam Constitutional:      Appearance: She is not ill-appearing.  Pulmonary:     Effort: No respiratory distress.  Neurological:     Mental Status: She is alert and oriented to person, place, and time.  Psychiatric:        Mood and Affect: Mood normal.        Behavior: Behavior normal.     Assessment and Plan:  1.  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 She is currently on anastrozole. Plan to continue for extended adjuvant therapy of 10 years. She has now completed 5 years.  - Clinically asymptomatic. - reviewed mammogram results from 05/27/21 which did not show mammographic evidence of malignancy in either breast -  Plan to continue surveillance annually with physical exam and mammogram   2.  Bone health: -DEXA scan on 12/17/2020 shows T score -0.8. -Vitamin D level was 44.83. -Continue calcium supplements in the form of Tums.  Continue vitamin D 1000 units daily. - weight bearing exercise as tolerated - in setting of AI treatment continue dexa scans every 2 years. Next due  12/17/2022  3.  Vitamin B12 deficiency: -Vitamin B12 level was low at 128. -She continues oral vitamin b12. Tolerating well. - levels today are noraml - check annually       Return to clinic: 1 year- mammogram (July 2022) - labs (cbc, cmp, b12), MD   I discussed the assessment and treatment plan with the patient. The patient was provided an opportunity to ask questions and all were answered. The patient agreed with the plan and demonstrated an understanding of the instructions.   The patient was advised to call back or seek an in-person evaluation if the symptoms worsen or if the condition fails to improve as anticipated.   I spent 25 minutes face-to-face video visit time dedicated to the care of this patient on the date of this encounter to include pre-visit review of medical oncology notes, imaging, labs, face-to-face time with the patient, and post visit ordering of testing/documentation.    Beckey Rutter, DNP, AGNP-C [place] [phone]

## 2021-07-03 NOTE — Progress Notes (Signed)
Patient is taking anastrozole as prescribed.  They have not missed any doses and report no side effects at this time.   

## 2021-07-08 ENCOUNTER — Other Ambulatory Visit (HOSPITAL_COMMUNITY): Payer: Self-pay

## 2021-07-08 DIAGNOSIS — Z170 Estrogen receptor positive status [ER+]: Secondary | ICD-10-CM

## 2021-11-16 HISTORY — PX: BREAST LUMPECTOMY: SHX2

## 2022-05-29 ENCOUNTER — Inpatient Hospital Stay (HOSPITAL_COMMUNITY): Payer: Medicare PPO | Attending: Hematology

## 2022-05-29 ENCOUNTER — Ambulatory Visit (HOSPITAL_COMMUNITY)
Admission: RE | Admit: 2022-05-29 | Discharge: 2022-05-29 | Disposition: A | Discharge status: Home / Self Care | Payer: Medicare PPO | Source: Ambulatory Visit | Attending: Hematology | Admitting: Hematology

## 2022-05-29 DIAGNOSIS — C50312 Malignant neoplasm of lower-inner quadrant of left female breast: Secondary | ICD-10-CM | POA: Insufficient documentation

## 2022-05-29 DIAGNOSIS — I129 Hypertensive chronic kidney disease with stage 1 through stage 4 chronic kidney disease, or unspecified chronic kidney disease: Secondary | ICD-10-CM | POA: Insufficient documentation

## 2022-05-29 DIAGNOSIS — Z170 Estrogen receptor positive status [ER+]: Secondary | ICD-10-CM

## 2022-05-29 DIAGNOSIS — E538 Deficiency of other specified B group vitamins: Secondary | ICD-10-CM | POA: Diagnosis not present

## 2022-05-29 DIAGNOSIS — Z1231 Encounter for screening mammogram for malignant neoplasm of breast: Secondary | ICD-10-CM | POA: Insufficient documentation

## 2022-05-29 DIAGNOSIS — E1122 Type 2 diabetes mellitus with diabetic chronic kidney disease: Secondary | ICD-10-CM | POA: Insufficient documentation

## 2022-05-29 DIAGNOSIS — Z923 Personal history of irradiation: Secondary | ICD-10-CM | POA: Diagnosis not present

## 2022-05-29 DIAGNOSIS — Z90710 Acquired absence of both cervix and uterus: Secondary | ICD-10-CM | POA: Insufficient documentation

## 2022-05-29 DIAGNOSIS — Z79811 Long term (current) use of aromatase inhibitors: Secondary | ICD-10-CM | POA: Diagnosis not present

## 2022-05-29 DIAGNOSIS — K219 Gastro-esophageal reflux disease without esophagitis: Secondary | ICD-10-CM | POA: Diagnosis not present

## 2022-05-29 DIAGNOSIS — N6489 Other specified disorders of breast: Secondary | ICD-10-CM | POA: Insufficient documentation

## 2022-05-29 DIAGNOSIS — Z87891 Personal history of nicotine dependence: Secondary | ICD-10-CM | POA: Insufficient documentation

## 2022-05-29 DIAGNOSIS — Z853 Personal history of malignant neoplasm of breast: Secondary | ICD-10-CM | POA: Diagnosis not present

## 2022-05-29 DIAGNOSIS — N1832 Chronic kidney disease, stage 3b: Secondary | ICD-10-CM | POA: Diagnosis not present

## 2022-05-29 LAB — CBC WITH DIFFERENTIAL/PLATELET
Abs Immature Granulocytes: 0.03 10*3/uL (ref 0.00–0.07)
Basophils Absolute: 0 10*3/uL (ref 0.0–0.1)
Basophils Relative: 1 %
Eosinophils Absolute: 0.1 10*3/uL (ref 0.0–0.5)
Eosinophils Relative: 2 %
HCT: 38.2 % (ref 36.0–46.0)
Hemoglobin: 12.1 g/dL (ref 12.0–15.0)
Immature Granulocytes: 1 %
Lymphocytes Relative: 16 %
Lymphs Abs: 1 10*3/uL (ref 0.7–4.0)
MCH: 28.1 pg (ref 26.0–34.0)
MCHC: 31.7 g/dL (ref 30.0–36.0)
MCV: 88.8 fL (ref 80.0–100.0)
Monocytes Absolute: 0.6 10*3/uL (ref 0.1–1.0)
Monocytes Relative: 9 %
Neutro Abs: 4.4 10*3/uL (ref 1.7–7.7)
Neutrophils Relative %: 71 %
Platelets: 207 10*3/uL (ref 150–400)
RBC: 4.3 MIL/uL (ref 3.87–5.11)
RDW: 15.7 % — ABNORMAL HIGH (ref 11.5–15.5)
WBC: 6.1 10*3/uL (ref 4.0–10.5)
nRBC: 0 % (ref 0.0–0.2)

## 2022-05-29 LAB — COMPREHENSIVE METABOLIC PANEL
ALT: 20 U/L (ref 0–44)
AST: 24 U/L (ref 15–41)
Albumin: 4.1 g/dL (ref 3.5–5.0)
Alkaline Phosphatase: 49 U/L (ref 38–126)
Anion gap: 7 (ref 5–15)
BUN: 28 mg/dL — ABNORMAL HIGH (ref 8–23)
CO2: 24 mmol/L (ref 22–32)
Calcium: 9.6 mg/dL (ref 8.9–10.3)
Chloride: 107 mmol/L (ref 98–111)
Creatinine, Ser: 1.33 mg/dL — ABNORMAL HIGH (ref 0.44–1.00)
GFR, Estimated: 41 mL/min — ABNORMAL LOW (ref >60)
Glucose, Bld: 97 mg/dL (ref 70–99)
Potassium: 4.9 mmol/L (ref 3.5–5.1)
Sodium: 138 mmol/L (ref 135–145)
Total Bilirubin: 0.4 mg/dL (ref 0.3–1.2)
Total Protein: 7.2 g/dL (ref 6.5–8.1)

## 2022-05-29 LAB — VITAMIN B12: Vitamin B-12: 333 pg/mL (ref 180–914)

## 2022-06-01 ENCOUNTER — Other Ambulatory Visit (HOSPITAL_COMMUNITY): Payer: Self-pay | Admitting: Hematology

## 2022-06-01 DIAGNOSIS — R928 Other abnormal and inconclusive findings on diagnostic imaging of breast: Secondary | ICD-10-CM

## 2022-06-01 DIAGNOSIS — N6489 Other specified disorders of breast: Secondary | ICD-10-CM

## 2022-06-04 NOTE — Progress Notes (Signed)
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  PRIOR THERAPY: - Lumpectomy (06/2015) - Radiation therapy (completed 09/2015)  CURRENT THERAPY: Anastrozole  BRIEF ONCOLOGIC HISTORY:  Oncology History  Breast Sims ([place]) (Resolved)  08/16/2015 Initial Diagnosis   Breast Sims ([place])   09/11/2015 -  Radiation Therapy   [name]   Breast Sims of lower-inner quadrant of left female breast ([place])  04/18/2015 Mammogram   Calcifications in the left breast   04/24/2015 Mammogram   BI-RADS Category 4 with indeterminate left breast calcification, 1 x 4.2 x 1.2 cm developing group of heterogeneous calcifications   05/02/2015 Pathology Results   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 ER+ 100%, PR+ 80%   05/02/2015 Procedure   Left breast stereotactic core needle biopsy   06/26/2015 Oncotype testing   ONCOTYPE DX assay with recurrence score result of 5, Low risk, 10 year risk of distance recurrence tamoxifen alone 5%   06/26/2015 Definitive Surgery   Breast partial mastectomy after needle localization with [name] no sentinel node performed   06/26/2015 Pathology Results   INVASIVE DUCTAL CARCINOMA, 2 CM. - HIGH GRADE DUCTAL CARCINOMA IN SITU. - INVASIVE CARCINOMA FOCALLY LESS THAN 0.1 CM FROM LATERAL MARGIN. - DUCTAL CARCINOMA IN SITU FOCALLY LESS THAN 0.1 CM FROM THE LATERAL MARGIN.   06/26/2015 Pathology Results   ER+ 100%, PR + 60%, Ki-67 25%, HER2 NEGATIVE   07/31/2015 Procedure   Sentinel node procedure   07/31/2015 Pathology Results   ONE BENIGN LYMPH NODE (0/1).   08/26/2015 Imaging   Bone density- Normal   09/10/2015 - 10/08/2015 Radiation Therapy   50 Gy left breast by [name].   10/16/2015 -  Anti-estrogen oral therapy    Arimidex   05/08/2016 Procedure   Stereotactic-guided biopsies of the calcifications of left breast.   05/08/2016 Pathology Results   Breast, left, needle core biopsy, lateral and ant to lumpectomy site FIBROCYSTIC CHANGES VASCULAR CALCIFICATIONS NO EVIDENCE OF MALIGNANCY   05/08/2016 Pathology Results   2. Breast, left, needle core biopsy, lumpectomy site FAT NECROSIS WITH FIBROSIS AND MICROCALCIFICATIONS NO EVIDENCE OF MALIGNANCY   06/09/2016 Surgery   Left breast biopsy for microcalcifications. Path: benign.     Sims STAGING: Sims Staging  Breast Sims of lower-inner quadrant of left female breast [facility]) Staging form: Breast, AJCC 7th Edition - Clinical stage from 11/19/2015: Stage IA (T1c, N0, M0) - Signed by [name] on 11/19/2015   INTERVAL HISTORY:  [name], a 76 y.o. female, returns for routine follow-up of her history of stage I left-sided breast Sims (diagnosed 2016). [name] was last seen on 07/03/2021 by [name].   At today's visit, she  reports feeling fairly well.  She denies any recent hospitalizations, surgeries, or changes in her  baseline health status.  She denies any symptoms of recurrence such as new lumps, bone pain, chest pain, dyspnea, or abdominal pain.  She has no new headaches, seizures, or focal neurologic deficits.  No B symptoms such as fever, chills, or unintentional weight loss.  She has night sweats associated with hot flashes.  She feels fatigued, which she attributes to decreased activity due to her sciatic nerve pain in her right hip.   She continues to take  Arimidex and has hot flashes at night.  Otherwise she is tolerating it very well. She continues to take her calcium (Tums twice daily) and vitamin D 1000 units daily.  She is taking vitamin B12 1000 mcg twice weekly.  She reports low energy and 100% appetite.  She is maintaining stable weight at this time.   REVIEW OF SYSTEMS:  Review of Systems   Constitutional:  Positive for fatigue. Negative for appetite change, chills, diaphoresis, fever and unexpected weight change.  HENT:   Negative for lump/mass and nosebleeds.   Eyes:  Negative for eye problems.  Respiratory:  Negative for cough, hemoptysis and shortness of breath.   Cardiovascular:  Negative for chest pain, leg swelling and palpitations.  Gastrointestinal:  Negative for abdominal pain, blood in stool, constipation, diarrhea, nausea and vomiting.  Genitourinary:  Positive for frequency and nocturia. Negative for hematuria.   Skin: Negative.   Neurological:  Negative for dizziness, headaches and light-headedness.  Hematological:  Does not bruise/bleed easily.    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   History of kidney stones    History of radiation therapy 09-10-2015 to [phone]   left breast 50Gy   Iron deficiency anemia    Malignant neoplasm of lower-inner quadrant of left breast in female, estrogen receptor positive [facility]) oncologist-  dr [name] ([facility])   dx 08-16-2015--  Stage 1A invasisive (T1c,N0,M0), ER/PR+, HER2 negative---- 06-26-2015 left partial mastectomy , 07-31-2015 left axilla node dissection x1 (negative)-- completed radiation 10-08-2015 and started arimidex 10-16-2015   Mixed hyperlipidemia    Nephrolithiasis    bilateral per CT 04-28-2018   Type 2 diabetes mellitus ([place])    followed by pcp   Ureteral calculi    left   Urgency of urination    Wears glasses    Past Surgical History:  Procedure Laterality Date   ABDOMINAL HYSTERECTOMY   2005   AXILLARY SENTINEL NODE BIOPSY Left 07/31/2015   Procedure: SENTINEL LYMPH NODE BIOPSY, LEFT AXILLA, ;  Surgeon: Jessica Signs, MD;  Location: [facility];  Service: General;  Laterality: Left;  Sentinel Node @ 1000   BREAST BIOPSY Left 06/09/2016   Procedure: LEFT BREAST BIOPSY AFTER NEEDLE LOCALIZATION;  Surgeon: Jessica Signs, MD;  Location: [facility];  Service: General;  Laterality: Left;   CARPAL TUNNEL RELEASE Left 11/02/2014   Procedure: CARPAL TUNNEL RELEASE;  Surgeon: Charlotte Crumb, MD;  Location: [place];  Service: Orthopedics;  Laterality: Left;   CATARACT EXTRACTION W/ INTRAOCULAR LENS  IMPLANT, BILATERAL  2007   COLONOSCOPY N/A 02/03/2017   Procedure: COLONOSCOPY;  Surgeon: Danie Binder, MD;  [facility];  Service: Endoscopy;  Laterality: N/A;  1245   COLONOSCOPY N/A 03/01/2020   Procedure: COLONOSCOPY;  Surgeon: Danie Binder, MD;  [facility];  Service: Endoscopy;  Laterality: N/A;  10:30am   COLONOSCOPY W/ BIOPSIES AND POLYPECTOMY     CYSTOSCOPY WITH RETROGRADE PYELOGRAM, URETEROSCOPY AND STENT PLACEMENT Left 05/09/2018   Procedure: CYSTOSCOPY WITH RETROGRADE PYELOGRAM, URETEROSCOPY AND STENT  PLACEMENT;  Surgeon: Cleon Gustin, MD;  Location: [facility];  Service: Urology;  Laterality: Left;   CYSTOSCOPY/URETEROSCOPY/HOLMIUM LASER/STENT PLACEMENT Bilateral 03/18/2017   Procedure: CYSTOSCOPY/ BILATERAL RETROGRADE/RIGHT URETEROSCOPY/ RIGHT HOLMIUM LASER APPLICATION/RIGHT URETERAL STENT PLACEMENT;  Surgeon: Irine Seal, MD;  Location: [facility];  Service: Urology;  Laterality: Bilateral;   EXTRACORPOREAL SHOCK WAVE LITHOTRIPSY Right 12/14/2019   Procedure: EXTRACORPOREAL SHOCK WAVE LITHOTRIPSY (ESWL);  Surgeon: Festus Aloe, MD;  Location: [facility];  Service: Urology;  Laterality: Right;   EXTRACORPOREAL SHOCK WAVE LITHOTRIPSY Left 02/01/2020   Procedure: EXTRACORPOREAL SHOCK WAVE LITHOTRIPSY (ESWL);  Surgeon: Alexis Frock,  MD;  Location: [facility];  Service: Urology;  Laterality: Left;   HOLMIUM LASER APPLICATION Left 7/61/6073   Procedure: HOLMIUM LASER APPLICATION;  Surgeon: Cleon Gustin, MD;  Location: [facility];  Service: Urology;  Laterality: Left;   KNEE ARTHROSCOPY WITH LATERAL MENISECTOMY Right 10/18/2020   Procedure: KNEE ARTHROSCOPY WITH LATERAL MENISECTOMY;  Surgeon: Carole Civil, MD;  Location: [facility];  Service: Orthopedics;  Laterality: Right;   OPEN REDUCTION INTERNAL FIXATION (ORIF) DISTAL RADIAL FRACTURE Left 11/02/2014   Procedure: OPEN REDUCTION INTERNAL FIXATION (ORIF) DISTAL RADIAL FRACTURE;  Surgeon: Charlotte Crumb, MD;  Location: [place];  Service: Orthopedics;  Laterality: Left;   PARTIAL MASTECTOMY WITH NEEDLE LOCALIZATION Left 06/26/2015   Procedure: PARTIAL MASTECTOMY WITH NEEDLE LOCALIZATION;  Surgeon: Jessica Signs, MD;  Location: [facility];  Service: General;  Laterality: Left;  Needle Loc @ 8:00am   POLYPECTOMY  02/03/2017   Procedure: POLYPECTOMY;  Surgeon: Danie Binder, MD;  [facility];  Service: Endoscopy;;  descending and hepatic flexure   POLYPECTOMY  03/01/2020   Procedure: POLYPECTOMY;  Surgeon: Danie Binder, MD;  [facility];  Service: Endoscopy;;  ascending;splenic flexure polyps x2;rectal   TARSAL METATARSAL ARTHRODESIS Left 09-07-2007   dr Beola Cord  [facility]   first and second tarsal metatarsal fusion, gastroc slide, neurolysis   TOTAL ABDOMINAL HYSTERECTOMY W/ BILATERAL SALPINGOOPHORECTOMY  10-21-2004   dr soper  [facility]    SOCIAL HISTORY:  Social History   Socioeconomic History   Marital status: Married    Spouse name: Therapist, nutritional   Number of children: 2   Years of education: gef   Highest education level: Not on file  Occupational History   Occupation: Retired  Tobacco Use   Smoking status: Former    Packs/day: 1.00    Years: 20.00    Total pack years: 20.00    Types: Cigarettes    Quit date:  06/05/1986    Years since quitting: 36.0   Smokeless tobacco: Never  Vaping Use   Vaping Use: Never used  Substance and Sexual Activity   Alcohol use: No   Drug use: No   Sexual activity: Never    Birth control/protection: Surgical  Other Topics Concern   Not on file  Social History Narrative   Not on file   Social Determinants of Health   Financial Resource Strain: Not on file  Food Insecurity: Not on file  Transportation Needs: Not on file  Physical Activity: Not on file  Stress: Not on file  Social Connections: Not on file  Intimate Partner Violence: Not on file    FAMILY HISTORY:  Family History  Problem Relation Age of Onset   Hypertension Mother    Osteoporosis Mother    Diabetes Sister        x3   Colon Sims Neg  Hx     CURRENT MEDICATIONS:  Current Outpatient Medications  Medication Sig Dispense Refill   acetaminophen (TYLENOL) 500 MG tablet Take 500 mg by mouth every 6 (six) hours as needed for moderate pain.     amLODipine-olmesartan (AZOR) 5-40 MG tablet Take 1 tablet by mouth daily.     anastrozole (ARIMIDEX) 1 MG tablet Take 1 tablet (1 mg total) by mouth daily. 90 tablet 3   Blood Glucose Calibration (ACCU-CHEK Jessica) SOLN      Blood Glucose Monitoring Suppl (ACCU-CHEK Jessica PLUS) w/Device KIT      calcium carbonate (TUMS EX) 750 MG chewable tablet Chew 750 mg by mouth daily.      Cholecalciferol (VITAMIN D3) 1000 units CAPS Take 1,000 Units by mouth daily.      Cyanocobalamin (VITAMIN B-12 PO) Take by mouth.     diclofenac Sodium (VOLTAREN) 1 % GEL Apply 1 application topically 4 (four) times daily as needed (knee pain).     fenofibrate 54 MG tablet Take 1 tablet by mouth daily.     glucose blood test strip 1 each 2 (two) times daily. Check Blood sugar 1-2 times daily     glucose blood test strip 1 each 2 (two) times daily. Check blood sugar 1-2 times daily     ibuprofen (ADVIL) 400 MG tablet Take 1 tablet (400 mg total) by mouth every 8 (eight) hours  as needed. 90 tablet 0   MAGNESIUM CITRATE PO Take by mouth.     metFORMIN (GLUCOPHAGE) 1000 MG tablet Take 1,000 mg by mouth 2 (two) times daily with a meal.      Multiple Vitamin (MULTIVITAMIN WITH MINERALS) TABS tablet Take 1 tablet by mouth every morning. Women's One A Day 50+     Omega-3 Fatty Acids (FISH OIL) 1000 MG CAPS Take 1,000 mg by mouth daily.     omeprazole (PRILOSEC) 10 MG capsule Take 10 mg by mouth daily.     simvastatin (ZOCOR) 40 MG tablet Take 40 mg by mouth at bedtime.      No current facility-administered medications for this visit.    ALLERGIES:  No Known Allergies  PHYSICAL EXAM:  Performance status (ECOG): 1 - Symptomatic but completely ambulatory  There were no vitals filed for this visit. Wt Readings from Last 3 Encounters:  07/03/21 173 lb 4.8 oz (78.6 kg)  02/10/21 170 lb (77.1 kg)  11/20/20 170 lb (77.1 kg)   Physical Exam Constitutional:      Appearance: Normal appearance. She is obese.  HENT:     Head: Normocephalic and atraumatic.     Mouth/Throat:     Mouth: Mucous membranes are moist.  Eyes:     Extraocular Movements: Extraocular movements intact.     Pupils: Pupils are equal, round, and reactive to light.  Cardiovascular:     Rate and Rhythm: Normal rate and regular rhythm.     Pulses: Normal pulses.     Heart sounds: Normal heart sounds.  Pulmonary:     Effort: Pulmonary effort is normal.     Breath sounds: Normal breath sounds.  Chest:       Comments: Scattered areas of fibrous tissue palpated in bilateral breasts. Abdominal:     General: Bowel sounds are normal.     Palpations: Abdomen is soft.     Tenderness: There is no abdominal tenderness.  Musculoskeletal:        General: No swelling.     Right lower leg: No edema.  Left lower leg: No edema.  Lymphadenopathy:     Cervical: No cervical adenopathy.  Skin:    General: Skin is warm and dry.  Neurological:     General: No focal deficit present.     Mental Status: She  is alert and oriented to person, place, and time.  Psychiatric:        Mood and Affect: Mood normal.        Behavior: Behavior normal.      LABORATORY DATA:  I have reviewed the labs as listed.     Latest Ref Rng & Units 05/29/2022    9:03 AM 06/26/2021   12:05 PM 12/17/2020    3:26 PM  CBC  WBC 4.0 - 10.5 K/uL 6.1  5.7  6.5   Hemoglobin 12.0 - 15.0 g/dL 12.1  12.3  12.2   Hematocrit 36.0 - 46.0 % 38.2  39.0  38.4   Platelets 150 - 400 K/uL 207  205  212       Latest Ref Rng & Units 05/29/2022    9:03 AM 06/26/2021   12:05 PM 12/17/2020    3:26 PM  CMP  Glucose 70 - 99 mg/dL 97  134  105   BUN 8 - 23 mg/dL $Remove'28  25  21   'OnhGoSe$ Creatinine 0.44 - 1.00 mg/dL 1.33  1.39  1.12   Sodium 135 - 145 mmol/L 138  135  139   Potassium 3.5 - 5.1 mmol/L 4.9  4.7  3.9   Chloride 98 - 111 mmol/L 107  103  106   CO2 22 - 32 mmol/L $RemoveB'24  24  25   'SxvviZUA$ Calcium 8.9 - 10.3 mg/dL 9.6  9.9  9.8   Total Protein 6.5 - 8.1 g/dL 7.2  7.1  7.1   Total Bilirubin 0.3 - 1.2 mg/dL 0.4  0.5  0.5   Alkaline Phos 38 - 126 U/L 49  57  54   AST 15 - 41 U/L $Remo'24  23  21   'jfhEJ$ ALT 0 - 44 U/L $Remo'20  20  18     'ygnWw$ DIAGNOSTIC IMAGING:  I have independently reviewed the scans and discussed with the patient. MM 3D SCREEN BREAST BILATERAL  Result Date: 06/01/2022 CLINICAL DATA:  Screening. EXAM: DIGITAL SCREENING BILATERAL MAMMOGRAM WITH TOMOSYNTHESIS AND CAD TECHNIQUE: Bilateral screening digital craniocaudal and mediolateral oblique mammograms were obtained. Bilateral screening digital breast tomosynthesis was performed. The images were evaluated with computer-aided detection. COMPARISON:  Previous exam(s). ACR Breast Density Category b: There are scattered areas of fibroglandular density. FINDINGS: In the right breast, a possible asymmetry warrants further evaluation. In the left breast, no findings suspicious for malignancy. IMPRESSION: Further evaluation is suggested for possible asymmetry in the right breast. RECOMMENDATION: Diagnostic  mammogram and possibly ultrasound of the right breast. (Code:[id number]-[id number]) The patient will be contacted regarding the findings, and additional imaging will be scheduled. BI-RADS CATEGORY  0: Incomplete. Need additional imaging evaluation and/or prior mammograms for comparison. Electronically Signed   By: Audie Pinto M.D.   On: 06/01/2022 08:59     ASSESSMENT & PLAN: 1.  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 - Radiation therapy from 09/10/2015 through 10/08/2015. - Anastrozole started in November 2016.  Oncotype DX with recurrence score of 5, low risk (10-year risk of distant recurrence 5%).  Patient has opted to continue beyond 5 years  of treatment. - Previous left breast mammogram and targeted ultrasound (05/21/2020) showed a probable area of evolving fat necrosis containing 2 cysts/oil cysts in the 1 o'clock position 12 cm from the nipple, altogether measuring up to 2.1 cm) - Follow-up diagnostic mammogram with targeted ultrasound (12/17/2020) showed small cyst/oil cyst in left breast at 1 o'clock position, 10 cm from nipple, measuring 0.2 x 0.1 x 0.2 cm, with additional scattered tiny oil cyst and fat necrosis in the left breast - Most recent screening mammogram (05/29/2022): No findings suspicious for malignancy in left breast.  Possible asymmetry in right breast warrants further evaluation. - Most recent labs (05/29/2022): Normal CBC, CMP unremarkable with baseline CKD stage IIIb, normal LFTs. - No "red flag" symptoms of recurrence per patient history  - Physical exam revealed left breast lumpectomy scar and left nipple inversion (present since since lumpectomy), right breast with possible linear nodularity above right nipple, about 3 cm in length.  Scattered areas of fibrous tissue palpated in bilateral breasts. - We will see her in 6 months with  repeat mammogram and labs. - PLAN: Further evaluation recommended for possible asymmetry in right breast, she is scheduled for diagnostic mammogram and ultrasound of right breast and axilla on 06/19/2022.   Additional plan pending results. - If further evaluation is nonconcerning, we will plan on repeat office visit, mammogram, and labs in 1 year.    2.  Bone health - DEXA scan 09/08/2018 showed T score of -0.4. - DEXA scan 12/17/2020 showed normal T score of -0.8 - She is taking Tums twice daily.  She also takes vitamin D 1000 units daily. - PLAN: Continue calcium supplement in the form of Tums.  Continue vitamin D 1000 units daily. - In the setting of AI treatment we will continue DEXA scans every 2 years, next due February 2024.  3.  Vitamin B12 deficiency - Previous labs (12/17/2020) showed vitamin B12 level 128 - She is taking vitamin B12 1000 mcg tablet on Saturday on Sunday - Most recent labs (05/29/2022) show normal vitamin B12 at 333 - PLAN: Increase vitamin B12 1000 mcg tablet to every other day. - Check vitamin B12 plus methylmalonic acid at follow-up visit in 1 year.  4.  Other history - PMH includes CKD stage III, hypertension, GERD, iron deficiency anemia, hyperlipidemia, and type 2 diabetes mellitus   PLAN SUMMARY & DISPOSITION: Diagnostic mammogram 06/19/2022 Bone density scan in February 2024 Screening mammogram in July 2024 Labs in July 2024 Office visit after mammogram/labs in July 2024  All questions were answered. The patient knows to call the clinic with any problems, questions or concerns.  Medical decision making: Moderate  Time spent on visit: I spent 20 minutes counseling the patient face to face. The total time spent in the appointment was 30 minutes and more than 50% was on counseling.   Harriett Rush, Sims  06/05/2022 9:32 AM

## 2022-06-05 ENCOUNTER — Encounter (HOSPITAL_COMMUNITY): Payer: Self-pay | Admitting: Physician Assistant

## 2022-06-05 ENCOUNTER — Inpatient Hospital Stay (HOSPITAL_BASED_OUTPATIENT_CLINIC_OR_DEPARTMENT_OTHER): Payer: Medicare PPO | Admitting: Physician Assistant

## 2022-06-05 VITALS — BP 145/53 | HR 88 | Temp 98.7°F | Resp 16 | Wt 176.0 lb

## 2022-06-05 DIAGNOSIS — E538 Deficiency of other specified B group vitamins: Secondary | ICD-10-CM | POA: Diagnosis not present

## 2022-06-05 DIAGNOSIS — C50312 Malignant neoplasm of lower-inner quadrant of left female breast: Secondary | ICD-10-CM | POA: Diagnosis not present

## 2022-06-05 DIAGNOSIS — Z170 Estrogen receptor positive status [ER+]: Secondary | ICD-10-CM

## 2022-06-05 DIAGNOSIS — Z79811 Long term (current) use of aromatase inhibitors: Secondary | ICD-10-CM | POA: Diagnosis not present

## 2022-06-05 DIAGNOSIS — Z853 Personal history of malignant neoplasm of breast: Secondary | ICD-10-CM | POA: Diagnosis not present

## 2022-06-05 NOTE — Patient Instructions (Signed)
[place] at [facility] Discharge Instructions  You were seen today by Tarri Abernethy PA-C for your history of left-sided breast cancer.  Your most recent mammogram showed some possible abnormalities in your right breast.  You are scheduled for diagnostic mammogram and ultrasound of your right breast on 06/19/2022.   As long as your diagnostic mammogram in August is normal, we will plan on seeing you for repeat labs, screening mammogram, and an office visit in 1 year. You are due for your bone density scan in February 2024.  We will schedule this today. Continue taking anastrozole daily for your breast cancer. Continue to take calcium supplement and vitamin D 1000 units daily. Take vitamin B12 1000 mcg every other day  FOLLOW-UP APPOINTMENT: Office visit in 1 year, or sooner if needed.   - - - - - - - - - - - - - - - - - -    Thank you for choosing [place] at [facility] to provide your oncology and hematology care.  To afford each patient quality time with our provider, please arrive at least 15 minutes before your scheduled appointment time.   If you have a lab appointment with the [place] please come in thru the Main Entrance and check in at the main information desk.  You need to re-schedule your appointment should you arrive 10 or more minutes late.  We strive to give you quality time with our providers, and arriving late affects you and other patients whose appointments are after yours.  Also, if you no show three or more times for appointments you may be dismissed from the clinic at the providers discretion.     Again, thank you for choosing [facility].  Our hope is that these requests will decrease the amount of time that you wait before being seen by our physicians.       _____________________________________________________________  Should you have questions after your visit to [facility], please  contact our office at [phone] and follow the prompts.  Our office hours are 8:00 a.m. and 4:30 p.m. Monday - Friday.  Please note that voicemails left after 4:00 p.m. may not be returned until the following business day.  We are closed weekends and major holidays.  You do have access to a nurse 24-7, just call the main number to the clinic [phone] and do not press any options, hold on the line and a nurse will answer the phone.    For prescription refill requests, have your pharmacy contact our office and allow 72 hours.    Due to Covid, you will need to wear a mask upon entering the hospital. If you do not have a mask, a mask will be given to you at the Main Entrance upon arrival. For doctor visits, patients may have 1 support person age 77 or older with them. For treatment visits, patients can not have anyone with them due to social distancing guidelines and our immunocompromised population.

## 2022-06-19 ENCOUNTER — Ambulatory Visit (HOSPITAL_COMMUNITY)
Admission: RE | Admit: 2022-06-19 | Discharge: 2022-06-19 | Disposition: A | Discharge status: Home / Self Care | Payer: Medicare PPO | Source: Ambulatory Visit | Attending: Hematology | Admitting: Hematology

## 2022-06-19 DIAGNOSIS — R928 Other abnormal and inconclusive findings on diagnostic imaging of breast: Secondary | ICD-10-CM | POA: Insufficient documentation

## 2022-06-19 DIAGNOSIS — N6489 Other specified disorders of breast: Secondary | ICD-10-CM | POA: Insufficient documentation

## 2022-06-22 ENCOUNTER — Other Ambulatory Visit: Payer: Self-pay | Admitting: Hematology

## 2022-06-22 ENCOUNTER — Other Ambulatory Visit (HOSPITAL_COMMUNITY): Payer: Self-pay | Admitting: Hematology

## 2022-06-22 DIAGNOSIS — R928 Other abnormal and inconclusive findings on diagnostic imaging of breast: Secondary | ICD-10-CM

## 2022-06-29 ENCOUNTER — Ambulatory Visit
Admission: RE | Admit: 2022-06-29 | Discharge: 2022-06-29 | Disposition: A | Discharge status: Home / Self Care | Payer: Medicare PPO | Source: Ambulatory Visit | Attending: Hematology | Admitting: Hematology

## 2022-06-29 DIAGNOSIS — R928 Other abnormal and inconclusive findings on diagnostic imaging of breast: Secondary | ICD-10-CM

## 2022-07-09 ENCOUNTER — Encounter: Payer: Self-pay | Admitting: General Surgery

## 2022-07-09 ENCOUNTER — Other Ambulatory Visit (HOSPITAL_COMMUNITY): Payer: Self-pay | Admitting: General Surgery

## 2022-07-09 ENCOUNTER — Ambulatory Visit: Payer: Medicare PPO | Admitting: General Surgery

## 2022-07-09 VITALS — BP 111/66 | HR 98 | Temp 98.1°F | Resp 16 | Ht 65.0 in | Wt 175.0 lb

## 2022-07-09 DIAGNOSIS — D0511 Intraductal carcinoma in situ of right breast: Secondary | ICD-10-CM

## 2022-07-09 DIAGNOSIS — Z170 Estrogen receptor positive status [ER+]: Secondary | ICD-10-CM

## 2022-07-09 NOTE — Progress Notes (Deleted)
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   History of kidney stones    History of radiation therapy 09-10-2015 to [phone]   left breast 50Gy   Iron deficiency anemia    Malignant neoplasm of lower-inner quadrant of left breast in female, estrogen receptor positive [facility]) oncologist-  dr Mathis Dad higgs ([facility])   dx 08-16-2015--  Stage 1A invasisive (T1c,N0,M0), ER/PR+, HER2 negative---- 06-26-2015 left partial mastectomy , 07-31-2015 left axilla node dissection x1 (negative)-- completed radiation 10-08-2015 and started arimidex 10-16-2015   Mixed hyperlipidemia    Nephrolithiasis    bilateral per CT 04-28-2018   Type 2 diabetes mellitus ([place])    followed by pcp   Ureteral calculi    left   Urgency of urination    Wears glasses     Past Surgical History:  Procedure Laterality Date   ABDOMINAL HYSTERECTOMY  2005   AXILLARY SENTINEL NODE BIOPSY Left 07/31/2015   Procedure: SENTINEL LYMPH NODE BIOPSY, LEFT AXILLA, ;  Surgeon: Aviva Signs, MD;  [facility]: [facility];  Service: General;  Laterality: Left;  Sentinel Node @ 1000   BREAST BIOPSY Left 06/09/2016   Procedure: LEFT BREAST BIOPSY AFTER NEEDLE LOCALIZATION;  Surgeon: Aviva Signs, MD;  Location: [facility];  Service: General;  Laterality: Left;   CARPAL TUNNEL RELEASE Left 11/02/2014   Procedure: CARPAL TUNNEL RELEASE;  Surgeon: Charlotte Crumb, MD;  Location: [place];  Service: Orthopedics;  Laterality: Left;   CATARACT EXTRACTION W/ INTRAOCULAR LENS  IMPLANT, BILATERAL  2007   COLONOSCOPY N/A 02/03/2017   Procedure:  COLONOSCOPY;  Surgeon: Danie Binder, MD;  [facility];  Service: Endoscopy;  Laterality: N/A;  1245   COLONOSCOPY N/A 03/01/2020   Procedure: COLONOSCOPY;  Surgeon: Danie Binder, MD;  [facility];  Service: Endoscopy;  Laterality: N/A;  10:30am   COLONOSCOPY W/ BIOPSIES AND POLYPECTOMY     CYSTOSCOPY WITH RETROGRADE PYELOGRAM, URETEROSCOPY AND STENT PLACEMENT Left 05/09/2018   Procedure: CYSTOSCOPY WITH RETROGRADE PYELOGRAM, URETEROSCOPY AND STENT PLACEMENT;  Surgeon: Cleon Gustin, MD;  Location: [facility];  Service: Urology;  Laterality: Left;   CYSTOSCOPY/URETEROSCOPY/HOLMIUM LASER/STENT PLACEMENT Bilateral 03/18/2017   Procedure: CYSTOSCOPY/ BILATERAL RETROGRADE/RIGHT URETEROSCOPY/ RIGHT HOLMIUM LASER APPLICATION/RIGHT URETERAL STENT PLACEMENT;  Surgeon: Irine Seal, MD;  Location: [facility];  Service: Urology;  Laterality: Bilateral;   EXTRACORPOREAL SHOCK WAVE LITHOTRIPSY Right 12/14/2019   Procedure: EXTRACORPOREAL SHOCK WAVE LITHOTRIPSY (ESWL);  Surgeon: Festus Aloe, MD;  Location: [facility];  Service: Urology;  Laterality: Right;   EXTRACORPOREAL SHOCK WAVE LITHOTRIPSY Left 02/01/2020   Procedure: EXTRACORPOREAL SHOCK WAVE LITHOTRIPSY (ESWL);  Surgeon: Alexis Frock, MD;  Location: [facility];  Service: Urology;  Laterality: Left;   HOLMIUM LASER APPLICATION Left 7/98/9211   Procedure: HOLMIUM LASER APPLICATION;  Surgeon: Cleon Gustin, MD;  Location: [facility];  Service: Urology;  Laterality: Left;   KNEE ARTHROSCOPY WITH LATERAL MENISECTOMY Right 10/18/2020   Procedure: KNEE ARTHROSCOPY WITH LATERAL  MENISECTOMY;  Surgeon: Carole Civil, MD;  [facility];  Service: Orthopedics;  Laterality: Right;   OPEN REDUCTION INTERNAL FIXATION (ORIF) DISTAL RADIAL FRACTURE Left 11/02/2014   Procedure: OPEN REDUCTION INTERNAL FIXATION (ORIF) DISTAL RADIAL FRACTURE;  Surgeon: Charlotte Crumb, MD;  Location: [place];  Service: Orthopedics;  Laterality: Left;   PARTIAL MASTECTOMY WITH NEEDLE LOCALIZATION Left 06/26/2015   Procedure: PARTIAL MASTECTOMY WITH NEEDLE LOCALIZATION;  Surgeon: Aviva Signs, MD;  [facility];  Service: General;  Laterality: Left;  Needle Loc @ 8:00am   POLYPECTOMY  02/03/2017   Procedure: POLYPECTOMY;  Surgeon: Danie Binder, MD;  [facility];  Service: Endoscopy;;  descending and hepatic flexure   POLYPECTOMY  03/01/2020   Procedure: POLYPECTOMY;  Surgeon: Danie Binder, MD;  [facility];  Service: Endoscopy;;  ascending;splenic flexure polyps x2;rectal   TARSAL METATARSAL ARTHRODESIS Left 09-07-2007   dr Beola Cord  [facility]   first and second tarsal metatarsal fusion, gastroc slide, neurolysis   TOTAL ABDOMINAL HYSTERECTOMY W/ BILATERAL SALPINGOOPHORECTOMY  10-21-2004   dr soper  [facility]    Family History  Problem Relation Age of Onset   Hypertension Mother    Osteoporosis Mother    Diabetes Sister        x3   Colon cancer Neg Hx     Current Outpatient Medications on File Prior to Visit  Medication Sig Dispense Refill   acetaminophen (TYLENOL) 500 MG tablet Take 500 mg by mouth every 6 (six) hours as needed for moderate pain.     amLODipine-olmesartan (AZOR) 5-40 MG tablet Take 1 tablet by mouth daily.     anastrozole (ARIMIDEX) 1 MG tablet Take 1 tablet (1 mg total) by mouth daily. 90 tablet 3   Blood Glucose Calibration (ACCU-CHEK AVIVA) SOLN      Blood Glucose Monitoring Suppl (ACCU-CHEK AVIVA PLUS) w/Device KIT      calcium carbonate (TUMS EX) 750 MG chewable tablet Chew 750 mg by mouth daily.      Cholecalciferol (VITAMIN D3) 1000 units CAPS Take 1,000 Units by mouth daily.      Cyanocobalamin (VITAMIN B-12 PO) Take by mouth.     diclofenac Sodium (VOLTAREN) 1 % GEL Apply 1 application topically 4 (four) times daily as needed (knee pain).     fenofibrate 54 MG tablet Take 1 tablet by mouth daily.     GEMTESA 75 MG  TABS Take 1 tablet by mouth daily.     glucose blood test strip 1 each 2 (two) times daily. Check Blood sugar 1-2 times daily     ibuprofen (ADVIL) 400 MG tablet Take 1 tablet (400 mg total) by mouth every 8 (eight) hours as needed. 90 tablet 0   MAGNESIUM CITRATE PO Take by mouth.     metFORMIN (GLUCOPHAGE) 1000 MG tablet Take 1,000 mg by mouth 2 (two) times daily with a meal.      Multiple Vitamin (MULTIVITAMIN WITH MINERALS) TABS tablet Take 1 tablet by mouth every morning. Women's One A Day 50+     Omega-3 Fatty Acids (FISH OIL) 1000 MG CAPS Take 1,000 mg by mouth daily.     omeprazole (PRILOSEC) 10 MG capsule Take 10 mg by mouth daily.     simvastatin (ZOCOR) 40 MG tablet Take 40 mg by mouth at bedtime.      No current facility-administered medications on file prior to visit.    No Known Allergies  Social History   Substance and Sexual Activity  Alcohol Use No    Social History   Tobacco Use  Smoking Status Former   Packs/day: 1.00   Years: 20.00   Total pack years: 20.00   Types: Cigarettes   Quit date: 06/05/1986   Years since quitting: 36.1  Smokeless Tobacco Never    ROS  Objective   Vitals:   07/09/22 0940  BP: 111/66  Pulse: 98  Resp: 16  Temp: 98.1 F (36.7 C)  SpO2: 92%    Physical Exam  Assessment   Plan

## 2022-07-09 NOTE — Progress Notes (Addendum)
[name]; [id number]; 06/15/1946   HPI Patient is a 76-year-old white female who was referred to my care by Dr. Katragadda of oncology and Samantha Eksir for evaluation and treatment of a new right breast cancer.  Patient recently underwent screening mammography was found to have a suspicious lesion in the upper, inner quadrant of the right breast.  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  It is ER positive, PR negative.  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  She is currently on Aridimex.  She denies feeling a lump. 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   History of kidney stones    History of radiation therapy 09-10-2015 to 11-122-2016   left breast 50Gy   Iron deficiency anemia    Malignant neoplasm of lower-inner quadrant of left breast in female, estrogen receptor positive (HCC) oncologist-  dr vetta higgs ([facility])   dx 08-16-2015--  Stage 1A invasisive (T1c,N0,M0), ER/PR+, HER2 negative---- 06-26-2015 left partial mastectomy , 07-31-2015 left axilla node dissection x1 (negative)-- completed radiation 10-08-2015 and started arimidex 10-16-2015   Mixed hyperlipidemia    Nephrolithiasis    bilateral per CT 04-28-2018   Type 2 diabetes mellitus (HCC)    followed by pcp   Ureteral calculi    left   Urgency of urination    Wears glasses     Past Surgical History:  Procedure Laterality Date   ABDOMINAL HYSTERECTOMY  2005   AXILLARY SENTINEL NODE BIOPSY Left  07/31/2015   Procedure: SENTINEL LYMPH NODE BIOPSY, LEFT AXILLA, ;  Surgeon: Jaylise Peek, MD;  [facility]: [facility];  Service: General;  Laterality: Left;  Sentinel Node @ 1000   BREAST BIOPSY Left 06/09/2016   Procedure: LEFT BREAST BIOPSY AFTER NEEDLE LOCALIZATION;  Surgeon: Abdifatah Colquhoun, MD;  Location: [facility];  Service: General;  Laterality: Left;   CARPAL TUNNEL RELEASE Left 11/02/2014   Procedure: CARPAL TUNNEL RELEASE;  Surgeon: Matthew Weingold, MD;  [facility];  Service: Orthopedics;  Laterality: Left;   CATARACT EXTRACTION W/ INTRAOCULAR LENS  IMPLANT, BILATERAL  2007   COLONOSCOPY N/A 02/03/2017   Procedure: COLONOSCOPY;  Surgeon: Sandi L Fields, MD;  [facility];  Service: Endoscopy;  Laterality: N/A;  1245   COLONOSCOPY N/A 03/01/2020   Procedure: COLONOSCOPY;  Surgeon: Fields, Sandi L, MD;  [facility];  Service: Endoscopy;  Laterality: N/A;  10:30am   COLONOSCOPY W/ BIOPSIES AND POLYPECTOMY     CYSTOSCOPY WITH RETROGRADE PYELOGRAM, URETEROSCOPY AND STENT PLACEMENT Left 05/09/2018   Procedure: CYSTOSCOPY WITH RETROGRADE PYELOGRAM, URETEROSCOPY AND STENT PLACEMENT;  Surgeon: McKenzie, Patrick L, MD;  Location: [facility];  Service: Urology;  Laterality: Left;   CYSTOSCOPY/URETEROSCOPY/HOLMIUM LASER/STENT PLACEMENT Bilateral 03/18/2017   Procedure: CYSTOSCOPY/ BILATERAL RETROGRADE/RIGHT URETEROSCOPY/ RIGHT HOLMIUM LASER APPLICATION/RIGHT URETERAL STENT PLACEMENT;  Surgeon: John Wrenn, MD;  Location[facility];  Service: Urology;  Laterality: Bilateral;   EXTRACORPOREAL SHOCK   WAVE LITHOTRIPSY Right 12/14/2019   Procedure: EXTRACORPOREAL SHOCK WAVE LITHOTRIPSY (ESWL);  Surgeon: Eskridge, Matthew, MD;  Location: [facility];  Service: Urology;  Laterality: Right;   EXTRACORPOREAL SHOCK WAVE LITHOTRIPSY Left 02/01/2020   Procedure: EXTRACORPOREAL SHOCK WAVE LITHOTRIPSY (ESWL);  Surgeon: Manny, Theodore, MD;  Location: [facility];   Service: Urology;  Laterality: Left;   HOLMIUM LASER APPLICATION Left 05/09/2018   Procedure: HOLMIUM LASER APPLICATION;  Surgeon: McKenzie, Patrick L, MD;  Location:  [facility];  Service: Urology;  Laterality: Left;   KNEE ARTHROSCOPY WITH LATERAL MENISECTOMY Right 10/18/2020   Procedure: KNEE ARTHROSCOPY WITH LATERAL MENISECTOMY;  Surgeon: Harrison, Stanley E, MD;  [facility];  Service: Orthopedics;  Laterality: Right;   OPEN REDUCTION INTERNAL FIXATION (ORIF) DISTAL RADIAL FRACTURE Left 11/02/2014   Procedure: OPEN REDUCTION INTERNAL FIXATION (ORIF) DISTAL RADIAL FRACTURE;  Surgeon: Matthew Weingold, MD;  [facility];  Service: Orthopedics;  Laterality: Left;   PARTIAL MASTECTOMY WITH NEEDLE LOCALIZATION Left 06/26/2015   Procedure: PARTIAL MASTECTOMY WITH NEEDLE LOCALIZATION;  Surgeon: Jessamine Barcia, MD;  [facility];  Service: General;  Laterality: Left;  Needle Loc @ 8:00am   POLYPECTOMY  02/03/2017   Procedure: POLYPECTOMY;  Surgeon: Sandi L Fields, MD;  [facility];  Service: Endoscopy;;  descending and hepatic flexure   POLYPECTOMY  03/01/2020   Procedure: POLYPECTOMY;  Surgeon: Fields, Sandi L, MD;  [facility];  Service: Endoscopy;;  ascending;splenic flexure polyps x2;rectal   TARSAL METATARSAL ARTHRODESIS Left 09-07-2007   dr bednarz  [facility]   first and second tarsal metatarsal fusion, gastroc slide, neurolysis   TOTAL ABDOMINAL HYSTERECTOMY W/ BILATERAL SALPINGOOPHORECTOMY  10-21-2004   dr soper  [facility]    Family History  Problem Relation Age of Onset   Hypertension Mother    Osteoporosis Mother    Diabetes Sister        x3   Colon cancer Neg Hx     Current Outpatient Medications on File Prior to Visit  Medication Sig Dispense Refill   acetaminophen (TYLENOL) 500 MG tablet Take 500 mg by mouth every 6 (six) hours as needed for moderate pain.     amLODipine-olmesartan (AZOR) 5-40 MG tablet Take 1 tablet by mouth daily.      anastrozole (ARIMIDEX) 1 MG tablet Take 1 tablet (1 mg total) by mouth daily. 90 tablet 3   Blood Glucose Calibration (ACCU-CHEK AVIVA) SOLN      Blood Glucose Monitoring Suppl (ACCU-CHEK AVIVA PLUS) w/Device KIT      calcium carbonate (TUMS EX) 750 MG chewable tablet Chew 750 mg by mouth daily.      Cholecalciferol (VITAMIN D3) 1000 units CAPS Take 1,000 Units by mouth daily.      Cyanocobalamin (VITAMIN B-12 PO) Take by mouth.     diclofenac Sodium (VOLTAREN) 1 % GEL Apply 1 application topically 4 (four) times daily as needed (knee pain).     fenofibrate 54 MG tablet Take 1 tablet by mouth daily.     GEMTESA 75 MG TABS Take 1 tablet by mouth daily.     glucose blood test strip 1 each 2 (two) times daily. Check Blood sugar 1-2 times daily     ibuprofen (ADVIL) 400 MG tablet Take 1 tablet (400 mg total) by mouth every 8 (eight) hours as needed. 90 tablet 0   MAGNESIUM CITRATE PO Take by mouth.     metFORMIN (GLUCOPHAGE) 1000 MG tablet Take 1,000 mg by mouth   2 (two) times daily with a meal.      Multiple Vitamin (MULTIVITAMIN WITH MINERALS) TABS tablet Take 1 tablet by mouth every morning. Women's One A Day 50+     Omega-3 Fatty Acids (FISH OIL) 1000 MG CAPS Take 1,000 mg by mouth daily.     omeprazole (PRILOSEC) 10 MG capsule Take 10 mg by mouth daily.     simvastatin (ZOCOR) 40 MG tablet Take 40 mg by mouth at bedtime.      No current facility-administered medications on file prior to visit.    No Known Allergies  Social History   Substance and Sexual Activity  Alcohol Use No    Social History   Tobacco Use  Smoking Status Former   Packs/day: 1.00   Years: 20.00   Total pack years: 20.00   Types: Cigarettes   Quit date: 06/05/1986   Years since quitting: 36.1  Smokeless Tobacco Never    Review of Systems  Constitutional: Negative.   HENT: Negative.    Eyes: Negative.   Respiratory: Negative.    Cardiovascular: Negative.   Gastrointestinal: Negative.   Genitourinary:   Positive for frequency and urgency.  Musculoskeletal:  Positive for back pain and joint pain.  Skin: Negative.   Neurological: Negative.   Endo/Heme/Allergies: Negative.   Psychiatric/Behavioral: Negative.      Objective   Vitals:   07/09/22 0940  BP: 111/66  Pulse: 98  Resp: 16  Temp: 98.1 F (36.7 C)  SpO2: 92%    Physical Exam Vitals reviewed. Exam conducted with a chaperone present.  Constitutional:      Appearance: Normal appearance. She is not ill-appearing.  HENT:     Head: Normocephalic and atraumatic.  Cardiovascular:     Rate and Rhythm: Normal rate and regular rhythm.     Heart sounds: Normal heart sounds. No murmur heard.    No friction rub. No gallop.  Pulmonary:     Effort: Pulmonary effort is normal. No respiratory distress.     Breath sounds: Normal breath sounds. No stridor. No wheezing, rhonchi or rales.  Musculoskeletal:     Cervical back: Neck supple.  Lymphadenopathy:     Cervical: No cervical adenopathy.  Skin:    General: Skin is warm and dry.  Neurological:     Mental Status: She is alert and oriented to person, place, and time.   Breast: Ecchymosis with bruising noted in the upper, inner quadrant of the right breast.  No nipple discharge is noted.  The axilla is negative for palpable nodes.  Left breast examination has no dominant mass or nipple discharge.  There is some left nipple inversion.  The axilla is negative for palpable nodes.  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  The risks and benefits of the procedure including bleeding, infection, and unclear margins were fully explained to the patient, who gave informed consent. 

## 2022-07-14 ENCOUNTER — Ambulatory Visit (HOSPITAL_COMMUNITY)
Admission: RE | Admit: 2022-07-14 | Discharge: 2022-07-14 | Disposition: A | Discharge status: Home / Self Care | Payer: Medicare PPO | Source: Ambulatory Visit | Attending: General Surgery | Admitting: General Surgery

## 2022-07-14 DIAGNOSIS — D0511 Intraductal carcinoma in situ of right breast: Secondary | ICD-10-CM | POA: Diagnosis present

## 2022-07-14 MED ORDER — LIDOCAINE HCL (PF) 2 % IJ SOLN
INTRAMUSCULAR | Status: AC
  Filled 2022-07-14: qty 10

## 2022-07-14 NOTE — H&P (Signed)
[name]; [id number]; Jul 20, 1946   HPI Patient is a 76 year old white female who was referred to my care by Dr. Delton Coombes of oncology and Terrill Mohr for evaluation and treatment of a new right breast cancer.  Patient recently underwent screening mammography was found to have a suspicious lesion in the upper, inner quadrant of the right breast.  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  It is ER positive, PR negative.  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  She is currently on Aridimex.  She denies feeling a lump. 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   History of kidney stones    History of radiation therapy 09-10-2015 to [phone]   left breast 50Gy   Iron deficiency anemia    Malignant neoplasm of lower-inner quadrant of left breast in female, estrogen receptor positive [facility]) oncologist-  dr Mathis Dad higgs ([facility])   dx 08-16-2015--  Stage 1A invasisive (T1c,N0,M0), ER/PR+, HER2 negative---- 06-26-2015 left partial mastectomy , 07-31-2015 left axilla node dissection x1 (negative)-- completed radiation 10-08-2015 and started arimidex 10-16-2015   Mixed hyperlipidemia    Nephrolithiasis    bilateral per CT 04-28-2018   Type 2 diabetes mellitus ([place])    followed by pcp   Ureteral calculi    left   Urgency of urination    Wears glasses     Past Surgical History:  Procedure Laterality Date   ABDOMINAL HYSTERECTOMY  2005   AXILLARY SENTINEL NODE BIOPSY Left  07/31/2015   Procedure: SENTINEL LYMPH NODE BIOPSY, LEFT AXILLA, ;  Surgeon: Aviva Signs, MD;  Location: [facility];  Service: General;  Laterality: Left;  Sentinel Node @ 1000   BREAST BIOPSY Left 06/09/2016   Procedure: LEFT BREAST BIOPSY AFTER NEEDLE LOCALIZATION;  Surgeon: Aviva Signs, MD;  Location: [facility];  Service: General;  Laterality: Left;   CARPAL TUNNEL RELEASE Left 11/02/2014   Procedure: CARPAL TUNNEL RELEASE;  Surgeon: Charlotte Crumb, MD;  Location: [place];  Service: Orthopedics;  Laterality: Left;   CATARACT EXTRACTION W/ INTRAOCULAR LENS  IMPLANT, BILATERAL  2007   COLONOSCOPY N/A 02/03/2017   Procedure: COLONOSCOPY;  Surgeon: Danie Binder, MD;  [facility]: [facility];  Service: Endoscopy;  Laterality: N/A;  1245   COLONOSCOPY N/A 03/01/2020   Procedure: COLONOSCOPY;  Surgeon: Danie Binder, MD;  Location: [facility];  Service: Endoscopy;  Laterality: N/A;  10:30am   COLONOSCOPY W/ BIOPSIES AND POLYPECTOMY     CYSTOSCOPY WITH RETROGRADE PYELOGRAM, URETEROSCOPY AND STENT PLACEMENT Left 05/09/2018   Procedure: CYSTOSCOPY WITH RETROGRADE PYELOGRAM, URETEROSCOPY AND STENT PLACEMENT;  Surgeon: Cleon Gustin, MD;  Location: [facility];  Service: Urology;  Laterality: Left;   CYSTOSCOPY/URETEROSCOPY/HOLMIUM LASER/STENT PLACEMENT Bilateral 03/18/2017   Procedure: CYSTOSCOPY/ BILATERAL RETROGRADE/RIGHT URETEROSCOPY/ RIGHT HOLMIUM LASER APPLICATION/RIGHT URETERAL STENT PLACEMENT;  Surgeon: Irine Seal, MD;  Location: [facility];  Service: Urology;  Laterality: Bilateral;   EXTRACORPOREAL SHOCK  WAVE LITHOTRIPSY Right 12/14/2019   Procedure: EXTRACORPOREAL SHOCK WAVE LITHOTRIPSY (ESWL);  Surgeon: Festus Aloe, MD;  Location: [facility];  Service: Urology;  Laterality: Right;   EXTRACORPOREAL SHOCK WAVE LITHOTRIPSY Left 02/01/2020   Procedure: EXTRACORPOREAL SHOCK WAVE LITHOTRIPSY (ESWL);  Surgeon: Alexis Frock, MD;  Location: [facility];   Service: Urology;  Laterality: Left;   HOLMIUM LASER APPLICATION Left 8/84/1660   Procedure: HOLMIUM LASER APPLICATION;  Surgeon: Cleon Gustin, MD;  Location: [facility];  Service: Urology;  Laterality: Left;   KNEE ARTHROSCOPY WITH LATERAL MENISECTOMY Right 10/18/2020   Procedure: KNEE ARTHROSCOPY WITH LATERAL MENISECTOMY;  Surgeon: Carole Civil, MD;  Location: [facility];  Service: Orthopedics;  Laterality: Right;   OPEN REDUCTION INTERNAL FIXATION (ORIF) DISTAL RADIAL FRACTURE Left 11/02/2014   Procedure: OPEN REDUCTION INTERNAL FIXATION (ORIF) DISTAL RADIAL FRACTURE;  Surgeon: Charlotte Crumb, MD;  Location: [place];  Service: Orthopedics;  Laterality: Left;   PARTIAL MASTECTOMY WITH NEEDLE LOCALIZATION Left 06/26/2015   Procedure: PARTIAL MASTECTOMY WITH NEEDLE LOCALIZATION;  Surgeon: Aviva Signs, MD;  Location: [facility];  Service: General;  Laterality: Left;  Needle Loc @ 8:00am   POLYPECTOMY  02/03/2017   Procedure: POLYPECTOMY;  Surgeon: Danie Binder, MD;  [facility];  Service: Endoscopy;;  descending and hepatic flexure   POLYPECTOMY  03/01/2020   Procedure: POLYPECTOMY;  Surgeon: Danie Binder, MD;  [facility];  Service: Endoscopy;;  ascending;splenic flexure polyps x2;rectal   TARSAL METATARSAL ARTHRODESIS Left 09-07-2007   dr Beola Cord  [facility]   first and second tarsal metatarsal fusion, gastroc slide, neurolysis   TOTAL ABDOMINAL HYSTERECTOMY W/ BILATERAL SALPINGOOPHORECTOMY  10-21-2004   dr soper  [facility]    Family History  Problem Relation Age of Onset   Hypertension Mother    Osteoporosis Mother    Diabetes Sister        x3   Colon cancer Neg Hx     Current Outpatient Medications on File Prior to Visit  Medication Sig Dispense Refill   acetaminophen (TYLENOL) 500 MG tablet Take 500 mg by mouth every 6 (six) hours as needed for moderate pain.     amLODipine-olmesartan (AZOR) 5-40 MG tablet Take 1 tablet by mouth daily.      anastrozole (ARIMIDEX) 1 MG tablet Take 1 tablet (1 mg total) by mouth daily. 90 tablet 3   Blood Glucose Calibration (ACCU-CHEK AVIVA) SOLN      Blood Glucose Monitoring Suppl (ACCU-CHEK AVIVA PLUS) w/Device KIT      calcium carbonate (TUMS EX) 750 MG chewable tablet Chew 750 mg by mouth daily.      Cholecalciferol (VITAMIN D3) 1000 units CAPS Take 1,000 Units by mouth daily.      Cyanocobalamin (VITAMIN B-12 PO) Take by mouth.     diclofenac Sodium (VOLTAREN) 1 % GEL Apply 1 application topically 4 (four) times daily as needed (knee pain).     fenofibrate 54 MG tablet Take 1 tablet by mouth daily.     GEMTESA 75 MG TABS Take 1 tablet by mouth daily.     glucose blood test strip 1 each 2 (two) times daily. Check Blood sugar 1-2 times daily     ibuprofen (ADVIL) 400 MG tablet Take 1 tablet (400 mg total) by mouth every 8 (eight) hours as needed. 90 tablet 0   MAGNESIUM CITRATE PO Take by mouth.     metFORMIN (GLUCOPHAGE) 1000 MG tablet Take 1,000 mg by mouth  2 (two) times daily with a meal.      Multiple Vitamin (MULTIVITAMIN WITH MINERALS) TABS tablet Take 1 tablet by mouth every morning. Women's One A Day 50+     Omega-3 Fatty Acids (FISH OIL) 1000 MG CAPS Take 1,000 mg by mouth daily.     omeprazole (PRILOSEC) 10 MG capsule Take 10 mg by mouth daily.     simvastatin (ZOCOR) 40 MG tablet Take 40 mg by mouth at bedtime.      No current facility-administered medications on file prior to visit.    No Known Allergies  Social History   Substance and Sexual Activity  Alcohol Use No    Social History   Tobacco Use  Smoking Status Former   Packs/day: 1.00   Years: 20.00   Total pack years: 20.00   Types: Cigarettes   Quit date: 06/05/1986   Years since quitting: 36.1  Smokeless Tobacco Never    Review of Systems  Constitutional: Negative.   HENT: Negative.    Eyes: Negative.   Respiratory: Negative.    Cardiovascular: Negative.   Gastrointestinal: Negative.   Genitourinary:   Positive for frequency and urgency.  Musculoskeletal:  Positive for back pain and joint pain.  Skin: Negative.   Neurological: Negative.   Endo/Heme/Allergies: Negative.   Psychiatric/Behavioral: Negative.      Objective   Vitals:   07/09/22 0940  BP: 111/66  Pulse: 98  Resp: 16  Temp: 98.1 F (36.7 C)  SpO2: 92%    Physical Exam Vitals reviewed. Exam conducted with a chaperone present.  Constitutional:      Appearance: Normal appearance. She is not ill-appearing.  HENT:     Head: Normocephalic and atraumatic.  Cardiovascular:     Rate and Rhythm: Normal rate and regular rhythm.     Heart sounds: Normal heart sounds. No murmur heard.    No friction rub. No gallop.  Pulmonary:     Effort: Pulmonary effort is normal. No respiratory distress.     Breath sounds: Normal breath sounds. No stridor. No wheezing, rhonchi or rales.  Musculoskeletal:     Cervical back: Neck supple.  Lymphadenopathy:     Cervical: No cervical adenopathy.  Skin:    General: Skin is warm and dry.  Neurological:     Mental Status: She is alert and oriented to person, place, and time.   Breast: Ecchymosis with bruising noted in the upper, inner quadrant of the right breast.  No nipple discharge is noted.  The axilla is negative for palpable nodes.  Left breast examination has no dominant mass or nipple discharge.  There is some left nipple inversion.  The axilla is negative for palpable nodes.  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  The risks and benefits of the procedure including bleeding, infection, and unclear margins were fully explained to the patient, who gave informed consent.

## 2022-07-23 ENCOUNTER — Other Ambulatory Visit (HOSPITAL_COMMUNITY): Payer: Self-pay | Admitting: Nurse Practitioner

## 2022-07-23 DIAGNOSIS — Z170 Estrogen receptor positive status [ER+]: Secondary | ICD-10-CM

## 2022-07-28 ENCOUNTER — Other Ambulatory Visit: Payer: Self-pay | Admitting: *Deleted

## 2022-07-28 DIAGNOSIS — Z170 Estrogen receptor positive status [ER+]: Secondary | ICD-10-CM

## 2022-07-28 MED ORDER — ANASTROZOLE 1 MG PO TABS: 1.0000 mg | ORAL_TABLET | Freq: Every day | ORAL | 3 refills | Status: DC

## 2022-08-04 NOTE — Patient Instructions (Signed)
[name]  08/04/2022     '@PREFPERIOPPHARMACY'$ @   Your procedure is scheduled on  08/10/2022.   Report to [facility] at  0730  A.M.   Call this number if you have problems the morning of surgery:  [phone]   Remember:  Do not eat or drink after midnight.      Take these medicines the morning of surgery with A SIP OF WATER                                                 Azor, prilosec.     Do not wear jewelry, make-up or nail polish.  Do not wear lotions, powders, or perfumes, or deodorant.  Do not shave 48 hours prior to surgery.  Men may shave face and neck.  Do not bring valuables to the hospital.  [facility] is not responsible for any belongings or valuables.  Contacts, dentures or bridgework may not be worn into surgery.  Leave your suitcase in the car.  After surgery it may be brought to your room.  For patients admitted to the hospital, discharge time will be determined by your treatment team.  Patients discharged the day of surgery will not be allowed to drive home and must have someone with them for 24 hours.     Special instructions:   DO NOT smoke tobacco or vape for 24 hours before your procedure.  Please read over the following fact sheets that you were given. Coughing and Deep Breathing, Surgical Site Infection Prevention, Anesthesia Post-op Instructions, and Care and Recovery After Surgery      Sentinel Lymph Node Biopsy, Care After The following information offers guidance on how to care for yourself after your procedure. Your health care provider may also give you more specific instructions. If you have problems or questions, contact your health care provider. What can I expect after the procedure? After the procedure, it is common to have: Blue urine or stool for the next 24-48 hours. This is normal. It is caused by the dye used during the procedure. Blue skin at the injection site. This may last for up to 8 weeks. Numbness,  tingling, or pain near your incision site. Swelling or bruising near your incision. Follow these instructions at home: Incision care     Follow instructions from your health care provider about how to take care of your incision. Make sure you: Wash your hands with soap and water for at least 20 seconds before and after you change your bandage (dressing). If soap and water are not available, use hand sanitizer. Change your dressing as told by your health care provider. Leave stitches (sutures), skin glue, or adhesive strips in place. These skin closures may need to stay in place for 2 weeks or longer. If adhesive strip edges start to loosen and curl up, you may trim the loose edges. Do not remove adhesive strips completely unless your health care provider tells you to do that. Check your incision area every day for signs of infection. Check for: Redness, swelling, or more pain. Fluid or blood. Warmth. Pus or a bad smell. Do not take baths, swim, or use a hot tub until your health care provider approves. Ask your health care provider if you can take showers. You may be able to shower  24 hours after your procedure. After a shower, pat the incision area dry with a clean towel. Do not rub the incision. That could cause bleeding. Activity Avoid activities that take a lot of effort. If you were given a sedative during the procedure, it can affect you for several hours. Do not drive or operate machinery until your health care provider says that it is safe. Return to your normal activities as told by your health care provider. Ask your health care provider what activities are safe for you. General instructions Take over-the-counter and prescription medicines only as told by your health care provider. You may resume your regular diet. If the procedure was done on or near the lymph nodes under your arm (axillary lymph nodes), do not have your blood pressure taken or have blood drawn from the arm on the  side of the biopsy until your health care provider says it is okay. You may need to be screened for extra fluid around the lymph nodes (lymphedema). Follow instructions from your health care provider about how often you should be checked. Keep all follow-up visits. This is important. Contact a health care provider if: Your pain medicine is not helping. You have nausea and vomiting. You have redness, swelling, or more pain around your biopsy site. You have fluid, blood, pus, or a bad smell coming from your incision. Your incision feels warm to the touch, you have a fever, or chills. You have any new bruising. Get help right away if: You have pain that is getting worse, and your medicine is not helping. You have vomiting that will not stop. You have chest pain or trouble breathing. These symptoms may represent a serious problem that is an emergency. Do not wait to see if the symptoms will go away. Get medical help right away. Call your local emergency services (911 in the [place] Do not drive yourself to the hospital. Summary After the procedure, it is common to have blue urine or stool for the next 24-48 hours. Take over-the-counter and prescription medicines only as told by your health care provider. Follow instructions from your health care provider about how to take care of your incision. Check your incision area every day for signs of infection. Get help right away if you have chest pain or trouble breathing. This information is not intended to replace advice given to you by your health care provider. Make sure you discuss any questions you have with your health care provider. Document Revised: 08/15/2020 Document Reviewed: 08/15/2020 Elsevier Patient Education  [place] Anesthesia, Adult, Care After The following information offers guidance on how to care for yourself after your procedure. Your health care provider may also give you more specific instructions. If you  have problems or questions, contact your health care provider. What can I expect after the procedure? After the procedure, it is common for people to: Have pain or discomfort at the IV site. Have nausea or vomiting. Have a sore throat or hoarseness. Have trouble concentrating. Feel cold or chills. Feel weak, sleepy, or tired (fatigue). Have soreness and body aches. These can affect parts of the body that were not involved in surgery. Follow these instructions at home: For the time period you were told by your health care provider:  Rest. Do not participate in activities where you could fall or become injured. Do not drive or use machinery. Do not drink alcohol. Do not take sleeping pills or medicines that cause drowsiness. Do not make important decisions  or sign legal documents. Do not take care of children on your own. General instructions Drink enough fluid to keep your urine pale yellow. If you have sleep apnea, surgery and certain medicines can increase your risk for breathing problems. Follow instructions from your health care provider about wearing your sleep device: Anytime you are sleeping, including during daytime naps. While taking prescription pain medicines, sleeping medicines, or medicines that make you drowsy. Return to your normal activities as told by your health care provider. Ask your health care provider what activities are safe for you. Take over-the-counter and prescription medicines only as told by your health care provider. Do not use any products that contain nicotine or tobacco. These products include cigarettes, chewing tobacco, and vaping devices, such as e-cigarettes. These can delay incision healing after surgery. If you need help quitting, ask your health care provider. Contact a health care provider if: You have nausea or vomiting that does not get better with medicine. You vomit every time you eat or drink. You have pain that does not get better with  medicine. You cannot urinate or have bloody urine. You develop a skin rash. You have a fever. Get help right away if: You have trouble breathing. You have chest pain. You vomit blood. These symptoms may be an emergency. Get help right away. Call 911. Do not wait to see if the symptoms will go away. Do not drive yourself to the hospital. Summary After the procedure, it is common to have a sore throat, hoarseness, nausea, vomiting, or to feel weak, sleepy, or fatigue. For the time period you were told by your health care provider, do not drive or use machinery. Get help right away if you have difficulty breathing, have chest pain, or vomit blood. These symptoms may be an emergency. This information is not intended to replace advice given to you by your health care provider. Make sure you discuss any questions you have with your health care provider. Document Revised: 01/30/2022 Document Reviewed: 01/30/2022 Elsevier Patient Education  [place]. How to Use Chlorhexidine Before Surgery Chlorhexidine gluconate (CHG) is a germ-killing (antiseptic) solution that is used to clean the skin. It can get rid of the bacteria that normally live on the skin and can keep them away for about 24 hours. To clean your skin with CHG, you may be given: A CHG solution to use in the shower or as part of a sponge bath. A prepackaged cloth that contains CHG. Cleaning your skin with CHG may help lower the risk for infection: While you are staying in the intensive care unit of the hospital. If you have a vascular access, such as a central line, to provide short-term or long-term access to your veins. If you have a catheter to drain urine from your bladder. If you are on a ventilator. A ventilator is a machine that helps you breathe by moving air in and out of your lungs. After surgery. What are the risks? Risks of using CHG include: A skin reaction. Hearing loss, if CHG gets in your ears and you have a  perforated eardrum. Eye injury, if CHG gets in your eyes and is not rinsed out. The CHG product catching fire. Make sure that you avoid smoking and flames after applying CHG to your skin. Do not use CHG: If you have a chlorhexidine allergy or have previously reacted to chlorhexidine. On babies younger than 74 months of age. How to use CHG solution Use CHG only as told by your  health care provider, and follow the instructions on the label. Use the full amount of CHG as directed. Usually, this is one bottle. During a shower Follow these steps when using CHG solution during a shower (unless your health care provider gives you different instructions): Start the shower. Use your normal soap and shampoo to wash your face and hair. Turn off the shower or move out of the shower stream. Pour the CHG onto a clean washcloth. Do not use any type of brush or rough-edged sponge. Starting at your neck, lather your body down to your toes. Make sure you follow these instructions: If you will be having surgery, pay special attention to the part of your body where you will be having surgery. Scrub this area for at least 1 minute. Do not use CHG on your head or face. If the solution gets into your ears or eyes, rinse them well with water. Avoid your genital area. Avoid any areas of skin that have broken skin, cuts, or scrapes. Scrub your back and under your arms. Make sure to wash skin folds. Let the lather sit on your skin for 1-2 minutes or as long as told by your health care provider. Thoroughly rinse your entire body in the shower. Make sure that all body creases and crevices are rinsed well. Dry off with a clean towel. Do not put any substances on your body afterward--such as powder, lotion, or perfume--unless you are told to do so by your health care provider. Only use lotions that are recommended by the manufacturer. Put on clean clothes or pajamas. If it is the night before your surgery, sleep in clean  sheets.  During a sponge bath Follow these steps when using CHG solution during a sponge bath (unless your health care provider gives you different instructions): Use your normal soap and shampoo to wash your face and hair. Pour the CHG onto a clean washcloth. Starting at your neck, lather your body down to your toes. Make sure you follow these instructions: If you will be having surgery, pay special attention to the part of your body where you will be having surgery. Scrub this area for at least 1 minute. Do not use CHG on your head or face. If the solution gets into your ears or eyes, rinse them well with water. Avoid your genital area. Avoid any areas of skin that have broken skin, cuts, or scrapes. Scrub your back and under your arms. Make sure to wash skin folds. Let the lather sit on your skin for 1-2 minutes or as long as told by your health care provider. Using a different clean, wet washcloth, thoroughly rinse your entire body. Make sure that all body creases and crevices are rinsed well. Dry off with a clean towel. Do not put any substances on your body afterward--such as powder, lotion, or perfume--unless you are told to do so by your health care provider. Only use lotions that are recommended by the manufacturer. Put on clean clothes or pajamas. If it is the night before your surgery, sleep in clean sheets. How to use CHG prepackaged cloths Only use CHG cloths as told by your health care provider, and follow the instructions on the label. Use the CHG cloth on clean, dry skin. Do not use the CHG cloth on your head or face unless your health care provider tells you to. When washing with the CHG cloth: Avoid your genital area. Avoid any areas of skin that have broken skin, cuts, or scrapes. Before  surgery Follow these steps when using a CHG cloth to clean before surgery (unless your health care provider gives you different instructions): Using the CHG cloth, vigorously scrub the  part of your body where you will be having surgery. Scrub using a back-and-forth motion for 3 minutes. The area on your body should be completely wet with CHG when you are done scrubbing. Do not rinse. Discard the cloth and let the area air-dry. Do not put any substances on the area afterward, such as powder, lotion, or perfume. Put on clean clothes or pajamas. If it is the night before your surgery, sleep in clean sheets.  For general bathing Follow these steps when using CHG cloths for general bathing (unless your health care provider gives you different instructions). Use a separate CHG cloth for each area of your body. Make sure you wash between any folds of skin and between your fingers and toes. Wash your body in the following order, switching to a new cloth after each step: The front of your neck, shoulders, and chest. Both of your arms, under your arms, and your hands. Your stomach and groin area, avoiding the genitals. Your right leg and foot. Your left leg and foot. The back of your neck, your back, and your buttocks. Do not rinse. Discard the cloth and let the area air-dry. Do not put any substances on your body afterward--such as powder, lotion, or perfume--unless you are told to do so by your health care provider. Only use lotions that are recommended by the manufacturer. Put on clean clothes or pajamas. Contact a health care provider if: Your skin gets irritated after scrubbing. You have questions about using your solution or cloth. You swallow any chlorhexidine. Call your local poison control center (1-847-391-3432 in the [place] Get help right away if: Your eyes itch badly, or they become very red or swollen. Your skin itches badly and is red or swollen. Your hearing changes. You have trouble seeing. You have swelling or tingling in your mouth or throat. You have trouble breathing. These symptoms may represent a serious problem that is an emergency. Do not wait to see if the  symptoms will go away. Get medical help right away. Call your local emergency services (911 in the [place] Do not drive yourself to the hospital. Summary Chlorhexidine gluconate (CHG) is a germ-killing (antiseptic) solution that is used to clean the skin. Cleaning your skin with CHG may help to lower your risk for infection. You may be given CHG to use for bathing. It may be in a bottle or in a prepackaged cloth to use on your skin. Carefully follow your health care provider's instructions and the instructions on the product label. Do not use CHG if you have a chlorhexidine allergy. Contact your health care provider if your skin gets irritated after scrubbing. This information is not intended to replace advice given to you by your health care provider. Make sure you discuss any questions you have with your health care provider. Document Revised: 03/02/2022 Document Reviewed: 01/13/2021 Elsevier Patient Education  [place].

## 2022-08-06 ENCOUNTER — Encounter (HOSPITAL_COMMUNITY)
Admission: RE | Admit: 2022-08-06 | Discharge: 2022-08-06 | Disposition: A | Discharge status: Home / Self Care | Payer: Medicare PPO | Source: Ambulatory Visit | Attending: General Surgery | Admitting: General Surgery

## 2022-08-06 ENCOUNTER — Ambulatory Visit (HOSPITAL_COMMUNITY)
Admission: RE | Admit: 2022-08-06 | Discharge: 2022-08-06 | Disposition: A | Discharge status: Home / Self Care | Payer: Medicare PPO | Source: Ambulatory Visit | Attending: General Surgery | Admitting: General Surgery

## 2022-08-06 ENCOUNTER — Encounter (HOSPITAL_COMMUNITY): Payer: Self-pay

## 2022-08-06 ENCOUNTER — Other Ambulatory Visit: Payer: Self-pay

## 2022-08-06 VITALS — BP 122/64 | HR 97 | Temp 97.8°F | Resp 18 | Ht 65.0 in | Wt 175.0 lb

## 2022-08-06 DIAGNOSIS — C50211 Malignant neoplasm of upper-inner quadrant of right female breast: Secondary | ICD-10-CM | POA: Insufficient documentation

## 2022-08-06 DIAGNOSIS — Z01818 Encounter for other preprocedural examination: Secondary | ICD-10-CM | POA: Diagnosis not present

## 2022-08-06 DIAGNOSIS — I10 Essential (primary) hypertension: Secondary | ICD-10-CM | POA: Diagnosis not present

## 2022-08-06 DIAGNOSIS — Z170 Estrogen receptor positive status [ER+]: Secondary | ICD-10-CM | POA: Diagnosis not present

## 2022-08-06 DIAGNOSIS — E119 Type 2 diabetes mellitus without complications: Secondary | ICD-10-CM | POA: Insufficient documentation

## 2022-08-06 LAB — CBC WITH DIFFERENTIAL/PLATELET
Abs Immature Granulocytes: 0.02 10*3/uL (ref 0.00–0.07)
Basophils Absolute: 0 10*3/uL (ref 0.0–0.1)
Basophils Relative: 1 %
Eosinophils Absolute: 0.1 10*3/uL (ref 0.0–0.5)
Eosinophils Relative: 2 %
HCT: 38.1 % (ref 36.0–46.0)
Hemoglobin: 12 g/dL (ref 12.0–15.0)
Immature Granulocytes: 0 %
Lymphocytes Relative: 21 %
Lymphs Abs: 1.3 10*3/uL (ref 0.7–4.0)
MCH: 28.2 pg (ref 26.0–34.0)
MCHC: 31.5 g/dL (ref 30.0–36.0)
MCV: 89.6 fL (ref 80.0–100.0)
Monocytes Absolute: 0.6 10*3/uL (ref 0.1–1.0)
Monocytes Relative: 10 %
Neutro Abs: 4 10*3/uL (ref 1.7–7.7)
Neutrophils Relative %: 66 %
Platelets: 195 10*3/uL (ref 150–400)
RBC: 4.25 MIL/uL (ref 3.87–5.11)
RDW: 15.5 % (ref 11.5–15.5)
WBC: 6 10*3/uL (ref 4.0–10.5)
nRBC: 0 % (ref 0.0–0.2)

## 2022-08-06 LAB — COMPREHENSIVE METABOLIC PANEL
ALT: 19 U/L (ref 0–44)
AST: 21 U/L (ref 15–41)
Albumin: 4.1 g/dL (ref 3.5–5.0)
Alkaline Phosphatase: 60 U/L (ref 38–126)
Anion gap: 9 (ref 5–15)
BUN: 26 mg/dL — ABNORMAL HIGH (ref 8–23)
CO2: 22 mmol/L (ref 22–32)
Calcium: 9.9 mg/dL (ref 8.9–10.3)
Chloride: 108 mmol/L (ref 98–111)
Creatinine, Ser: 1.29 mg/dL — ABNORMAL HIGH (ref 0.44–1.00)
GFR, Estimated: 43 mL/min — ABNORMAL LOW (ref >60)
Glucose, Bld: 103 mg/dL — ABNORMAL HIGH (ref 70–99)
Potassium: 5.2 mmol/L — ABNORMAL HIGH (ref 3.5–5.1)
Sodium: 139 mmol/L (ref 135–145)
Total Bilirubin: 0.5 mg/dL (ref 0.3–1.2)
Total Protein: 6.8 g/dL (ref 6.5–8.1)

## 2022-08-06 LAB — HEMOGLOBIN A1C
Hgb A1c MFr Bld: 6.1 % — ABNORMAL HIGH (ref 4.8–5.6)
Mean Plasma Glucose: 128.37 mg/dL

## 2022-08-07 NOTE — Pre-Procedure Instructions (Signed)
Dr Adalberto Ill aware of potassium. We will do Istat on arrival 08/10/2022.

## 2022-08-07 NOTE — Pre-Procedure Instructions (Signed)
Comp panel results routed to Dr Arnoldo Morale.

## 2022-08-10 ENCOUNTER — Ambulatory Visit (HOSPITAL_COMMUNITY)
Admission: RE | Admit: 2022-08-10 | Discharge: 2022-08-10 | Disposition: A | Discharge status: Home / Self Care | Payer: Medicare PPO | Source: Ambulatory Visit | Attending: General Surgery | Admitting: General Surgery

## 2022-08-10 ENCOUNTER — Ambulatory Visit (HOSPITAL_BASED_OUTPATIENT_CLINIC_OR_DEPARTMENT_OTHER): Payer: Medicare PPO | Admitting: Anesthesiology

## 2022-08-10 ENCOUNTER — Ambulatory Visit (HOSPITAL_COMMUNITY): Payer: Medicare PPO | Admitting: Anesthesiology

## 2022-08-10 ENCOUNTER — Other Ambulatory Visit: Payer: Self-pay

## 2022-08-10 ENCOUNTER — Encounter (HOSPITAL_COMMUNITY): Payer: Self-pay | Admitting: General Surgery

## 2022-08-10 ENCOUNTER — Ambulatory Visit (HOSPITAL_COMMUNITY)
Admission: RE | Admit: 2022-08-10 | Discharge: 2022-08-10 | Disposition: A | Discharge status: Home / Self Care | Payer: Medicare PPO | Attending: General Surgery | Admitting: General Surgery

## 2022-08-10 ENCOUNTER — Ambulatory Visit (HOSPITAL_COMMUNITY): Payer: Medicare PPO

## 2022-08-10 ENCOUNTER — Encounter (HOSPITAL_COMMUNITY)
Admission: RE | Disposition: A | Discharge status: Home / Self Care | Payer: Self-pay | Source: Home / Self Care | Attending: General Surgery

## 2022-08-10 DIAGNOSIS — N183 Chronic kidney disease, stage 3 unspecified: Secondary | ICD-10-CM | POA: Insufficient documentation

## 2022-08-10 DIAGNOSIS — Z7984 Long term (current) use of oral hypoglycemic drugs: Secondary | ICD-10-CM | POA: Insufficient documentation

## 2022-08-10 DIAGNOSIS — N189 Chronic kidney disease, unspecified: Secondary | ICD-10-CM

## 2022-08-10 DIAGNOSIS — E875 Hyperkalemia: Secondary | ICD-10-CM

## 2022-08-10 DIAGNOSIS — E1122 Type 2 diabetes mellitus with diabetic chronic kidney disease: Secondary | ICD-10-CM | POA: Diagnosis not present

## 2022-08-10 DIAGNOSIS — D0511 Intraductal carcinoma in situ of right breast: Secondary | ICD-10-CM

## 2022-08-10 DIAGNOSIS — C50911 Malignant neoplasm of unspecified site of right female breast: Secondary | ICD-10-CM

## 2022-08-10 DIAGNOSIS — I129 Hypertensive chronic kidney disease with stage 1 through stage 4 chronic kidney disease, or unspecified chronic kidney disease: Secondary | ICD-10-CM | POA: Diagnosis not present

## 2022-08-10 DIAGNOSIS — R928 Other abnormal and inconclusive findings on diagnostic imaging of breast: Secondary | ICD-10-CM

## 2022-08-10 DIAGNOSIS — Z170 Estrogen receptor positive status [ER+]: Secondary | ICD-10-CM

## 2022-08-10 DIAGNOSIS — K219 Gastro-esophageal reflux disease without esophagitis: Secondary | ICD-10-CM | POA: Diagnosis not present

## 2022-08-10 DIAGNOSIS — F17210 Nicotine dependence, cigarettes, uncomplicated: Secondary | ICD-10-CM | POA: Insufficient documentation

## 2022-08-10 DIAGNOSIS — Z9012 Acquired absence of left breast and nipple: Secondary | ICD-10-CM | POA: Insufficient documentation

## 2022-08-10 DIAGNOSIS — C50211 Malignant neoplasm of upper-inner quadrant of right female breast: Secondary | ICD-10-CM

## 2022-08-10 LAB — GLUCOSE, CAPILLARY: Glucose-Capillary: 106 mg/dL — ABNORMAL HIGH (ref 70–99)

## 2022-08-10 LAB — POCT I-STAT, CHEM 8
BUN: 27 mg/dL — ABNORMAL HIGH (ref 8–23)
Calcium, Ion: 1.19 mmol/L (ref 1.15–1.40)
Chloride: 110 mmol/L (ref 98–111)
Creatinine, Ser: 1.3 mg/dL — ABNORMAL HIGH (ref 0.44–1.00)
Glucose, Bld: 109 mg/dL — ABNORMAL HIGH (ref 70–99)
HCT: 36 % (ref 36.0–46.0)
Hemoglobin: 12.2 g/dL (ref 12.0–15.0)
Potassium: 4.8 mmol/L (ref 3.5–5.1)
Sodium: 142 mmol/L (ref 135–145)
TCO2: 22 mmol/L (ref 22–32)

## 2022-08-10 SURGERY — PARTIAL MASTECTOMY WITH RADIO FREQUENCY LOCALIZER
Anesthesia: General | Site: Breast | Laterality: Right

## 2022-08-10 MED ORDER — MIDAZOLAM HCL 2 MG/2ML IJ SOLN
INTRAMUSCULAR | Status: AC
  Filled 2022-08-10: qty 2

## 2022-08-10 MED ORDER — TRAMADOL HCL 50 MG PO TABS: 50.0000 mg | ORAL_TABLET | Freq: Four times a day (QID) | ORAL | 0 refills | Status: DC | PRN

## 2022-08-10 MED ORDER — LACTATED RINGERS IV SOLN
INTRAVENOUS | Status: DC
  Administered 2022-08-10: 1000 mL via INTRAVENOUS

## 2022-08-10 MED ORDER — METHYLENE BLUE 1 % INJ SOLN
INTRAVENOUS | Status: AC
  Filled 2022-08-10: qty 10

## 2022-08-10 MED ORDER — BUPIVACAINE LIPOSOME 1.3 % IJ SUSP
INTRAMUSCULAR | Status: AC
  Filled 2022-08-10: qty 20

## 2022-08-10 MED ORDER — MIDAZOLAM HCL 5 MG/5ML IJ SOLN
INTRAMUSCULAR | Status: DC | PRN
  Administered 2022-08-10: 1 mg via INTRAVENOUS

## 2022-08-10 MED ORDER — ONDANSETRON HCL 4 MG/2ML IJ SOLN
INTRAMUSCULAR | Status: DC | PRN
  Administered 2022-08-10: 4 mg via INTRAVENOUS

## 2022-08-10 MED ORDER — ORAL CARE MOUTH RINSE: 15.0000 mL | Freq: Once | OROMUCOSAL | Status: AC

## 2022-08-10 MED ORDER — BUPIVACAINE LIPOSOME 1.3 % IJ SUSP
INTRAMUSCULAR | Status: DC | PRN
  Administered 2022-08-10: 20 mL

## 2022-08-10 MED ORDER — CHLORHEXIDINE GLUCONATE 0.12 % MT SOLN
15.0000 mL | Freq: Once | OROMUCOSAL | Status: AC
  Administered 2022-08-10: 15 mL via OROMUCOSAL

## 2022-08-10 MED ORDER — PROPOFOL 10 MG/ML IV BOLUS
INTRAVENOUS | Status: AC
  Filled 2022-08-10: qty 20

## 2022-08-10 MED ORDER — PENTAFLUOROPROP-TETRAFLUOROETH EX AERO
INHALATION_SPRAY | CUTANEOUS | Status: AC
  Filled 2022-08-10: qty 30

## 2022-08-10 MED ORDER — TECHNETIUM TC 99M TILMANOCEPT KIT
1.0000 | PACK | Freq: Once | INTRAVENOUS | Status: AC | PRN
  Administered 2022-08-10: 1 via INTRADERMAL

## 2022-08-10 MED ORDER — SODIUM CHLORIDE (PF) 0.9 % IJ SOLN: INTRAVENOUS | Status: DC | PRN

## 2022-08-10 MED ORDER — FENTANYL CITRATE (PF) 250 MCG/5ML IJ SOLN
INTRAMUSCULAR | Status: AC
  Filled 2022-08-10: qty 5

## 2022-08-10 MED ORDER — FENTANYL CITRATE (PF) 100 MCG/2ML IJ SOLN
INTRAMUSCULAR | Status: DC | PRN
  Administered 2022-08-10: 100 ug via INTRAVENOUS
  Administered 2022-08-10 (×2): 25 ug via INTRAVENOUS

## 2022-08-10 MED ORDER — CEFAZOLIN SODIUM-DEXTROSE 2-4 GM/100ML-% IV SOLN
2.0000 g | INTRAVENOUS | Status: AC
  Administered 2022-08-10: 2 g via INTRAVENOUS

## 2022-08-10 MED ORDER — CHLORHEXIDINE GLUCONATE CLOTH 2 % EX PADS: 6.0000 | MEDICATED_PAD | Freq: Once | CUTANEOUS | Status: DC

## 2022-08-10 MED ORDER — KETOROLAC TROMETHAMINE 30 MG/ML IJ SOLN: 15.0000 mg | Freq: Once | INTRAMUSCULAR | Status: DC

## 2022-08-10 MED ORDER — SODIUM CHLORIDE (PF) 0.9 % IJ SOLN
INTRAMUSCULAR | Status: AC
  Filled 2022-08-10: qty 10

## 2022-08-10 MED ORDER — LIDOCAINE HCL (CARDIAC) PF 100 MG/5ML IV SOSY
PREFILLED_SYRINGE | INTRAVENOUS | Status: DC | PRN
  Administered 2022-08-10: 50 mg via INTRAVENOUS

## 2022-08-10 MED ORDER — CEFAZOLIN SODIUM-DEXTROSE 2-4 GM/100ML-% IV SOLN
INTRAVENOUS | Status: AC
  Filled 2022-08-10: qty 100

## 2022-08-10 MED ORDER — PROPOFOL 10 MG/ML IV BOLUS
INTRAVENOUS | Status: DC | PRN
  Administered 2022-08-10: 140 mg via INTRAVENOUS

## 2022-08-10 MED ORDER — DEXAMETHASONE SODIUM PHOSPHATE 10 MG/ML IJ SOLN
INTRAMUSCULAR | Status: DC | PRN
  Administered 2022-08-10: 4 mg via INTRAVENOUS

## 2022-08-10 MED ORDER — 0.9 % SODIUM CHLORIDE (POUR BTL) OPTIME
TOPICAL | Status: DC | PRN
  Administered 2022-08-10: 1000 mL

## 2022-08-10 SURGICAL SUPPLY — 34 items
ADH SKN CLS APL DERMABOND .7 (GAUZE/BANDAGES/DRESSINGS) ×1
APPLIER CLIP 9.375 SM OPEN (CLIP) ×1
APR CLP SM 9.3 20 MLT OPN (CLIP) ×1
BINDER BREAST LRG (GAUZE/BANDAGES/DRESSINGS) IMPLANT
CLIP APPLIE 9.375 SM OPEN (CLIP) IMPLANT
CLOTH BEACON ORANGE TIMEOUT ST (SAFETY) ×2 IMPLANT
COVER LIGHT HANDLE STERIS (MISCELLANEOUS) ×4 IMPLANT
COVER PROBE W GEL 5X96 (DRAPES) IMPLANT
DERMABOND ADVANCED .7 DNX12 (GAUZE/BANDAGES/DRESSINGS) IMPLANT
DEVICE DUBIN W/COMP PLATE 8390 (MISCELLANEOUS) IMPLANT
DURAPREP 26ML APPLICATOR (WOUND CARE) ×2 IMPLANT
ELECT REM PT RETURN 9FT ADLT (ELECTROSURGICAL) ×1
ELECTRODE REM PT RTRN 9FT ADLT (ELECTROSURGICAL) ×2 IMPLANT
GLOVE BIOGEL PI IND STRL 7.0 (GLOVE) ×4 IMPLANT
GLOVE SURG SS PI 7.5 STRL IVOR (GLOVE) ×2 IMPLANT
GOWN STRL REUS W/TWL LRG LVL3 (GOWN DISPOSABLE) ×4 IMPLANT
KIT TURNOVER KIT A (KITS) ×2 IMPLANT
MANIFOLD NEPTUNE II (INSTRUMENTS) ×2 IMPLANT
NDL HYPO 18GX1.5 BLUNT FILL (NEEDLE) ×2 IMPLANT
NEEDLE 22X1 1/2 (OR ONLY) (NEEDLE) ×2 IMPLANT
NEEDLE HYPO 18GX1.5 BLUNT FILL (NEEDLE) ×1 IMPLANT
NS IRRIG 1000ML POUR BTL (IV SOLUTION) ×2 IMPLANT
PACK MINOR (CUSTOM PROCEDURE TRAY) ×2 IMPLANT
PAD ARMBOARD 7.5X6 YLW CONV (MISCELLANEOUS) ×2 IMPLANT
SET BASIN LINEN APH (SET/KITS/TRAYS/PACK) ×2 IMPLANT
SET LOCALIZER 20 PROBE US (MISCELLANEOUS) ×2 IMPLANT
SPONGE T-LAP 18X18 ~~LOC~~+RFID (SPONGE) ×2 IMPLANT
SUT MNCRL AB 4-0 PS2 18 (SUTURE) ×2 IMPLANT
SUT SILK 3 0 (SUTURE) ×1
SUT SILK 3-0 FS1 18XBRD (SUTURE) ×2 IMPLANT
SUT VIC AB 3-0 SH 27 (SUTURE) ×4
SUT VIC AB 3-0 SH 27X BRD (SUTURE) ×2 IMPLANT
SYR 20ML LL LF (SYRINGE) ×4 IMPLANT
SYR BULB IRRIG 60ML STRL (SYRINGE) IMPLANT

## 2022-08-10 NOTE — Anesthesia Postprocedure Evaluation (Signed)
Anesthesia Post Note  Patient: [name]  Procedure(s) Performed: PARTIAL MASTECTOMY WITH RADIO FREQUENCY LOCALIZER/WITH AXILARY SENTINEL NODE BX (Right: Breast)  Patient location during evaluation: Phase II Anesthesia Type: General Level of consciousness: awake Pain management: pain level controlled Vital Signs Assessment: post-procedure vital signs reviewed and stable Respiratory status: spontaneous breathing and respiratory function stable Cardiovascular status: blood pressure returned to baseline and stable Postop Assessment: no headache and no apparent nausea or vomiting Anesthetic complications: no Comments: Late entry   No notable events documented.   Last Vitals:  Vitals:   08/10/22 1116 08/10/22 1124  BP: (!) 190/104 (!) 164/70  Pulse: 96 93  Resp: 16 17  Temp: 36.8 C 36.8 C  SpO2: 96% 96%    Last Pain:  Vitals:   08/10/22 1120  TempSrc:   PainSc: [place]

## 2022-08-10 NOTE — Op Note (Signed)
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  She now presents for right partial mastectomy after radiofrequency tag placement and sentinel lymph node biopsy.  The risks and benefits of both procedures including bleeding, infection, arm swelling, nerve injury, and the possibility of incomplete resection were fully explained to the patient, who gave informed consent.  Procedure note: The patient was placed in the supine position.  After general anesthesia was administered, methylene blue was injected into the upper, inner quadrant subdermally of the right breast.  It was then massaged into place for 5 minutes.  The patient already received radio active injection in the preop area.  The right breast and axilla were then prepped and draped using the usual sterile technique with ChloraPrep.  Surgical site confirmation was performed.  A curvilinear incision was made in the upper, inner quadrant of the right breast.  Using the radiofrequency tag localizer, I was able to locate the area to be removed.  A wide resection down to the chest wall was performed.  A short suture was placed superiorly and a long suture placed laterally for orientation purposes.  The specimen was then removed from the operative field and sent to radiology for specimen radiography.  The radiofrequency tag as well as the previously placed clip within the specimen removed.  It was then sent to pathology for further examination.  A bleeding was controlled using Bovie electrocautery.  The wound was irrigated with normal  saline.  It was then packed.  I next proceeded with a right axillary sentinel lymph node biopsy.  Using the neoprobe, a sentinel lymph node biopsy was performed.  I was able to find a free sentinel lymph nodes, all were blue.  All were removed and any bleeding was controlled using small clips.  The basin count at the end of the removal was less than 10% of the ex vivo count.  All 3 lymph nodes were then sent to pathology for further examination.  A bleeding was controlled using small clips.  The wound was irrigated with normal saline.  Both incisions were injected with Exparel.  3-0 Vicryl interrupted subcutaneous sutures were placed.  Both incisions were closed using a 4-0 Monocryl subcuticular suture.  Dermabond was applied.  All tape and needle counts were correct at the end of the procedure.  The patient was awakened and transferred to PACU in stable condition.  Complications: None  EBL: Minimal  Specimen: Right breast tissue, right axillary sentinel lymph nodes

## 2022-08-10 NOTE — Interval H&P Note (Signed)
History and Physical Interval Note:  08/10/2022 8:37 AM  [name]  has presented today for surgery, with the diagnosis of DCIS, RIGHT BREAST ER +.  The various methods of treatment have been discussed with the patient and family. After consideration of risks, benefits and other options for treatment, the patient has consented to  Procedure(s) with comments: PARTIAL MASTECTOMY WITH RADIO FREQUENCY LOCALIZER/WITH AXILARY SENTINEL NODE BX (Right) - Sentinel Node Injection @ 8:00am as a surgical intervention.  The patient's history has been reviewed, patient examined, no change in status, stable for surgery.  I have reviewed the patient's chart and labs.  Questions were answered to the patient's satisfaction.     Aviva Signs

## 2022-08-10 NOTE — Progress Notes (Signed)
Sentinel node completed on right breast @ 0820. Tolerated well.

## 2022-08-10 NOTE — Anesthesia Procedure Notes (Signed)
Procedure Name: LMA Insertion Date/Time: 08/10/2022 9:13 AM  Performed by: Jonna Munro, CRNAPre-anesthesia Checklist: Patient identified, Emergency Drugs available, Suction available, Patient being monitored and Timeout performed Patient Re-evaluated:Patient Re-evaluated prior to induction Oxygen Delivery Method: Circle system utilized Preoxygenation: Pre-oxygenation with 100% oxygen Induction Type: IV induction LMA: LMA inserted LMA Size: 4.0 Number of attempts: 1 Placement Confirmation: positive ETCO2, CO2 detector and breath sounds checked- equal and bilateral Tube secured with: Tape Dental Injury: Teeth and Oropharynx as per pre-operative assessment

## 2022-08-10 NOTE — Anesthesia Preprocedure Evaluation (Signed)
Anesthesia Evaluation  Patient identified by MRN, date of birth, ID band Patient awake    Reviewed: Allergy & Precautions, H&P , NPO status , Patient's Chart, lab work & pertinent test results, reviewed documented beta blocker date and time   Airway Mallampati: I  TM Distance: >3 FB Neck ROM: full    Dental no notable dental hx.    Pulmonary neg pulmonary ROS, former smoker,    Pulmonary exam normal breath sounds clear to auscultation       Cardiovascular Exercise Tolerance: Good hypertension, negative cardio ROS   Rhythm:regular Rate:Normal     Neuro/Psych negative neurological ROS  negative psych ROS   GI/Hepatic Neg liver ROS, GERD  Medicated,  Endo/Other  negative endocrine ROSdiabetes, Type 2  Renal/GU CRFRenal disease  negative genitourinary   Musculoskeletal   Abdominal   Peds  Hematology  (+) Blood dyscrasia, anemia ,   Anesthesia Other Findings   Reproductive/Obstetrics negative OB ROS                             Anesthesia Physical  Anesthesia Plan  ASA: III  Anesthesia Plan: General and General LMA   Post-op Pain Management:    Induction:   PONV Risk Score and Plan: Ondansetron  Airway Management Planned:   Additional Equipment:   Intra-op Plan:   Post-operative Plan:   Informed Consent: I have reviewed the patients History and Physical, chart, labs and discussed the procedure including the risks, benefits and alternatives for the proposed anesthesia with the patient or authorized representative who has indicated his/her understanding and acceptance.     Dental Advisory Given  Plan Discussed with: CRNA  Anesthesia Plan Comments:         Anesthesia Quick Evaluation

## 2022-08-10 NOTE — Transfer of Care (Signed)
Immediate Anesthesia Transfer of Care Note  Patient: [name]  Procedure(s) Performed: PARTIAL MASTECTOMY WITH RADIO FREQUENCY LOCALIZER/WITH AXILARY SENTINEL NODE BX (Right: Breast)  Patient Location: PACU  Anesthesia Type:General  Level of Consciousness: awake, alert , oriented and patient cooperative  Airway & Oxygen Therapy: Patient Spontanous Breathing and Patient connected to nasal cannula oxygen  Post-op Assessment: Report given to RN, Post -op Vital signs reviewed and stable and Patient moving all extremities X 4  Post vital signs: Reviewed and stable  Last Vitals:  Vitals Value Taken Time  BP 166/87 08/10/22 1045  Temp    Pulse 93 08/10/22 1054  Resp 18 08/10/22 1054  SpO2 95 % 08/10/22 1054  Vitals shown include unvalidated device data.  Last Pain:  Vitals:   08/10/22 0802  TempSrc: Oral  PainSc: 0-No pain      Patients Stated Pain Goal: 10 (95/18/84 1660)  Complications: No notable events documented.

## 2022-08-13 DIAGNOSIS — Z87891 Personal history of nicotine dependence: Secondary | ICD-10-CM | POA: Insufficient documentation

## 2022-08-13 DIAGNOSIS — N3946 Mixed incontinence: Secondary | ICD-10-CM | POA: Insufficient documentation

## 2022-08-13 DIAGNOSIS — C50211 Malignant neoplasm of upper-inner quadrant of right female breast: Secondary | ICD-10-CM

## 2022-08-13 DIAGNOSIS — E1159 Type 2 diabetes mellitus with other circulatory complications: Secondary | ICD-10-CM | POA: Insufficient documentation

## 2022-08-13 DIAGNOSIS — E119 Type 2 diabetes mellitus without complications: Secondary | ICD-10-CM | POA: Insufficient documentation

## 2022-08-20 ENCOUNTER — Encounter: Payer: Self-pay | Admitting: General Surgery

## 2022-08-20 ENCOUNTER — Ambulatory Visit (INDEPENDENT_AMBULATORY_CARE_PROVIDER_SITE_OTHER): Payer: Medicare PPO | Admitting: General Surgery

## 2022-08-20 ENCOUNTER — Other Ambulatory Visit: Payer: Self-pay

## 2022-08-20 VITALS — BP 118/74 | HR 94 | Temp 97.7°F | Resp 18 | Ht 65.0 in | Wt 176.0 lb

## 2022-08-20 DIAGNOSIS — Z09 Encounter for follow-up examination after completed treatment for conditions other than malignant neoplasm: Secondary | ICD-10-CM

## 2022-08-20 NOTE — Progress Notes (Signed)
Subjective:     [name]  Postoperative visit performed with patient.  Patient started having some swelling in the right axilla over the past 24 hours.  He is nontender to touch.  She is otherwise doing well. Objective:    BP 118/74   Pulse 94   Temp 97.7 F (36.5 C) (Oral)   Resp 18   Ht '5\' 5"'$  (1.651 m)   Wt 176 lb (79.8 kg)   SpO2 92%   BMI 29.29 kg/m   General:  alert, cooperative, and no distress  Right breast incision healing well.  Right axillary incision healing well.  A 100 cc seroma was aspirated. Final pathology is still pending.  They are trying to decide whether there is any invasive component present.  Patient made aware.  Both sentinel lymph nodes were negative.     Assessment:    Doing well postoperatively. Postoperative seroma in axilla    Plan:   Follow-up here on 09/02/2022 for wound check.  Patient already has an appointment to see oncology later in the month.

## 2022-09-01 ENCOUNTER — Encounter (HOSPITAL_COMMUNITY): Payer: Self-pay

## 2022-09-02 ENCOUNTER — Encounter: Payer: Self-pay | Admitting: General Surgery

## 2022-09-02 ENCOUNTER — Ambulatory Visit (INDEPENDENT_AMBULATORY_CARE_PROVIDER_SITE_OTHER): Payer: Medicare PPO | Admitting: General Surgery

## 2022-09-02 VITALS — BP 152/77 | HR 84 | Temp 97.7°F | Resp 16 | Ht 65.0 in | Wt 174.0 lb

## 2022-09-02 DIAGNOSIS — Z09 Encounter for follow-up examination after completed treatment for conditions other than malignant neoplasm: Secondary | ICD-10-CM

## 2022-09-02 LAB — SURGICAL PATHOLOGY

## 2022-09-02 NOTE — Progress Notes (Signed)
Subjective:     [name]  Patient here for follow-up, status post right partial mastectomy with sentinel lymph node biopsy.  Patient is doing well.  She has no complaints. Objective:    BP (!) 152/77   Pulse 84   Temp 97.7 F (36.5 C) (Oral)   Resp 16   Ht '5\' 5"'$  (1.651 m)   Wt 174 lb (78.9 kg)   SpO2 93%   BMI 28.96 kg/m   General:  alert, cooperative, and no distress  Right breast and axillary incisions both healing well.  No hematoma or ecchymosis present.  Pathology results reviewed.  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  1 margin was listed as less than 1 mm, but clear.  6 lymph nodes negative.     Assessment:    Doing well postoperatively.    Plan:   Patient will be following up with Dr. Delton Coombes of oncology on 09/15/2022.  Even though 1 margin is close, given her age, we may not need to go for more margins.  I will defer to Dr. Delton Coombes for his input.  Follow-up here as needed.

## 2022-09-15 ENCOUNTER — Inpatient Hospital Stay: Payer: Medicare PPO | Attending: Hematology | Admitting: Hematology

## 2022-09-15 ENCOUNTER — Other Ambulatory Visit: Payer: Self-pay | Admitting: Hematology

## 2022-09-15 VITALS — BP 137/88 | HR 88 | Temp 97.4°F | Resp 17 | Ht 65.0 in | Wt 170.9 lb

## 2022-09-15 DIAGNOSIS — E559 Vitamin D deficiency, unspecified: Secondary | ICD-10-CM | POA: Diagnosis not present

## 2022-09-15 DIAGNOSIS — E538 Deficiency of other specified B group vitamins: Secondary | ICD-10-CM | POA: Diagnosis not present

## 2022-09-15 DIAGNOSIS — N183 Chronic kidney disease, stage 3 unspecified: Secondary | ICD-10-CM | POA: Diagnosis not present

## 2022-09-15 DIAGNOSIS — Z170 Estrogen receptor positive status [ER+]: Secondary | ICD-10-CM

## 2022-09-15 DIAGNOSIS — M8580 Other specified disorders of bone density and structure, unspecified site: Secondary | ICD-10-CM | POA: Insufficient documentation

## 2022-09-15 DIAGNOSIS — D0511 Intraductal carcinoma in situ of right breast: Secondary | ICD-10-CM | POA: Diagnosis present

## 2022-09-15 DIAGNOSIS — C50312 Malignant neoplasm of lower-inner quadrant of left female breast: Secondary | ICD-10-CM

## 2022-09-15 DIAGNOSIS — I129 Hypertensive chronic kidney disease with stage 1 through stage 4 chronic kidney disease, or unspecified chronic kidney disease: Secondary | ICD-10-CM | POA: Diagnosis not present

## 2022-09-15 DIAGNOSIS — Z90710 Acquired absence of both cervix and uterus: Secondary | ICD-10-CM | POA: Diagnosis not present

## 2022-09-15 DIAGNOSIS — E1122 Type 2 diabetes mellitus with diabetic chronic kidney disease: Secondary | ICD-10-CM | POA: Insufficient documentation

## 2022-09-15 DIAGNOSIS — Z79811 Long term (current) use of aromatase inhibitors: Secondary | ICD-10-CM | POA: Insufficient documentation

## 2022-09-15 DIAGNOSIS — Z87891 Personal history of nicotine dependence: Secondary | ICD-10-CM | POA: Insufficient documentation

## 2022-09-15 NOTE — Progress Notes (Signed)
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 left breast lumpectomy on 06/26/2015 2.  Right breast lumpectomy and SLNB on 08/10/2022  NGS Results: Not done  CURRENT THERAPY: Anastrozole  BRIEF ONCOLOGIC HISTORY:  Oncology History  Breast cancer ([place]) (Resolved)  08/16/2015 Initial Diagnosis   Breast cancer ([place])   09/11/2015 -  Radiation Therapy   Dr. 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 ER+ 100%, PR+ 80%   05/02/2015 Procedure   Left breast stereotactic core needle biopsy   06/26/2015 Oncotype testing   ONCOTYPE DX assay with recurrence score result of 5, Low risk, 10 year risk of distance recurrence tamoxifen alone 5%   06/26/2015 Definitive Surgery   Breast partial mastectomy after needle localization with Dr. Arnoldo Morale no sentinel node performed   06/26/2015 Pathology Results   INVASIVE DUCTAL CARCINOMA, 2 CM. - HIGH GRADE DUCTAL CARCINOMA IN SITU. - INVASIVE CARCINOMA FOCALLY LESS THAN 0.1 CM FROM LATERAL MARGIN. - DUCTAL CARCINOMA IN SITU FOCALLY LESS THAN 0.1 CM FROM THE LATERAL MARGIN.   06/26/2015 Pathology Results   ER+ 100%, PR + 60%, Ki-67 25%, HER2 NEGATIVE   07/31/2015 Procedure   Sentinel node procedure   07/31/2015 Pathology Results   ONE BENIGN LYMPH NODE (0/1).   08/26/2015 Imaging   Bone density- Normal   09/10/2015 - 10/08/2015 Radiation Therapy   50 Gy left breast  by Dr. Isidore Moos.   10/16/2015 -  Anti-estrogen oral therapy   Arimidex   05/08/2016 Procedure   Stereotactic-guided biopsies of the calcifications of left breast.   05/08/2016 Pathology Results   Breast, left, needle core biopsy, lateral and ant to lumpectomy site FIBROCYSTIC CHANGES VASCULAR CALCIFICATIONS NO EVIDENCE OF MALIGNANCY   05/08/2016 Pathology Results   2. Breast, left, needle core biopsy, lumpectomy site FAT NECROSIS WITH FIBROSIS AND MICROCALCIFICATIONS NO EVIDENCE OF MALIGNANCY   06/09/2016 Surgery   Left breast biopsy for microcalcifications. Path: benign.     CANCER STAGING: Cancer Staging  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 [facility]) Staging form: Breast, AJCC 7th Edition - Clinical stage from 11/19/2015: Stage IA (T1c, N0, M0) - Signed by Baird Cancer, PA-C on 11/19/2015    INTERVAL HISTORY:  Ms. Picha 76 y.o. female seen for follow-up under newly diagnosed right breast secretory carcinoma.  She was found to have abnormal mammogram followed by biopsy on 06/29/2022 which showed right breast DCIS.  She then underwent lumpectomy and sentinel lymph node biopsy on 08/10/2022.  She is recovered well from surgery.  She reports energy levels of 60%.    REVIEW OF SYSTEMS:  Review of Systems  Genitourinary:  Positive for frequency.   Neurological:  Positive for dizziness.  All other systems reviewed and are negative.    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   History of kidney stones    History of radiation therapy 09-10-2015 to [phone]   left  breast 50Gy   Iron deficiency anemia    Malignant neoplasm of lower-inner quadrant of left breast in female, estrogen receptor positive [facility]) oncologist-  dr Mathis Dad higgs ([facility])   dx 08-16-2015--  Stage 1A invasisive (T1c,N0,M0), ER/PR+, HER2 negative---- 06-26-2015 left partial mastectomy , 07-31-2015 left axilla node dissection x1 (negative)-- completed radiation 10-08-2015 and started arimidex 10-16-2015   Mixed hyperlipidemia    Nephrolithiasis    bilateral per CT 04-28-2018   Type 2 diabetes mellitus ([place])    followed by pcp   Ureteral calculi    left   Urgency of urination    Wears glasses    Past Surgical History:  Procedure Laterality Date   ABDOMINAL HYSTERECTOMY  2005   AXILLARY SENTINEL NODE BIOPSY Left 07/31/2015   Procedure: SENTINEL LYMPH NODE BIOPSY, LEFT AXILLA, ;  Surgeon: Aviva Signs, MD;  Location: [facility];  Service: General;  Laterality: Left;  Sentinel Node @ 1000   BREAST BIOPSY Left 06/09/2016   Procedure: LEFT BREAST BIOPSY AFTER NEEDLE LOCALIZATION;  Surgeon: Aviva Signs, MD;  Location: [facility];  Service: General;  Laterality: Left;   CARPAL TUNNEL RELEASE Left 11/02/2014   Procedure: CARPAL TUNNEL RELEASE;  Surgeon: Charlotte Crumb, MD;  Location: [facility];  Service: Orthopedics;  Laterality: Left;   CATARACT EXTRACTION W/ INTRAOCULAR LENS  IMPLANT, BILATERAL  2007   COLONOSCOPY N/A 02/03/2017   Procedure: COLONOSCOPY;  Surgeon: Danie Binder, MD;  [facility];  Service: Endoscopy;  Laterality: N/A;  1245   COLONOSCOPY N/A 03/01/2020   Procedure: COLONOSCOPY;  Surgeon: Danie Binder, MD;  [facility];  Service: Endoscopy;  Laterality: N/A;  10:30am   COLONOSCOPY W/ BIOPSIES AND POLYPECTOMY     CYSTOSCOPY WITH RETROGRADE PYELOGRAM, URETEROSCOPY AND STENT PLACEMENT Left 05/09/2018   Procedure: CYSTOSCOPY WITH RETROGRADE PYELOGRAM, URETEROSCOPY AND STENT PLACEMENT;  Surgeon: Cleon Gustin, MD;  Location: [facility];  Service: Urology;  Laterality: Left;   CYSTOSCOPY/URETEROSCOPY/HOLMIUM LASER/STENT PLACEMENT Bilateral 03/18/2017   Procedure: CYSTOSCOPY/ BILATERAL RETROGRADE/RIGHT URETEROSCOPY/ RIGHT HOLMIUM LASER APPLICATION/RIGHT URETERAL STENT PLACEMENT;  Surgeon: Irine Seal, MD;  Location: [facility];  Service: Urology;  Laterality: Bilateral;   EXTRACORPOREAL SHOCK WAVE LITHOTRIPSY Right 12/14/2019   Procedure: EXTRACORPOREAL SHOCK WAVE LITHOTRIPSY (ESWL);  Surgeon: Festus Aloe, MD;  [facility];  Service: Urology;  Laterality: Right;   EXTRACORPOREAL SHOCK WAVE LITHOTRIPSY Left 02/01/2020   Procedure: EXTRACORPOREAL SHOCK WAVE LITHOTRIPSY (ESWL);  Surgeon: Alexis Frock, MD;  Location[facility];  Service: Urology;  Laterality: Left;   HOLMIUM LASER APPLICATION Left 4/43/1540   Procedure: HOLMIUM LASER APPLICATION;  Surgeon: Cleon Gustin, MD;  Location: [facility];  Service: Urology;  Laterality: Left;   KNEE ARTHROSCOPY WITH LATERAL MENISECTOMY Right 10/18/2020   Procedure: KNEE ARTHROSCOPY WITH LATERAL MENISECTOMY;  Surgeon: Carole Civil, MD;  [facility];  Service: Orthopedics;  Laterality: Right;   OPEN REDUCTION INTERNAL FIXATION (ORIF) DISTAL RADIAL FRACTURE Left 11/02/2014   Procedure: OPEN REDUCTION INTERNAL FIXATION (ORIF)  DISTAL RADIAL FRACTURE;  Surgeon: Charlotte Crumb, MD;  Location: [facility];  Service: Orthopedics;  Laterality: Left;   PARTIAL MASTECTOMY WITH NEEDLE LOCALIZATION Left 06/26/2015   Procedure: PARTIAL MASTECTOMY WITH NEEDLE LOCALIZATION;  Surgeon: Aviva Signs, MD;  [facility];  Service: General;  Laterality: Left;  Needle Loc @ 8:00am   POLYPECTOMY  02/03/2017   Procedure: POLYPECTOMY;  Surgeon: Danie Binder, MD;  [facility];  Service: Endoscopy;;  descending and hepatic flexure   POLYPECTOMY  03/01/2020   Procedure: POLYPECTOMY;  Surgeon: Danie Binder, MD;  [facility];  Service: Endoscopy;;  ascending;splenic flexure polyps x2;rectal   TARSAL METATARSAL ARTHRODESIS Left 09-07-2007   dr Beola Cord  [facility]   first and second tarsal metatarsal fusion, gastroc slide, neurolysis   TOTAL ABDOMINAL HYSTERECTOMY W/ BILATERAL SALPINGOOPHORECTOMY  10-21-2004   dr soper  [facility]     SOCIAL HISTORY:  Social History   Socioeconomic History   Marital status: Married    Spouse name: Therapist, nutritional   Number of children: 2   Years of education: gef   Highest education level: Not on file  Occupational History   Occupation: Retired  Tobacco Use   Smoking status: Former    Packs/day: 1.00    Years: 20.00    Total pack years: 20.00    Types: Cigarettes    Quit date: 06/05/1986    Years since quitting: 36.3   Smokeless tobacco: Never  Vaping Use   Vaping Use: Never used  Substance and Sexual Activity   Alcohol use: No   Drug use: No   Sexual activity: Never    Birth control/protection: Surgical  Other Topics Concern   Not on file  Social History Narrative   Not on file   Social Determinants of Health   Financial Resource Strain: Not on file  Food Insecurity: Not on file  Transportation Needs: Not on file  Physical Activity: Not on file  Stress: Not on file  Social Connections: Not on file  Intimate Partner Violence: Not on file    FAMILY HISTORY:  Family History  Problem Relation Age of Onset   Hypertension Mother    Osteoporosis Mother    Diabetes Sister        x3   Colon cancer Neg Hx     CURRENT MEDICATIONS:  Outpatient Encounter Medications as of 09/15/2022  Medication Sig   acetaminophen (TYLENOL) 500 MG tablet Take 500 mg by mouth every 6 (six) hours as needed for moderate pain.   amLODipine-olmesartan (AZOR) 5-40 MG tablet Take 1 tablet by mouth daily.   anastrozole (ARIMIDEX) 1 MG tablet Take 1 tablet (1 mg total) by mouth daily.   Blood Glucose Calibration (ACCU-CHEK AVIVA) SOLN    Blood Glucose Monitoring Suppl (ACCU-CHEK AVIVA PLUS)  w/Device KIT    calcium carbonate (TUMS EX) 750 MG chewable tablet Chew 2 tablets by mouth daily.   Cholecalciferol (VITAMIN D3) 1000 units CAPS Take 1,000 Units by mouth daily.    cyanocobalamin (VITAMIN B12) 1000 MCG tablet Take 1,000 mcg by mouth every other day.   fenofibrate 54 MG tablet Take 1 tablet by mouth daily.   GEMTESA 75 MG TABS Take 75 mg by mouth daily.   glucose blood test strip 1 each 2 (two) times daily. Check Blood sugar 1-2 times daily   ibuprofen (ADVIL) 400 MG tablet Take 1 tablet (400 mg total) by mouth every 8 (eight) hours as needed.   metFORMIN (GLUCOPHAGE) 1000 MG  tablet Take 1,000 mg by mouth 2 (two) times daily with a meal.    Multiple Vitamin (MULTIVITAMIN WITH MINERALS) TABS tablet Take 1 tablet by mouth every morning. Women's One A Day 50+   Omega-3 Fatty Acids (FISH OIL) 1000 MG CAPS Take 1,000 mg by mouth daily.   omeprazole (PRILOSEC) 10 MG capsule Take 10 mg by mouth daily.   simvastatin (ZOCOR) 40 MG tablet Take 40 mg by mouth at bedtime.    [DISCONTINUED] traMADol (ULTRAM) 50 MG tablet Take 1 tablet (50 mg total) by mouth every 6 (six) hours as needed.   No facility-administered encounter medications on file as of 09/15/2022.    ALLERGIES:  No Known Allergies   PHYSICAL EXAM:  ECOG Performance status: 1  Vitals:   09/15/22 1433  BP: 137/88  Pulse: 88  Resp: 17  Temp: (!) 97.4 F (36.3 C)  SpO2: 92%   Filed Weights   09/15/22 1433  Weight: 170 lb 14.4 oz (77.5 kg)   Physical Exam Vitals reviewed.  Constitutional:      Appearance: Normal appearance.  Cardiovascular:     Rate and Rhythm: Normal rate and regular rhythm.     Pulses: Normal pulses.     Heart sounds: Normal heart sounds.  Pulmonary:     Effort: Pulmonary effort is normal.     Breath sounds: Normal breath sounds.  Abdominal:     Palpations: Abdomen is soft. There is no mass.  Neurological:     Mental Status: She is alert.  Psychiatric:        Mood and Affect: Mood  normal.        Behavior: Behavior normal.    Right breast lumpectomy scar is well-healed.  No palpable masses.  Left breast has no palpable masses or adenopathy.  LABORATORY DATA:  I have reviewed the labs as listed.  CBC    Component Value Date/Time   WBC 6.0 08/06/2022 1232   RBC 4.25 08/06/2022 1232   HGB 12.2 08/10/2022 0818   HCT 36.0 08/10/2022 0818   PLT 195 08/06/2022 1232   MCV 89.6 08/06/2022 1232   MCH 28.2 08/06/2022 1232   MCHC 31.5 08/06/2022 1232   RDW 15.5 08/06/2022 1232   LYMPHSABS 1.3 08/06/2022 1232   MONOABS 0.6 08/06/2022 1232   EOSABS 0.1 08/06/2022 1232   BASOSABS 0.0 08/06/2022 1232      Latest Ref Rng & Units 08/10/2022    8:18 AM 08/06/2022   12:32 PM 05/29/2022    9:03 AM  CMP  Glucose 70 - 99 mg/dL 109  103  97   BUN 8 - 23 mg/dL _0 Creatinine 0.44 - 1.00 mg/dL 1.30  1.29  1.33   Sodium 135 - 145 mmol/L 142  139  138   Potassium 3.5 - 5.1 mmol/L 4.8  5.2  4.9   Chloride 98 - 111 mmol/L 110  108  107   CO2 22 - 32 mmol/L  22  24   Calcium 8.9 - 10.3 mg/dL  9.9  9.6   Total Protein 6.5 - 8.1 g/dL  6.8  7.2   Total Bilirubin 0.3 - 1.2 mg/dL  0.5  0.4   Alkaline Phos 38 - 126 U/L  60  49   AST 15 - 41 U/L  21  24   ALT 0 - 44 U/L  19  20     DIAGNOSTIC IMAGING:  I have independently reviewed the scans and discussed  with the patient.  ASSESSMENT:  1.  Stage I (T1BN0) right breast-secretory carcinoma: - Right lumpectomy and SLNB on 08/10/2022 - Pathology: 8 mm secretory carcinoma, invasive and in situ.  Grade 2/3.  Less than 1 mm from inferior margin.  0/6 sentinel lymph nodes involved.  Case was sent to [facility] for expert review.  ER 30% positive, strong staining.  PR 0%.  Ki-67 5%.  HER2 negative ([facility] 0).  2.  Stage I left breast IDC: - Lumpectomy on 06/26/2015, ER/PR positive and HER2 negative - Radiation therapy completed on 10/09/2015 - Anastrozole started in November 2016, taking for total of 10 years  3.  Social/family  history: - She lives at home with her husband and her 2 sons.  She is independent of ADLs and IADLs. - She worked in [place] prior to retirement.  Quit smoking in late 1980s.  Smoked for 20 years. - Mother had stomach cancer.  PLAN:  1.  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  We discussed the unusual histology and in general prognosis is excellent. - She is already on antiestrogen therapy. - We discussed role of radiation therapy.  We also discussed her advanced age and the small size tumor.  She was reluctant to consider radiation. - She will follow-up with [place] in 1 year.  We will arrange for mammogram after 05/30/2023.  2.  Stage I left breast IDC: - Continue anastrozole as she is tolerating very well. - Continue yearly mammograms.  3.  Osteopenia: - DEXA scan on 12/17/2020 with T score -0.8. - Continue calcium and vitamin D supplements.  Last vitamin D was normal. - We will obtain DEXA scan prior to next visit in 1 year.   Orders placed this encounter:  Orders Placed This Encounter  Procedures   MM DIAG BREAST TOMO BILATERAL   CBC with Differential/Platelet   Comprehensive metabolic panel   Vitamin A06   VITAMIN D 25 Hydroxy (Vit-D Deficiency, Fractures)      Derek Jack, MD [place] [phone]

## 2022-09-15 NOTE — Patient Instructions (Addendum)
[place]  Discharge Instructions  You were seen and examined today by Dr. Delton Coombes.  Dr. Delton Coombes discussed your biopsy results. He recommends that you continue taking the anastrozole. Repeat mammogram in July 2024.   Follow-up as scheduled in 1 year.    Thank you for choosing [facility] to provide your oncology and hematology care.   To afford each patient quality time with our provider, please arrive at least 15 minutes before your scheduled appointment time. You may need to reschedule your appointment if you arrive late (10 or more minutes). Arriving late affects you and other patients whose appointments are after yours.  Also, if you miss three or more appointments without notifying the office, you may be dismissed from the clinic at the provider's discretion.    Again, thank you for choosing [facility].  Our hope is that these requests will decrease the amount of time that you wait before being seen by our physicians.   If you have a lab appointment with the [facility] please come in thru the Main Entrance and check in at the main information desk.           _____________________________________________________________  Should you have questions after your visit to [facility], please contact our office at [phone] and follow the prompts.  Our office hours are 8:00 a.m. to 4:30 p.m. Monday - Thursday and 8:00 a.m. to 2:30 p.m. Friday.  Please note that voicemails left after 4:00 p.m. may not be returned until the following business day.  We are closed weekends and all major holidays.  You do have access to a nurse 24-7, just call the main number to the clinic [phone] and do not press any options, hold on the line and a nurse will answer the phone.    For prescription refill requests, have your pharmacy contact our office and allow 72 hours.    Masks are optional in the cancer  centers. If you would like for your care team to wear a mask while they are taking care of you, please let them know. You may have one support person who is at least 77 years old accompany you for your appointments.

## 2022-12-08 ENCOUNTER — Other Ambulatory Visit: Payer: Self-pay | Admitting: Urology

## 2022-12-08 DIAGNOSIS — R935 Abnormal findings on diagnostic imaging of other abdominal regions, including retroperitoneum: Secondary | ICD-10-CM

## 2022-12-21 ENCOUNTER — Other Ambulatory Visit (HOSPITAL_COMMUNITY): Payer: Medicare PPO

## 2023-01-18 ENCOUNTER — Ambulatory Visit
Admission: RE | Admit: 2023-01-18 | Discharge: 2023-01-18 | Disposition: A | Discharge status: Home / Self Care | Payer: Medicare PPO | Source: Ambulatory Visit | Attending: Urology | Admitting: Urology

## 2023-01-18 DIAGNOSIS — R935 Abnormal findings on diagnostic imaging of other abdominal regions, including retroperitoneum: Secondary | ICD-10-CM

## 2023-01-18 MED ORDER — GADOPICLENOL 0.5 MMOL/ML IV SOLN
8.0000 mL | Freq: Once | INTRAVENOUS | Status: AC | PRN
  Administered 2023-01-18: 8 mL via INTRAVENOUS

## 2023-03-15 ENCOUNTER — Ambulatory Visit: Payer: Medicare PPO | Admitting: Orthopedic Surgery

## 2023-03-15 ENCOUNTER — Encounter: Payer: Self-pay | Admitting: Orthopedic Surgery

## 2023-03-15 ENCOUNTER — Other Ambulatory Visit (INDEPENDENT_AMBULATORY_CARE_PROVIDER_SITE_OTHER): Payer: Medicare PPO

## 2023-03-15 DIAGNOSIS — M25561 Pain in right knee: Secondary | ICD-10-CM

## 2023-03-15 DIAGNOSIS — M1711 Unilateral primary osteoarthritis, right knee: Secondary | ICD-10-CM

## 2023-03-15 MED ORDER — METHYLPREDNISOLONE ACETATE 40 MG/ML IJ SUSP
40.0000 mg | Freq: Once | INTRAMUSCULAR | Status: AC
  Administered 2023-03-15: 40 mg via INTRA_ARTICULAR

## 2023-03-15 NOTE — Progress Notes (Signed)
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  Arthritis is grade 4 and will eventually need a knee replacement but she had an acute trauma so I gave her an injection put her in a brace had to use her cane she will check up with [place] again in a couple weeks to see if this helped but eventually she will need a knee replacement she has some medical problems such as diabetes hypertension stage III chronic kidney disease so that will be a factor as well  I do not think MRI is necessary with that degree of arthritis  Meds ordered this encounter  Medications   methylPREDNISolone acetate (DEPO-MEDROL) injection 40 mg   Procedure note right knee injection   verbal consent was obtained to inject right knee joint  Timeout was completed to confirm the site of injection  The medications used were depomedrol 40 mg and 1% lidocaine 3 cc Anesthesia was provided by ethyl chloride and the skin was prepped with alcohol.  After cleaning the skin with alcohol a 20-gauge needle was used to inject the right knee joint. There were no complications. A sterile bandage was applied.   Subjective: Chief Complaint  Patient presents with   Knee Pain    RT knee/ hx of knee scope 10/18/20 Patient states at the end of March she was getting into the truck and almost fell. She felt a pop in her right knee/ Increasing pain since then    HPI: 77 year old female 3 years ago had a knee arthroscopy for lateral meniscal tear did reasonably well.  4 weeks ago she was getting into her truck lost her balance fell has had increased pain in the knee since that time.   Complains of medial knee pain change in range of motion and some swelling              ROS: Popping right knee some hip pain   Images personally read and my interpretation : Right knee evaluation  Visit Diagnoses:  1. Acute pain of right knee      Follow-Up Instructions: Return in about 2 weeks (around 03/29/2023) for FOLLOW UP, RIGHT, KNEE status post injection.    Objective: Vital Signs: There were no vitals taken for this visit.  Physical Exam Vitals and nursing note reviewed.  Constitutional:      Appearance: Normal appearance.  HENT:     Head: Normocephalic and atraumatic.  Eyes:     General: No scleral icterus.       Right eye: No discharge.        Left eye: No discharge.     Extraocular Movements: Extraocular movements intact.     Conjunctiva/sclera: Conjunctivae normal.     Pupils: Pupils are equal, round, and reactive to light.  Cardiovascular:     Rate and Rhythm: Normal rate.     Pulses: Normal pulses.  Skin:    General: Skin is warm and dry.     Capillary Refill: Capillary refill takes less than 2 seconds.  Neurological:     General:  No focal deficit present.     Mental Status: She is alert and oriented to person, place, and time.  Psychiatric:        Mood and Affect: Mood normal.        Behavior: Behavior normal.        Thought Content: Thought content normal.        Judgment: Judgment normal.      Ortho Exam Right knee it appears to be swollen small effusion Medial tenderness Knee does not come to extension pain throughout the range of motion up to 115 degrees of flexion ACL PCL medial and lateral collateral ligaments intact stable Motor exam muscle tone is normal Gait reasonable no significant limp  Specialty Comments:  No specialty comments available.  Imaging: DG Knee AP/LAT W/Sunrise Right  Result Date: 03/15/2023 Imaging of the right knee Status post injury right knee status post arthroscopy 3 years ago X-ray shows trace joint effusion  bone-on-bone changes in the lateral compartment subchondral sclerosis also in the lateral compartment the patellofemoral region seems to have a bone spur and enthesial fight Left knee is also in the AP imaging and shows normal alignment with mild joint space narrowing no osteophytes are seen Both femurs and tibia seem to have osteopenia Axial alignment of the patella is normal but there is a significant amount of irregularity to the patellofemoral articulation Impression lateral compartment arthrosis grade 4 patellofemoral joint disease grade 3     PMFS History: Patient Active Problem List   Diagnosis Date Noted   Malignant neoplasm of upper-inner quadrant of right breast in female, estrogen receptor positive (HCC) 08/13/2022   Urinary incontinence, mixed 08/13/2022   Obesity, diabetes, and hypertension syndrome (HCC) 08/13/2022   Former tobacco use 08/13/2022   Vitamin B12 deficiency 01/29/2021   S/P right knee arthroscopy 10/18/20 10/18/2020   Acute lateral meniscus tear of right knee    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 08/23/2020   Colorectal polyps    Personal history of colonic polyps 02/14/2020   Stage 3a chronic kidney disease (HCC) 07/21/2017   Special screening for malignant neoplasms, colon    Kidney stone 01/13/2017   Class 1 obesity due to excess calories with serious comorbidity in adult 07/16/2016   Actinic keratosis 01/12/2016   Benign essential hypertension 01/12/2016   Gastroesophageal reflux disease without esophagitis 01/12/2016   Mixed hyperlipidemia 01/12/2016   Osteopenia 01/12/2016   Seborrheic keratoses 01/12/2016   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 (HCC) 10/16/2015   History of cancer of left breast 10/16/2015   Nonspecific abnormal finding in stool contents 01/11/2012   Type 2 diabetes mellitus with hyperlipidemia (HCC) 01/11/2012   Past Medical History:  Diagnosis Date   Abdominal hernia    per  CT 04-28-2018  abdominal/ pelvic anterior wall hernia   Arthritis    CKD (chronic kidney disease), stage III (HCC)    Disorder of bone and cartilage, unspecified    Essential hypertension, benign    Full dentures    GERD (gastroesophageal reflux disease)    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   History of kidney stones    History of radiation therapy 09-10-2015 to [phone]   left breast 50Gy   Iron deficiency anemia    Malignant neoplasm of lower-inner quadrant of left breast in female, estrogen receptor positive [facility]) oncologist-  dr Minta Balsam higgs ([facility])  dx 08-16-2015--  Stage 1A invasisive (T1c,N0,M0), ER/PR+, HER2 negative---- 06-26-2015 left partial mastectomy , 07-31-2015 left axilla node dissection x1 (negative)-- completed radiation 10-08-2015 and started arimidex 10-16-2015   Mixed hyperlipidemia    Nephrolithiasis    bilateral per CT 04-28-2018   Type 2 diabetes mellitus (HCC)    followed by pcp   Ureteral calculi    left   Urgency of urination    Wears glasses     Family History  Problem Relation Age of Onset   Hypertension Mother    Osteoporosis Mother    Diabetes Sister        x3   Colon cancer Neg Hx     Past Surgical History:  Procedure Laterality Date   ABDOMINAL HYSTERECTOMY  2005   AXILLARY SENTINEL NODE BIOPSY Left 07/31/2015   Procedure: SENTINEL LYMPH NODE BIOPSY, LEFT AXILLA, ;  Surgeon: Franky Macho, MD;  Location: [facility];  Service: General;  Laterality: Left;  Sentinel Node @ 1000   BREAST BIOPSY Left 06/09/2016   Procedure: LEFT BREAST BIOPSY AFTER NEEDLE LOCALIZATION;  Surgeon: Franky Macho, MD;  Location: [facility];  Service: General;  Laterality: Left;   CARPAL TUNNEL RELEASE Left 11/02/2014   Procedure: CARPAL TUNNEL RELEASE;  Surgeon: Dairl Ponder, MD;  Location: [facility];  Service: Orthopedics;  Laterality: Left;   CATARACT EXTRACTION W/ INTRAOCULAR LENS  IMPLANT, BILATERAL  2007    COLONOSCOPY N/A 02/03/2017   Procedure: COLONOSCOPY;  Surgeon: West Bali, MD;  Location: [facility];  Service: Endoscopy;  Laterality: N/A;  1245   COLONOSCOPY N/A 03/01/2020   Procedure: COLONOSCOPY;  Surgeon: West Bali, MD;  [facility]: [facility];  Service: Endoscopy;  Laterality: N/A;  10:30am   COLONOSCOPY W/ BIOPSIES AND POLYPECTOMY     CYSTOSCOPY WITH RETROGRADE PYELOGRAM, URETEROSCOPY AND STENT PLACEMENT Left 05/09/2018   Procedure: CYSTOSCOPY WITH RETROGRADE PYELOGRAM, URETEROSCOPY AND STENT PLACEMENT;  Surgeon: Malen Gauze, MD;  Location: [facility];  Service: Urology;  Laterality: Left;   CYSTOSCOPY/URETEROSCOPY/HOLMIUM LASER/STENT PLACEMENT Bilateral 03/18/2017   Procedure: CYSTOSCOPY/ BILATERAL RETROGRADE/RIGHT URETEROSCOPY/ RIGHT HOLMIUM LASER APPLICATION/RIGHT URETERAL STENT PLACEMENT;  Surgeon: Bjorn Pippin, MD;  Location: [facility];  Service: Urology;  Laterality: Bilateral;   EXTRACORPOREAL SHOCK WAVE LITHOTRIPSY Right 12/14/2019   Procedure: EXTRACORPOREAL SHOCK WAVE LITHOTRIPSY (ESWL);  Surgeon: Jerilee Field, MD;  Location: [facility];  Service: Urology;  Laterality: Right;   EXTRACORPOREAL SHOCK WAVE LITHOTRIPSY Left 02/01/2020   Procedure: EXTRACORPOREAL SHOCK WAVE LITHOTRIPSY (ESWL);  Surgeon: Sebastian Ache, MD;  Location: [facility];  Service: Urology;  Laterality: Left;   HOLMIUM LASER APPLICATION Left 05/09/2018   Procedure: HOLMIUM LASER APPLICATION;  Surgeon: Malen Gauze, MD;  Location: [facility];  Service: Urology;  Laterality: Left;   KNEE ARTHROSCOPY WITH LATERAL MENISECTOMY Right 10/18/2020   Procedure: KNEE ARTHROSCOPY WITH LATERAL MENISECTOMY;  Surgeon: Vickki Hearing, MD;  Location: [facility];  Service: Orthopedics;  Laterality: Right;   OPEN REDUCTION INTERNAL FIXATION (ORIF) DISTAL RADIAL FRACTURE Left 11/02/2014   Procedure: OPEN REDUCTION INTERNAL FIXATION (ORIF) DISTAL  RADIAL FRACTURE;  Surgeon: Dairl Ponder, MD;  [facility];  Service: Orthopedics;  Laterality: Left;   PARTIAL MASTECTOMY WITH NEEDLE LOCALIZATION Left 06/26/2015   Procedure: PARTIAL MASTECTOMY WITH NEEDLE LOCALIZATION;  Surgeon: Franky Macho, MD;  Location: [facility];  Service: General;  Laterality: Left;  Needle Loc @ 8:00am   POLYPECTOMY  02/03/2017   Procedure: POLYPECTOMY;  Surgeon:  West Bali, MD;  [facility];  Service: Endoscopy;;  descending and hepatic flexure   POLYPECTOMY  03/01/2020   Procedure: POLYPECTOMY;  Surgeon: West Bali, MD;  [facility];  Service: Endoscopy;;  ascending;splenic flexure polyps x2;rectal   TARSAL METATARSAL ARTHRODESIS Left 09-07-2007   dr Lestine Box  [facility]   first and second tarsal metatarsal fusion, gastroc slide, neurolysis   TOTAL ABDOMINAL HYSTERECTOMY W/ BILATERAL SALPINGOOPHORECTOMY  10-21-2004   dr soper  [facility]   Social History   Occupational History   Occupation: Retired  Tobacco Use   Smoking status: Former    Packs/day: 1.00    Years: 20.00    Additional pack years: 0.00    Total pack years: 20.00    Types: Cigarettes    Quit date: 06/05/1986    Years since quitting: 36.8   Smokeless tobacco: Never  Vaping Use   Vaping Use: Never used  Substance and Sexual Activity   Alcohol use: No   Drug use: No   Sexual activity: Never    Birth control/protection: Surgical

## 2023-03-29 ENCOUNTER — Ambulatory Visit: Payer: Medicare PPO | Admitting: Orthopedic Surgery

## 2023-03-29 ENCOUNTER — Encounter: Payer: Self-pay | Admitting: Orthopedic Surgery

## 2023-03-29 DIAGNOSIS — M1711 Unilateral primary osteoarthritis, right knee: Secondary | ICD-10-CM | POA: Diagnosis not present

## 2023-03-29 DIAGNOSIS — G8929 Other chronic pain: Secondary | ICD-10-CM

## 2023-03-29 NOTE — Progress Notes (Signed)
  Follow-up visit status post injection right knee  [name] is 77 years old she has osteoarthritis of the lateral and patellofemoral compartment she had arthroscopic surgery a few years ago presented recently with worsening pain grade 4 arthritis secondary to acute trauma.  The injection was given and she was told to use a brace and a cane for the knee pain  The problem is she needs a knee replacement but she has diabetes, hypertension, stage III kidney disease and is probably not quite medically ready for surgical intervention  Her last injection was on April 29 she did get some relief  Currently her symptoms are severe pain and instability in the right knee she does not want to continue with nonoperative care bracing injection etc. she wishes to proceed with total knee replacement  Her exam findings show  Right Knee Exam   Muscle Strength  The patient has normal right knee strength.  Tenderness  Right knee tenderness location: Severe tenderness medial and lateral femoral condyles.  Range of Motion  Extension:  abnormal Right knee extension: The flexion contracture is 5 degrees. Flexion:  abnormal Right knee flexion: Knee flexion is 125 degrees.  Tests  McMurray:  Medial - negative Lateral - negative Varus: negative Valgus: negative Drawer:  Anterior - negative    Posterior - negative  Other  Erythema: absent Scars: absent Sensation: normal Pulse: present Swelling: none Effusion: no effusion present   Left Knee Exam   Muscle Strength  The patient has normal left knee strength.  Other  Erythema: absent Scars: absent Sensation: normal Pulse: present Swelling: none    Encounter Diagnoses  Name Primary?   Chronic pain of right knee Yes   Primary osteoarthritis of right knee    77 year old female acute on chronic pain right knee status post right knee arthroscopy comes in now complaining of severe pain in the right knee wishes to proceed with knee replacement  however she will need a medical consult  Risk and benefits of surgery explained  Patient has 5-6 steps to get into her house but has good family support  After medical clearance patient to be scheduled for right total knee  The procedure has been fully reviewed with the patient; The risks and benefits of surgery have been discussed and explained and understood. Alternative treatment has also been reviewed, questions were encouraged and answered. The postoperative plan is also been reviewed.

## 2023-03-29 NOTE — Patient Instructions (Signed)
Your surgery will be at [place] by Dr Harrison  plan to be in hospital overnight. The hospital will contact you with a preoperative appointment to discuss Anesthesia.  Please arrive on time or 15 minutes early for the preoperative appointment, they have a very tight schedule if you are late or do not come in your surgery will be cancelled.  The phone number for the preop area is [phone]. Please bring your medications with you for the appointment. They will tell you the arrival time for surgery and medication instructions when you have your preoperative evaluation. Do not wear nail polish the day of your surgery and if you take Phentermine you need to stop this medication ONE WEEK prior to your surgery. f you take Invokana, Farxiga, Jardiance, or Steglatro) - Hold 72 hours before the procedure.  If you take Ozempic,  Bydureon or Trulicity do not take for 8 days before your surgery. If you take Victoza, Rybelsis, Saxenda or Adlyxi stop 24 hours before the procedure. Please arrive at the hospital 2 hours before procedure if scheduled at 9:30 or later in the day or at the time the nurse tells you at your preoperative visit.   If you have my chart do not use the time given in my chart use the time given to you by the nurse during your preoperative visit.   Your surgery  time may change. Please be available for phone calls the day of your surgery and the day before. The Short Stay department may need to discuss changes about your surgery time. Not reaching the you could lead to procedure delays and possible cancellation.  You must have a ride home and someone to stay with you for 24 to 48 hours. The person taking you home will receive and sign for the your discharge instructions.  Please be prepared to give your support person's name and telephone number to [facility] [employer]. Dr Harrison will need that name and phone number post procedure.   You will also get a call from a representative of [employer], they have a machine that you will use in the first few weeks after surgery. It is called a CPM.   You will have home physical therapy for 2 weeks after surgery, the home health agency will call you before or just following the surgery to set up visits. [facility] is the agency we normally use, unless you request another agency.   You will get a call also from outpatient therapy for therapy starting when the home therapy is done.  If you have questions or need to Reschedule the surgery, call the office ask for [place] Hot[facility].    

## 2023-04-01 ENCOUNTER — Telehealth: Payer: Self-pay | Admitting: Orthopedic Surgery

## 2023-04-01 NOTE — Telephone Encounter (Signed)
I spoke to Dr Romeo Apple we received a clearance for her total knee replacement I will call her to schedule, ok to proceed per Dr Romeo Apple

## 2023-04-06 ENCOUNTER — Other Ambulatory Visit: Payer: Self-pay | Admitting: Orthopedic Surgery

## 2023-04-06 DIAGNOSIS — M1711 Unilateral primary osteoarthritis, right knee: Secondary | ICD-10-CM

## 2023-04-06 DIAGNOSIS — Z01818 Encounter for other preprocedural examination: Secondary | ICD-10-CM

## 2023-04-06 MED ORDER — BUPIVACAINE-MELOXICAM ER 400-12 MG/14ML IJ SOLN: 400.0000 mg | Freq: Once | INTRAMUSCULAR | Status: DC

## 2023-04-06 NOTE — Telephone Encounter (Signed)
I called patient to advise June 11th and to expect a call from [facility] in orders for Right total knee replacement

## 2023-04-19 NOTE — Patient Instructions (Signed)
[name]  04/19/2023     @PREFPERIOPPHARMACY @   Your procedure is scheduled on  04/27/2023.   Report to [facility] at  0600 A.M.   Call this number if you have problems the morning of surgery:  [phone]  If you experience any cold or flu symptoms such as cough, fever, chills, shortness of breath, etc. between now and your scheduled surgery, please notify us at the above number.   Remember:  Do not eat or drink after midnight.     Follow the enclosed instructions on preop bathing.       DO NOT take any medications for diabetes the morning of your procedure.    Take these medicines the morning of surgery with A SIP OF WATER            azor, gemtesa, omeprazole, tramadol(if needed).     Do not wear jewelry, make-up or nail polish, including gel polish,  artificial nails, or any other type of covering on natural nails (fingers and  toes).  Do not wear lotions, powders, or perfumes, or deodorant.  Do not shave 48 hours prior to surgery.  Men may shave face and neck.  Do not bring valuables to the hospital.  [facility] is not responsible for any belongings or valuables.  Contacts, dentures or bridgework may not be worn into surgery.  Leave your suitcase in the car.  After surgery it may be brought to your room.  For patients admitted to the hospital, discharge time will be determined by your treatment team.  Patients discharged the day of surgery will not be allowed to drive home and must have someone with them for 24 hours.    Special instructions:       DO NOT smoke tobacco or vape for 24 hours before your procedure.   Please read over the following fact sheets that you were given. Pain Booklet, Coughing and Deep Breathing, Blood Transfusion Information, Lab Information, Total Joint Packet, MRSA Information, Surgical Site Infection Prevention, Anesthesia Post-op Instructions, and Care and Recovery After Surgery      Total Knee Replacement,  Care After This sheet gives you information about how to care for yourself after your procedure. Your health care provider may also give you more specific instructions. If you have problems or questions, contact your health care provider. What can I expect after the procedure? After the procedure, it is common to have: Redness, pain, and swelling at the incision area. Stiffness. Discomfort. A small amount of blood or clear fluid coming from your incision. Follow these instructions at home: Medicines Take over-the-counter and prescription medicines only as told by your health care provider. If you were prescribed a blood thinner (anticoagulant), take it as told by your health care provider. Ask your health care provider if the medicine prescribed to you: Requires you to avoid driving or using machinery. Can cause constipation. You may need to take these actions to prevent or treat constipation: Drink enough fluid to keep your urine pale yellow. Take over-the-counter or prescription medicines. Eat foods that are high in fiber, such as beans, whole grains, and fresh fruits and vegetables. Limit foods that are high in fat and processed sugars, such as fried or sweet foods. Incision care  Follow instructions from your health care provider about how to take care of your incision. Make sure you: Wash your hands with soap and water for at least 20 seconds before and after you  change your bandage (dressing). If soap and water are not available, use hand sanitizer. Change your dressing as told by your health care provider. Leave stitches (sutures), staples, skin glue, or adhesive strips in place. These skin closures may need to stay in place for 2 weeks or longer. If adhesive strip edges start to loosen and curl up, you may trim the loose edges. Do not remove adhesive strips completely unless your health care provider tells you to do that. Do not take baths, swim, or use a hot tub until your health  care provider approves. Check your incision area every day for signs of infection. Check for: More redness, swelling, or pain. More fluid or blood. Warmth. Pus or a bad smell. Activity Rest as told by your health care provider. Avoid sitting for a long time without moving. Get up to take short walks every 1-2 hours. This is important to improve blood flow and breathing. Ask for help if you feel weak or unsteady. Follow instructions from your health care provider about using a walker, crutches, or a cane. You may use your legs to support (bear) your body weight as told by your health care provider. Follow instructions about how much weight you may safely support on your affected leg (weight-bearing restrictions). A physical therapist may show you how to get out of a bed and chair and how to go up and down stairs. You will first do this with a walker, crutches, or a cane and then without any of these devices. Once you are able to walk without a limp, you may stop using a walker, crutches, or a cane. Do exercises as told by your health care provider or physical therapist. Avoid high-impact activities, including running, jumping rope, and doing jumping jacks. Do not play contact sports until your health care provider approves. Return to your normal activities as told by your health care provider. Ask your health care provider what activities are safe for you. Managing pain, stiffness, and swelling  If directed, put ice on your knee. To do this: Put ice in a plastic bag or use the icing device (cold flow pad) that you were given. Follow instructions from your health care provider about how to use the icing device. Place a towel between your skin and the bag or between your skin and the icing device. Leave the ice on for 20 minutes, 2-3 times a day. Remove the ice if your skin turns bright red. This is very important. If you cannot feel pain, heat, or cold, you have a greater risk of damage to the  area. Move your toes often to reduce stiffness and swelling. Raise (elevate) your leg above the level of your heart while you are sitting or lying down. Use several pillows to keep your leg straight. Do not put a pillow just under the knee. If the knee is bent for a long time, this may lead to stiffness. Wear elastic knee support as told by your health care provider. Safety  To help prevent falls, keep floors clear of objects you may trip over. Place items that you may need within easy reach. Wear an apron or tool belt with pockets for carrying objects. This leaves your hands free to help with your balance. Ask your health care provider when it is safe to drive. General instructions Wear compression stockings as told by your health care provider. These stockings help to prevent blood clots and reduce swelling in your legs. Continue with breathing exercises. This helps prevent  lung infection. Do not use any products that contain nicotine or tobacco. These products include cigarettes, chewing tobacco, and vaping devices, such as e-cigarettes. These can delay healing after surgery. If you need help quitting, ask your health care provider. Tell your health care provider if you plan to have dental work. Also: Tell your dentist about your joint replacement. Ask your health care provider if there are any special instructions you need to follow before having dental care and routine cleanings. Keep all follow-up visits. This is important. Contact a health care provider if: You have a fever or chills. You have a cough or feel short of breath. Your medicine is not controlling your pain. You have any of these signs of infection: More redness, swelling, or pain around your incision. More fluid or blood coming from your incision. Warmth coming from your incision. Pus or a bad smell coming from your incision. You fall. Get help right away if: You have severe pain. You have trouble breathing. You  have chest pain. You have redness, swelling, pain, or warmth in your calf or leg. Your incision breaks open after sutures or staples are removed. These symptoms may represent a serious problem that is an emergency. Do not wait to see if the symptoms will go away. Get medical help right away. Call your local emergency services (911 in the [place] Do not drive yourself to the hospital. Summary After the procedure, it is common to have pain and swelling at the incision area, a small amount of blood or fluid coming from your incision, and stiffness. Follow instructions from your health care provider about how to take care of your incision. Use crutches, a walker, or a cane as told by your health care provider. This information is not intended to replace advice given to you by your health care provider. Make sure you discuss any questions you have with your health care provider. Document Revised: 04/23/2020 Document Reviewed: 04/23/2020 Elsevier Patient Education  2024 [employer] Spinal Anesthesia and Epidural Anesthesia, Care After While the medicines you were given are still having effects, it is common to: Feel itchy. Vomit or feel like you may vomit. Feel dizzy. Have a numb feeling and tingling in your legs. Have problems when you pee (urinate). Follow these instructions at home: For the time period you were told by your doctor: Rest. Do not do activities where you could fall or get hurt. Do not drive or use machines. Do not drink alcohol. Do not take sleeping pills or medicines that make you drowsy. Do not make big decisions or sign legal documents. Do not take care of children on your own. Medicines  Take over-the-counter and prescription medicines only as told by your doctor. If you were prescribed antibiotics, take them as told by your doctor. Do not stop taking them even if you start to feel better. Eating and drinking Follow instructions from your doctor about what you may eat  and drink. Drink enough fluid to keep your pee (urine) pale yellow. If you vomit, wait a short time. When you can drink without vomiting, try some water, juice, or clear soup. General instructions If you have breathing problems while you sleep (sleep apnea), you may be more likely to have breathing problems after surgery and when you take certain medicines. Ask your doctor when you should wear your breathing device. Wear compression stockings as told by your doctor. Return to your normal activities when your doctor says that it is safe. Your doctor may give  you more instructions. Make sure you know what you can and cannot do. Contact a doctor if: You still feel like you may vomit or are vomiting more than a day after you were given the numbing medicine. You get a rash. You have a fever. Get help right away if: You have a headache that lasts a long time. You have a very bad headache. Your vision is blurry, or you see two of a single object (double vision). You are dizzy or light-headed. You faint. Your arms or legs feel weak, tingle, or are numb for a long time. You have trouble breathing. You cannot pee. These symptoms may be an emergency. Get help right away. Call 911. Do not wait to see if the symptoms will go away. Do not drive yourself to the hospital. This information is not intended to replace advice given to you by your health care provider. Make sure you discuss any questions you have with your health care provider. Document Revised: 06/04/2022 Document Reviewed: 06/04/2022 Elsevier Patient Education  2024 [employer] General Anesthesia, Adult, Care After The following information offers guidance on how to care for yourself after your procedure. Your health care provider may also give you more specific instructions. If you have problems or questions, contact your health care provider. What can I expect after the procedure? After the procedure, it is common for people  to: Have pain or discomfort at the IV site. Have nausea or vomiting. Have a sore throat or hoarseness. Have trouble concentrating. Feel cold or chills. Feel weak, sleepy, or tired (fatigue). Have soreness and body aches. These can affect parts of the body that were not involved in surgery. Follow these instructions at home: For the time period you were told by your health care provider:  Rest. Do not participate in activities where you could fall or become injured. Do not drive or use machinery. Do not drink alcohol. Do not take sleeping pills or medicines that cause drowsiness. Do not make important decisions or sign legal documents. Do not take care of children on your own. General instructions Drink enough fluid to keep your urine pale yellow. If you have sleep apnea, surgery and certain medicines can increase your risk for breathing problems. Follow instructions from your health care provider about wearing your sleep device: Anytime you are sleeping, including during daytime naps. While taking prescription pain medicines, sleeping medicines, or medicines that make you drowsy. Return to your normal activities as told by your health care provider. Ask your health care provider what activities are safe for you. Take over-the-counter and prescription medicines only as told by your health care provider. Do not use any products that contain nicotine or tobacco. These products include cigarettes, chewing tobacco, and vaping devices, such as e-cigarettes. These can delay incision healing after surgery. If you need help quitting, ask your health care provider. Contact a health care provider if: You have nausea or vomiting that does not get better with medicine. You vomit every time you eat or drink. You have pain that does not get better with medicine. You cannot urinate or have bloody urine. You develop a skin rash. You have a fever. Get help right away if: You have trouble  breathing. You have chest pain. You vomit blood. These symptoms may be an emergency. Get help right away. Call 911. Do not wait to see if the symptoms will go away. Do not drive yourself to the hospital. Summary After the procedure, it is common to have a  sore throat, hoarseness, nausea, vomiting, or to feel weak, sleepy, or fatigue. For the time period you were told by your health care provider, do not drive or use machinery. Get help right away if you have difficulty breathing, have chest pain, or vomit blood. These symptoms may be an emergency. This information is not intended to replace advice given to you by your health care provider. Make sure you discuss any questions you have with your health care provider. Document Revised: 01/30/2022 Document Reviewed: 01/30/2022 Elsevier Patient Education  2024 [employer].

## 2023-04-20 ENCOUNTER — Other Ambulatory Visit: Payer: Self-pay

## 2023-04-20 ENCOUNTER — Encounter (HOSPITAL_COMMUNITY): Payer: Self-pay

## 2023-04-20 ENCOUNTER — Encounter (HOSPITAL_COMMUNITY)
Admission: RE | Admit: 2023-04-20 | Discharge: 2023-04-20 | Disposition: A | Discharge status: Home / Self Care | Payer: Medicare PPO | Source: Ambulatory Visit | Attending: Orthopedic Surgery | Admitting: Orthopedic Surgery

## 2023-04-20 VITALS — BP 106/62 | HR 98 | Temp 97.8°F | Resp 18 | Ht 65.0 in | Wt 170.9 lb

## 2023-04-20 DIAGNOSIS — Z01812 Encounter for preprocedural laboratory examination: Secondary | ICD-10-CM | POA: Insufficient documentation

## 2023-04-20 DIAGNOSIS — Z01818 Encounter for other preprocedural examination: Secondary | ICD-10-CM

## 2023-04-20 DIAGNOSIS — E785 Hyperlipidemia, unspecified: Secondary | ICD-10-CM | POA: Insufficient documentation

## 2023-04-20 DIAGNOSIS — M1711 Unilateral primary osteoarthritis, right knee: Secondary | ICD-10-CM | POA: Diagnosis not present

## 2023-04-20 DIAGNOSIS — E1169 Type 2 diabetes mellitus with other specified complication: Secondary | ICD-10-CM | POA: Diagnosis not present

## 2023-04-20 LAB — BASIC METABOLIC PANEL
Anion gap: 8 (ref 5–15)
BUN: 26 mg/dL — ABNORMAL HIGH (ref 8–23)
CO2: 21 mmol/L — ABNORMAL LOW (ref 22–32)
Calcium: 9.4 mg/dL (ref 8.9–10.3)
Chloride: 106 mmol/L (ref 98–111)
Creatinine, Ser: 1.29 mg/dL — ABNORMAL HIGH (ref 0.44–1.00)
GFR, Estimated: 42 mL/min — ABNORMAL LOW (ref >60)
Glucose, Bld: 120 mg/dL — ABNORMAL HIGH (ref 70–99)
Potassium: 4.7 mmol/L (ref 3.5–5.1)
Sodium: 135 mmol/L (ref 135–145)

## 2023-04-20 LAB — CBC WITH DIFFERENTIAL/PLATELET
Abs Immature Granulocytes: 0.02 10*3/uL (ref 0.00–0.07)
Basophils Absolute: 0 10*3/uL (ref 0.0–0.1)
Basophils Relative: 1 %
Eosinophils Absolute: 0.1 10*3/uL (ref 0.0–0.5)
Eosinophils Relative: 1 %
HCT: 36.5 % (ref 36.0–46.0)
Hemoglobin: 11.4 g/dL — ABNORMAL LOW (ref 12.0–15.0)
Immature Granulocytes: 0 %
Lymphocytes Relative: 19 %
Lymphs Abs: 1 10*3/uL (ref 0.7–4.0)
MCH: 27.9 pg (ref 26.0–34.0)
MCHC: 31.2 g/dL (ref 30.0–36.0)
MCV: 89.2 fL (ref 80.0–100.0)
Monocytes Absolute: 0.5 10*3/uL (ref 0.1–1.0)
Monocytes Relative: 9 %
Neutro Abs: 3.6 10*3/uL (ref 1.7–7.7)
Neutrophils Relative %: 70 %
Platelets: 212 10*3/uL (ref 150–400)
RBC: 4.09 MIL/uL (ref 3.87–5.11)
RDW: 15.2 % (ref 11.5–15.5)
WBC: 5.1 10*3/uL (ref 4.0–10.5)
nRBC: 0 % (ref 0.0–0.2)

## 2023-04-20 LAB — SURGICAL PCR SCREEN
MRSA, PCR: NEGATIVE
Staphylococcus aureus: NEGATIVE

## 2023-04-20 LAB — TYPE AND SCREEN: Antibody Screen: NEGATIVE

## 2023-04-20 LAB — PREPARE RBC (CROSSMATCH)

## 2023-04-20 NOTE — Progress Notes (Signed)
PAT visit completed. Pt verbalized understanding on procedure instructions and arrival time.   

## 2023-04-21 LAB — HEMOGLOBIN A1C
Hgb A1c MFr Bld: 6.5 % — ABNORMAL HIGH (ref 4.8–5.6)
Mean Plasma Glucose: 140 mg/dL

## 2023-04-26 ENCOUNTER — Other Ambulatory Visit: Payer: Self-pay | Admitting: Radiology

## 2023-04-26 DIAGNOSIS — M1711 Unilateral primary osteoarthritis, right knee: Secondary | ICD-10-CM

## 2023-04-26 NOTE — H&P (Signed)
History and physical for knee replacement surgery   Chief complaint right knee pain   HPI This is a 77 year old female had arthroscopy of the right knee on October 18, 2020 had a lateral meniscectomy was noted to have arthritis did well until April of this year when she started having pain again.  After injection bracing use of a cane and trying medications she is still having lateral compartment pain and disabling symptoms in her right knee and has opted to proceed with right total knee arthroplasty   ROS: No chest pain or shortness of breath at this time she has had some leg edema.  No Known Allergies  Current Outpatient Medications  Medication Instructions   acetaminophen (TYLENOL) 500 mg, Oral, Every 6 hours PRN   amLODipine-olmesartan (AZOR) 5-40 MG tablet 1 tablet, Oral, Every morning   anastrozole (ARIMIDEX) 1 mg, Oral, Daily   Blood Glucose Calibration (ACCU-CHEK AVIVA) SOLN No dose, route, or frequency recorded.   Blood Glucose Monitoring Suppl (ACCU-CHEK AVIVA PLUS) w/Device KIT No dose, route, or frequency recorded.   calcium carbonate (TUMS EX) 750 MG chewable tablet 2 tablets, Oral, Every morning   cyanocobalamin (VITAMIN B12) 1,000 mcg, Oral, Every evening   fenofibrate 54 mg, Oral, Every evening   Fish Oil 1,000 mg, Oral, Every evening   Gemtesa 75 mg, Oral, Every morning   glucose blood test strip 1 each, 2 times daily, Check Blood sugar 1-2 times daily   metFORMIN (GLUCOPHAGE) 1,000 mg, Oral, 2 times daily with meals   Multiple Vitamin (MULTIVITAMIN WITH MINERALS) TABS tablet 1 tablet, Oral, Every morning, Women's One A Day 50+    omeprazole (PRILOSEC) 10 mg, Oral, Daily before breakfast   simvastatin (ZOCOR) 40 mg, Oral, Daily at bedtime   traMADol (ULTRAM) 25 mg, Oral, 3 times daily PRN   Vitamin D3 1,000 Units, Oral, Every evening       There were no vitals taken for this visit.  There is no height or weight on file to calculate BMI.  General  appearance: Well-developed well-nourished no gross deformities  Cardiovascular normal pulse and perfusion normal color with leg edema  Neurologically no sensation loss or deficits or pathologic reflexes  Psychological: Awake alert and oriented x3 mood and affect normal  Skin no lacerations or ulcerations no nodularity no palpable masses, no erythema or nodularity  Musculoskeletal:  Her upper extremities have normal skin, no deformities, no contractures, no subluxation atrophy or tremor  The right knee is in slight valgus.  She is tender over the lateral compartment.  No current effusion.  Knee flexion arc is less than 120 degrees.  Slight flexion contracture.  Knee is otherwise stable.      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   History of kidney stones    History of radiation therapy 09-10-2015 to [phone]   left breast 50Gy   Iron deficiency anemia    Malignant neoplasm of lower-inner quadrant of left breast in female, estrogen receptor positive [facility]) oncologist-  dr Minta Balsam higgs ([facility])   dx  08-16-2015--  Stage 1A invasisive (T1c,N0,M0), ER/PR+, HER2 negative---- 06-26-2015 left partial mastectomy , 07-31-2015 left axilla node dissection x1 (negative)-- completed radiation 10-08-2015 and started arimidex 10-16-2015   Mixed hyperlipidemia    Nephrolithiasis    bilateral per CT 04-28-2018   Type 2 diabetes mellitus (HCC)    followed by pcp   Ureteral calculi    left   Urgency of urination    Wears glasses     Past Surgical History:  Procedure Laterality Date   ABDOMINAL HYSTERECTOMY  2005    AXILLARY SENTINEL NODE BIOPSY Left 07/31/2015   Procedure: SENTINEL LYMPH NODE BIOPSY, LEFT AXILLA, ;  Surgeon: Franky Macho, MD;  Location: [facility];  Service: General;  Laterality: Left;  Sentinel Node @ 1000   BREAST BIOPSY Left 06/09/2016   Procedure: LEFT BREAST BIOPSY AFTER NEEDLE LOCALIZATION;  Surgeon: Franky Macho, MD;  Location: [facility];  Service: General;  Laterality: Left;   CARPAL TUNNEL RELEASE Left 11/02/2014   Procedure: CARPAL TUNNEL RELEASE;  Surgeon: Dairl Ponder, MD;  Location: [facility];  Service: Orthopedics;  Laterality: Left;   CATARACT EXTRACTION W/ INTRAOCULAR LENS  IMPLANT, BILATERAL  2007   COLONOSCOPY N/A 02/03/2017   Procedure: COLONOSCOPY;  Surgeon: West Bali, MD;  [facility]: [facility];  Service: Endoscopy;  Laterality: N/A;  1245   COLONOSCOPY N/A 03/01/2020   Procedure: COLONOSCOPY;  Surgeon: West Bali, MD;  [facility]: [facility];  Service: Endoscopy;  Laterality: N/A;  10:30am   COLONOSCOPY W/ BIOPSIES AND POLYPECTOMY     CYSTOSCOPY WITH RETROGRADE PYELOGRAM, URETEROSCOPY AND STENT PLACEMENT Left 05/09/2018   Procedure: CYSTOSCOPY WITH RETROGRADE PYELOGRAM, URETEROSCOPY AND STENT PLACEMENT;  Surgeon: Malen Gauze, MD;  [facility];  Service: Urology;  Laterality: Left;   CYSTOSCOPY/URETEROSCOPY/HOLMIUM LASER/STENT PLACEMENT Bilateral 03/18/2017   Procedure: CYSTOSCOPY/ BILATERAL RETROGRADE/RIGHT URETEROSCOPY/ RIGHT HOLMIUM LASER APPLICATION/RIGHT URETERAL STENT PLACEMENT;  Surgeon: Bjorn Pippin, MD;  Location: [facility];  Service: Urology;  Laterality: Bilateral;   EXTRACORPOREAL SHOCK WAVE LITHOTRIPSY Right 12/14/2019   Procedure: EXTRACORPOREAL SHOCK WAVE LITHOTRIPSY (ESWL);  Surgeon: Jerilee Field, MD;  Location: [facility];  Service: Urology;  Laterality: Right;   EXTRACORPOREAL SHOCK WAVE LITHOTRIPSY Left 02/01/2020   Procedure: EXTRACORPOREAL SHOCK WAVE LITHOTRIPSY (ESWL);  Surgeon: Sebastian Ache, MD;   Location: [facility];  Service: Urology;  Laterality: Left;   HOLMIUM LASER APPLICATION Left 05/09/2018   Procedure: HOLMIUM LASER APPLICATION;  Surgeon: Malen Gauze, MD;  Location: [facility];  Service: Urology;  Laterality: Left;   KNEE ARTHROSCOPY WITH LATERAL MENISECTOMY Right 10/18/2020   Procedure: KNEE ARTHROSCOPY WITH LATERAL MENISECTOMY;  Surgeon: Vickki Hearing, MD;  Location: [facility];  Service: Orthopedics;  Laterality: Right;   OPEN REDUCTION INTERNAL FIXATION (ORIF) DISTAL RADIAL FRACTURE Left 11/02/2014   Procedure: OPEN REDUCTION INTERNAL FIXATION (ORIF) DISTAL RADIAL FRACTURE;  Surgeon: Dairl Ponder, MD;  [facility];  Service: Orthopedics;  Laterality: Left;   PARTIAL MASTECTOMY WITH NEEDLE LOCALIZATION Left 06/26/2015   Procedure: PARTIAL MASTECTOMY WITH NEEDLE LOCALIZATION;  Surgeon: Franky Macho, MD;  Location: [facility];  Service: General;  Laterality: Left;  Needle Loc @ 8:00am   POLYPECTOMY  02/03/2017   Procedure: POLYPECTOMY;  Surgeon: West Bali, MD;  [facility];  Service: Endoscopy;;  descending and hepatic flexure   POLYPECTOMY  03/01/2020   Procedure: POLYPECTOMY;  Surgeon: West Bali, MD;  [facility];  Service: Endoscopy;;  ascending;splenic flexure polyps x2;rectal   TARSAL METATARSAL ARTHRODESIS Left 09-07-2007   dr Lestine Box  [facility]   first and second tarsal metatarsal fusion, gastroc slide, neurolysis   TOTAL ABDOMINAL HYSTERECTOMY W/ BILATERAL SALPINGOOPHORECTOMY  10-21-2004   dr soper  [facility]    Family History  Problem Relation Age of Onset   Hypertension Mother    Osteoporosis Mother    Diabetes Sister        x3   Colon cancer Neg Hx    Social History   Tobacco Use   Smoking status: Former    Packs/day: 1.00    Years: 20.00    Additional pack years: 0.00    Total pack years: 20.00    Types: Cigarettes    Quit date: 06/05/1986    Years since quitting: 36.9   Smokeless  tobacco: Never  Vaping Use   Vaping Use: Never used  Substance Use Topics   Alcohol use: No   Drug use: No    No Known Allergies  Current Facility-Administered Medications for the 04/27/23 encounter [facility] Encounter)  Medication   bupivacaine-meloxicam ER (ZYNRELEF) injection 400 mg   Current Meds  Medication Sig   acetaminophen (TYLENOL) 500 MG tablet Take 500 mg by mouth every 6 (six) hours as needed (pain.).   amLODipine-olmesartan (AZOR) 5-40 MG tablet Take 1 tablet by mouth in the morning.   anastrozole (ARIMIDEX) 1 MG tablet Take 1 tablet (1 mg total) by mouth daily.   calcium carbonate (TUMS EX) 750 MG chewable tablet Chew 2 tablets by mouth in the morning.   Cholecalciferol (VITAMIN D3) 1000 units CAPS Take 1,000 Units by mouth every evening.   cyanocobalamin (VITAMIN B12) 1000 MCG tablet Take 1,000 mcg by mouth every evening.   fenofibrate 54 MG tablet Take 54 mg by mouth every evening.   GEMTESA 75 MG TABS Take 75 mg by mouth in the morning.   metFORMIN (GLUCOPHAGE) 1000 MG tablet Take 1,000 mg by mouth 2 (two) times daily with a meal.    Multiple Vitamin (MULTIVITAMIN WITH MINERALS) TABS tablet Take 1 tablet by mouth every morning. Women's One A Day 50+   Omega-3 Fatty Acids (FISH OIL) 1000 MG CAPS Take 1,000 mg by mouth every evening.   omeprazole (PRILOSEC) 10 MG capsule Take 10 mg by mouth daily before breakfast.   simvastatin (ZOCOR) 40 MG tablet Take 40 mg by mouth at bedtime.    traMADol (ULTRAM) 50 MG tablet Take 25 mg by mouth 3 (three) times daily as needed (pain.).     Diagnosis osteoarthritis right knee  The procedure has been fully reviewed with the patient; The risks and benefits of surgery have been discussed and explained and understood. Alternative treatment has also been reviewed, questions were encouraged and answered. The postoperative plan is also been reviewed.   Plan right total knee arthroplasty     Latest Ref Rng & Units 04/20/2023   10:25  AM 08/10/2022    8:18 AM 08/06/2022   12:32 PM  BMP  Glucose 70 - 99 mg/dL 865  784  696   BUN 8 - 23 mg/dL 26  27  26    Creatinine 0.44 - 1.00 mg/dL 2.95  2.84  1.32   Sodium 135 - 145 mmol/L 135  142  139   Potassium 3.5 - 5.1 mmol/L 4.7  4.8  5.2   Chloride 98 - 111 mmol/L 106  110  108   CO2 22 - 32 mmol/L 21   22  Calcium 8.9 - 10.3 mg/dL 9.4   9.9        Latest Ref Rng & Units 04/20/2023   10:25 AM 08/10/2022    8:18 AM 08/06/2022   12:32 PM  CBC  WBC 4.0 - 10.5 K/uL 5.1   6.0   Hemoglobin 12.0 - 15.0 g/dL 16.1  09.6  04.5   Hematocrit 36.0 - 46.0 % 36.5  36.0  38.1   Platelets 150 - 400 K/uL 212   195      Fuller Canada, MD  04/26/2023 2:08 PM

## 2023-04-27 ENCOUNTER — Encounter (HOSPITAL_COMMUNITY): Payer: Self-pay | Admitting: Orthopedic Surgery

## 2023-04-27 ENCOUNTER — Observation Stay (HOSPITAL_COMMUNITY)
Admission: RE | Admit: 2023-04-27 | Discharge: 2023-04-28 | Disposition: A | Discharge status: Home / Self Care | Payer: Medicare PPO | Attending: Orthopedic Surgery | Admitting: Orthopedic Surgery

## 2023-04-27 ENCOUNTER — Encounter (HOSPITAL_COMMUNITY)
Admission: RE | Disposition: A | Discharge status: Home / Self Care | Payer: Self-pay | Source: Home / Self Care | Attending: Orthopedic Surgery

## 2023-04-27 ENCOUNTER — Ambulatory Visit (HOSPITAL_COMMUNITY): Payer: Medicare PPO

## 2023-04-27 ENCOUNTER — Ambulatory Visit (HOSPITAL_COMMUNITY): Payer: Medicare PPO | Admitting: Anesthesiology

## 2023-04-27 ENCOUNTER — Other Ambulatory Visit: Payer: Self-pay

## 2023-04-27 DIAGNOSIS — I10 Essential (primary) hypertension: Secondary | ICD-10-CM

## 2023-04-27 DIAGNOSIS — Z87891 Personal history of nicotine dependence: Secondary | ICD-10-CM | POA: Insufficient documentation

## 2023-04-27 DIAGNOSIS — Z853 Personal history of malignant neoplasm of breast: Secondary | ICD-10-CM | POA: Diagnosis not present

## 2023-04-27 DIAGNOSIS — E1122 Type 2 diabetes mellitus with diabetic chronic kidney disease: Secondary | ICD-10-CM | POA: Diagnosis not present

## 2023-04-27 DIAGNOSIS — Z7984 Long term (current) use of oral hypoglycemic drugs: Secondary | ICD-10-CM | POA: Diagnosis not present

## 2023-04-27 DIAGNOSIS — Z8543 Personal history of malignant neoplasm of ovary: Secondary | ICD-10-CM | POA: Insufficient documentation

## 2023-04-27 DIAGNOSIS — M1711 Unilateral primary osteoarthritis, right knee: Secondary | ICD-10-CM

## 2023-04-27 DIAGNOSIS — E1149 Type 2 diabetes mellitus with other diabetic neurological complication: Secondary | ICD-10-CM

## 2023-04-27 DIAGNOSIS — N183 Chronic kidney disease, stage 3 unspecified: Secondary | ICD-10-CM | POA: Diagnosis not present

## 2023-04-27 DIAGNOSIS — I129 Hypertensive chronic kidney disease with stage 1 through stage 4 chronic kidney disease, or unspecified chronic kidney disease: Secondary | ICD-10-CM | POA: Insufficient documentation

## 2023-04-27 DIAGNOSIS — Z79899 Other long term (current) drug therapy: Secondary | ICD-10-CM | POA: Insufficient documentation

## 2023-04-27 HISTORY — PX: TOTAL KNEE ARTHROPLASTY: SHX125

## 2023-04-27 LAB — ABO/RH: ABO/RH(D): O POS

## 2023-04-27 LAB — TYPE AND SCREEN: Unit division: 0

## 2023-04-27 LAB — GLUCOSE, CAPILLARY
Glucose-Capillary: 112 mg/dL — ABNORMAL HIGH (ref 70–99)
Glucose-Capillary: 118 mg/dL — ABNORMAL HIGH (ref 70–99)
Glucose-Capillary: 121 mg/dL — ABNORMAL HIGH (ref 70–99)
Glucose-Capillary: 173 mg/dL — ABNORMAL HIGH (ref 70–99)
Glucose-Capillary: 154 mg/dL — ABNORMAL HIGH (ref 70–99)

## 2023-04-27 LAB — BPAM RBC: Blood Product Expiration Date: 202407122359

## 2023-04-27 SURGERY — ARTHROPLASTY, KNEE, TOTAL
Anesthesia: Spinal | Site: Knee | Laterality: Right

## 2023-04-27 MED ORDER — LACTATED RINGERS IV SOLN: INTRAVENOUS | Status: DC

## 2023-04-27 MED ORDER — ROPIVACAINE HCL 5 MG/ML IJ SOLN
INTRAMUSCULAR | Status: AC
  Filled 2023-04-27: qty 30

## 2023-04-27 MED ORDER — CEFAZOLIN SODIUM-DEXTROSE 2-4 GM/100ML-% IV SOLN
INTRAVENOUS | Status: AC
  Filled 2023-04-27: qty 100

## 2023-04-27 MED ORDER — ACETAMINOPHEN 325 MG PO TABS: 325.0000 mg | ORAL_TABLET | Freq: Four times a day (QID) | ORAL | Status: DC | PRN

## 2023-04-27 MED ORDER — METHOCARBAMOL 500 MG PO TABS: 500.0000 mg | ORAL_TABLET | Freq: Four times a day (QID) | ORAL | Status: DC | PRN

## 2023-04-27 MED ORDER — ONDANSETRON HCL 4 MG PO TABS: 4.0000 mg | ORAL_TABLET | Freq: Four times a day (QID) | ORAL | Status: DC | PRN

## 2023-04-27 MED ORDER — CHLORHEXIDINE GLUCONATE 0.12 % MT SOLN
15.0000 mL | Freq: Once | OROMUCOSAL | Status: AC
  Administered 2023-04-27: 15 mL via OROMUCOSAL

## 2023-04-27 MED ORDER — TRANEXAMIC ACID-NACL 1000-0.7 MG/100ML-% IV SOLN
1000.0000 mg | Freq: Once | INTRAVENOUS | Status: AC
  Administered 2023-04-27: 1000 mg via INTRAVENOUS
  Filled 2023-04-27 (×2): qty 100

## 2023-04-27 MED ORDER — ATORVASTATIN CALCIUM 20 MG PO TABS
20.0000 mg | ORAL_TABLET | Freq: Every day | ORAL | Status: DC
  Administered 2023-04-27: 20 mg via ORAL
  Filled 2023-04-27: qty 1

## 2023-04-27 MED ORDER — BUPIVACAINE-MELOXICAM ER 200-6 MG/7ML IJ SOLN
INTRAMUSCULAR | Status: AC
  Filled 2023-04-27: qty 2

## 2023-04-27 MED ORDER — AMLODIPINE-OLMESARTAN 5-40 MG PO TABS: 1.0000 | ORAL_TABLET | Freq: Every morning | ORAL | Status: DC

## 2023-04-27 MED ORDER — TRANEXAMIC ACID-NACL 1000-0.7 MG/100ML-% IV SOLN
INTRAVENOUS | Status: AC
  Filled 2023-04-27: qty 100

## 2023-04-27 MED ORDER — FENTANYL CITRATE (PF) 100 MCG/2ML IJ SOLN
INTRAMUSCULAR | Status: DC | PRN
  Administered 2023-04-27: 25 ug via INTRATHECAL

## 2023-04-27 MED ORDER — CEFAZOLIN SODIUM-DEXTROSE 2-4 GM/100ML-% IV SOLN
2.0000 g | INTRAVENOUS | Status: AC
  Administered 2023-04-27: 2 g via INTRAVENOUS

## 2023-04-27 MED ORDER — POVIDONE-IODINE 10 % EX SWAB: 2.0000 | Freq: Once | CUTANEOUS | Status: DC

## 2023-04-27 MED ORDER — ACETAMINOPHEN 500 MG PO TABS
500.0000 mg | ORAL_TABLET | Freq: Four times a day (QID) | ORAL | Status: AC
  Administered 2023-04-27 – 2023-04-28 (×4): 500 mg via ORAL
  Filled 2023-04-27 (×4): qty 1

## 2023-04-27 MED ORDER — DEXAMETHASONE SODIUM PHOSPHATE 10 MG/ML IJ SOLN
INTRAMUSCULAR | Status: DC | PRN
  Administered 2023-04-27: 5 mg via INTRAVENOUS

## 2023-04-27 MED ORDER — ONDANSETRON HCL 4 MG/2ML IJ SOLN
INTRAMUSCULAR | Status: DC | PRN
  Administered 2023-04-27: 4 mg via INTRAVENOUS

## 2023-04-27 MED ORDER — HYDROCODONE-ACETAMINOPHEN 5-325 MG PO TABS
1.0000 | ORAL_TABLET | ORAL | Status: DC | PRN
  Administered 2023-04-27: 1 via ORAL
  Filled 2023-04-27: qty 1

## 2023-04-27 MED ORDER — DEXMEDETOMIDINE HCL IN NACL 80 MCG/20ML IV SOLN
INTRAVENOUS | Status: AC
  Filled 2023-04-27: qty 20

## 2023-04-27 MED ORDER — PROPOFOL 10 MG/ML IV BOLUS
INTRAVENOUS | Status: AC
  Filled 2023-04-27: qty 20

## 2023-04-27 MED ORDER — ONDANSETRON HCL 4 MG/2ML IJ SOLN: 4.0000 mg | Freq: Four times a day (QID) | INTRAMUSCULAR | Status: DC | PRN

## 2023-04-27 MED ORDER — IRBESARTAN 150 MG PO TABS
300.0000 mg | ORAL_TABLET | Freq: Every day | ORAL | Status: DC
  Administered 2023-04-28: 300 mg via ORAL
  Filled 2023-04-27: qty 2

## 2023-04-27 MED ORDER — ALUM & MAG HYDROXIDE-SIMETH 200-200-20 MG/5ML PO SUSP: 30.0000 mL | ORAL | Status: DC | PRN

## 2023-04-27 MED ORDER — PROPOFOL 500 MG/50ML IV EMUL
INTRAVENOUS | Status: AC
  Filled 2023-04-27: qty 100

## 2023-04-27 MED ORDER — METOCLOPRAMIDE HCL 5 MG/ML IJ SOLN: 5.0000 mg | Freq: Three times a day (TID) | INTRAMUSCULAR | Status: DC | PRN

## 2023-04-27 MED ORDER — FENTANYL CITRATE (PF) 100 MCG/2ML IJ SOLN
INTRAMUSCULAR | Status: DC | PRN
  Administered 2023-04-27 (×3): 25 ug via INTRAVENOUS

## 2023-04-27 MED ORDER — CHLORHEXIDINE GLUCONATE 0.12 % MT SOLN
OROMUCOSAL | Status: AC
  Filled 2023-04-27: qty 15

## 2023-04-27 MED ORDER — ORAL CARE MOUTH RINSE: 15.0000 mL | Freq: Once | OROMUCOSAL | Status: AC

## 2023-04-27 MED ORDER — ROPIVACAINE HCL 5 MG/ML IJ SOLN
INTRAMUSCULAR | Status: DC | PRN
  Administered 2023-04-27: 27 mL via PERINEURAL

## 2023-04-27 MED ORDER — METOCLOPRAMIDE HCL 5 MG PO TABS: 5.0000 mg | ORAL_TABLET | Freq: Three times a day (TID) | ORAL | Status: DC | PRN

## 2023-04-27 MED ORDER — ASPIRIN 81 MG PO CHEW
81.0000 mg | CHEWABLE_TABLET | Freq: Two times a day (BID) | ORAL | Status: DC
  Administered 2023-04-27 – 2023-04-28 (×2): 81 mg via ORAL
  Filled 2023-04-27 (×2): qty 1

## 2023-04-27 MED ORDER — PHENYLEPHRINE HCL (PRESSORS) 10 MG/ML IV SOLN
INTRAVENOUS | Status: DC | PRN
  Administered 2023-04-27: 80 ug via INTRAVENOUS

## 2023-04-27 MED ORDER — SODIUM CHLORIDE 0.9 % IR SOLN
Status: DC | PRN
  Administered 2023-04-27: 3000 mL

## 2023-04-27 MED ORDER — OMEGA-3-ACID ETHYL ESTERS 1 G PO CAPS
1.0000 g | ORAL_CAPSULE | Freq: Every day | ORAL | Status: DC
  Administered 2023-04-27 – 2023-04-28 (×2): 1 g via ORAL
  Filled 2023-04-27 (×2): qty 1

## 2023-04-27 MED ORDER — BISACODYL 5 MG PO TBEC: 5.0000 mg | DELAYED_RELEASE_TABLET | Freq: Every day | ORAL | Status: DC | PRN

## 2023-04-27 MED ORDER — ADULT MULTIVITAMIN W/MINERALS CH
1.0000 | ORAL_TABLET | Freq: Every morning | ORAL | Status: DC
  Administered 2023-04-27 – 2023-04-28 (×2): 1 via ORAL
  Filled 2023-04-27 (×2): qty 1

## 2023-04-27 MED ORDER — DEXAMETHASONE SODIUM PHOSPHATE 10 MG/ML IJ SOLN
INTRAMUSCULAR | Status: AC
  Filled 2023-04-27: qty 2

## 2023-04-27 MED ORDER — HYDROCODONE-ACETAMINOPHEN 7.5-325 MG PO TABS: 1.0000 | ORAL_TABLET | ORAL | Status: DC | PRN

## 2023-04-27 MED ORDER — TRANEXAMIC ACID-NACL 1000-0.7 MG/100ML-% IV SOLN
1000.0000 mg | INTRAVENOUS | Status: AC
  Administered 2023-04-27: 1000 mg via INTRAVENOUS

## 2023-04-27 MED ORDER — PHENOL 1.4 % MT LIQD: 1.0000 | OROMUCOSAL | Status: DC | PRN

## 2023-04-27 MED ORDER — DIPHENHYDRAMINE HCL 12.5 MG/5ML PO ELIX: 12.5000 mg | ORAL_SOLUTION | ORAL | Status: DC | PRN

## 2023-04-27 MED ORDER — 0.9 % SODIUM CHLORIDE (POUR BTL) OPTIME
TOPICAL | Status: DC | PRN
  Administered 2023-04-27: 1000 mL

## 2023-04-27 MED ORDER — CALCIUM CARBONATE ANTACID 500 MG PO CHEW
2.0000 | CHEWABLE_TABLET | Freq: Every morning | ORAL | Status: DC
  Administered 2023-04-28: 400 mg via ORAL
  Filled 2023-04-27 (×2): qty 2

## 2023-04-27 MED ORDER — PHENYLEPHRINE 80 MCG/ML (10ML) SYRINGE FOR IV PUSH (FOR BLOOD PRESSURE SUPPORT)
PREFILLED_SYRINGE | INTRAVENOUS | Status: AC
  Filled 2023-04-27: qty 10

## 2023-04-27 MED ORDER — PANTOPRAZOLE SODIUM 40 MG PO TBEC
40.0000 mg | DELAYED_RELEASE_TABLET | Freq: Every day | ORAL | Status: DC
  Administered 2023-04-28: 40 mg via ORAL
  Filled 2023-04-27 (×2): qty 1

## 2023-04-27 MED ORDER — ONDANSETRON HCL 4 MG/2ML IJ SOLN: 4.0000 mg | Freq: Once | INTRAMUSCULAR | Status: DC | PRN

## 2023-04-27 MED ORDER — VITAMIN B-12 1000 MCG PO TABS
1000.0000 ug | ORAL_TABLET | Freq: Every evening | ORAL | Status: DC
  Administered 2023-04-27: 1000 ug via ORAL
  Filled 2023-04-27: qty 1

## 2023-04-27 MED ORDER — DOCUSATE SODIUM 100 MG PO CAPS
100.0000 mg | ORAL_CAPSULE | Freq: Two times a day (BID) | ORAL | Status: DC
  Administered 2023-04-27 – 2023-04-28 (×3): 100 mg via ORAL
  Filled 2023-04-27 (×3): qty 1

## 2023-04-27 MED ORDER — DEXAMETHASONE SODIUM PHOSPHATE 4 MG/ML IJ SOLN
INTRAMUSCULAR | Status: DC | PRN
  Administered 2023-04-27: 8 mg via PERINEURAL

## 2023-04-27 MED ORDER — ONDANSETRON HCL 4 MG/2ML IJ SOLN
INTRAMUSCULAR | Status: AC
  Filled 2023-04-27: qty 2

## 2023-04-27 MED ORDER — BUPIVACAINE-MELOXICAM ER 200-6 MG/7ML IJ SOLN
INTRAMUSCULAR | Status: DC | PRN
  Administered 2023-04-27: 400 mg

## 2023-04-27 MED ORDER — PROPOFOL 10 MG/ML IV BOLUS
INTRAVENOUS | Status: DC | PRN
  Administered 2023-04-27 (×2): 20 mg via INTRAVENOUS

## 2023-04-27 MED ORDER — HYDROMORPHONE HCL 1 MG/ML IJ SOLN: 0.2500 mg | INTRAMUSCULAR | Status: DC | PRN

## 2023-04-27 MED ORDER — VITAMIN D 25 MCG (1000 UNIT) PO TABS
1000.0000 [IU] | ORAL_TABLET | Freq: Every evening | ORAL | Status: DC
  Administered 2023-04-27: 1000 [IU] via ORAL
  Filled 2023-04-27: qty 1

## 2023-04-27 MED ORDER — CEFAZOLIN SODIUM-DEXTROSE 2-4 GM/100ML-% IV SOLN
2.0000 g | Freq: Four times a day (QID) | INTRAVENOUS | Status: AC
  Administered 2023-04-27 (×2): 2 g via INTRAVENOUS
  Filled 2023-04-27 (×2): qty 100

## 2023-04-27 MED ORDER — DEXMEDETOMIDINE HCL IN NACL 80 MCG/20ML IV SOLN
INTRAVENOUS | Status: DC | PRN
  Administered 2023-04-27 (×3): 4 ug via INTRAVENOUS

## 2023-04-27 MED ORDER — PROPOFOL 500 MG/50ML IV EMUL
INTRAVENOUS | Status: DC | PRN
  Administered 2023-04-27: 10 ug/kg/min via INTRAVENOUS

## 2023-04-27 MED ORDER — AMLODIPINE BESYLATE 5 MG PO TABS
5.0000 mg | ORAL_TABLET | Freq: Every day | ORAL | Status: DC
  Administered 2023-04-28: 5 mg via ORAL
  Filled 2023-04-27 (×2): qty 1

## 2023-04-27 MED ORDER — POLYETHYLENE GLYCOL 3350 17 G PO PACK
17.0000 g | PACK | Freq: Every day | ORAL | Status: DC
  Administered 2023-04-27: 17 g via ORAL
  Filled 2023-04-27 (×2): qty 1

## 2023-04-27 MED ORDER — DEXAMETHASONE SODIUM PHOSPHATE 4 MG/ML IJ SOLN
INTRAMUSCULAR | Status: AC
  Filled 2023-04-27: qty 2

## 2023-04-27 MED ORDER — FENOFIBRATE 54 MG PO TABS
54.0000 mg | ORAL_TABLET | Freq: Every evening | ORAL | Status: DC
  Administered 2023-04-27: 54 mg via ORAL
  Filled 2023-04-27 (×3): qty 1

## 2023-04-27 MED ORDER — LIDOCAINE HCL (PF) 1 % IJ SOLN
INTRAMUSCULAR | Status: AC
  Filled 2023-04-27: qty 30

## 2023-04-27 MED ORDER — MIDAZOLAM HCL 2 MG/2ML IJ SOLN
2.0000 mg | Freq: Once | INTRAMUSCULAR | Status: AC
  Administered 2023-04-27: 1 mg via INTRAVENOUS
  Filled 2023-04-27: qty 2

## 2023-04-27 MED ORDER — METFORMIN HCL 500 MG PO TABS
1000.0000 mg | ORAL_TABLET | Freq: Two times a day (BID) | ORAL | Status: DC
  Administered 2023-04-27 – 2023-04-28 (×2): 1000 mg via ORAL
  Filled 2023-04-27 (×2): qty 2

## 2023-04-27 MED ORDER — CELECOXIB 100 MG PO CAPS
200.0000 mg | ORAL_CAPSULE | Freq: Two times a day (BID) | ORAL | Status: DC
  Administered 2023-04-27 – 2023-04-28 (×3): 200 mg via ORAL
  Filled 2023-04-27 (×3): qty 2

## 2023-04-27 MED ORDER — LIDOCAINE HCL (PF) 1 % IJ SOLN
INTRAMUSCULAR | Status: DC | PRN
  Administered 2023-04-27: 3 mL

## 2023-04-27 MED ORDER — EPHEDRINE 5 MG/ML INJ
INTRAVENOUS | Status: AC
  Filled 2023-04-27: qty 5

## 2023-04-27 MED ORDER — BUPIVACAINE IN DEXTROSE 0.75-8.25 % IT SOLN
INTRATHECAL | Status: DC | PRN
  Administered 2023-04-27: 15 mg via INTRATHECAL

## 2023-04-27 MED ORDER — SODIUM CHLORIDE 0.9 % IV SOLN: INTRAVENOUS | Status: DC

## 2023-04-27 MED ORDER — LIDOCAINE HCL (PF) 2 % IJ SOLN
INTRAMUSCULAR | Status: AC
  Filled 2023-04-27: qty 5

## 2023-04-27 MED ORDER — SIMVASTATIN 40 MG PO TABS: 40.0000 mg | ORAL_TABLET | Freq: Every day | ORAL | Status: DC

## 2023-04-27 MED ORDER — ANASTROZOLE 1 MG PO TABS
1.0000 mg | ORAL_TABLET | Freq: Every day | ORAL | Status: DC
  Administered 2023-04-27 – 2023-04-28 (×2): 1 mg via ORAL
  Filled 2023-04-27 (×4): qty 1

## 2023-04-27 MED ORDER — MENTHOL 3 MG MT LOZG: 1.0000 | LOZENGE | OROMUCOSAL | Status: DC | PRN

## 2023-04-27 MED ORDER — MIRABEGRON ER 25 MG PO TB24
25.0000 mg | ORAL_TABLET | Freq: Every day | ORAL | Status: DC
  Administered 2023-04-28: 25 mg via ORAL
  Filled 2023-04-27 (×2): qty 1

## 2023-04-27 MED ORDER — METHOCARBAMOL 1000 MG/10ML IJ SOLN: 500.0000 mg | Freq: Four times a day (QID) | INTRAVENOUS | Status: DC | PRN

## 2023-04-27 MED ORDER — INSULIN ASPART 100 UNIT/ML IJ SOLN
0.0000 [IU] | Freq: Three times a day (TID) | INTRAMUSCULAR | Status: DC
  Administered 2023-04-27: 2 [IU] via SUBCUTANEOUS
  Administered 2023-04-27: 1 [IU] via SUBCUTANEOUS

## 2023-04-27 MED ORDER — FENTANYL CITRATE (PF) 100 MCG/2ML IJ SOLN
INTRAMUSCULAR | Status: AC
  Filled 2023-04-27: qty 2

## 2023-04-27 MED ORDER — MORPHINE SULFATE (PF) 2 MG/ML IV SOLN: 0.5000 mg | INTRAVENOUS | Status: DC | PRN

## 2023-04-27 SURGICAL SUPPLY — 59 items
ATTUNE PS FEM RT SZ 5 CEM KNEE (Femur) IMPLANT
BANDAGE ESMARK 6X9 LF (GAUZE/BANDAGES/DRESSINGS) ×2 IMPLANT
BASEPLATE TIB CMT FB PCKT SZ4 (Stem) IMPLANT
BIT DRILL 3.2X128 (BIT) IMPLANT
BLADE SAGITTAL 25.0X1.27X90 (BLADE) ×2 IMPLANT
BLADE SAW SGTL 11.0X1.19X90.0M (BLADE) ×2 IMPLANT
BLADE SURG SZ10 CARB STEEL (BLADE) ×2 IMPLANT
BNDG CMPR 9X6 STRL LF SNTH (GAUZE/BANDAGES/DRESSINGS) ×1
BNDG ESMARK 6X9 LF (GAUZE/BANDAGES/DRESSINGS) ×1
BSPLAT TIB 4 CMNT FX BRNG STRL (Stem) ×1 IMPLANT
CEMENT HV SMART SET (Cement) ×4 IMPLANT
CLOTH BEACON ORANGE TIMEOUT ST (SAFETY) ×2 IMPLANT
COOLER ICEMAN CLASSIC (MISCELLANEOUS) ×2 IMPLANT
COVER LIGHT HANDLE STERIS (MISCELLANEOUS) ×4 IMPLANT
CUFF TOURN SGL QUICK 34 (TOURNIQUET CUFF) ×1
CUFF TRNQT CYL 34X4.125X (TOURNIQUET CUFF) ×2 IMPLANT
DRAPE BACK TABLE (DRAPES) ×2 IMPLANT
DRAPE EXTREMITY T 121X128X90 (DISPOSABLE) ×2 IMPLANT
DRESSING AQUACEL AG ADV 3.5X12 (MISCELLANEOUS) ×2 IMPLANT
DRSG AQUACEL AG ADV 3.5X12 (MISCELLANEOUS) ×1
DURAPREP 26ML APPLICATOR (WOUND CARE) ×4 IMPLANT
ELECT REM PT RETURN 9FT ADLT (ELECTROSURGICAL) ×1
ELECTRODE REM PT RTRN 9FT ADLT (ELECTROSURGICAL) ×2 IMPLANT
GLOVE BIOGEL PI IND STRL 7.0 (GLOVE) ×6 IMPLANT
GLOVE BIOGEL PI IND STRL 8.5 (GLOVE) ×2 IMPLANT
GLOVE ECLIPSE 7.0 STRL STRAW (GLOVE) IMPLANT
GLOVE SKINSENSE STRL SZ8.0 LF (GLOVE) ×2 IMPLANT
GOWN STRL REUS W/TWL LRG LVL3 (GOWN DISPOSABLE) ×6 IMPLANT
GOWN STRL REUS W/TWL XL LVL3 (GOWN DISPOSABLE) ×2 IMPLANT
HANDPIECE INTERPULSE COAX TIP (DISPOSABLE) ×1
HOOD W/PEELAWAY (MISCELLANEOUS) ×8 IMPLANT
INSERT TIB ATTUNE FB SZ5X6 (Insert) IMPLANT
INST SET MAJOR BONE (KITS) ×2 IMPLANT
IV NS IRRIG 3000ML ARTHROMATIC (IV SOLUTION) ×2 IMPLANT
KIT BLADEGUARD II DBL (SET/KITS/TRAYS/PACK) ×2 IMPLANT
KIT TURNOVER KIT A (KITS) ×2 IMPLANT
MANIFOLD NEPTUNE II (INSTRUMENTS) ×2 IMPLANT
MARKER SKIN DUAL TIP RULER LAB (MISCELLANEOUS) ×2 IMPLANT
NS IRRIG 1000ML POUR BTL (IV SOLUTION) ×2 IMPLANT
PACK TOTAL JOINT (CUSTOM PROCEDURE TRAY) ×2 IMPLANT
PAD ARMBOARD 7.5X6 YLW CONV (MISCELLANEOUS) ×2 IMPLANT
PAD COLD SHLDR SM WRAP-ON (PAD) ×2 IMPLANT
PATELLA MEDIAL ATTUN 35MM KNEE (Knees) IMPLANT
PILLOW KNEE EXTENSION 0 DEG (MISCELLANEOUS) ×2 IMPLANT
SAW OSC TIP CART 19.5X105X1.3 (SAW) ×2 IMPLANT
SET BASIN LINEN APH (SET/KITS/TRAYS/PACK) ×2 IMPLANT
SET HNDPC FAN SPRY TIP SCT (DISPOSABLE) ×2 IMPLANT
SOLUTION IRRIG SURGIPHOR (IV SOLUTION) ×2 IMPLANT
SPONGE T-LAP 18X18 ~~LOC~~+RFID (SPONGE) IMPLANT
STAPLER VISISTAT 35W (STAPLE) ×2 IMPLANT
SUT BRALON NAB BRD #1 30IN (SUTURE) ×2 IMPLANT
SUT MNCRL 0 VIOLET CTX 36 (SUTURE) ×2 IMPLANT
SUT MON AB 0 CT1 (SUTURE) ×2 IMPLANT
SYR BULB IRRIG 60ML STRL (SYRINGE) ×2 IMPLANT
TOWEL OR 17X26 4PK STRL BLUE (TOWEL DISPOSABLE) ×2 IMPLANT
TOWER CARTRIDGE SMART MIX (DISPOSABLE) ×2 IMPLANT
TRAY FOLEY MTR SLVR 16FR STAT (SET/KITS/TRAYS/PACK) ×2 IMPLANT
WATER STERILE IRR 1000ML POUR (IV SOLUTION) ×4 IMPLANT
YANKAUER SUCT 12FT TUBE ARGYLE (SUCTIONS) ×2 IMPLANT

## 2023-04-27 NOTE — Brief Op Note (Signed)
04/27/2023  10:14 AM  PATIENT:  Leonides Grills  77 y.o. female  PRE-OPERATIVE DIAGNOSIS:  right knee osteoarthritis  POST-OPERATIVE DIAGNOSIS:  right knee osteoarthritis  PROCEDURE:  Procedure(s): TOTAL KNEE ARTHROPLASTY (Right)  SURGEON:  Surgeon(s) and Role:    Vickki Hearing, MD - Primary  PHYSICIAN ASSISTANT:   ASSISTANTS: cynthia wrenn   ANESTHESIA:   spinal and saphenous nerve block   EBL:  10 mL   BLOOD ADMINISTERED:none  DRAINS: none   LOCAL MEDICATIONS USED:  OTHER zinrelef  SPECIMEN:  No Specimen  DISPOSITION OF SPECIMEN:  N/A  COUNTS:  YES  TOURNIQUET:  * Missing tourniquet times found for documented tourniquets in log: [id number] *  DICTATION: .Reubin Milan Dictation  PLAN OF CARE: Admit for overnight observation  PATIENT DISPOSITION:  PACU - hemodynamically stable.   Delay start of Pharmacological VTE agent (>24hrs) due to surgical blood loss or risk of bleeding: yes

## 2023-04-27 NOTE — Anesthesia Procedure Notes (Signed)
Date/Time: 04/27/2023 8:01 AM  Performed by: Franco Nones, CRNAPre-anesthesia Checklist: Patient identified, Emergency Drugs available, Suction available, Timeout performed and Patient being monitored Patient Re-evaluated:Patient Re-evaluated prior to induction Oxygen Delivery Method: Simple face mask

## 2023-04-27 NOTE — Interval H&P Note (Signed)
History and Physical Interval Note:  04/27/2023 7:10 AM  [name]  has presented today for surgery, with the diagnosis of right knee osteoarthritis.  The various methods of treatment have been discussed with the patient and family. After consideration of risks, benefits and other options for treatment, the patient has consented to  Procedure(s): TOTAL KNEE ARTHROPLASTY (Right) as a surgical intervention.  The patient's history has been reviewed, patient examined, no change in status, stable for surgery.  I have reviewed the patient's chart and labs.  Questions were answered to the patient's satisfaction.     Fuller Canada

## 2023-04-27 NOTE — Plan of Care (Signed)
  Problem: Acute Rehab PT Goals(only PT should resolve) Goal: Pt Will Go Supine/Side To Sit Outcome: Progressing Flowsheets (Taken 04/27/2023 1537) Pt will go Supine/Side to Sit:  with modified independence  with supervision Goal: Patient Will Transfer Sit To/From Stand Outcome: Progressing Flowsheets (Taken 04/27/2023 1537) Patient will transfer sit to/from stand:  with modified independence  with supervision Goal: Pt Will Transfer Bed To Chair/Chair To Bed Outcome: Progressing Flowsheets (Taken 04/27/2023 1537) Pt will Transfer Bed to Chair/Chair to Bed:  with modified independence  with supervision Goal: Pt Will Ambulate Outcome: Progressing Flowsheets (Taken 04/27/2023 1537) Pt will Ambulate:  75 feet  with supervision  with modified independence  with rolling walker   3:37 PM, 04/27/23 Ocie Bob, MPT Physical Therapist with [facility] [phone] office [phone] mobile phone

## 2023-04-27 NOTE — Evaluation (Signed)
Physical Therapy Evaluation Patient Details Name: [name] MRN: [id number] DOB: 1946/08/15 Today's Date: 04/27/2023   RIGHT KNEE ROM:  0 - 100 degrees AMBULATION DISTANCE: 35 feet using RW with Min Guard Assist   History of Present Illness  [name] is a 77 y/o female, s/p Right TKA on 04/27/23, with the diagnosis of right knee osteoarthritis.  Clinical Impression  Patient instructed and give written HEP with fair/good return demonstrated and understanding acknowledged, demonstrates labored movement for sitting up at bedside and completing transfers with c/o dizziness that resolved after sitting for a few minutes and able to ambulate out of room with fair carryover for right heel to toe stepping due to increased right knee pain.  Patient tolerated sitting up in chair  with RLE dangling after therapy with family members present.  Patient will benefit from continued skilled physical therapy in hospital and recommended venue below to increase strength, balance, endurance for safe ADLs and gait.         Recommendations for follow up therapy are one component of a multi-disciplinary discharge planning process, led by the attending physician.  Recommendations may be updated based on patient status, additional functional criteria and insurance authorization.  Follow Up Recommendations       Assistance Recommended at Discharge Set up Supervision/Assistance  Patient can return home with the following  A little help with walking and/or transfers;A little help with bathing/dressing/bathroom;Help with stairs or ramp for entrance;Assistance with cooking/housework    Equipment Recommendations None recommended by PT  Recommendations for Other Services       Functional Status Assessment Patient has had a recent decline in their functional status and demonstrates the ability to make significant improvements in function in a reasonable and predictable amount of time.     Precautions /  Restrictions Precautions Precautions: Fall Restrictions Weight Bearing Restrictions: Yes RLE Weight Bearing: Weight bearing as tolerated      Mobility  Bed Mobility Overal bed mobility: Needs Assistance Bed Mobility: Supine to Sit     Supine to sit: Min guard, Min assist     General bed mobility comments: increased time, labored movement with difficulty propping up on elbows to hands    Transfers Overall transfer level: Needs assistance Equipment used: Rolling walker (2 wheels) Transfers: Sit to/from Stand, Bed to chair/wheelchair/BSC Sit to Stand: Min guard, Min assist   Step pivot transfers: Min guard, Min assist       General transfer comment: increased time, labored movement    Ambulation/Gait Ambulation/Gait assistance: Min guard Gait Distance (Feet): 35 Feet Assistive device: Rolling walker (2 wheels) Gait Pattern/deviations: Decreased step length - right, Decreased step length - left, Decreased stride length, Antalgic Gait velocity: decreased     General Gait Details: slow labored cadence with fair return for right heel to toe stepping due to increased pain when weightbearing, limited mostly due to fatigue  Stairs            Wheelchair Mobility    Modified Rankin (Stroke Patients Only)       Balance Overall balance assessment: Needs assistance Sitting-balance support: Feet supported, No upper extremity supported Sitting balance-Leahy Scale: Good Sitting balance - Comments: seated at EOB   Standing balance support: During functional activity, Bilateral upper extremity supported Standing balance-Leahy Scale: Fair Standing balance comment: fair/good using RW  Pertinent Vitals/Pain Pain Assessment Pain Assessment: 0-10 Pain Score: 8  Pain Location: right knee Pain Descriptors / Indicators: Sore Pain Intervention(s): Limited activity within patient's tolerance, Monitored during session, Premedicated  before session, Repositioned    Home Living Family/patient expects to be discharged to:: Private residence Living Arrangements: Spouse/significant other Available Help at Discharge: Family;Available 24 hours/day Type of Home: House Home Access: Stairs to enter Entrance Stairs-Rails: Right;Left;Can reach both Entrance Stairs-Number of Steps: 4-5 Alternate Level Stairs-Number of Steps: 13 to basement Home Layout: Laundry or work area in basement;Two level Home Equipment: Agricultural consultant (2 wheels);Cane - single point      Prior Function Prior Level of Function : Independent/Modified Independent;Driving             Mobility Comments: Retail buyer SPC, drives ADLs Comments: Independent     Hand Dominance   Dominant Hand: Right    Extremity/Trunk Assessment   Upper Extremity Assessment Upper Extremity Assessment: Overall WFL for tasks assessed    Lower Extremity Assessment Lower Extremity Assessment: Overall WFL for tasks assessed;RLE deficits/detail RLE Deficits / Details: grossly -4/5 RLE: Unable to fully assess due to pain RLE Sensation: WNL RLE Coordination: WNL    Cervical / Trunk Assessment Cervical / Trunk Assessment: Normal  Communication   Communication: No difficulties  Cognition Arousal/Alertness: Awake/alert Behavior During Therapy: WFL for tasks assessed/performed Overall Cognitive Status: Within Functional Limits for tasks assessed                                          General Comments      Exercises Total Joint Exercises Ankle Circles/Pumps: Supine, 10 reps, Right, Strengthening, AROM Quad Sets: AROM, Strengthening, Right, 10 reps, Supine Short Arc Quad: AROM, Strengthening, Right, 10 reps, Supine, AAROM Heel Slides: AROM, Strengthening, Right, 10 reps, Supine Goniometric ROM: Right knee: 0 - 100 degrees   Assessment/Plan    PT Assessment Patient needs continued PT services  PT Problem List Decreased  strength;Decreased range of motion;Decreased activity tolerance;Decreased balance;Decreased mobility;Pain       PT Treatment Interventions DME instruction;Gait training;Stair training;Functional mobility training;Therapeutic activities;Therapeutic exercise;Patient/family education;Balance training    PT Goals (Current goals can be found in the Care Plan section)  Acute Rehab PT Goals Patient Stated Goal: return home with family to assist PT Goal Formulation: With patient/family Time For Goal Achievement: 04/29/23 Potential to Achieve Goals: Good    Frequency BID     Co-evaluation               AM-PAC PT "6 Clicks" Mobility  Outcome Measure Help needed turning from your back to your side while in a flat bed without using bedrails?: A Little Help needed moving from lying on your back to sitting on the side of a flat bed without using bedrails?: A Little Help needed moving to and from a bed to a chair (including a wheelchair)?: A Little Help needed standing up from a chair using your arms (e.g., wheelchair or bedside chair)?: A Little Help needed to walk in hospital room?: A Little Help needed climbing 3-5 steps with a railing? : A Lot 6 Click Score: 17    End of Session   Activity Tolerance: Patient tolerated treatment well;Patient limited by fatigue;Patient limited by pain Patient left: in chair;with call bell/phone within reach;with family/visitor present Nurse Communication: Mobility status PT Visit Diagnosis: Unsteadiness on feet (R26.81);Other abnormalities of  gait and mobility (R26.89);Muscle weakness (generalized) (M62.81)    Time: 0981-1914 PT Time Calculation (min) (ACUTE ONLY): 31 min   Charges:   PT Evaluation $PT Eval Moderate Complexity: 1 Mod PT Treatments $Therapeutic Activity: 23-37 mins        3:36 PM, 04/27/23 Ocie Bob, MPT Physical Therapist with [facility] [phone] office [phone] mobile phone

## 2023-04-27 NOTE — Anesthesia Procedure Notes (Signed)
Date/Time: 04/27/2023 7:30 AM  Performed by: Franco Nones, CRNAPre-anesthesia Checklist: Patient identified, Emergency Drugs available, Suction available, Timeout performed and Patient being monitored Patient Re-evaluated:Patient Re-evaluated prior to induction Oxygen Delivery Method: Nasal Cannula

## 2023-04-27 NOTE — TOC Initial Note (Signed)
Transition of Care [facility]) - Initial/Assessment Note    Patient Details  Name: [name] MRN: [id number] Date of Birth: 1946-06-30  [facility] CM/SW Contact:    Villa Herb, LCSWA Phone Number: 04/27/2023, 4:01 PM  Clinical Narrative:                 CSW spoke to Clifton Custard with [facility] who states they have received pts [facility] referral and orders from Dr. Mort Sawyers office. TOC signing off.   Expected Discharge Plan: Home w Home Health Services Barriers to Discharge: Continued Medical Work up   Patient Goals and CMS Choice Patient states their goals for this hospitalization and ongoing recovery are:: return home CMS Medicare.gov Compare Post Acute Care list provided to:: Patient Choice offered to / list presented to : Patient      Expected Discharge Plan and Services In-house Referral: Clinical Social Work Discharge Planning Services: CM Consult Post Acute Care Choice: Home Health Living arrangements for the past 2 months: Single Family Home                           HH Arranged: PT          Prior Living Arrangements/Services Living arrangements for the past 2 months: Single Family Home Lives with:: Spouse Patient language and need for interpreter reviewed:: Yes Do you feel safe going back to the place where you live?: Yes      Need for Family Participation in Patient Care: Yes (Comment) Care giver support system in place?: Yes (comment)   Criminal Activity/Legal Involvement Pertinent to Current Situation/Hospitalization: No - Comment as needed  Activities of Daily Living Home Assistive Devices/Equipment: Cane (specify quad or straight) ADL Screening (condition at time of admission) Patient's cognitive ability adequate to safely complete daily activities?: Yes Is the patient deaf or have difficulty hearing?: No Does the patient have difficulty seeing, even when wearing glasses/contacts?: No Does the patient have difficulty concentrating,  remembering, or making decisions?: No Patient able to express need for assistance with ADLs?: Yes Does the patient have difficulty dressing or bathing?: No Independently performs ADLs?: Yes (appropriate for developmental age) Does the patient have difficulty walking or climbing stairs?: No Weakness of Legs: None Weakness of Arms/Hands: None  Permission Sought/Granted                  Emotional Assessment Appearance:: Appears stated age Attitude/Demeanor/Rapport: Engaged Affect (typically observed): Accepting Orientation: : Oriented to Self, Oriented to Place, Oriented to  Time, Oriented to Situation Alcohol / Substance Use: Not Applicable Psych Involvement: No (comment)  Admission diagnosis:  Osteoarthritis of right knee [M17.11] Patient Active Problem List   Diagnosis Date Noted   Primary osteoarthritis of right knee 04/27/2023   Osteoarthritis of right knee 04/27/2023   Malignant neoplasm of upper-inner quadrant of right breast in female, estrogen receptor positive (HCC) 08/13/2022   Urinary incontinence, mixed 08/13/2022   Obesity, diabetes, and hypertension syndrome (HCC) 08/13/2022   Former tobacco use 08/13/2022   Vitamin B12 deficiency 01/29/2021   S/P right knee arthroscopy 10/18/20 10/18/2020   Acute lateral meniscus tear of right knee    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 08/23/2020   Colorectal polyps    Personal history of colonic polyps 02/14/2020   Stage 3a chronic kidney disease (HCC) 07/21/2017   Special screening for malignant neoplasms, colon    Kidney stone 01/13/2017   Class  1 obesity due to excess calories with serious comorbidity in adult 07/16/2016   Actinic keratosis 01/12/2016   Benign essential hypertension 01/12/2016   Gastroesophageal reflux disease without esophagitis 01/12/2016   Mixed hyperlipidemia 01/12/2016   Osteopenia 01/12/2016   Seborrheic keratoses 01/12/2016   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) 10/16/2015   History of cancer of left breast 10/16/2015   Nonspecific abnormal finding in stool contents 01/11/2012   Type 2 diabetes mellitus with hyperlipidemia (HCC) 01/11/2012   PCP:  Gwenlyn Found, MD Pharmacy:   [facility] - [place], [place] - [address] [place] [address] Phone: [phone] Fax: [phone]  [facility] [address] [place] - [place] [address] [place] [address] [place] [address] Phone: [phone] Fax: [phone]     Social Determinants of Health (SDOH) Social History: SDOH Screenings   Food Insecurity: No Food Insecurity (04/27/2023)  Housing: Low Risk  (04/27/2023)  Transportation Needs: No Transportation Needs (04/27/2023)  Utilities: Not At Risk (04/27/2023)  Tobacco Use: Medium Risk (04/27/2023)   SDOH Interventions:     Readmission Risk Interventions     No data to display

## 2023-04-27 NOTE — Anesthesia Postprocedure Evaluation (Signed)
Anesthesia Post Note  Patient: [name]  Procedure(s) Performed: TOTAL KNEE ARTHROPLASTY (Right: Knee)  Patient location during evaluation: Phase II Anesthesia Type: Spinal Level of consciousness: awake and alert and oriented Pain management: pain level controlled Vital Signs Assessment: post-procedure vital signs reviewed and stable Respiratory status: spontaneous breathing, nonlabored ventilation and respiratory function stable Cardiovascular status: blood pressure returned to baseline and stable Postop Assessment: no apparent nausea or vomiting Anesthetic complications: no  No notable events documented.   Last Vitals:  Vitals:   04/27/23 1115 04/27/23 1130  BP: 139/77 (!) 146/76  Pulse: 95 94  Resp: 20 20  Temp:    SpO2: 96% 99%    Last Pain:  Vitals:   04/27/23 1100  TempSrc:   PainSc: 0-No pain                 Annalyssa Thune C Sienna Stonehocker

## 2023-04-27 NOTE — Anesthesia Procedure Notes (Addendum)
Anesthesia Regional Block: Adductor canal block   Pre-Anesthetic Checklist: , timeout performed,  Correct Patient, Correct Site, Correct Laterality,  Correct Procedure, Correct Position, site marked,  Risks and benefits discussed,  Surgical consent,  Pre-op evaluation,  At surgeon's request and post-op pain management  Laterality: Right  Prep: chloraprep       Needles:  Injection technique: Single-shot  Needle Type: Echogenic Stimulator Needle     Needle Length: 10cm  Needle Gauge: 20   Needle insertion depth: 6 cm   Additional Needles:   Procedures:, nerve stimulator,,, ultrasound used (permanent image in chart),,     Nerve Stimulator or Paresthesia:  Response: no Twitch elicited, 0.5 mA, 0.3 ms, 6 cm  Additional Responses:   Narrative:  Start time: 04/27/2023 7:10 AM End time: 04/27/2023 7:15 AM Injection made incrementally with aspirations every 5 mL.  Performed by: Personally  Anesthesiologist: Molli Barrows, MD  Additional Notes: BP cuff, EKG monitors applied. Sedation begun. After nerve location anesthetic injected incrementally, slowly , and after neg aspirations. Tolerated well.

## 2023-04-27 NOTE — Anesthesia Procedure Notes (Addendum)
Spinal  Patient location during procedure: OR Start time: 04/27/2023 7:38 AM End time: 04/27/2023 7:43 AM Reason for block: surgical anesthesia Staffing Performed: resident/CRNA  Resident/CRNA: Franco Nones, CRNA Performed by: Franco Nones, CRNA Authorized by: Molli Barrows, MD   Preanesthetic Checklist Completed: patient identified, IV checked, site marked, risks and benefits discussed, surgical consent, monitors and equipment checked, pre-op evaluation and timeout performed Spinal Block Patient position: right lateral decubitus Prep: Betadine Patient monitoring: heart rate, cardiac monitor, continuous pulse ox and blood pressure Approach: right paramedian Location: L3-4 Injection technique: single-shot Needle Needle type: Pencan  Needle gauge: 24 G Needle length: 9 cm Assessment Sensory level: T6 Events: CSF return Additional Notes ATTEMPTS: 1 TRAY ID: [id number] Amass: [id number] Primus Bravo DATE: 2025-02-13 Lot # [id number]

## 2023-04-27 NOTE — Anesthesia Preprocedure Evaluation (Signed)
Anesthesia Evaluation  Patient identified by MRN, date of birth, ID band Patient awake    Reviewed: Allergy & Precautions, H&P , NPO status , Patient's Chart, lab work & pertinent test results  Airway Mallampati: II  TM Distance: >3 FB Neck ROM: Full    Dental  (+) Upper Dentures, Lower Dentures   Pulmonary neg pulmonary ROS, former smoker   Pulmonary exam normal breath sounds clear to auscultation       Cardiovascular Exercise Tolerance: Good hypertension, Pt. on medications Normal cardiovascular exam Rhythm:Regular Rate:Normal  06-Aug-2022 12:30:49 [facility]-300 ROUTINE RECORD 04/03/1946 (76 yr) Female Caucasian Vent. rate 91 BPM PR interval 160 ms QRS duration 80 ms QT/QTcB 336/413 ms P-R-T axes 68 46 40 Normal sinus rhythm Normal ECG When compared with ECG of 16-Oct-2020 12:08, PREVIOUS ECG IS PRESENT Confirmed by Marca Ancona [id number]) on 08/06/2022 11:42:56 PM   Neuro/Psych  Neuromuscular disease (right sciatica)  negative psych ROS   GI/Hepatic Neg liver ROS,GERD  Medicated and Controlled,,  Endo/Other  diabetes, Well Controlled, Type 2, Oral Hypoglycemic Agents    Renal/GU Renal InsufficiencyRenal disease  negative genitourinary   Musculoskeletal  (+) Arthritis , Osteoarthritis,    Abdominal   Peds negative pediatric ROS (+)  Hematology  (+) Blood dyscrasia, anemia   Anesthesia Other Findings Back pain, right sided sciatica  Reproductive/Obstetrics negative OB ROS                              Anesthesia Physical Anesthesia Plan  ASA: 2  Anesthesia Plan: Spinal   Post-op Pain Management: Dilaudid IV and Regional block*   Induction: Intravenous  PONV Risk Score and Plan: 3 and Ondansetron and Dexamethasone  Airway Management Planned: Nasal Cannula and Natural Airway  Additional Equipment:   Intra-op Plan:   Post-operative Plan:   Informed  Consent: I have reviewed the patients History and Physical, chart, labs and discussed the procedure including the risks, benefits and alternatives for the proposed anesthesia with the patient or authorized representative who has indicated his/her understanding and acceptance.     Dental advisory given  Plan Discussed with: CRNA and Surgeon  Anesthesia Plan Comments:          Anesthesia Quick Evaluation

## 2023-04-27 NOTE — Transfer of Care (Signed)
Immediate Anesthesia Transfer of Care Note  Patient: [name]  Procedure(s) Performed: TOTAL KNEE ARTHROPLASTY (Right: Knee)  Patient Location: PACU  Anesthesia Type:Spinal  Level of Consciousness: awake and patient cooperative  Airway & Oxygen Therapy: Patient Spontanous Breathing and Patient connected to nasal cannula oxygen  Post-op Assessment: Report given to RN and Post -op Vital signs reviewed and stable  Post vital signs: Reviewed and stable  Last Vitals:  Vitals Value Taken Time  BP 112/65 04/27/23 1020  Temp 97.9 04/27/23 1023  Pulse 88 04/27/23 1022  Resp 17 04/27/23 1022  SpO2 99 % 04/27/23 1022  Vitals shown include unvalidated device data. T 8 Last Pain:  Vitals:   04/27/23 0640  TempSrc: Oral  PainSc: 0-No pain      Patients Stated Pain Goal: 7 (04/27/23 0640)  Complications: No notable events documented.

## 2023-04-27 NOTE — Op Note (Signed)
Dictation for total knee replacement  04/27/2023  10:15 AM  Orthopaedic Surgery Operative Note (CSN: [id number])  [name]  03-13-1946 Date of Surgery: 04/27/2023   Diagnoses:  right knee osteoarthritis  Procedure: RIGHT Total knee arthroplasty   Operative Finding SEVERE OA LATERAL COMPARTMENT GR 4  PTF JOINT GRADE 4  MEDIAL COMPARTMENT GRADE 3  PREOP FLEXION CONTRACTURE 5 DEGREES    Post-Op Diagnosis: Same Surgeons:Primary: Vickki Hearing, MD Assistants: Cecile Sheerer Location: [facility] OR ROOM 4 Anesthesia: SPINAL PLUS SAPHENOUS NERVE BLOCK   Antibiotics: Ancef 2 g Tourniquet time: * Missing tourniquet times found for documented tourniquets in log: [id number] *140 MINUTES AT 300 MM  Estimated Blood Loss: MIN Complications: None Specimens: None   Implants: Implant Name Type Inv. Item Serial No. Manufacturer Lot No. LRB No. Used Action  CEMENT HV SMART SET - [id number] Cement CEMENT HV SMART SET  [employer] [id number] Right 2 Implanted  INSERT TIB ATTUNE FB SZ5X6 - [id number] Insert INSERT TIB ATTUNE FB SZ5X6  [employer]PU[employer]TH[employer] [id number] Right 1 Implanted  PATELLA MEDIAL ATTUN KNEE - [id number] Knees PATELLA MEDIAL ATTUN KNEE  DEPUY ORTHOPAEDICS [id number] Right 1 Implanted  ATTUNE PS FEM RT SZ 5 CEM KNEE - [id number] Femur ATTUNE PS FEM RT SZ 5 CEM KNEE  DEPU[employer] ORTH[employer] [id number] Right 1 Implanted  BASEPLATE TIB CMT FB PCKT SZ4 - [id number] Stem BASEPLATE TIB CMT FB PCKT SZ4  [employer]PU[employer] ORTH[employer] [id number] Right 1 Implanted     Indications for Surgery:   [name] is a 77 y.o. female who presented for operative fixation of her right knee with a right total knee arthroplasty.  Benefits and risks of operative and nonoperative management were discussed prior to surgery with patient/guardian(s) and informed consent form was completed.  While all risks cannot be anticipated, specific risks including infection, need for additional surgery, stiffness, postop  pain, infection, implant removal, loosening, infection requiring amputation, deep vein thrombosis, pulmonary embolus were discussed.   Procedure:    Tourniquet was used for the above duration of 140 minutes at 300 mm   Details of surgery: The patient was identified by 2 approved identification mechanisms. The operative extremity was evaluated and found to be acceptable for surgical treatment today. The chart was reviewed. The surgical site was confirmed and marked. The patient had a saphenous nerve block preoperatively  The patient was taken to the operating room and given appropriate antibiotic 2 g Ancef. This is consistent with the SCIP protocol.  The patient was given the following anesthetic: General plus preop saphenous nerve block  The patient was then placed supine on the operating table. A Foley catheter was inserted. The operative extremity was prepped and draped sterilely from the toes to the groin.  Timeout was executed confirming the patient's name, surgical site, antibiotic administration, x-rays available, and implants available.  The operative limb,  was exsanguinated with a six-inch Esmarch and the tourniquet was inflated to 300 mmHg.  A straight midline incision was made over the right KNEE and taken down to the extensor mechanism. A medial arthrotomy was performed. The patella was everted and the patellofemoral soft tissue was released, along with the patellar fat pad.  The anterior cruciate ligament and PCL were resected.  The anterior horns of the lateral and medial meniscus were resected. The medial soft tissue sleeve was elevated to the mid coronal plane.  A three-eighths inch drill bit was used to enter the femoral canal which was decompressed with suction and  irrigation until clear.   The distal femoral cutting guide was set for 9 mm distal resection,  4valgus alignment, for a right knee. The distal femur was resected and checked for flatness.  The attune sizing  femoral guide was placed and the femur was preliminarily sized to a size 5 .   The external alignment guide for the tibial resection was then applied to the distal and proximal tibia and set for anatomic slope along with 2 MM resection  from the medial side.   Rotational alignment was set using the malleolus, the tibial tubercle and the tibial spines.  The proximal tibia was resected along with  residual menisci. The tibia was sized using a base plate to a size 4.   The extension gap was checked.  It was too tight.  I purposely did not resect too much bone from the distal femur or proximal tibia because of the valgus knee.  I took 2 additional millimeters from the distal femur and 2 additional millimeters from the tibia.  I repeated the spacer block and was able to insert a size 6   A 4-in-1 cutting block was placed along with collateral ligament retractors.  Our target was proper rotation based on the epicondylar axis using Whitesides line as a guide as well.  Once the block was pinned in place we took the spacer block that balanced extension gap and placed it on top of the tibia under the femoral block.  This fit very well with a 6 with the block moved down when pinhole level   (once the block was moved down an angel wing was placed to check for notching prior to the actual cut).  Once I was satisfied with the spacer block collateral ligament retractors were placed and I completed the 4 distal femoral cuts   The extension gap was rechecked with the same spacer block which was a size 6   The correct sized notch cutting guide for the femur was then applied and the notch cut was made.  Trial reduction was completed using size 5 femur 4 tibia 6 implant polyethylene trial implants. Patella tracking was normal  We then skeletonized the patella. It measured 22 in thickness and the patellar resection was set for 9.5 mm resection millimeters. the patellar resection was completed. The patella  diameter measured 35. We then drilled the peg holes for the patella.   The proximal tibia was prepared using the size 4 base plate.  Thorough irrigation was performed using saline  and the bone was dried and prepared for cement. The cement was mixed on the back table using third generation preparation techniques  The implants were then cemented in place and excess cement was removed. The cement was allowed to cure. Surgipor irrigation was placed followed by saline irrigation  Any excess bone fragments and cement was removed.  The extensor mechanism was closed with #1 Bralon suture followed by subcutaneous tissue closure using 0 Monocryl suture in 2 layers  Zynrelef a total of 2 vials were injected prior to complete extensor mechanism closure.  The openings and the capsule were then closed with #1 Braylon   Skin approximation was performed using staples  A sterile dressing was applied, TED hose were placed on the operative extremity followed by Cryo/Cuff.  The patient was taken recovery room in stable condition  Postop plan: Weightbearing as tolerated CPM machine Immediate physical therapy Discharge tomorrow if stable

## 2023-04-28 DIAGNOSIS — M1711 Unilateral primary osteoarthritis, right knee: Secondary | ICD-10-CM | POA: Diagnosis not present

## 2023-04-28 LAB — GLUCOSE, CAPILLARY: Glucose-Capillary: 95 mg/dL (ref 70–99)

## 2023-04-28 LAB — CBC
HCT: 30 % — ABNORMAL LOW (ref 36.0–46.0)
Hemoglobin: 9.4 g/dL — ABNORMAL LOW (ref 12.0–15.0)
MCH: 27.6 pg (ref 26.0–34.0)
MCHC: 31.3 g/dL (ref 30.0–36.0)
MCV: 88.2 fL (ref 80.0–100.0)
Platelets: 167 10*3/uL (ref 150–400)
RBC: 3.4 MIL/uL — ABNORMAL LOW (ref 3.87–5.11)
RDW: 15 % (ref 11.5–15.5)
WBC: 7.1 10*3/uL (ref 4.0–10.5)
nRBC: 0 % (ref 0.0–0.2)

## 2023-04-28 LAB — BASIC METABOLIC PANEL
Anion gap: 9 (ref 5–15)
BUN: 24 mg/dL — ABNORMAL HIGH (ref 8–23)
CO2: 22 mmol/L (ref 22–32)
Calcium: 8.3 mg/dL — ABNORMAL LOW (ref 8.9–10.3)
Chloride: 104 mmol/L (ref 98–111)
Creatinine, Ser: 1.42 mg/dL — ABNORMAL HIGH (ref 0.44–1.00)
GFR, Estimated: 38 mL/min — ABNORMAL LOW (ref >60)
Glucose, Bld: 106 mg/dL — ABNORMAL HIGH (ref 70–99)
Potassium: 4.6 mmol/L (ref 3.5–5.1)
Sodium: 135 mmol/L (ref 135–145)

## 2023-04-28 MED ORDER — HYDROCODONE-ACETAMINOPHEN 10-325 MG PO TABS: 1.0000 | ORAL_TABLET | ORAL | 0 refills | Status: DC | PRN

## 2023-04-28 MED ORDER — ASPIRIN 81 MG PO CHEW: 81.0000 mg | CHEWABLE_TABLET | Freq: Two times a day (BID) | ORAL | 0 refills | Status: DC

## 2023-04-28 MED ORDER — CELECOXIB 200 MG PO CAPS: 200.0000 mg | ORAL_CAPSULE | Freq: Two times a day (BID) | ORAL | 1 refills | Status: DC

## 2023-04-28 MED ORDER — METHOCARBAMOL 500 MG PO TABS: 500.0000 mg | ORAL_TABLET | Freq: Four times a day (QID) | ORAL | 1 refills | Status: DC | PRN

## 2023-04-28 MED ORDER — POLYETHYLENE GLYCOL 3350 17 G PO PACK: 17.0000 g | PACK | Freq: Every day | ORAL | 0 refills | Status: DC

## 2023-04-28 MED ORDER — DOCUSATE SODIUM 100 MG PO CAPS: 100.0000 mg | ORAL_CAPSULE | Freq: Two times a day (BID) | ORAL | 0 refills | Status: DC

## 2023-04-28 NOTE — Discharge Summary (Signed)
Physician Discharge Summary  Patient ID: [name] MRN: [id number] DOB/AGE: 78/19/1947 77 y.o.  Admit date: 04/27/2023 Discharge date: 04/28/2023  Admission Diagnoses: Osteoarthritis right knee  Discharge Diagnoses:  Principal Problem:   Osteoarthritis of right knee Active Problems:   Primary osteoarthritis of right knee   Discharged Condition: good  Hospital Course: Hospital day 1 June 11 surgical procedure right total knee spinal anesthetic saphenous nerve block no complications tolerated physical therapy 35 feet of ambulation 0 to 100 degree range of motion  Hospital day 2 stable vital signs neurovascularly intact cleared physical therapy and steps cleared for discharge  Significant Diagnostic Studies: labs:     Latest Ref Rng & Units 04/28/2023    4:43 AM 04/20/2023   10:25 AM 08/10/2022    8:18 AM  CBC  WBC 4.0 - 10.5 K/uL 7.1  5.1    Hemoglobin 12.0 - 15.0 g/dL 9.4  62.1  30.8   Hematocrit 36.0 - 46.0 % 30.0  36.5  36.0   Platelets 150 - 400 K/uL 167  212        Latest Ref Rng & Units 04/28/2023    4:43 AM 04/20/2023   10:25 AM 08/10/2022    8:18 AM  BMP  Glucose 70 - 99 mg/dL 657  846  962   BUN 8 - 23 mg/dL 24  26  27    Creatinine 0.44 - 1.00 mg/dL 9.52  8.41  3.24   Sodium 135 - 145 mmol/L 135  135  142   Potassium 3.5 - 5.1 mmol/L 4.6  4.7  4.8   Chloride 98 - 111 mmol/L 104  106  110   CO2 22 - 32 mmol/L 22  21    Calcium 8.9 - 10.3 mg/dL 8.3  9.4         Discharge Exam: Blood pressure 137/70, pulse 91, temperature 97.7 F (36.5 C), temperature source Oral, resp. rate 18, height 5\' 5"  (1.651 m), weight 77.5 kg, SpO2 93 %. Physical Exam Vitals and nursing note reviewed.  Constitutional:      Appearance: Normal appearance.  HENT:     Head: Normocephalic and atraumatic.  Eyes:     General: No scleral icterus.       Right eye: No discharge.        Left eye: No discharge.     Extraocular Movements: Extraocular movements intact.     Conjunctiva/sclera:  Conjunctivae normal.     Pupils: Pupils are equal, round, and reactive to light.  Cardiovascular:     Rate and Rhythm: Normal rate.     Pulses: Normal pulses.  Skin:    General: Skin is warm and dry.     Capillary Refill: Capillary refill takes less than 2 seconds.  Neurological:     General: No focal deficit present.     Mental Status: She is alert and oriented to person, place, and time.  Psychiatric:        Mood and Affect: Mood normal.        Behavior: Behavior normal.        Thought Content: Thought content normal.        Judgment: Judgment normal.   Homans negative normal pulses Dressing dry Calf soft   Disposition: Discharge disposition: 01-Home or Self Care       Discharge Instructions     Call MD / Call 911   Complete by: As directed    If you experience chest pain or shortness of breath, CALL  911 and be transported to the hospital emergency room.  If you develope a fever above 101 F, pus (white drainage) or increased drainage or redness at the wound, or calf pain, call your surgeon's office.   Constipation Prevention   Complete by: As directed    Drink plenty of fluids.  Prune juice may be helpful.  You may use a stool softener, such as Colace (over the counter) 100 mg twice a day.  Use MiraLax (over the counter) for constipation as needed.   Diet - low sodium heart healthy   Complete by: As directed    Discharge instructions   Complete by: As directed    1 - Blue foam  30 , 3 x a day   2 - CPM (if your insurer approved) 6 hrs per day  --may divide time in the cpm as tolertated \  --start at 0-90  and increase 10 per day.   --Use it for 2 weeks   3- Exercises as taught while you were in the hospital   4 - No shower   5 - Dressing, It  is waterproof and does not need changing unless it soaks thru   6 - Ice. Apply the cooling device to tthe knee for 30 min, 6 x a day   Increase activity slowly as tolerated   Complete by: As directed    Post-operative  opioid taper instructions:   Complete by: As directed    POST-OPERATIVE OPIOID TAPER INSTRUCTIONS: It is important to wean off of your opioid medication as soon as possible. If you do not need pain medication after your surgery it is ok to stop day one. Opioids include: Codeine, Hydrocodone(Norco, Vicodin), Oxycodone(Percocet, oxycontin) and hydromorphone amongst others.  Long term and even short term use of opiods can cause: Increased pain response Dependence Constipation Depression Respiratory depression And more.  Withdrawal symptoms can include Flu like symptoms Nausea, vomiting And more Techniques to manage these symptoms Hydrate well Eat regular healthy meals Stay active Use relaxation techniques(deep breathing, meditating, yoga) Do Not substitute Alcohol to help with tapering If you have been on opioids for less than two weeks and do not have pain than it is ok to stop all together.  Plan to wean off of opioids This plan should start within one week post op of your joint replacement. Maintain the same interval or time between taking each dose and first decrease the dose.  Cut the total daily intake of opioids by one tablet each day Next start to increase the time between doses. The last dose that should be eliminated is the evening dose.         Allergies as of 04/28/2023   No Known Allergies      Medication List     STOP taking these medications    traMADol 50 MG tablet Commonly known as: ULTRAM       TAKE these medications    Accu-Chek Aviva Plus w/Device Kit   Accu-Chek Aviva Soln   acetaminophen 500 MG tablet Commonly known as: TYLENOL Take 500 mg by mouth every 6 (six) hours as needed (pain.).   amLODipine-olmesartan 5-40 MG tablet Commonly known as: AZOR Take 1 tablet by mouth in the morning.   anastrozole 1 MG tablet Commonly known as: ARIMIDEX Take 1 tablet (1 mg total) by mouth daily.   aspirin 81 MG chewable tablet Chew 1 tablet  (81 mg total) by mouth 2 (two) times daily.   calcium carbonate 750 MG chewable  tablet Commonly known as: TUMS EX Chew 2 tablets by mouth in the morning.   celecoxib 200 MG capsule Commonly known as: CELEBREX Take 1 capsule (200 mg total) by mouth 2 (two) times daily.   cyanocobalamin 1000 MCG tablet Commonly known as: VITAMIN B12 Take 1,000 mcg by mouth every evening.   docusate sodium 100 MG capsule Commonly known as: COLACE Take 1 capsule (100 mg total) by mouth 2 (two) times daily.   fenofibrate 54 MG tablet Take 54 mg by mouth every evening.   Fish Oil 1000 MG Caps Take 1,000 mg by mouth every evening.   Gemtesa 75 MG Tabs Generic drug: Vibegron Take 75 mg by mouth in the morning.   glucose blood test strip 1 each 2 (two) times daily. Check Blood sugar 1-2 times daily   HYDROcodone-acetaminophen 10-325 MG tablet Commonly known as: Norco Take 1 tablet by mouth every 4 (four) hours as needed.   metFORMIN 1000 MG tablet Commonly known as: GLUCOPHAGE Take 1,000 mg by mouth 2 (two) times daily with a meal.   methocarbamol 500 MG tablet Commonly known as: ROBAXIN Take 1 tablet (500 mg total) by mouth every 6 (six) hours as needed for muscle spasms.   multivitamin with minerals Tabs tablet Take 1 tablet by mouth every morning. Women's One A Day 50+   omeprazole 10 MG capsule Commonly known as: PRILOSEC Take 10 mg by mouth daily before breakfast.   polyethylene glycol 17 g packet Commonly known as: MIRALAX / GLYCOLAX Take 17 g by mouth daily. Start taking on: April 29, 2023   simvastatin 40 MG tablet Commonly known as: ZOCOR Take 40 mg by mouth at bedtime.   Vitamin D3 25 MCG (1000 UT) Caps Take 1,000 Units by mouth every evening.         Signed: Fuller Canada 04/28/2023, 8:34 AM

## 2023-04-28 NOTE — Progress Notes (Signed)
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    PT Comments    Patient demonstrates good return for moving RLE during bed mobility, going up/down steps in stairs using bilateral side rails with step to pattern without loss of balance and understanding acknowledged and increased endurance/distance for gait training with fair/good return for right heel to toe stepping.  Patient tolerated staying up in chair with RLE dangling after therapy.  Patient will benefit from continued skilled physical therapy in hospital and recommended venue below to increase strength, balance, endurance for safe ADLs and gait.     Recommendations for follow up therapy are one component of a multi-disciplinary discharge planning process, led by the attending physician.  Recommendations may be updated based on patient status, additional functional criteria and insurance authorization.  Follow Up Recommendations       Assistance Recommended at Discharge Set up Supervision/Assistance  Patient can return home with the following A little help with walking and/or transfers;A little help with bathing/dressing/bathroom;Help with stairs or ramp for entrance;Assistance with cooking/housework   Equipment Recommendations  None recommended by PT    Recommendations for Other Services       Precautions / Restrictions Precautions Precautions: Fall Restrictions Weight Bearing Restrictions: Yes RLE Weight Bearing: Weight bearing as tolerated     Mobility  Bed Mobility Overal bed mobility: Modified Independent             General bed mobility comments: demonstrated good return for moving RLE  during bed mobility with HOB flat and no use of bed rails    Transfers Overall transfer level: Needs assistance Equipment used: Rolling walker (2 wheels) Transfers: Sit to/from Stand, Bed to chair/wheelchair/BSC Sit to Stand: Supervision, Modified independent (Device/Increase time)   Step pivot transfers: Supervision, Modified independent (Device/Increase time)       General transfer comment: verbal cues for proper hand placement during sit to stands, standing to sit with good carryover demonstrated    Ambulation/Gait Ambulation/Gait assistance: Modified independent (Device/Increase time), Supervision Gait Distance (Feet): 120 Feet Assistive device: Rolling walker (2 wheels) Gait Pattern/deviations: Decreased step length - right, Decreased step length - left, Decreased stride length, Antalgic Gait velocity: decreased     General Gait Details: increased endurance/distance for gait training with fair/good return for right heel to toe stepping without loss of balance   Stairs Stairs: Yes Stairs assistance: Modified independent (Device/Increase time), Supervision Stair Management: Two rails, Step to pattern Number of Stairs: 4 General stair comments: demonstrates good return for going up/down steps using bilateral siderails with step to pattern without loss of balance and understanding acknowledged   Wheelchair Mobility    Modified Rankin (Stroke Patients Only)       Balance Overall balance assessment: Needs assistance Sitting-balance support: Feet supported, No upper extremity supported Sitting balance-Leahy Scale: Good Sitting balance - Comments: seated at EOB   Standing balance support: During functional activity, Bilateral upper extremity supported Standing balance-Leahy Scale: Fair Standing balance comment: fair/good using RW                            Cognition Arousal/Alertness: Awake/alert Behavior During Therapy: WFL for  tasks  assessed/performed Overall Cognitive Status: Within Functional Limits for tasks assessed                                          Exercises      General Comments        Pertinent Vitals/Pain Pain Assessment Pain Assessment: 0-10 Pain Score: 3  Pain Location: right knee Pain Descriptors / Indicators: Sore Pain Intervention(s): Limited activity within patient's tolerance, Monitored during session, Premedicated before session, Repositioned    Home Living                          Prior Function            PT Goals (current goals can now be found in the care plan section) Acute Rehab PT Goals Patient Stated Goal: return home with family to assist PT Goal Formulation: With patient/family Time For Goal Achievement: 04/29/23 Potential to Achieve Goals: Good Progress towards PT goals: Progressing toward goals    Frequency    BID      PT Plan Current plan remains appropriate    Co-evaluation              AM-PAC PT "6 Clicks" Mobility   Outcome Measure  Help needed turning from your back to your side while in a flat bed without using bedrails?: None Help needed moving from lying on your back to sitting on the side of a flat bed without using bedrails?: None Help needed moving to and from a bed to a chair (including a wheelchair)?: A Little Help needed standing up from a chair using your arms (e.g., wheelchair or bedside chair)?: None Help needed to walk in hospital room?: A Little Help needed climbing 3-5 steps with a railing? : A Little 6 Click Score: 21    End of Session   Activity Tolerance: Patient tolerated treatment well;Patient limited by fatigue Patient left: in chair;with call bell/phone within reach Nurse Communication: Mobility status PT Visit Diagnosis: Unsteadiness on feet (R26.81);Other abnormalities of gait and mobility (R26.89);Muscle weakness (generalized) (M62.81)     Time: 1610-9604 PT Time Calculation (min)  (ACUTE ONLY): 20 min  Charges:  $Gait Training: 8-22 mins $Therapeutic Activity: 8-22 mins                     {9:18 AM, 04/28/23 Ocie Bob, MPT Physical Therapist with [facility] [phone] office [phone] mobile phone

## 2023-04-28 NOTE — Progress Notes (Signed)
Patient slept through the night only waking for medications and vitals. Patient did state "This is the most I have been able to sleep in a while." Tyleonol given as ordered for pain. Patient aware she can get vicodin or morphine for pain, Patient stated " I would like to hold off and just use the tylenol, I will let you know if I need the other pain medications. Continue to monitor.

## 2023-04-29 ENCOUNTER — Telehealth: Payer: Self-pay | Admitting: Orthopedic Surgery

## 2023-04-29 ENCOUNTER — Ambulatory Visit (HOSPITAL_COMMUNITY): Payer: Medicare PPO

## 2023-04-29 ENCOUNTER — Encounter (HOSPITAL_COMMUNITY): Payer: Self-pay | Admitting: Orthopedic Surgery

## 2023-04-29 LAB — TYPE AND SCREEN
ABO/RH(D): O POS
Unit division: 0

## 2023-04-29 LAB — BPAM RBC
Blood Product Expiration Date: 202407122359
Unit Type and Rh: 5100
Unit Type and Rh: 5100

## 2023-04-29 NOTE — Telephone Encounter (Signed)
Dr. Mort Sawyers pt - [name] w/PT [facility] [phone] lvm stating that she started in home PT w/the patient today.  She would like verbal orders for 2 times a week for 1 week, 3 times a week for 1 week and 2 times a week for the third week.

## 2023-04-29 NOTE — Telephone Encounter (Signed)
I called and gave verbal orders. 

## 2023-05-04 ENCOUNTER — Encounter (HOSPITAL_COMMUNITY): Payer: Medicare PPO

## 2023-05-06 ENCOUNTER — Encounter (HOSPITAL_COMMUNITY): Payer: Medicare PPO

## 2023-05-10 ENCOUNTER — Encounter (HOSPITAL_COMMUNITY): Payer: Medicare PPO

## 2023-05-12 ENCOUNTER — Encounter (HOSPITAL_COMMUNITY): Payer: Medicare PPO

## 2023-05-13 ENCOUNTER — Ambulatory Visit (INDEPENDENT_AMBULATORY_CARE_PROVIDER_SITE_OTHER): Payer: Medicare PPO | Admitting: Orthopedic Surgery

## 2023-05-13 DIAGNOSIS — Z471 Aftercare following joint replacement surgery: Secondary | ICD-10-CM

## 2023-05-13 DIAGNOSIS — Z96651 Presence of right artificial knee joint: Secondary | ICD-10-CM

## 2023-05-13 NOTE — Progress Notes (Signed)
Chief Complaint  Patient presents with   Routine Post Op    Post of right knee  DOS 04/27/2023 starts out patient therapy on Monday     Status post right total knee arthroplasty attune fixed-bearing posterior stabilized total knee  Size 5 femur size 6 polyethylene insert 35 patella tibia size 4  Surgical incision looks fine we took out the staples.  She is advancing well with physical therapy ambulatory with a walker and hydrocodone 10 mg for pain.  She has not run out of the original prescription  She is on aspirin twice a day with TED hose for 2 more weeks  She has got therapy on Monday  Follow-up 4 to 5 weeks for postop visit #2  Encounter Diagnoses  Name Primary?   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

## 2023-05-17 ENCOUNTER — Other Ambulatory Visit: Payer: Self-pay

## 2023-05-17 ENCOUNTER — Ambulatory Visit: Payer: Medicare PPO | Attending: Orthopedic Surgery

## 2023-05-17 DIAGNOSIS — M25561 Pain in right knee: Secondary | ICD-10-CM | POA: Diagnosis present

## 2023-05-17 DIAGNOSIS — M1711 Unilateral primary osteoarthritis, right knee: Secondary | ICD-10-CM | POA: Insufficient documentation

## 2023-05-17 DIAGNOSIS — M25661 Stiffness of right knee, not elsewhere classified: Secondary | ICD-10-CM | POA: Insufficient documentation

## 2023-05-17 NOTE — Therapy (Signed)
OUTPATIENT PHYSICAL THERAPY LOWER EXTREMITY EVALUATION   Patient Name: [name] MRN: [id number] DOB:02-24-1946, 77 y.o., female Today's Date: 05/17/2023  END OF SESSION:  PT End of Session - 05/17/23 1028     Visit Number 1    Number of Visits 12    Date for PT Re-Evaluation 08/13/23    PT Start Time 1028    PT Stop Time 1105    PT Time Calculation (min) 37 min    Activity Tolerance Patient tolerated treatment well    Behavior During Therapy [facility] for tasks assessed/performed             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   History of kidney stones    History of radiation therapy 09-10-2015 to [phone]   left breast 50Gy   Iron deficiency anemia    Malignant neoplasm of lower-inner quadrant of left breast in female, estrogen receptor positive [facility]) oncologist-  dr Minta Balsam higgs ([facility])   dx 08-16-2015--  Stage 1A invasisive (T1c,N0,M0), ER/PR+, HER2 negative---- 06-26-2015 left partial mastectomy , 07-31-2015 left axilla node dissection x1 (negative)-- completed radiation 10-08-2015 and started arimidex 10-16-2015   Mixed hyperlipidemia    Nephrolithiasis    bilateral per CT 04-28-2018   Type 2 diabetes mellitus (HCC)    followed by pcp   Ureteral calculi    left   Urgency of urination    Wears glasses    Past Surgical History:  Procedure Laterality Date   ABDOMINAL HYSTERECTOMY  2005   AXILLARY SENTINEL NODE BIOPSY Left 07/31/2015   Procedure: SENTINEL LYMPH NODE BIOPSY, LEFT AXILLA, ;  Surgeon: Franky Macho, MD;  Location: [facility];  Service: General;  Laterality: Left;  Sentinel Node @  1000   BREAST BIOPSY Left 06/09/2016   Procedure: LEFT BREAST BIOPSY AFTER NEEDLE LOCALIZATION;  Surgeon: Franky Macho, MD;  Location: [facility];  Service: General;  Laterality: Left;   CARPAL TUNNEL RELEASE Left 11/02/2014   Procedure: CARPAL TUNNEL RELEASE;  Surgeon: Dairl Ponder, MD;  Location: [facility];  Service: Orthopedics;  Laterality: Left;   CATARACT EXTRACTION W/ INTRAOCULAR LENS  IMPLANT, BILATERAL  2007   COLONOSCOPY N/A 02/03/2017   Procedure: COLONOSCOPY;  Surgeon: West Bali, MD;  [facility]: [facility];  Service: Endoscopy;  Laterality: N/A;  1245   COLONOSCOPY N/A 03/01/2020   Procedure: COLONOSCOPY;  Surgeon: West Bali, MD;  [facility]: [facility];  Service: Endoscopy;  Laterality: N/A;  10:30am   COLONOSCOPY W/ BIOPSIES AND POLYPECTOMY     CYSTOSCOPY WITH RETROGRADE PYELOGRAM, URETEROSCOPY AND STENT PLACEMENT Left 05/09/2018   Procedure: CYSTOSCOPY WITH RETROGRADE PYELOGRAM, URETEROSCOPY AND STENT PLACEMENT;  Surgeon: Malen Gauze, MD;  Location: [facility];  Service: Urology;  Laterality: Left;   CYSTOSCOPY/URETEROSCOPY/HOLMIUM LASER/STENT PLACEMENT Bilateral 03/18/2017   Procedure: CYSTOSCOPY/ BILATERAL RETROGRADE/RIGHT URETEROSCOPY/ RIGHT HOLMIUM LASER APPLICATION/RIGHT URETERAL STENT PLACEMENT;  Surgeon: Bjorn Pippin, MD;  Location: [facility];  Service: Urology;  Laterality: Bilateral;   EXTRACORPOREAL SHOCK WAVE LITHOTRIPSY Right 12/14/2019   Procedure: EXTRACORPOREAL SHOCK  WAVE LITHOTRIPSY (ESWL);  Surgeon: Jerilee Field, MD;  Location: [facility];  Service: Urology;  Laterality: Right;   EXTRACORPOREAL SHOCK WAVE LITHOTRIPSY Left 02/01/2020   Procedure: EXTRACORPOREAL SHOCK WAVE LITHOTRIPSY (ESWL);  Surgeon: Sebastian Ache, MD;  Location: [facility];  Service: Urology;  Laterality: Left;   HOLMIUM LASER APPLICATION Left 05/09/2018   Procedure: HOLMIUM LASER APPLICATION;  Surgeon: Malen Gauze, MD;  Location:  [facility];  Service: Urology;  Laterality: Left;   KNEE ARTHROSCOPY WITH LATERAL MENISECTOMY Right 10/18/2020   Procedure: KNEE ARTHROSCOPY WITH LATERAL MENISECTOMY;  Surgeon: Vickki Hearing, MD;  Location: [facility];  Service: Orthopedics;  Laterality: Right;   OPEN REDUCTION INTERNAL FIXATION (ORIF) DISTAL RADIAL FRACTURE Left 11/02/2014   Procedure: OPEN REDUCTION INTERNAL FIXATION (ORIF) DISTAL RADIAL FRACTURE;  Surgeon: Dairl Ponder, MD;  [facility]: [facility];  Service: Orthopedics;  Laterality: Left;   PARTIAL MASTECTOMY WITH NEEDLE LOCALIZATION Left 06/26/2015   Procedure: PARTIAL MASTECTOMY WITH NEEDLE LOCALIZATION;  Surgeon: Franky Macho, MD;  Location: [facility];  Service: General;  Laterality: Left;  Needle Loc @ 8:00am   POLYPECTOMY  02/03/2017   Procedure: POLYPECTOMY;  Surgeon: West Bali, MD;  [facility];  Service: Endoscopy;;  descending and hepatic flexure   POLYPECTOMY  03/01/2020   Procedure: POLYPECTOMY;  Surgeon: West Bali, MD;  [facility];  Service: Endoscopy;;  ascending;splenic flexure polyps x2;rectal   TARSAL METATARSAL ARTHRODESIS Left 09-07-2007   dr Lestine Box  [facility]   first and second tarsal metatarsal fusion, gastroc slide, neurolysis   TOTAL ABDOMINAL HYSTERECTOMY W/ BILATERAL SALPINGOOPHORECTOMY  10-21-2004   dr soper  [facility]   TOTAL KNEE ARTHROPLASTY Right 04/27/2023   Procedure: TOTAL KNEE ARTHROPLASTY;  Surgeon: Vickki Hearing, MD;  Location: [facility];  Service: Orthopedics;  Laterality: Right;   Patient Active Problem List   Diagnosis Date Noted   Primary osteoarthritis of right knee 04/27/2023   Osteoarthritis of right knee 04/27/2023   Malignant neoplasm of upper-inner quadrant of right breast in female, estrogen receptor positive (HCC) 08/13/2022   Urinary incontinence, mixed 08/13/2022   Obesity, diabetes, and hypertension syndrome (HCC) 08/13/2022   Former tobacco use 08/13/2022   Vitamin B12 deficiency  01/29/2021   S/P right knee arthroscopy 10/18/20 10/18/2020   Acute lateral meniscus tear of right knee    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 08/23/2020   Colorectal polyps    Personal history of colonic polyps 02/14/2020   Stage 3a chronic kidney disease (HCC) 07/21/2017   Special screening for malignant neoplasms, colon    Kidney stone 01/13/2017   Class 1 obesity due to excess calories with serious comorbidity in adult 07/16/2016   Actinic keratosis 01/12/2016   Benign essential hypertension 01/12/2016   Gastroesophageal reflux disease without esophagitis 01/12/2016   Mixed hyperlipidemia 01/12/2016   Osteopenia 01/12/2016   Seborrheic keratoses 01/12/2016   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 (HCC) 10/16/2015   History of cancer of left breast 10/16/2015   Nonspecific abnormal finding in stool contents 01/11/2012   Type 2 diabetes mellitus with hyperlipidemia (HCC) 01/11/2012    PCP: Gwenlyn Found, MD  REFERRING PROVIDER: Vickki Hearing, MD   REFERRING DIAG: Primary osteoarthritis of right knee   THERAPY DIAG:  Stiffness of right knee, not elsewhere classified  Acute pain of right knee  Rationale for Evaluation and Treatment: Rehabilitation  ONSET DATE: 04/27/23  SUBJECTIVE:   SUBJECTIVE STATEMENT: Patient reports that she had a right total knee replacement on 04/27/23. She has been getting home health physical therapy, but that ended on 05/14/23. She saw her surgeon on 05/13/23 and she was told that her knee is "ahead of the game." She has slowly begun using a cane to get around her house. She has been doing her home health exercises at home. She was using a cane for mobility prior to surgery.   PERTINENT HISTORY: Hypertension, diabetes, osteopenia, osteoarthritis, chronic kidney disease, and history of breast cancer PAIN:  Are you having pain? Yes: NPRS scale: 3-4/10 Pain location: right anterior  knee Pain description: intermittent sore with rare sharp pain  Aggravating factors: none known Relieving factors: ice and medication  PRECAUTIONS: None  WEIGHT BEARING RESTRICTIONS: No  FALLS:  Has patient fallen in last 6 months? 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 PATIENT SURVEYS:  FOTO 43.00  COGNITION: Overall cognitive status: Within functional limits for tasks assessed     SENSATION: Patient reports no numbness or tingling  PALPATION: TTP: right quadriceps, IT band, hip adductors, hamstrings, tibia, and gastroc/soleus  LOWER EXTREMITY ROM:  Active ROM Right eval Left eval  Hip flexion    Hip extension    Hip abduction    Hip adduction    Hip internal rotation    Hip external rotation    Knee flexion 105/115 (PROM) 137  Knee extension 13 0  Ankle dorsiflexion    Ankle plantarflexion    Ankle inversion    Ankle eversion     (Blank rows = not tested)  LOWER EXTREMITY MMT: not tested due to surgical condition  GAIT: Assistive device utilized: Environmental consultant - 4 wheeled Level of assistance: Modified independence Comments: step to pattern without AD    TODAY'S TREATMENT:                                                                                                                              DATE:                                     05/17/23 EXERCISE LOG  Exercise Repetitions and  Resistance Comments  Hamstring stretch  3 x 30 seconds   Gastroc stretch  3 x 30 seconds  Blank cell = exercise not performed today   PATIENT EDUCATION:  Education details: HEP, prognosis, healing, plan of care, and goals for therapy Person educated: Patient Education method: Explanation Education comprehension: verbalized understanding  HOME EXERCISE PROGRAM: Reviewed home health HEP  ASSESSMENT:  CLINICAL IMPRESSION: Patient is a 77 y.o. female who was seen today for physical therapy evaluation and treatment following a right total knee arthroplasty on 04/27/23. She presented with limited right knee active range of motion secondary to pain and edema. She continues to require an assistive device to safely ambulate due to gait deviations. Her home exercise program was reviewed and updated with today's interventions. She reported feeling comfortable with these interventions. Recommend that she continue with skilled physical therapy to address her impairments to return to her prior level of function.  OBJECTIVE IMPAIRMENTS: Abnormal gait, decreased activity tolerance, decreased mobility, difficulty walking, decreased ROM, decreased strength, hypomobility, increased edema, and pain.   ACTIVITY LIMITATIONS: carrying, lifting, standing, squatting, stairs, transfers, and locomotion level  PARTICIPATION LIMITATIONS: meal prep, cleaning, laundry, shopping, and community activity  PERSONAL FACTORS: 3+ comorbidities: Hypertension, diabetes, osteopenia, osteoarthritis, chronic kidney disease, and history of breast cancer  are also affecting patient's functional outcome.   REHAB POTENTIAL: Good  CLINICAL DECISION MAKING: Evolving/moderate complexity  EVALUATION COMPLEXITY: Moderate   GOALS: Goals reviewed with patient? Yes  SHORT TERM GOALS: Target date: 06/07/23 Patient will be independent with her initial HEP. Baseline: Goal status: INITIAL  2.  Patient will be able to  demonstrate right knee extension within 10 degrees of neutral for improved knee mobility. Baseline:  Goal status: INITIAL  3.  Patient will be able to safely ambulate at least 80 feet with a single-point cane for improved household mobility. Baseline:  Goal status: INITIAL  4.  Patient will be able to demonstrate at least 112 degrees of active right knee flexion for improved knee mobility. Baseline:  Goal status: INITIAL  LONG TERM GOALS: Target date: 06/28/23  Patient will be independent with her advanced HEP. Baseline:  Goal status: INITIAL  2.  Patient will be able to demonstrate active right knee extension within 5 degrees of neutral for improved gait mechanics. Baseline:  Goal status: INITIAL  3.  Patient will be able to demonstrate at least 120 degrees of right knee flexion for improved function navigating stairs. Baseline:  Goal status: INITIAL  4.  Patient will be able to safely ambulate with no significant gait deviations for improved mobility. Baseline:  Goal status: INITIAL  5.  Patient will be able to navigate at least 4 steps with a reciprocal gait pattern for improved household mobility. Baseline:  Goal status: INITIAL  PLAN:  PT FREQUENCY: 2x/week  PT DURATION: 6 weeks  PLANNED INTERVENTIONS: Therapeutic exercises, Therapeutic activity, Neuromuscular re-education, Balance training, Gait training, Patient/Family education, Self Care, Joint mobilization, Stair training, Electrical stimulation, Cryotherapy, Moist heat, Vasopneumatic device, Manual therapy, and Re-evaluation  PLAN FOR NEXT SESSION: NuStep, manual therapy (focus on knee extension), lower extremity strengthening, gait training, and modalities as needed   Granville Lewis, PT 05/17/2023, 3:39 PM

## 2023-05-18 ENCOUNTER — Encounter (HOSPITAL_COMMUNITY): Payer: Medicare PPO

## 2023-05-19 ENCOUNTER — Other Ambulatory Visit: Payer: Self-pay | Admitting: Hematology

## 2023-05-19 ENCOUNTER — Other Ambulatory Visit: Payer: Self-pay | Admitting: *Deleted

## 2023-05-19 DIAGNOSIS — Z170 Estrogen receptor positive status [ER+]: Secondary | ICD-10-CM

## 2023-05-19 NOTE — Telephone Encounter (Signed)
Anastrozole refill approved.  Patient is tolerating and is to continue therapy.  

## 2023-05-21 ENCOUNTER — Ambulatory Visit: Payer: Medicare PPO

## 2023-05-21 DIAGNOSIS — M25661 Stiffness of right knee, not elsewhere classified: Secondary | ICD-10-CM

## 2023-05-21 DIAGNOSIS — M25561 Pain in right knee: Secondary | ICD-10-CM

## 2023-05-21 NOTE — Therapy (Signed)
OUTPATIENT PHYSICAL THERAPY LOWER EXTREMITY TREATMENT   Patient Name: [name] MRN: [id number] DOB:1945/11/28, 77 y.o., female Today's Date: 05/21/2023  END OF SESSION:  PT End of Session - 05/21/23 1151     Visit Number 2    Number of Visits 12    Date for PT Re-Evaluation 08/13/23    PT Start Time 1145    PT Stop Time 1230    PT Time Calculation (min) 45 min    Activity Tolerance Patient tolerated treatment well    Behavior During Therapy [facility] for tasks assessed/performed              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   History of kidney stones    History of radiation therapy 09-10-2015 to [phone]   left breast 50Gy   Iron deficiency anemia    Malignant neoplasm of lower-inner quadrant of left breast in female, estrogen receptor positive [facility]) oncologist-  dr Minta Balsam higgs ([facility])   dx 08-16-2015--  Stage 1A invasisive (T1c,N0,M0), ER/PR+, HER2 negative---- 06-26-2015 left partial mastectomy , 07-31-2015 left axilla node dissection x1 (negative)-- completed radiation 10-08-2015 and started arimidex 10-16-2015   Mixed hyperlipidemia    Nephrolithiasis    bilateral per CT 04-28-2018   Type 2 diabetes mellitus (HCC)    followed by pcp   Ureteral calculi    left   Urgency of urination    Wears glasses    Past Surgical History:  Procedure Laterality Date   ABDOMINAL HYSTERECTOMY  2005   AXILLARY SENTINEL NODE BIOPSY Left 07/31/2015   Procedure: SENTINEL LYMPH NODE BIOPSY, LEFT AXILLA, ;  Surgeon: Franky Macho, MD;  Location: [facility];  Service: General;  Laterality: Left;  Sentinel Node @  1000   BREAST BIOPSY Left 06/09/2016   Procedure: LEFT BREAST BIOPSY AFTER NEEDLE LOCALIZATION;  Surgeon: Franky Macho, MD;  Location: [facility];  Service: General;  Laterality: Left;   CARPAL TUNNEL RELEASE Left 11/02/2014   Procedure: CARPAL TUNNEL RELEASE;  Surgeon: Dairl Ponder, MD;  Location: [facility];  Service: Orthopedics;  Laterality: Left;   CATARACT EXTRACTION W/ INTRAOCULAR LENS  IMPLANT, BILATERAL  2007   COLONOSCOPY N/A 02/03/2017   Procedure: COLONOSCOPY;  Surgeon: West Bali, MD;  [facility];  Service: Endoscopy;  Laterality: N/A;  1245   COLONOSCOPY N/A 03/01/2020   Procedure: COLONOSCOPY;  Surgeon: West Bali, MD;  [facility]: [facility];  Service: Endoscopy;  Laterality: N/A;  10:30am   COLONOSCOPY W/ BIOPSIES AND POLYPECTOMY     CYSTOSCOPY WITH RETROGRADE PYELOGRAM, URETEROSCOPY AND STENT PLACEMENT Left 05/09/2018   Procedure: CYSTOSCOPY WITH RETROGRADE PYELOGRAM, URETEROSCOPY AND STENT PLACEMENT;  Surgeon: Malen Gauze, MD;  Location: [place];  Service: Urology;  Laterality: Left;   CYSTOSCOPY/URETEROSCOPY/HOLMIUM LASER/STENT PLACEMENT Bilateral 03/18/2017   Procedure: CYSTOSCOPY/ BILATERAL RETROGRADE/RIGHT URETEROSCOPY/ RIGHT HOLMIUM LASER APPLICATION/RIGHT URETERAL STENT PLACEMENT;  Surgeon: Bjorn Pippin, MD;  Location: [facility];  Service: Urology;  Laterality: Bilateral;   EXTRACORPOREAL SHOCK WAVE LITHOTRIPSY Right 12/14/2019   Procedure: EXTRACORPOREAL  SHOCK WAVE LITHOTRIPSY (ESWL);  Surgeon: Jerilee Field, MD;  Location: [facility];  Service: Urology;  Laterality: Right;   EXTRACORPOREAL SHOCK WAVE LITHOTRIPSY Left 02/01/2020   Procedure: EXTRACORPOREAL SHOCK WAVE LITHOTRIPSY (ESWL);  Surgeon: Sebastian Ache, MD;  [facility];  Service: Urology;  Laterality: Left;   HOLMIUM LASER APPLICATION Left 05/09/2018   Procedure: HOLMIUM LASER APPLICATION;  Surgeon: Malen Gauze, MD;  Location:  [facility];  Service: Urology;  Laterality: Left;   KNEE ARTHROSCOPY WITH LATERAL MENISECTOMY Right 10/18/2020   Procedure: KNEE ARTHROSCOPY WITH LATERAL MENISECTOMY;  Surgeon: Vickki Hearing, MD;  Location: [facility];  Service: Orthopedics;  Laterality: Right;   OPEN REDUCTION INTERNAL FIXATION (ORIF) DISTAL RADIAL FRACTURE Left 11/02/2014   Procedure: OPEN REDUCTION INTERNAL FIXATION (ORIF) DISTAL RADIAL FRACTURE;  Surgeon: Dairl Ponder, MD;  [facility];  Service: Orthopedics;  Laterality: Left;   PARTIAL MASTECTOMY WITH NEEDLE LOCALIZATION Left 06/26/2015   Procedure: PARTIAL MASTECTOMY WITH NEEDLE LOCALIZATION;  Surgeon: Franky Macho, MD;  Location: [facility];  Service: General;  Laterality: Left;  Needle Loc @ 8:00am   POLYPECTOMY  02/03/2017   Procedure: POLYPECTOMY;  Surgeon: West Bali, MD;  [facility];  Service: Endoscopy;;  descending and hepatic flexure   POLYPECTOMY  03/01/2020   Procedure: POLYPECTOMY;  Surgeon: West Bali, MD;  [facility];  Service: Endoscopy;;  ascending;splenic flexure polyps x2;rectal   TARSAL METATARSAL ARTHRODESIS Left 09-07-2007   dr Lestine Box  [facility]   first and second tarsal metatarsal fusion, gastroc slide, neurolysis   TOTAL ABDOMINAL HYSTERECTOMY W/ BILATERAL SALPINGOOPHORECTOMY  10-21-2004   dr soper  [facility]   TOTAL KNEE ARTHROPLASTY Right 04/27/2023   Procedure: TOTAL KNEE ARTHROPLASTY;  Surgeon: Vickki Hearing, MD;  Location: [facility];  Service: Orthopedics;  Laterality: Right;   Patient Active Problem List   Diagnosis Date Noted   Primary osteoarthritis of right knee 04/27/2023   Osteoarthritis of right knee 04/27/2023   Malignant neoplasm of upper-inner quadrant of right breast in female, estrogen receptor positive (HCC) 08/13/2022   Urinary incontinence, mixed 08/13/2022   Obesity, diabetes, and hypertension syndrome (HCC) 08/13/2022   Former tobacco use 08/13/2022   Vitamin B12 deficiency  01/29/2021   S/P right knee arthroscopy 10/18/20 10/18/2020   Acute lateral meniscus tear of right knee    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 08/23/2020   Colorectal polyps    Personal history of colonic polyps 02/14/2020   Stage 3a chronic kidney disease (HCC) 07/21/2017   Special screening for malignant neoplasms, colon    Kidney stone 01/13/2017   Class 1 obesity due to excess calories with serious comorbidity in adult 07/16/2016   Actinic keratosis 01/12/2016   Benign essential hypertension 01/12/2016   Gastroesophageal reflux disease without esophagitis 01/12/2016   Mixed hyperlipidemia 01/12/2016   Osteopenia 01/12/2016   Seborrheic keratoses 01/12/2016   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 (HCC) 10/16/2015   History of cancer of left breast 10/16/2015   Nonspecific abnormal finding in stool contents 01/11/2012   Type 2 diabetes mellitus with hyperlipidemia (HCC) 01/11/2012    PCP: Gwenlyn Found, MD  REFERRING PROVIDER: Vickki Hearing, MD   REFERRING DIAG: Primary osteoarthritis of right knee   THERAPY DIAG:  Stiffness of right knee, not elsewhere classified  Acute pain of right knee  Rationale for Evaluation and Treatment: Rehabilitation  ONSET DATE: 04/27/23  SUBJECTIVE:   SUBJECTIVE STATEMENT: Patient reports that her knee is hurting a little bit from walking after breakfast today.   PERTINENT HISTORY: Hypertension, diabetes, osteopenia, osteoarthritis, chronic kidney disease, and history of breast cancer PAIN:  Are you having pain? Yes: NPRS scale: 4/10 Pain location: right anterior knee Pain description: intermittent sore with rare sharp pain  Aggravating factors: none known Relieving factors: ice and medication  PRECAUTIONS: None  WEIGHT BEARING RESTRICTIONS: No  FALLS:  Has patient fallen in last 6 months? 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 PATIENT SURVEYS:  FOTO 43.00  COGNITION: Overall cognitive status: Within functional limits for tasks assessed     SENSATION: Patient reports no numbness or tingling  PALPATION: TTP: right quadriceps, IT band, hip adductors, hamstrings, tibia, and gastroc/soleus  LOWER EXTREMITY ROM:  Active ROM Right eval Left eval  Hip flexion    Hip extension    Hip abduction    Hip adduction    Hip internal rotation    Hip external rotation    Knee flexion 105/115 (PROM) 137  Knee extension 13 0  Ankle dorsiflexion    Ankle plantarflexion    Ankle inversion    Ankle eversion     (Blank rows = not tested)  LOWER EXTREMITY MMT: not tested due to surgical condition  GAIT: Assistive device utilized: Environmental consultant - 4 wheeled Level of assistance: Modified independence Comments: step to pattern without AD    TODAY'S TREATMENT:                                                                                                                              DATE:                                     05/21/23 EXERCISE LOG  Exercise Repetitions and Resistance Comments  Nustep  L3 x 15.5 minutes; seat 9-8   Rocker board  2.5 minutes   LAQ 25 reps   Seated hamstring stretch 4 x 30 seconds LLE only   Seated HS curl  Green t-band x 2 minutes     Blank cell = exercise not  performed today  Manual Therapy Soft Tissue Mobilization: scar mobilization, for improved scar mobility  05/17/23 EXERCISE LOG  Exercise Repetitions and Resistance Comments  Hamstring stretch  3 x 30 seconds   Gastroc stretch  3 x 30 seconds                Blank cell = exercise not performed today   PATIENT EDUCATION:  Education details: expectation for soreness after therapy and concert this weekend, healing, and scar mobilization Person educated: Patient Education method: Explanation Education comprehension: verbalized understanding  HOME EXERCISE PROGRAM: Reviewed home health HEP  ASSESSMENT:  CLINICAL IMPRESSION: Patient was introduced to multiple new interventions for improved knee mobility. She required minimal cueing with today's new interventions for proper exercise performance to avoid compensatory patterns. Manual therapy focused on scar mobilization and she was educated on how to perform this at home to promote soft tissue extensibility. She experienced no significant increase in pain or discomfort with any of today's interventions. She reported that her knee felt "about the same" upon conclusion of treatment. Recommend that she continue with skilled physical therapy to address her remaining impairments to return to her prior level of function.  OBJECTIVE IMPAIRMENTS: Abnormal gait, decreased activity tolerance, decreased mobility, difficulty walking, decreased ROM, decreased strength, hypomobility, increased edema, and pain.   ACTIVITY LIMITATIONS: carrying, lifting, standing, squatting, stairs, transfers, and locomotion level  PARTICIPATION LIMITATIONS: meal prep, cleaning, laundry, shopping, and community activity  PERSONAL FACTORS: 3+ comorbidities: Hypertension, diabetes, osteopenia, osteoarthritis, chronic kidney disease, and history of breast cancer  are also affecting patient's functional outcome.   REHAB POTENTIAL:  Good  CLINICAL DECISION MAKING: Evolving/moderate complexity  EVALUATION COMPLEXITY: Moderate   GOALS: Goals reviewed with patient? Yes  SHORT TERM GOALS: Target date: 06/07/23 Patient will be independent with her initial HEP. Baseline: Goal status: INITIAL  2.  Patient will be able to demonstrate right knee extension within 10 degrees of neutral for improved knee mobility. Baseline:  Goal status: INITIAL  3.  Patient will be able to safely ambulate at least 80 feet with a single-point cane for improved household mobility. Baseline:  Goal status: INITIAL  4.  Patient will be able to demonstrate at least 112 degrees of active right knee flexion for improved knee mobility. Baseline:  Goal status: INITIAL  LONG TERM GOALS: Target date: 06/28/23  Patient will be independent with her advanced HEP. Baseline:  Goal status: INITIAL  2.  Patient will be able to demonstrate active right knee extension within 5 degrees of neutral for improved gait mechanics. Baseline:  Goal status: INITIAL  3.  Patient will be able to demonstrate at least 120 degrees of right knee flexion for improved function navigating stairs. Baseline:  Goal status: INITIAL  4.  Patient will be able to safely ambulate with no significant gait deviations for improved mobility. Baseline:  Goal status: INITIAL  5.  Patient will be able to navigate at least 4 steps with a reciprocal gait pattern for improved household mobility. Baseline:  Goal status: INITIAL  PLAN:  PT FREQUENCY: 2x/week  PT DURATION: 6 weeks  PLANNED INTERVENTIONS: Therapeutic exercises, Therapeutic activity, Neuromuscular re-education, Balance training, Gait training, Patient/Family education, Self Care, Joint mobilization, Stair training, Electrical stimulation, Cryotherapy, Moist heat, Vasopneumatic device, Manual therapy, and Re-evaluation  PLAN FOR NEXT SESSION: NuStep, manual therapy (focus on knee extension), lower extremity  strengthening, gait training, and modalities as needed   Granville Lewis, PT 05/21/2023, 1:26 PM

## 2023-05-24 ENCOUNTER — Encounter (HOSPITAL_COMMUNITY): Payer: Medicare PPO | Admitting: Physical Therapy

## 2023-05-25 ENCOUNTER — Encounter: Payer: Self-pay | Admitting: Physical Therapy

## 2023-05-25 ENCOUNTER — Ambulatory Visit: Payer: Medicare PPO | Admitting: Physical Therapy

## 2023-05-25 DIAGNOSIS — M25661 Stiffness of right knee, not elsewhere classified: Secondary | ICD-10-CM | POA: Diagnosis not present

## 2023-05-25 DIAGNOSIS — M25561 Pain in right knee: Secondary | ICD-10-CM

## 2023-05-25 NOTE — Therapy (Signed)
OUTPATIENT PHYSICAL THERAPY LOWER EXTREMITY TREATMENT   Patient Name: [name] MRN: [id number] DOB:04-Apr-1946, 77 y.o., female Today's Date: 05/25/2023  END OF SESSION:  PT End of Session - 05/25/23 1023     Visit Number 3    Number of Visits 12    Date for PT Re-Evaluation 08/13/23    PT Start Time 1015    PT Stop Time 1102    PT Time Calculation (min) 47 min    Activity Tolerance Patient tolerated treatment well    Behavior During Therapy [facility] for tasks assessed/performed              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   History of kidney stones    History of radiation therapy 09-10-2015 to [id number]   left breast 50Gy   Iron deficiency anemia    Malignant neoplasm of lower-inner quadrant of left breast in female, estrogen receptor positive [facility]) oncologist-  dr Minta Balsam higgs ([facility])   dx 08-16-2015--  Stage 1A invasisive (T1c,N0,M0), ER/PR+, HER2 negative---- 06-26-2015 left partial mastectomy , 07-31-2015 left axilla node dissection x1 (negative)-- completed radiation 10-08-2015 and started arimidex 10-16-2015   Mixed hyperlipidemia    Nephrolithiasis    bilateral per CT 04-28-2018   Type 2 diabetes mellitus (HCC)    followed by pcp   Ureteral calculi    left   Urgency of urination    Wears glasses    Past Surgical History:  Procedure Laterality Date   ABDOMINAL HYSTERECTOMY  2005   AXILLARY SENTINEL NODE BIOPSY Left 07/31/2015   Procedure: SENTINEL LYMPH NODE BIOPSY, LEFT AXILLA, ;  Surgeon: Franky Macho, MD;  Location: [facility];  Service: General;  Laterality: Left;  Sentinel Node @  1000   BREAST BIOPSY Left 06/09/2016   Procedure: LEFT BREAST BIOPSY AFTER NEEDLE LOCALIZATION;  Surgeon: Franky Macho, MD;  Location: [facility];  Service: General;  Laterality: Left;   CARPAL TUNNEL RELEASE Left 11/02/2014   Procedure: CARPAL TUNNEL RELEASE;  Surgeon: Dairl Ponder, MD;  Location: [facility];  Service: Orthopedics;  Laterality: Left;   CATARACT EXTRACTION W/ INTRAOCULAR LENS  IMPLANT, BILATERAL  2007   COLONOSCOPY N/A 02/03/2017   Procedure: COLONOSCOPY;  Surgeon: West Bali, MD;  [facility]: [facility];  Service: Endoscopy;  Laterality: N/A;  1245   COLONOSCOPY N/A 03/01/2020   Procedure: COLONOSCOPY;  Surgeon: West Bali, MD;  [facility]: [facility];  Service: Endoscopy;  Laterality: N/A;  10:30am   COLONOSCOPY W/ BIOPSIES AND POLYPECTOMY     CYSTOSCOPY WITH RETROGRADE PYELOGRAM, URETEROSCOPY AND STENT PLACEMENT Left 05/09/2018   Procedure: CYSTOSCOPY WITH RETROGRADE PYELOGRAM, URETEROSCOPY AND STENT PLACEMENT;  Surgeon: Malen Gauze, MD;  Location: [facility];  Service: Urology;  Laterality: Left;   CYSTOSCOPY/URETEROSCOPY/HOLMIUM LASER/STENT PLACEMENT Bilateral 03/18/2017   Procedure: CYSTOSCOPY/ BILATERAL RETROGRADE/RIGHT URETEROSCOPY/ RIGHT HOLMIUM LASER APPLICATION/RIGHT URETERAL STENT PLACEMENT;  Surgeon: Bjorn Pippin, MD;  Location: [facility];  Service: Urology;  Laterality: Bilateral;   EXTRACORPOREAL SHOCK WAVE LITHOTRIPSY Right 12/14/2019   Procedure: EXTRACORPOREAL  SHOCK WAVE LITHOTRIPSY (ESWL);  Surgeon: Jerilee Field, MD;  Location: [facility];  Service: Urology;  Laterality: Right;   EXTRACORPOREAL SHOCK WAVE LITHOTRIPSY Left 02/01/2020   Procedure: EXTRACORPOREAL SHOCK WAVE LITHOTRIPSY (ESWL);  Surgeon: Sebastian Ache, MD;  Location: [facility];  Service: Urology;  Laterality: Left;   HOLMIUM LASER APPLICATION Left 05/09/2018   Procedure: HOLMIUM LASER APPLICATION;  Surgeon: Malen Gauze, MD;  Location:  [facility];  Service: Urology;  Laterality: Left;   KNEE ARTHROSCOPY WITH LATERAL MENISECTOMY Right 10/18/2020   Procedure: KNEE ARTHROSCOPY WITH LATERAL MENISECTOMY;  Surgeon: Vickki Hearing, MD;  Location: [facility];  Service: Orthopedics;  Laterality: Right;   OPEN REDUCTION INTERNAL FIXATION (ORIF) DISTAL RADIAL FRACTURE Left 11/02/2014   Procedure: OPEN REDUCTION INTERNAL FIXATION (ORIF) DISTAL RADIAL FRACTURE;  Surgeon: Dairl Ponder, MD;  [facility];  Service: Orthopedics;  Laterality: Left;   PARTIAL MASTECTOMY WITH NEEDLE LOCALIZATION Left 06/26/2015   Procedure: PARTIAL MASTECTOMY WITH NEEDLE LOCALIZATION;  Surgeon: Franky Macho, MD;  Location: [facility];  Service: General;  Laterality: Left;  Needle Loc @ 8:00am   POLYPECTOMY  02/03/2017   Procedure: POLYPECTOMY;  Surgeon: West Bali, MD;  [facility];  Service: Endoscopy;;  descending and hepatic flexure   POLYPECTOMY  03/01/2020   Procedure: POLYPECTOMY;  Surgeon: West Bali, MD;  [facility];  Service: Endoscopy;;  ascending;splenic flexure polyps x2;rectal   TARSAL METATARSAL ARTHRODESIS Left 09-07-2007   dr Lestine Box  [facility]   first and second tarsal metatarsal fusion, gastroc slide, neurolysis   TOTAL ABDOMINAL HYSTERECTOMY W/ BILATERAL SALPINGOOPHORECTOMY  10-21-2004   dr soper  [facility]   TOTAL KNEE ARTHROPLASTY Right 04/27/2023   Procedure: TOTAL KNEE ARTHROPLASTY;  Surgeon: Vickki Hearing, MD;  Location: [facility];  Service: Orthopedics;  Laterality: Right;   Patient Active Problem List   Diagnosis Date Noted   Primary osteoarthritis of right knee 04/27/2023   Osteoarthritis of right knee 04/27/2023   Malignant neoplasm of upper-inner quadrant of right breast in female, estrogen receptor positive (HCC) 08/13/2022   Urinary incontinence, mixed 08/13/2022   Obesity, diabetes, and hypertension syndrome (HCC) 08/13/2022   Former tobacco use 08/13/2022   Vitamin B12 deficiency  01/29/2021   S/P right knee arthroscopy 10/18/20 10/18/2020   Acute lateral meniscus tear of right knee    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 08/23/2020   Colorectal polyps    Personal history of colonic polyps 02/14/2020   Stage 3a chronic kidney disease (HCC) 07/21/2017   Special screening for malignant neoplasms, colon    Kidney stone 01/13/2017   Class 1 obesity due to excess calories with serious comorbidity in adult 07/16/2016   Actinic keratosis 01/12/2016   Benign essential hypertension 01/12/2016   Gastroesophageal reflux disease without esophagitis 01/12/2016   Mixed hyperlipidemia 01/12/2016   Osteopenia 01/12/2016   Seborrheic keratoses 01/12/2016   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 (HCC) 10/16/2015   History of cancer of left breast 10/16/2015   Nonspecific abnormal finding in stool contents 01/11/2012   Type 2 diabetes mellitus with hyperlipidemia (HCC) 01/11/2012    PCP: Gwenlyn Found, MD  REFERRING PROVIDER: Vickki Hearing, MD   REFERRING DIAG: Primary osteoarthritis of right knee   THERAPY DIAG:  Stiffness of right knee, not elsewhere classified  Acute pain of right knee  Rationale for Evaluation and Treatment: Rehabilitation  ONSET DATE: 04/27/23  SUBJECTIVE:   SUBJECTIVE STATEMENT: Doing good.  Walking with cane.  PERTINENT HISTORY: Hypertension, diabetes, osteopenia, osteoarthritis, chronic kidney disease, and history of breast cancer PAIN:  Are you having pain? Yes: NPRS scale: 4/10 Pain location: right anterior knee Pain description: intermittent sore with rare sharp pain  Aggravating factors: none known Relieving factors: ice and medication  PRECAUTIONS: None  WEIGHT BEARING RESTRICTIONS: No  FALLS:  Has patient fallen in last 6 months? 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 PATIENT SURVEYS:  FOTO 43.00  COGNITION: Overall cognitive status: Within functional limits for tasks assessed     SENSATION: Patient reports no numbness or tingling  PALPATION: TTP: right quadriceps, IT band, hip adductors, hamstrings, tibia, and gastroc/soleus  LOWER EXTREMITY ROM:  Active ROM Right eval Left eval  Hip flexion    Hip extension    Hip abduction    Hip adduction    Hip internal rotation    Hip external rotation    Knee flexion 105/115 (PROM) 137  Knee extension 13 0  Ankle dorsiflexion    Ankle plantarflexion    Ankle inversion    Ankle eversion     (Blank rows = not tested)  LOWER EXTREMITY MMT: not tested due to surgical condition  GAIT: Assistive device utilized: Environmental consultant - 4 wheeled Level of assistance: Modified independence Comments: step to pattern without AD    TODAY'S TREATMENT:                                                                                                                              DATE:                                     05/25/23 EXERCISE LOG  Exercise Repetitions and Resistance Comments  Nustep  L3 x 15 moving seat forward x 2 to increase flexion.   Knee ext 10# x 2 minutes   Ham curls 30# x 2 minutes.       In supine:  PROM (low load long duration stretching technique) x 8 minutes into right knee flexion and extension f/b  LE elevation and  vasopneumatic x 15 minutes.Marland Kitchen  PATIENT EDUCATION:  Education details: expectation for soreness after therapy and concert  this weekend, healing, and scar mobilization Person educated: Patient Education method: Explanation Education comprehension: verbalized understanding  HOME EXERCISE PROGRAM: Reviewed home health HEP  ASSESSMENT:  CLINICAL IMPRESSION: Patient was introduced to multiple new interventions including knee extension and hamstring curl machine.  She is pleased with her progress.  She is lacking right knee extension and we discussed her remaining compliant to her HEP.  OBJECTIVE IMPAIRMENTS: Abnormal gait, decreased activity tolerance, decreased mobility, difficulty walking, decreased ROM, decreased strength, hypomobility, increased edema, and pain.   ACTIVITY LIMITATIONS: carrying, lifting, standing, squatting, stairs, transfers, and locomotion level  PARTICIPATION LIMITATIONS: meal prep, cleaning, laundry, shopping, and community activity  PERSONAL FACTORS: 3+ comorbidities: Hypertension, diabetes, osteopenia, osteoarthritis, chronic kidney disease, and history of breast cancer  are also affecting patient's functional outcome.   REHAB POTENTIAL: Good  CLINICAL DECISION MAKING: Evolving/moderate complexity  EVALUATION COMPLEXITY: Moderate   GOALS: Goals reviewed with patient? Yes  SHORT TERM GOALS: Target date: 06/07/23 Patient will be independent with her initial HEP. Baseline: Goal status: INITIAL  2.  Patient will be able to demonstrate right knee extension within 10 degrees of neutral for improved knee mobility. Baseline:  Goal status: INITIAL  3.  Patient will be able to safely ambulate at least 80 feet with a single-point cane for improved household mobility. Baseline:  Goal status: INITIAL  4.  Patient will be able to demonstrate at least 112 degrees of active right knee flexion for improved knee mobility. Baseline:  Goal status: INITIAL  LONG TERM  GOALS: Target date: 06/28/23  Patient will be independent with her advanced HEP. Baseline:  Goal status: INITIAL  2.  Patient will be able to demonstrate active right knee extension within 5 degrees of neutral for improved gait mechanics. Baseline:  Goal status: INITIAL  3.  Patient will be able to demonstrate at least 120 degrees of right knee flexion for improved function navigating stairs. Baseline:  Goal status: INITIAL  4.  Patient will be able to safely ambulate with no significant gait deviations for improved mobility. Baseline:  Goal status: INITIAL  5.  Patient will be able to navigate at least 4 steps with a reciprocal gait pattern for improved household mobility. Baseline:  Goal status: INITIAL  PLAN:  PT FREQUENCY: 2x/week  PT DURATION: 6 weeks  PLANNED INTERVENTIONS: Therapeutic exercises, Therapeutic activity, Neuromuscular re-education, Balance training, Gait training, Patient/Family education, Self Care, Joint mobilization, Stair training, Electrical stimulation, Cryotherapy, Moist heat, Vasopneumatic device, Manual therapy, and Re-evaluation  PLAN FOR NEXT SESSION: NuStep, manual therapy (focus on knee extension), lower extremity strengthening, gait training, and modalities as needed   Mickel Schreur, [place], PT 05/25/2023, 11:04 AM

## 2023-05-26 ENCOUNTER — Encounter (HOSPITAL_COMMUNITY): Payer: Medicare PPO | Admitting: Physical Therapy

## 2023-05-27 ENCOUNTER — Ambulatory Visit: Payer: Medicare PPO

## 2023-05-27 DIAGNOSIS — M25661 Stiffness of right knee, not elsewhere classified: Secondary | ICD-10-CM

## 2023-05-27 DIAGNOSIS — M25561 Pain in right knee: Secondary | ICD-10-CM

## 2023-05-27 NOTE — Therapy (Signed)
OUTPATIENT PHYSICAL THERAPY LOWER EXTREMITY TREATMENT   Patient Name: [name] MRN: [id number] DOB:[birth year], 77 y.o., female Today's Date: 05/27/2023  END OF SESSION:  PT End of Session - 05/27/23 1017     Visit Number 4    Number of Visits 12    Date for PT Re-Evaluation 08/13/23    PT Start Time 1015    PT Stop Time 1114    PT Time Calculation (min) 59 min    Activity Tolerance Patient tolerated treatment well    Behavior During Therapy [facility] for tasks assessed/performed               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   History of kidney stones    History of radiation therapy 09-10-2015 to [phone]   left breast 50Gy   Iron deficiency anemia    Malignant neoplasm of lower-inner quadrant of left breast in female, estrogen receptor positive [facility]) oncologist-  dr Minta Balsam higgs ([facility])   dx 08-16-2015--  Stage 1A invasisive (T1c,N0,M0), ER/PR+, HER2 negative---- 06-26-2015 left partial mastectomy , 07-31-2015 left axilla node dissection x1 (negative)-- completed radiation 10-08-2015 and started arimidex 10-16-2015   Mixed hyperlipidemia    Nephrolithiasis    bilateral per CT 04-28-2018   Type 2 diabetes mellitus (HCC)    followed by pcp   Ureteral calculi    left   Urgency of urination    Wears glasses    Past Surgical History:  Procedure Laterality Date   ABDOMINAL HYSTERECTOMY  2005   AXILLARY SENTINEL NODE BIOPSY Left 07/31/2015   Procedure: SENTINEL LYMPH NODE BIOPSY, LEFT AXILLA, ;  Surgeon: Franky Macho, MD;  Location: [facility];  Service: General;  Laterality: Left;  Sentinel Node @  1000   BREAST BIOPSY Left 06/09/2016   Procedure: LEFT BREAST BIOPSY AFTER NEEDLE LOCALIZATION;  Surgeon: Franky Macho, MD;  Location: [facility];  Service: General;  Laterality: Left;   CARPAL TUNNEL RELEASE Left 11/02/2014   Procedure: CARPAL TUNNEL RELEASE;  Surgeon: Dairl Ponder, MD;  Location: [facility];  Service: Orthopedics;  Laterality: Left;   CATARACT EXTRACTION W/ INTRAOCULAR LENS  IMPLANT, BILATERAL  2007   COLONOSCOPY N/A 02/03/2017   Procedure: COLONOSCOPY;  Surgeon: West Bali, MD;  [facility]: [facility];  Service: Endoscopy;  Laterality: N/A;  1245   COLONOSCOPY N/A 03/01/2020   Procedure: COLONOSCOPY;  Surgeon: West Bali, MD;  [facility]: [facility];  Service: Endoscopy;  Laterality: N/A;  10:30am   COLONOSCOPY W/ BIOPSIES AND POLYPECTOMY     CYSTOSCOPY WITH RETROGRADE PYELOGRAM, URETEROSCOPY AND STENT PLACEMENT Left 05/09/2018   Procedure: CYSTOSCOPY WITH RETROGRADE PYELOGRAM, URETEROSCOPY AND STENT PLACEMENT;  Surgeon: Malen Gauze, MD;  Location: [facility];  Service: Urology;  Laterality: Left;   CYSTOSCOPY/URETEROSCOPY/HOLMIUM LASER/STENT PLACEMENT Bilateral 03/18/2017   Procedure: CYSTOSCOPY/ BILATERAL RETROGRADE/RIGHT URETEROSCOPY/ RIGHT HOLMIUM LASER APPLICATION/RIGHT URETERAL STENT PLACEMENT;  Surgeon: Bjorn Pippin, MD;  Location: [facility];  Service: Urology;  Laterality: Bilateral;   EXTRACORPOREAL SHOCK WAVE LITHOTRIPSY Right 12/14/2019   Procedure:  EXTRACORPOREAL SHOCK WAVE LITHOTRIPSY (ESWL);  Surgeon: Jerilee Field, MD;  Location: [facility];  Service: Urology;  Laterality: Right;   EXTRACORPOREAL SHOCK WAVE LITHOTRIPSY Left 02/01/2020   Procedure: EXTRACORPOREAL SHOCK WAVE LITHOTRIPSY (ESWL);  Surgeon: Sebastian Ache, MD;  Location: [facility];  Service: Urology;  Laterality: Left;   HOLMIUM LASER APPLICATION Left 05/09/2018   Procedure: HOLMIUM LASER APPLICATION;  Surgeon: Malen Gauze, MD;  Location:  [facility];  Service: Urology;  Laterality: Left;   KNEE ARTHROSCOPY WITH LATERAL MENISECTOMY Right 10/18/2020   Procedure: KNEE ARTHROSCOPY WITH LATERAL MENISECTOMY;  Surgeon: Vickki Hearing, MD;  Location: [facility];  Service: Orthopedics;  Laterality: Right;   OPEN REDUCTION INTERNAL FIXATION (ORIF) DISTAL RADIAL FRACTURE Left 11/02/2014   Procedure: OPEN REDUCTION INTERNAL FIXATION (ORIF) DISTAL RADIAL FRACTURE;  Surgeon: Dairl Ponder, MD;  [facility]: [facility];  Service: Orthopedics;  Laterality: Left;   PARTIAL MASTECTOMY WITH NEEDLE LOCALIZATION Left 06/26/2015   Procedure: PARTIAL MASTECTOMY WITH NEEDLE LOCALIZATION;  Surgeon: Franky Macho, MD;  Location: [facility];  Service: General;  Laterality: Left;  Needle Loc @ 8:00am   POLYPECTOMY  02/03/2017   Procedure: POLYPECTOMY;  Surgeon: West Bali, MD;  [facility];  Service: Endoscopy;;  descending and hepatic flexure   POLYPECTOMY  03/01/2020   Procedure: POLYPECTOMY;  Surgeon: West Bali, MD;  [facility];  Service: Endoscopy;;  ascending;splenic flexure polyps x2;rectal   TARSAL METATARSAL ARTHRODESIS Left 09-07-2007   dr Lestine Box  [facility]   first and second tarsal metatarsal fusion, gastroc slide, neurolysis   TOTAL ABDOMINAL HYSTERECTOMY W/ BILATERAL SALPINGOOPHORECTOMY  10-21-2004   dr soper  [facility]   TOTAL KNEE ARTHROPLASTY Right 04/27/2023   Procedure: TOTAL KNEE ARTHROPLASTY;  Surgeon: Vickki Hearing, MD;  Location: [facility];  Service: Orthopedics;  Laterality: Right;   Patient Active Problem List   Diagnosis Date Noted   Primary osteoarthritis of right knee 04/27/2023   Osteoarthritis of right knee 04/27/2023   Malignant neoplasm of upper-inner quadrant of right breast in female, estrogen receptor positive (HCC) 08/13/2022   Urinary incontinence, mixed 08/13/2022   Obesity, diabetes, and hypertension syndrome (HCC) 08/13/2022   Former tobacco use 08/13/2022   Vitamin B12 deficiency  01/29/2021   S/P right knee arthroscopy 10/18/20 10/18/2020   Acute lateral meniscus tear of right knee    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 08/23/2020   Colorectal polyps    Personal history of colonic polyps 02/14/2020   Stage 3a chronic kidney disease (HCC) 07/21/2017   Special screening for malignant neoplasms, colon    Kidney stone 01/13/2017   Class 1 obesity due to excess calories with serious comorbidity in adult 07/16/2016   Actinic keratosis 01/12/2016   Benign essential hypertension 01/12/2016   Gastroesophageal reflux disease without esophagitis 01/12/2016   Mixed hyperlipidemia 01/12/2016   Osteopenia 01/12/2016   Seborrheic keratoses 01/12/2016   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 (HCC) 10/16/2015   History of cancer of left breast 10/16/2015   Nonspecific abnormal finding in stool contents 01/11/2012   Type 2 diabetes mellitus with hyperlipidemia (HCC) 01/11/2012    PCP: Gwenlyn Found, MD  REFERRING PROVIDER: Vickki Hearing, MD   REFERRING DIAG: Primary osteoarthritis of right knee   THERAPY DIAG:  Stiffness of right knee, not elsewhere classified  Acute pain of right knee  Rationale for Evaluation and Treatment: Rehabilitation  ONSET DATE:  04/27/23  SUBJECTIVE:   SUBJECTIVE STATEMENT: Patient reports that her knee is hurting a little bit, but she has noticed that her swelling is steadily going down.   PERTINENT HISTORY: Hypertension, diabetes, osteopenia, osteoarthritis, chronic kidney disease, and history of breast cancer PAIN:  Are you having pain? Yes: NPRS scale: 3/10 Pain location: right anterior knee Pain description: intermittent sore with rare sharp pain  Aggravating factors: none known Relieving factors: ice and medication  PRECAUTIONS: None  WEIGHT BEARING RESTRICTIONS: No  FALLS:  Has patient fallen in last 6 months? 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 PATIENT SURVEYS:  FOTO [phone]  COGNITION: Overall cognitive status: Within functional limits for tasks assessed     SENSATION: Patient reports no numbness or tingling  PALPATION: TTP: right quadriceps, IT band, hip adductors, hamstrings, tibia, and gastroc/soleus  LOWER EXTREMITY ROM:  Active ROM Right eval Left eval  Hip flexion    Hip extension    Hip abduction    Hip adduction    Hip internal rotation    Hip external rotation    Knee flexion 105/115 (PROM) 137  Knee extension 13 0  Ankle dorsiflexion    Ankle plantarflexion    Ankle inversion    Ankle eversion     (Blank rows = not tested)  LOWER EXTREMITY MMT: not tested due to surgical condition  GAIT: Assistive device utilized: Environmental consultant - 4 wheeled Level of assistance: Modified independence Comments: step to pattern without AD    TODAY'S TREATMENT:                                                                                                                              DATE:                                     05/27/23 EXERCISE LOG  Exercise Repetitions and Resistance Comments  Nustep L4 x 18 minutes; seat 7   Seated HS stretch  4 x 30 seconds   Cybex knee extension 10# x 2 x 1 minute    Cybex knee flexion 30# x 3 minutes   Gastroc stretch  2 minutes    Side stepping  2 minutes    Blank cell =  exercise not performed today  Modalities: no adverse reaction to today's modalities  Date:  Vaso: Knee, 34 degrees; low pressure, 15 mins, Pain and Edema                                   05/25/23 EXERCISE LOG  Exercise Repetitions and Resistance Comments  Nustep  L3 x 15 moving seat forward x 2 to increase flexion.   Knee ext 10# x 2 minutes   Ham curls 30# x 2 minutes.       In supine:  PROM (low load long duration stretching technique) x 8 minutes into right knee flexion and extension f/b  LE elevation and vasopneumatic x 15 minutes.Marland Kitchen  PATIENT EDUCATION:  Education details: healing, prognosis, and progress with therapy Person educated: Patient Education method: Explanation Education comprehension: verbalized understanding  HOME EXERCISE PROGRAM: Reviewed home health HEP  ASSESSMENT:  CLINICAL IMPRESSION: Patient was progressed with new and familiar interventions for improved lower extremity strength and mobility. She required minimal cueing with a seated hamstring stretch for proper biomechanics to promote improved soft tissue extensibility. She experienced no significant increase in pain or discomfort with any of today's interventions. She reported that her knee felt good upon the conclusion of treatment. She continues to require skilled physical therapy to address her remaining impairments to return to her prior level of function.   OBJECTIVE IMPAIRMENTS: Abnormal gait, decreased activity tolerance, decreased mobility, difficulty walking, decreased ROM, decreased strength, hypomobility, increased edema, and pain.   ACTIVITY LIMITATIONS: carrying, lifting, standing, squatting, stairs, transfers, and locomotion level  PARTICIPATION LIMITATIONS: meal prep, cleaning, laundry, shopping, and community activity  PERSONAL FACTORS: 3+ comorbidities: Hypertension, diabetes, osteopenia, osteoarthritis, chronic kidney disease, and history of  breast cancer  are also affecting patient's functional outcome.   REHAB POTENTIAL: Good  CLINICAL DECISION MAKING: Evolving/moderate complexity  EVALUATION COMPLEXITY: Moderate   GOALS: Goals reviewed with patient? Yes  SHORT TERM GOALS: Target date: 06/07/23 Patient will be independent with her initial HEP. Baseline: Goal status: INITIAL  2.  Patient will be able to demonstrate right knee extension within 10 degrees of neutral for improved knee mobility. Baseline:  Goal status: INITIAL  3.  Patient will be able to safely ambulate at least 80 feet with a single-point cane for improved household mobility. Baseline:  Goal status: INITIAL  4.  Patient will be able to demonstrate at least 112 degrees of active right knee flexion for improved knee mobility. Baseline:  Goal status: INITIAL  LONG TERM GOALS: Target date: 06/28/23  Patient will be independent with her advanced HEP. Baseline:  Goal status: INITIAL  2.  Patient will be able to demonstrate active right knee extension within 5 degrees of neutral for improved gait mechanics. Baseline:  Goal status: INITIAL  3.  Patient will be able to demonstrate at least 120 degrees of right knee flexion for improved function navigating stairs. Baseline:  Goal status: INITIAL  4.  Patient will be able to safely ambulate with no significant gait deviations for improved mobility. Baseline:  Goal status: INITIAL  5.  Patient will be able to navigate at least 4 steps with a reciprocal gait pattern for improved household mobility. Baseline:  Goal status: INITIAL  PLAN:  PT FREQUENCY: 2x/week  PT DURATION: 6 weeks  PLANNED INTERVENTIONS: Therapeutic exercises, Therapeutic activity, Neuromuscular re-education, Balance training, Gait training, Patient/Family education, Self Care, Joint mobilization, Stair training, Electrical stimulation, Cryotherapy,  Moist heat, Vasopneumatic device, Manual therapy, and Re-evaluation  PLAN FOR  NEXT SESSION: NuStep, manual therapy (focus on knee extension), lower extremity strengthening, gait training, and modalities as needed   Granville Lewis, PT 05/27/2023, 12:14 PM

## 2023-05-28 ENCOUNTER — Encounter (HOSPITAL_COMMUNITY): Payer: Medicare PPO

## 2023-05-31 ENCOUNTER — Encounter (HOSPITAL_COMMUNITY): Payer: Medicare PPO

## 2023-06-01 ENCOUNTER — Ambulatory Visit (HOSPITAL_COMMUNITY)
Admission: RE | Admit: 2023-06-01 | Discharge: 2023-06-01 | Disposition: A | Discharge status: Home / Self Care | Payer: Medicare PPO | Source: Ambulatory Visit | Attending: Hematology | Admitting: Hematology

## 2023-06-01 ENCOUNTER — Encounter (HOSPITAL_COMMUNITY): Payer: Self-pay

## 2023-06-01 ENCOUNTER — Ambulatory Visit (HOSPITAL_COMMUNITY)
Admission: RE | Admit: 2023-06-01 | Discharge: 2023-06-01 | Disposition: A | Discharge status: Home / Self Care | Payer: Medicare PPO | Source: Ambulatory Visit | Attending: Physician Assistant | Admitting: Physician Assistant

## 2023-06-01 DIAGNOSIS — Z79811 Long term (current) use of aromatase inhibitors: Secondary | ICD-10-CM | POA: Insufficient documentation

## 2023-06-01 DIAGNOSIS — Z170 Estrogen receptor positive status [ER+]: Secondary | ICD-10-CM | POA: Insufficient documentation

## 2023-06-01 DIAGNOSIS — C50312 Malignant neoplasm of lower-inner quadrant of left female breast: Secondary | ICD-10-CM | POA: Diagnosis not present

## 2023-06-02 ENCOUNTER — Other Ambulatory Visit: Payer: Medicare PPO

## 2023-06-02 ENCOUNTER — Encounter (HOSPITAL_COMMUNITY): Payer: Medicare PPO

## 2023-06-02 ENCOUNTER — Ambulatory Visit (HOSPITAL_COMMUNITY): Payer: Medicare PPO

## 2023-06-03 ENCOUNTER — Ambulatory Visit: Payer: Medicare PPO

## 2023-06-03 DIAGNOSIS — M25561 Pain in right knee: Secondary | ICD-10-CM

## 2023-06-03 DIAGNOSIS — M25661 Stiffness of right knee, not elsewhere classified: Secondary | ICD-10-CM

## 2023-06-03 NOTE — Therapy (Signed)
OUTPATIENT PHYSICAL THERAPY LOWER EXTREMITY TREATMENT   Patient Name: [name] MRN: [id number] DOB:12-02-1945, 77 y.o., female Today's Date: 06/03/2023  END OF SESSION:  PT End of Session - 06/03/23 1103     Visit Number 5    Number of Visits 12    Date for PT Re-Evaluation 08/13/23    PT Start Time 1100    PT Stop Time 1157    PT Time Calculation (min) 57 min    Activity Tolerance Patient tolerated treatment well    Behavior During Therapy [facility] for tasks assessed/performed               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   History of kidney stones    History of radiation therapy 09-10-2015 to [phone]   left breast 50Gy   Iron deficiency anemia    Malignant neoplasm of lower-inner quadrant of left breast in female, estrogen receptor positive [facility]) oncologist-  dr Minta Balsam higgs ([facility])   dx 08-16-2015--  Stage 1A invasisive (T1c,N0,M0), ER/PR+, HER2 negative---- 06-26-2015 left partial mastectomy , 07-31-2015 left axilla node dissection x1 (negative)-- completed radiation 10-08-2015 and started arimidex 10-16-2015   Mixed hyperlipidemia    Nephrolithiasis    bilateral per CT 04-28-2018   Personal history of radiation therapy 2016   Type 2 diabetes mellitus (HCC)    followed by pcp   Ureteral calculi    left   Urgency of urination    Wears glasses    Past Surgical History:  Procedure Laterality Date   ABDOMINAL HYSTERECTOMY  2005   AXILLARY SENTINEL NODE BIOPSY Left 07/31/2015   Procedure: SENTINEL LYMPH NODE BIOPSY, LEFT AXILLA, ;  Surgeon: Franky Macho, MD;  Location: [facility];   Service: General;  Laterality: Left;  Sentinel Node @ 1000   BREAST BIOPSY Left 06/09/2016   Procedure: LEFT BREAST BIOPSY AFTER NEEDLE LOCALIZATION;  Surgeon: Franky Macho, MD;  Location: [facility];  Service: General;  Laterality: Left;   BREAST LUMPECTOMY Right 2023   Secretory carcinoma, invasive and in situ   CARPAL TUNNEL RELEASE Left 11/02/2014   Procedure: CARPAL TUNNEL RELEASE;  Surgeon: Dairl Ponder, MD;  Location: [facility];  Service: Orthopedics;  Laterality: Left;   CATARACT EXTRACTION W/ INTRAOCULAR LENS  IMPLANT, BILATERAL  2007   COLONOSCOPY N/A 02/03/2017   Procedure: COLONOSCOPY;  Surgeon: West Bali, MD;  [facility]: [facility];  Service: Endoscopy;  Laterality: N/A;  1245   COLONOSCOPY N/A 03/01/2020   Procedure: COLONOSCOPY;  Surgeon: West Bali, MD;  [facility];  Service: Endoscopy;  Laterality: N/A;  10:30am   COLONOSCOPY W/ BIOPSIES AND POLYPECTOMY     CYSTOSCOPY WITH RETROGRADE PYELOGRAM, URETEROSCOPY AND STENT PLACEMENT Left 05/09/2018   Procedure: CYSTOSCOPY WITH RETROGRADE PYELOGRAM, URETEROSCOPY AND STENT PLACEMENT;  Surgeon: Malen Gauze, MD;  Location: [facility];  Service: Urology;  Laterality: Left;   CYSTOSCOPY/URETEROSCOPY/HOLMIUM LASER/STENT PLACEMENT Bilateral 03/18/2017   Procedure: CYSTOSCOPY/ BILATERAL RETROGRADE/RIGHT URETEROSCOPY/ RIGHT HOLMIUM LASER APPLICATION/RIGHT URETERAL STENT PLACEMENT;  Surgeon: Bjorn Pippin,  MD;  Location: [facility];  Service: Urology;  Laterality: Bilateral;   EXTRACORPOREAL SHOCK WAVE LITHOTRIPSY Right 12/14/2019   Procedure: EXTRACORPOREAL SHOCK WAVE LITHOTRIPSY (ESWL);  Surgeon: Jerilee Field, MD;  Location: [facility];  Service: Urology;  Laterality: Right;   EXTRACORPOREAL SHOCK WAVE LITHOTRIPSY Left 02/01/2020   Procedure: EXTRACORPOREAL SHOCK WAVE LITHOTRIPSY (ESWL);  Surgeon: Sebastian Ache, MD;  Location: [facility];  Service: Urology;   Laterality: Left;   HOLMIUM LASER APPLICATION Left 05/09/2018   Procedure: HOLMIUM LASER APPLICATION;  Surgeon: Malen Gauze, MD;  Location: [facility];  Service: Urology;  Laterality: Left;   KNEE ARTHROSCOPY WITH LATERAL MENISECTOMY Right 10/18/2020   Procedure: KNEE ARTHROSCOPY WITH LATERAL MENISECTOMY;  Surgeon: Vickki Hearing, MD;  Location: [facility];  Service: Orthopedics;  Laterality: Right;   OPEN REDUCTION INTERNAL FIXATION (ORIF) DISTAL RADIAL FRACTURE Left 11/02/2014   Procedure: OPEN REDUCTION INTERNAL FIXATION (ORIF) DISTAL RADIAL FRACTURE;  Surgeon: Dairl Ponder, MD;  [facility];  Service: Orthopedics;  Laterality: Left;   PARTIAL MASTECTOMY WITH NEEDLE LOCALIZATION Left 06/26/2015   Procedure: PARTIAL MASTECTOMY WITH NEEDLE LOCALIZATION;  Surgeon: Franky Macho, MD;  Location: [facility];  Service: General;  Laterality: Left;  Needle Loc @ 8:00am   POLYPECTOMY  02/03/2017   Procedure: POLYPECTOMY;  Surgeon: West Bali, MD;  [facility];  Service: Endoscopy;;  descending and hepatic flexure   POLYPECTOMY  03/01/2020   Procedure: POLYPECTOMY;  Surgeon: West Bali, MD;  [facility];  Service: Endoscopy;;  ascending;splenic flexure polyps x2;rectal   TARSAL METATARSAL ARTHRODESIS Left 09-07-2007   dr Lestine Box  [facility]   first and second tarsal metatarsal fusion, gastroc slide, neurolysis   TOTAL ABDOMINAL HYSTERECTOMY W/ BILATERAL SALPINGOOPHORECTOMY  10-21-2004   dr soper  [facility]   TOTAL KNEE ARTHROPLASTY Right 04/27/2023   Procedure: TOTAL KNEE ARTHROPLASTY;  Surgeon: Vickki Hearing, MD;  Location: [facility];  Service: Orthopedics;  Laterality: Right;   Patient Active Problem List   Diagnosis Date Noted   Primary osteoarthritis of right knee 04/27/2023   Osteoarthritis of right knee 04/27/2023   Malignant neoplasm of upper-inner quadrant of right breast in female, estrogen receptor positive (HCC) 08/13/2022   Urinary  incontinence, mixed 08/13/2022   Obesity, diabetes, and hypertension syndrome (HCC) 08/13/2022   Former tobacco use 08/13/2022   Vitamin B12 deficiency 01/29/2021   S/P right knee arthroscopy 10/18/20 10/18/2020   Acute lateral meniscus tear of right knee    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 08/23/2020   Colorectal polyps    Personal history of colonic polyps 02/14/2020   Stage 3a chronic kidney disease (HCC) 07/21/2017   Special screening for malignant neoplasms, colon    Kidney stone 01/13/2017   Class 1 obesity due to excess calories with serious comorbidity in adult 07/16/2016   Actinic keratosis 01/12/2016   Benign essential hypertension 01/12/2016   Gastroesophageal reflux disease without esophagitis 01/12/2016   Mixed hyperlipidemia 01/12/2016   Osteopenia 01/12/2016   Seborrheic keratoses 01/12/2016   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 (HCC) 10/16/2015   History of cancer of left breast 10/16/2015   Nonspecific abnormal finding in stool contents 01/11/2012   Type 2 diabetes mellitus with hyperlipidemia (HCC) 01/11/2012    PCP: Gwenlyn Found, MD  REFERRING PROVIDER: Vickki Hearing, MD   REFERRING DIAG: Primary osteoarthritis of right knee   THERAPY DIAG:  Stiffness  of right knee, not elsewhere classified  Acute pain of right knee  Rationale for Evaluation and Treatment: Rehabilitation  ONSET DATE: 04/27/23  SUBJECTIVE:   SUBJECTIVE STATEMENT: Pt reports 3/10 right knee pain today.    PERTINENT HISTORY: Hypertension, diabetes, osteopenia, osteoarthritis, chronic kidney disease, and history of breast cancer PAIN:  Are you having pain? Yes: NPRS scale: 3/10 Pain location: right anterior knee Pain description: intermittent sore with rare sharp pain  Aggravating factors: none known Relieving factors: ice and medication  PRECAUTIONS: None  WEIGHT BEARING RESTRICTIONS: No  FALLS:  Has  patient fallen in last 6 months? 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 PATIENT SURVEYS:  FOTO 43.00  COGNITION: Overall cognitive status: Within functional limits for tasks assessed     SENSATION: Patient reports no numbness or tingling  PALPATION: TTP: right quadriceps, IT band, hip adductors, hamstrings, tibia, and gastroc/soleus  LOWER EXTREMITY ROM:  Active ROM Right eval Left eval  Hip flexion    Hip extension    Hip abduction    Hip adduction    Hip internal rotation    Hip external rotation    Knee flexion 105/115 (PROM) 137  Knee extension 13 0  Ankle dorsiflexion    Ankle plantarflexion    Ankle inversion    Ankle eversion     (Blank rows = not tested)  LOWER EXTREMITY MMT: not tested due to surgical condition  GAIT: Assistive device utilized: Environmental consultant - 4 wheeled Level of assistance: Modified independence Comments: step to pattern without AD    TODAY'S TREATMENT:                                                                                                                              DATE:                                    06/03/23 EXERCISE LOG  Exercise Repetitions and Resistance Comments  Nustep L4 x 18 minutes; seat 7   Rockerboard 4 mins   Cybex knee extension 10# x 2 x 1.5 minute     Cybex knee flexion 30# x 3.5 minutes   Forward Step Ups 4" step x 20 reps bil   Seated Marches 2# x  20 reps bil    Blank cell = exercise not performed today  Modalities: no adverse reaction to today's modalities  Date:  Vaso: Knee, 34 degrees; low pressure, 15 mins, Pain and Edema                                   PATIENT EDUCATION:  Education details: healing, prognosis, and progress with therapy Person educated: Patient Education method: Explanation Education comprehension: verbalized understanding  HOME EXERCISE PROGRAM: Reviewed home health HEP  ASSESSMENT:  CLINICAL IMPRESSION: Pt arrives for today's treatment session reporting 3/10 right knee pain.  Pt reports having a bone density scan earlier in the week and her knee has been sore since then.  Pt able to increase FOTO score to 63 today.  Pt able to tolerate increased time on cybex exercises today with mild fatigue reported.  Pt introduced to standing rockerboard and forward step ups with min cues for proper technique, posture, and sequencing.  Pt able to demonstrate -5 degrees of right knee extension and 114 degrees of active right knee flexion.  Normal responses to vaso noted upon removal.  Pt has met all of her STGs and has made good progress towards all of her LTGs at this time.  Pt reported decreased pain at completion of today's treatment session.  OBJECTIVE IMPAIRMENTS: Abnormal gait, decreased activity tolerance, decreased mobility, difficulty walking, decreased ROM, decreased strength, hypomobility, increased edema, and pain.   ACTIVITY LIMITATIONS: carrying, lifting, standing, squatting, stairs, transfers, and locomotion level  PARTICIPATION LIMITATIONS: meal prep, cleaning, laundry, shopping, and community activity  PERSONAL FACTORS: 3+ comorbidities: Hypertension, diabetes, osteopenia, osteoarthritis, chronic kidney disease, and history of breast cancer  are also affecting patient's functional outcome.   REHAB  POTENTIAL: Good  CLINICAL DECISION MAKING: Evolving/moderate complexity  EVALUATION COMPLEXITY: Moderate   GOALS: Goals reviewed with patient? Yes  SHORT TERM GOALS: Target date: 06/07/23 Patient will be independent with her initial HEP. Baseline: Goal status: MET  2.  Patient will be able to demonstrate right knee extension within 10 degrees of neutral for improved knee mobility. Baseline:  Goal status: MET  3.  Patient will be able to safely ambulate at least 80 feet with a single-point cane for improved household mobility. Baseline:  Goal status: MET  4.  Patient will be able to demonstrate at least 112 degrees of active right knee flexion for improved knee mobility. Baseline:  Goal status: MET  LONG TERM GOALS: Target date: 06/28/23  Patient will be independent with her advanced HEP. Baseline:  Goal status: IN PROGRESS  2.  Patient will be able to demonstrate active right knee extension within 5 degrees of neutral for improved gait mechanics. Baseline: 7/18: -5 degrees Goal status: MET  3.  Patient will be able to demonstrate at least 120 degrees of right knee flexion for improved function navigating stairs. Baseline: 7/18: 114 degrees Goal status: IN PROGRESS  4.  Patient will be able to safely ambulate with no significant gait deviations for improved mobility. Baseline:  Goal status: IN PROGRESS  5.  Patient will be able to navigate at least 4 steps with a reciprocal gait pattern for improved household mobility. Baseline:  Goal status: IN PROGRESS  PLAN:  PT FREQUENCY: 2x/week  PT DURATION: 6 weeks  PLANNED INTERVENTIONS: Therapeutic exercises, Therapeutic activity, Neuromuscular re-education, Balance training, Gait training, Patient/Family education, Self Care, Joint mobilization, Stair training, Electrical stimulation, Cryotherapy, Moist heat, Vasopneumatic  device, Manual therapy, and Re-evaluation  PLAN FOR NEXT SESSION: NuStep, manual therapy (focus on  knee extension), lower extremity strengthening, gait training, and modalities as needed   Newman Pies, PTA 06/03/2023, 11:58 AM

## 2023-06-04 ENCOUNTER — Encounter (HOSPITAL_COMMUNITY): Payer: Medicare PPO

## 2023-06-07 ENCOUNTER — Encounter (HOSPITAL_COMMUNITY): Payer: Medicare PPO

## 2023-06-09 ENCOUNTER — Ambulatory Visit: Payer: Medicare PPO | Admitting: Physician Assistant

## 2023-06-09 ENCOUNTER — Encounter (HOSPITAL_COMMUNITY): Payer: Medicare PPO

## 2023-06-11 ENCOUNTER — Encounter (HOSPITAL_COMMUNITY): Payer: Medicare PPO

## 2023-06-14 ENCOUNTER — Encounter (HOSPITAL_COMMUNITY): Payer: Medicare PPO

## 2023-06-15 ENCOUNTER — Ambulatory Visit: Payer: Medicare PPO | Admitting: Physical Therapy

## 2023-06-15 ENCOUNTER — Encounter: Payer: Self-pay | Admitting: Physical Therapy

## 2023-06-15 DIAGNOSIS — M25661 Stiffness of right knee, not elsewhere classified: Secondary | ICD-10-CM | POA: Diagnosis not present

## 2023-06-15 DIAGNOSIS — M25561 Pain in right knee: Secondary | ICD-10-CM

## 2023-06-15 NOTE — Therapy (Addendum)
OUTPATIENT PHYSICAL THERAPY LOWER EXTREMITY TREATMENT   Patient Name: [name] MRN: [id number] DOB:1946/04/15, 78 y.o., female Today's Date: 06/15/2023  END OF SESSION:  PT End of Session - 06/15/23 0934     Visit Number 6    Number of Visits 12    Date for PT Re-Evaluation 08/13/23    PT Start Time 0933    PT Stop Time 1016    PT Time Calculation (min) 43 min    Activity Tolerance Patient tolerated treatment well    Behavior During Therapy [facility] for tasks assessed/performed            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   History of kidney stones    History of radiation therapy 09-10-2015 to [phone]   left breast 50Gy   Iron deficiency anemia    Malignant neoplasm of lower-inner quadrant of left breast in female, estrogen receptor positive [facility]) oncologist-  dr Minta Balsam higgs ([facility])   dx 08-16-2015--  Stage 1A invasisive (T1c,N0,M0), ER/PR+, HER2 negative---- 06-26-2015 left partial mastectomy , 07-31-2015 left axilla node dissection x1 (negative)-- completed radiation 10-08-2015 and started arimidex 10-16-2015   Mixed hyperlipidemia    Nephrolithiasis    bilateral per CT 04-28-2018   Personal history of radiation therapy 2016   Type 2 diabetes mellitus (HCC)    followed by pcp   Ureteral calculi    left   Urgency of urination    Wears glasses    Past Surgical History:  Procedure Laterality Date   ABDOMINAL HYSTERECTOMY  2005   AXILLARY SENTINEL NODE BIOPSY Left 07/31/2015   Procedure: SENTINEL LYMPH NODE BIOPSY, LEFT AXILLA, ;  Surgeon: Franky Macho, MD;  Location: [facility];  Service:  General;  Laterality: Left;  Sentinel Node @ 1000   BREAST BIOPSY Left 06/09/2016   Procedure: LEFT BREAST BIOPSY AFTER NEEDLE LOCALIZATION;  Surgeon: Franky Macho, MD;  Location: [facility];  Service: General;  Laterality: Left;   BREAST LUMPECTOMY Right 2023   Secretory carcinoma, invasive and in situ   CARPAL TUNNEL RELEASE Left 11/02/2014   Procedure: CARPAL TUNNEL RELEASE;  Surgeon: Dairl Ponder, MD;  Location: [facility];  Service: Orthopedics;  Laterality: Left;   CATARACT EXTRACTION W/ INTRAOCULAR LENS  IMPLANT, BILATERAL  2007   COLONOSCOPY N/A 02/03/2017   Procedure: COLONOSCOPY;  Surgeon: West Bali, MD;  [facility]: [facility];  Service: Endoscopy;  Laterality: N/A;  1245   COLONOSCOPY N/A 03/01/2020   Procedure: COLONOSCOPY;  Surgeon: West Bali, MD;  [facility];  Service: Endoscopy;  Laterality: N/A;  10:30am   COLONOSCOPY W/ BIOPSIES AND POLYPECTOMY     CYSTOSCOPY WITH RETROGRADE PYELOGRAM, URETEROSCOPY AND STENT PLACEMENT Left 05/09/2018   Procedure: CYSTOSCOPY WITH RETROGRADE PYELOGRAM, URETEROSCOPY AND STENT PLACEMENT;  Surgeon: Malen Gauze, MD;  [facility];  Service: Urology;  Laterality: Left;   CYSTOSCOPY/URETEROSCOPY/HOLMIUM LASER/STENT PLACEMENT Bilateral 03/18/2017   Procedure: CYSTOSCOPY/ BILATERAL RETROGRADE/RIGHT URETEROSCOPY/ RIGHT HOLMIUM LASER APPLICATION/RIGHT URETERAL STENT PLACEMENT;  Surgeon: Bjorn Pippin, MD;  Location:  [facility];  Service: Urology;  Laterality: Bilateral;   EXTRACORPOREAL SHOCK WAVE LITHOTRIPSY Right 12/14/2019   Procedure: EXTRACORPOREAL SHOCK WAVE LITHOTRIPSY (ESWL);  Surgeon: Jerilee Field, MD;  Location[facility];  Service: Urology;  Laterality: Right;   EXTRACORPOREAL SHOCK WAVE LITHOTRIPSY Left 02/01/2020   Procedure: EXTRACORPOREAL SHOCK WAVE LITHOTRIPSY (ESWL);  Surgeon: Sebastian Ache, MD;  Location: [facility];  Service: Urology;  Laterality: Left;    HOLMIUM LASER APPLICATION Left 05/09/2018   Procedure: HOLMIUM LASER APPLICATION;  Surgeon: Malen Gauze, MD;  Location: [facility];  Service: Urology;  Laterality: Left;   KNEE ARTHROSCOPY WITH LATERAL MENISECTOMY Right 10/18/2020   Procedure: KNEE ARTHROSCOPY WITH LATERAL MENISECTOMY;  Surgeon: Vickki Hearing, MD;  Location: [facility];  Service: Orthopedics;  Laterality: Right;   OPEN REDUCTION INTERNAL FIXATION (ORIF) DISTAL RADIAL FRACTURE Left 11/02/2014   Procedure: OPEN REDUCTION INTERNAL FIXATION (ORIF) DISTAL RADIAL FRACTURE;  Surgeon: Dairl Ponder, MD;  [facility];  Service: Orthopedics;  Laterality: Left;   PARTIAL MASTECTOMY WITH NEEDLE LOCALIZATION Left 06/26/2015   Procedure: PARTIAL MASTECTOMY WITH NEEDLE LOCALIZATION;  Surgeon: Franky Macho, MD;  Location: [facility];  Service: General;  Laterality: Left;  Needle Loc @ 8:00am   POLYPECTOMY  02/03/2017   Procedure: POLYPECTOMY;  Surgeon: West Bali, MD;  [facility];  Service: Endoscopy;;  descending and hepatic flexure   POLYPECTOMY  03/01/2020   Procedure: POLYPECTOMY;  Surgeon: West Bali, MD;  [facility];  Service: Endoscopy;;  ascending;splenic flexure polyps x2;rectal   TARSAL METATARSAL ARTHRODESIS Left 09-07-2007   dr Lestine Box  [facility]   first and second tarsal metatarsal fusion, gastroc slide, neurolysis   TOTAL ABDOMINAL HYSTERECTOMY W/ BILATERAL SALPINGOOPHORECTOMY  10-21-2004   dr soper  [facility]   TOTAL KNEE ARTHROPLASTY Right 04/27/2023   Procedure: TOTAL KNEE ARTHROPLASTY;  Surgeon: Vickki Hearing, MD;  Location: [facility];  Service: Orthopedics;  Laterality: Right;   Patient Active Problem List   Diagnosis Date Noted   Primary osteoarthritis of right knee 04/27/2023   Osteoarthritis of right knee 04/27/2023   Malignant neoplasm of upper-inner quadrant of right breast in female, estrogen receptor positive (HCC) 08/13/2022   Urinary incontinence, mixed  08/13/2022   Obesity, diabetes, and hypertension syndrome (HCC) 08/13/2022   Former tobacco use 08/13/2022   Vitamin B12 deficiency 01/29/2021   S/P right knee arthroscopy 10/18/20 10/18/2020   Acute lateral meniscus tear of right knee    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 08/23/2020   Colorectal polyps    Personal history of colonic polyps 02/14/2020   Stage 3a chronic kidney disease (HCC) 07/21/2017   Special screening for malignant neoplasms, colon    Kidney stone 01/13/2017   Class 1 obesity due to excess calories with serious comorbidity in adult 07/16/2016   Actinic keratosis 01/12/2016   Benign essential hypertension 01/12/2016   Gastroesophageal reflux disease without esophagitis 01/12/2016   Mixed hyperlipidemia 01/12/2016   Osteopenia 01/12/2016   Seborrheic keratoses 01/12/2016   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 (HCC) 10/16/2015   History of cancer of left breast 10/16/2015   Nonspecific abnormal finding in stool contents 01/11/2012   Type 2 diabetes mellitus with hyperlipidemia (HCC) 01/11/2012    PCP: Gwenlyn Found, MD  REFERRING PROVIDER: Vickki Hearing, MD   REFERRING DIAG: Primary osteoarthritis of right knee   THERAPY DIAG:  Stiffness of right knee,  not elsewhere classified  Acute pain of right knee  Rationale for Evaluation and Treatment: Rehabilitation  ONSET DATE: 04/27/23  SUBJECTIVE:   SUBJECTIVE STATEMENT: Reports her knee is sore as she had a busy weekend.  PERTINENT HISTORY: Hypertension, diabetes, osteopenia, osteoarthritis, chronic kidney disease, and history of breast cancer  PAIN:  Are you having pain? 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 PATIENT SURVEYS:  FOTO 43.00  COGNITION: Overall cognitive status: Within functional limits for tasks assessed     SENSATION: Patient reports no numbness or tingling  PALPATION: TTP: right quadriceps, IT band, hip adductors, hamstrings, tibia, and gastroc/soleus  LOWER EXTREMITY ROM:  Active ROM Right eval Left eval Right 06/15/23  Hip flexion     Hip extension     Hip abduction     Hip adduction     Hip internal rotation     Hip external rotation     Knee flexion 105/115 (PROM) 137 122  Knee extension 13 0 8  Ankle dorsiflexion     Ankle plantarflexion     Ankle inversion     Ankle eversion      (Blank rows = not tested)  LOWER EXTREMITY MMT: not tested due to surgical condition  GAIT: WNL; trying not to use AD majority of the time  TODAY'S TREATMENT:                                                                                                                              DATE:   06/15/23 EXERCISE LOG  Exercise Repetitions and Resistance Comments  Nustep L4 x 16 minutes; seat 8   Rockerboard 2 mins   Forward step ups 6" step x20 reps   Step down 4" step x15 reps   Heel raises X15 reps        Blank cell = exercise not performed today   Modalities: no adverse reaction to today's modalities  Date: 06/15/23 Vaso: Knee, 34 degrees; low pressure, 10 mins, Pain and Edema                                   PATIENT EDUCATION:  Education details: healing, prognosis, and progress with therapy Person educated: Patient Education method: Explanation Education comprehension: verbalized understanding  HOME EXERCISE  PROGRAM: Reviewed home health HEP  ASSESSMENT:  CLINICAL IMPRESSION: Patient presented in clinic  with reports of mild R knee soreness. Patient brought [facility] but left it in the waiting room of clinic as she is trying to wean from it. Gait is essentially WNL without AD. 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 Patient able to tolerate therex well but fatigued with standing exercises. Patient comfortable with her exterior stairs at home as they have handrails on both sides. She hasn't been into her basement at this time which is a full flight of stairs. Normal vasopneumatic response noted following removal of the modality.  OBJECTIVE IMPAIRMENTS: Abnormal gait, decreased activity tolerance, decreased mobility, difficulty walking, decreased ROM, decreased strength, hypomobility, increased edema, and pain.   ACTIVITY LIMITATIONS: carrying, lifting, standing, squatting, stairs, transfers, and locomotion level  PARTICIPATION LIMITATIONS: meal prep, cleaning, laundry, shopping, and community activity  PERSONAL FACTORS: 3+ comorbidities: Hypertension, diabetes, osteopenia, osteoarthritis, chronic kidney disease, and history of breast cancer  are also affecting patient's functional outcome.   REHAB POTENTIAL: Good  CLINICAL DECISION MAKING: Evolving/moderate complexity  EVALUATION COMPLEXITY: Moderate  GOALS: Goals reviewed with patient? Yes  SHORT TERM GOALS: Target date: 06/07/23 Patient will be independent with her initial HEP. Baseline: Goal status: MET  2.  Patient will be able to demonstrate right knee extension within 10 degrees of neutral for improved knee mobility. Baseline:  Goal status: MET  3.  Patient will be able to safely ambulate at least 80 feet with a single-point cane for improved household mobility. Baseline:  Goal status: MET  4.  Patient will be able to demonstrate at least 112 degrees of active right knee flexion for  improved knee mobility. Baseline:  Goal status: MET  LONG TERM GOALS: Target date: 06/28/23  Patient will be independent with her advanced HEP. Baseline:  Goal status: IN PROGRESS  2.  Patient will be able to demonstrate active right knee extension within 5 degrees of neutral for improved gait mechanics. Baseline: 7/18: -5 degrees Goal status: MET  3.  Patient will be able to demonstrate at least 120 degrees of right knee flexion for improved function navigating stairs. Baseline: 7/18: 114 degrees Goal status: MET  4.  Patient will be able to safely ambulate with no significant gait deviations for improved mobility. Baseline:  Goal status: MET  5.  Patient will be able to navigate at least 4 steps with a reciprocal gait pattern for improved household mobility. Baseline:  Goal status: IN PROGRESS  PLAN:  PT FREQUENCY: 2x/week  PT DURATION: 6 weeks  PLANNED INTERVENTIONS: Therapeutic exercises, Therapeutic activity, Neuromuscular re-education, Balance training, Gait training, Patient/Family education, Self Care, Joint mobilization, Stair training, Electrical stimulation, Cryotherapy, Moist heat, Vasopneumatic device, Manual therapy, and Re-evaluation  PLAN FOR NEXT SESSION: NuStep, manual therapy (focus on knee extension), lower extremity strengthening, gait training, and modalities as needed  Marvell Fuller, PTA 06/15/2023, 10:20 AM

## 2023-06-16 ENCOUNTER — Encounter (HOSPITAL_COMMUNITY): Payer: Medicare PPO | Admitting: Physical Therapy

## 2023-06-17 ENCOUNTER — Ambulatory Visit (INDEPENDENT_AMBULATORY_CARE_PROVIDER_SITE_OTHER): Payer: Medicare PPO | Admitting: Orthopedic Surgery

## 2023-06-17 ENCOUNTER — Encounter: Payer: Self-pay | Admitting: Orthopedic Surgery

## 2023-06-17 VITALS — BP 137/73 | HR 100 | Ht 65.0 in | Wt 170.0 lb

## 2023-06-17 DIAGNOSIS — Z96651 Presence of right artificial knee joint: Secondary | ICD-10-CM

## 2023-06-17 DIAGNOSIS — Z471 Aftercare following joint replacement surgery: Secondary | ICD-10-CM

## 2023-06-17 DIAGNOSIS — M1711 Unilateral primary osteoarthritis, right knee: Secondary | ICD-10-CM

## 2023-06-17 NOTE — Progress Notes (Signed)
Chief Complaint  Patient presents with   Post-op Follow-up    June 11th 24 total knee right/ improving using cane today but doesn't need it in the house    Encounter Diagnoses  Name Primary?   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  Range of motion is 0 to 125 degrees she is walking with a cane that she uses only out of the house  She would like to discontinue physical therapy which I think is fine as long as she does home exercises  Her incision healed completely no signs of infection, no signs of DVT, no joint effusion.  I will see her in 5 weeks within the global period for routine checkup

## 2023-07-22 ENCOUNTER — Encounter: Payer: Self-pay | Admitting: Orthopedic Surgery

## 2023-07-22 ENCOUNTER — Ambulatory Visit (INDEPENDENT_AMBULATORY_CARE_PROVIDER_SITE_OTHER): Payer: Medicare HMO | Admitting: Orthopedic Surgery

## 2023-07-22 VITALS — BP 138/79 | HR 92 | Ht 65.0 in | Wt 170.0 lb

## 2023-07-22 DIAGNOSIS — Z96651 Presence of right artificial knee joint: Secondary | ICD-10-CM

## 2023-07-22 NOTE — Progress Notes (Signed)
   VISIT TYPE: POST OP VISIT   Chief Complaint  Patient presents with   Post-op Follow-up    Right 04/27/23 improving     Encounter Diagnosis  Name Primary?   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, 2023 Yes     Current Outpatient Medications:    acetaminophen (TYLENOL) 500 MG tablet, Take 500 mg by mouth every 6 (six) hours as needed (pain.)., Disp: , Rfl:    amLODipine-olmesartan (AZOR) 5-40 MG tablet, Take 1 tablet by mouth in the morning., Disp: , Rfl:    anastrozole (ARIMIDEX) 1 MG tablet, TAKE 1 TABLET EVERY DAY, Disp: 90 tablet, Rfl: 3   aspirin 81 MG chewable tablet, Chew 1 tablet (81 mg total) by mouth 2 (two) times daily., Disp: 90 tablet, Rfl: 0   Blood Glucose Calibration (ACCU-CHEK AVIVA) SOLN, , Disp: , Rfl:    Blood Glucose Monitoring Suppl (ACCU-CHEK AVIVA PLUS) w/Device KIT, , Disp: , Rfl:    calcium carbonate (TUMS EX) 750 MG chewable tablet, Chew 2 tablets by mouth in the morning., Disp: , Rfl:    Cholecalciferol (VITAMIN D3) 1000 units CAPS, Take 1,000 Units by mouth every evening., Disp: , Rfl:    cyanocobalamin (VITAMIN B12) 1000 MCG tablet, Take 1,000 mcg by mouth every evening., Disp: , Rfl:    docusate sodium (COLACE) 100 MG capsule, Take 1 capsule (100 mg total) by mouth 2 (two) times daily., Disp: 10 capsule, Rfl: 0   fenofibrate 54 MG tablet, Take 54 mg by mouth every evening., Disp: , Rfl:    GEMTESA 75 MG TABS, Take 75 mg by mouth in the morning., Disp: , Rfl:    glucose blood test strip, 1 each 2 (two) times daily. Check Blood sugar 1-2 times daily, Disp: , Rfl:    metFORMIN (GLUCOPHAGE) 1000 MG tablet, Take 1,000 mg by mouth 2 (two) times daily with a meal. , Disp: , Rfl:    Multiple Vitamin (MULTIVITAMIN WITH MINERALS) TABS tablet, Take 1 tablet by mouth every morning. Women's One A Day 50+, Disp: , Rfl:    Omega-3 Fatty Acids (FISH OIL) 1000 MG CAPS, Take 1,000 mg by mouth every evening., Disp: , Rfl:    omeprazole (PRILOSEC) 10 MG capsule,  Take 10 mg by mouth daily before breakfast., Disp: , Rfl:    polyethylene glycol (MIRALAX / GLYCOLAX) 17 g packet, Take 17 g by mouth daily., Disp: 14 each, Rfl: 0   simvastatin (ZOCOR) 40 MG tablet, Take 40 mg by mouth at bedtime. , Disp: , Rfl:   Current Facility-Administered Medications:    bupivacaine-meloxicam ER (ZYNRELEF) injection 400 mg, 400 mg, Infiltration, Once, Vickki Hearing, MD   Subjective  Ayzlee is doing very well has no significant pain is walking without assistive device and her range of motion is 0-1 20   ASSESSMENT AND PLAN   Follow-up for 1 year x-rays on June 2025

## 2023-09-07 ENCOUNTER — Inpatient Hospital Stay: Payer: Medicare HMO | Attending: Hematology

## 2023-09-07 DIAGNOSIS — C50312 Malignant neoplasm of lower-inner quadrant of left female breast: Secondary | ICD-10-CM | POA: Insufficient documentation

## 2023-09-07 DIAGNOSIS — Z79811 Long term (current) use of aromatase inhibitors: Secondary | ICD-10-CM | POA: Insufficient documentation

## 2023-09-07 DIAGNOSIS — Z90710 Acquired absence of both cervix and uterus: Secondary | ICD-10-CM | POA: Diagnosis not present

## 2023-09-07 DIAGNOSIS — M8580 Other specified disorders of bone density and structure, unspecified site: Secondary | ICD-10-CM | POA: Diagnosis not present

## 2023-09-07 DIAGNOSIS — Z170 Estrogen receptor positive status [ER+]: Secondary | ICD-10-CM | POA: Insufficient documentation

## 2023-09-07 DIAGNOSIS — Z923 Personal history of irradiation: Secondary | ICD-10-CM | POA: Insufficient documentation

## 2023-09-07 DIAGNOSIS — Z87891 Personal history of nicotine dependence: Secondary | ICD-10-CM | POA: Insufficient documentation

## 2023-09-07 DIAGNOSIS — Z90722 Acquired absence of ovaries, bilateral: Secondary | ICD-10-CM | POA: Diagnosis not present

## 2023-09-07 DIAGNOSIS — Z800 Family history of malignant neoplasm of digestive organs: Secondary | ICD-10-CM | POA: Insufficient documentation

## 2023-09-07 DIAGNOSIS — E538 Deficiency of other specified B group vitamins: Secondary | ICD-10-CM

## 2023-09-07 LAB — CBC WITH DIFFERENTIAL/PLATELET
Abs Immature Granulocytes: 0.02 10*3/uL (ref 0.00–0.07)
Basophils Absolute: 0 10*3/uL (ref 0.0–0.1)
Basophils Relative: 0 %
Eosinophils Absolute: 0.1 10*3/uL (ref 0.0–0.5)
Eosinophils Relative: 1 %
HCT: 36.5 % (ref 36.0–46.0)
Hemoglobin: 11 g/dL — ABNORMAL LOW (ref 12.0–15.0)
Immature Granulocytes: 0 %
Lymphocytes Relative: 21 %
Lymphs Abs: 1.4 10*3/uL (ref 0.7–4.0)
MCH: 25.2 pg — ABNORMAL LOW (ref 26.0–34.0)
MCHC: 30.1 g/dL (ref 30.0–36.0)
MCV: 83.5 fL (ref 80.0–100.0)
Monocytes Absolute: 0.7 10*3/uL (ref 0.1–1.0)
Monocytes Relative: 10 %
Neutro Abs: 4.4 10*3/uL (ref 1.7–7.7)
Neutrophils Relative %: 68 %
Platelets: 246 10*3/uL (ref 150–400)
RBC: 4.37 MIL/uL (ref 3.87–5.11)
RDW: 16.9 % — ABNORMAL HIGH (ref 11.5–15.5)
WBC: 6.5 10*3/uL (ref 4.0–10.5)
nRBC: 0 % (ref 0.0–0.2)

## 2023-09-07 LAB — COMPREHENSIVE METABOLIC PANEL
ALT: 16 U/L (ref 0–44)
AST: 21 U/L (ref 15–41)
Albumin: 4.1 g/dL (ref 3.5–5.0)
Alkaline Phosphatase: 54 U/L (ref 38–126)
Anion gap: 10 (ref 5–15)
BUN: 31 mg/dL — ABNORMAL HIGH (ref 8–23)
CO2: 22 mmol/L (ref 22–32)
Calcium: 10.1 mg/dL (ref 8.9–10.3)
Chloride: 109 mmol/L (ref 98–111)
Creatinine, Ser: 1.2 mg/dL — ABNORMAL HIGH (ref 0.44–1.00)
GFR, Estimated: 46 mL/min — ABNORMAL LOW (ref >60)
Glucose, Bld: 105 mg/dL — ABNORMAL HIGH (ref 70–99)
Potassium: 3.9 mmol/L (ref 3.5–5.1)
Sodium: 141 mmol/L (ref 135–145)
Total Bilirubin: 0.5 mg/dL (ref 0.3–1.2)
Total Protein: 7.1 g/dL (ref 6.5–8.1)

## 2023-09-07 LAB — VITAMIN D 25 HYDROXY (VIT D DEFICIENCY, FRACTURES): Vit D, 25-Hydroxy: 44.56 ng/mL (ref 30–100)

## 2023-09-07 LAB — VITAMIN B12: Vitamin B-12: 741 pg/mL (ref 180–914)

## 2023-09-08 ENCOUNTER — Encounter: Payer: Self-pay | Admitting: Gastroenterology

## 2023-09-08 ENCOUNTER — Ambulatory Visit: Payer: Medicare HMO | Admitting: Gastroenterology

## 2023-09-08 ENCOUNTER — Telehealth: Payer: Self-pay | Admitting: Gastroenterology

## 2023-09-08 VITALS — BP 126/72 | HR 97 | Temp 98.0°F | Ht 65.0 in | Wt 164.0 lb

## 2023-09-08 DIAGNOSIS — Z860101 Personal history of adenomatous and serrated colon polyps: Secondary | ICD-10-CM

## 2023-09-08 DIAGNOSIS — D509 Iron deficiency anemia, unspecified: Secondary | ICD-10-CM | POA: Diagnosis not present

## 2023-09-08 MED ORDER — PEG 3350-KCL-NA BICARB-NACL 420 G PO SOLR: 4000.0000 mL | Freq: Once | ORAL | 0 refills | Status: AC

## 2023-09-08 NOTE — Telephone Encounter (Signed)
LMTCB

## 2023-09-08 NOTE — Patient Instructions (Signed)
As discussed today, based on age, colonoscopy is not routinely done for follow-up of polyps or colon cancer screening however given recent anemia it may be reasonable to consider colonoscopy.  I will discuss further with Dr. Marletta Lor and let you know what he advises.

## 2023-09-08 NOTE — Addendum Note (Signed)
Addended by: Armstead Peaks on: 09/08/2023 12:21 PM   Modules accepted: Orders

## 2023-09-08 NOTE — Telephone Encounter (Signed)
Please let pt know that Dr. Marletta Lor recommends colonoscopy due to iron def anemia to rule out occult lesion in the colon that could be contributing to her anemia.  Please have her hold iron for 7 days. Day of prep, am dose of metformin only. AM of TCS, hold metformin She is ASA 2, but she had labs including BMET yesterday (in [facility]).

## 2023-09-08 NOTE — Progress Notes (Signed)
GI Office Note    Referring Provider: Gwenlyn Found, MD Primary Care Physician:  Gwenlyn Found, MD  Primary Gastroenterologist:  Chief Complaint   Chief Complaint  Patient presents with   Colonoscopy     History of Present Illness   [name] is a 77 y.o. female presenting today with request of Dr. Rene Kocher for screening colonoscopy.  Due to age patient was brought in to discuss.  Patient has a history of iron deficiency anemia.  See labs below.  1 year ago, no anemia.  Preop labs on April 20, 2023 with mild anemia with hemoglobin 11.4 although hematocrit was normal. She had total knee replacement June 11th 2024, with estimated minimal blood loss, at time of discharge her hemoglobin was 9.4, likely in part hemodilutional.  On October 10 her hemoglobin was 11.2, hematocrit 34.9, MCV 88.1.  Iron 36 low, iron sat 7% low, ferritin 15, TIBC 553 high.  She was advised on over-the-counter iron every other day.  Patient states she was told back in April by her PCP that she was due for 3-year follow-up colonoscopy.  Our records indicate that we have advised previously that she did not require future screening/surveillance colonoscopies given her age.  Patient denies any bowel concerns.  No melena or rectal bleeding.  No abdominal pain.  No unintentional weight loss.  Reflux well-controlled on omeprazole 10 mg daily.     Colonoscopy April 2021: -four 2-4 mm polyps in the rectum, splenic flexure, ascending colon removed -External and internal hemorrhoids -2 simple adenomas and 2 serrated polyps removed -No future screening/surveillance colonoscopies recommended due to age  Medications   Current Outpatient Medications  Medication Sig Dispense Refill   amLODipine-olmesartan (AZOR) 5-40 MG tablet Take 1 tablet by mouth in the morning.     anastrozole (ARIMIDEX) 1 MG tablet TAKE 1 TABLET EVERY DAY 90 tablet 3   Blood Glucose Calibration (ACCU-CHEK AVIVA) SOLN      Blood Glucose  Monitoring Suppl (ACCU-CHEK AVIVA PLUS) w/Device KIT      calcium carbonate (TUMS EX) 750 MG chewable tablet Chew 2 tablets by mouth in the morning.     Cholecalciferol (VITAMIN D3) 1000 units CAPS Take 1,000 Units by mouth every evening.     cyanocobalamin (VITAMIN B12) 1000 MCG tablet Take 1,000 mcg by mouth every evening.     fenofibrate 54 MG tablet Take 54 mg by mouth every evening.     GEMTESA 75 MG TABS Take 75 mg by mouth in the morning.     glucose blood test strip 1 each 2 (two) times daily. Check Blood sugar 1-2 times daily     metFORMIN (GLUCOPHAGE) 1000 MG tablet Take 1,000 mg by mouth 2 (two) times daily with a meal.      Multiple Vitamin (MULTIVITAMIN WITH MINERALS) TABS tablet Take 1 tablet by mouth every morning. Women's One A Day 50+     Omega-3 Fatty Acids (FISH OIL) 1000 MG CAPS Take 1,000 mg by mouth every evening.     omeprazole (PRILOSEC) 10 MG capsule Take 10 mg by mouth daily before breakfast.     simvastatin (ZOCOR) 40 MG tablet Take 40 mg by mouth at bedtime.      No current facility-administered medications for this visit.    Allergies   Allergies as of 09/08/2023   (No Known Allergies)    Past Medical History   Past Medical History:  Diagnosis Date   Abdominal hernia  per CT 04-28-2018  abdominal/ pelvic anterior wall hernia   Arthritis    CKD (chronic kidney disease), stage III (HCC)    Disorder of bone and cartilage, unspecified    Essential hypertension, benign    Full dentures    GERD (gastroesophageal reflux disease)    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   History of kidney stones    History of radiation therapy 09-10-2015 to [phone]   left breast 50Gy   Iron deficiency anemia    Malignant neoplasm of lower-inner quadrant of left breast in female, estrogen receptor positive [facility]) oncologist-  dr Minta Balsam higgs ([facility])   dx 08-16-2015--  Stage 1A invasisive (T1c,N0,M0), ER/PR+, HER2  negative---- 06-26-2015 left partial mastectomy , 07-31-2015 left axilla node dissection x1 (negative)-- completed radiation 10-08-2015 and started arimidex 10-16-2015   Mixed hyperlipidemia    Nephrolithiasis    bilateral per CT 04-28-2018   Personal history of radiation therapy 2016   Type 2 diabetes mellitus (HCC)    followed by pcp   Ureteral calculi    left   Urgency of urination    Wears glasses     Past Surgical History   Past Surgical History:  Procedure Laterality Date   ABDOMINAL HYSTERECTOMY  2005   AXILLARY SENTINEL NODE BIOPSY Left 07/31/2015   Procedure: SENTINEL LYMPH NODE BIOPSY, LEFT AXILLA, ;  Surgeon: Franky Macho, MD;  Location: [facility];  Service: General;  Laterality: Left;  Sentinel Node @ 1000   BREAST BIOPSY Left 06/09/2016   Procedure: LEFT BREAST BIOPSY AFTER NEEDLE LOCALIZATION;  Surgeon: Franky Macho, MD;  Location: [facility];  Service: General;  Laterality: Left;   BREAST LUMPECTOMY Right 2023   Secretory carcinoma, invasive and in situ   CARPAL TUNNEL RELEASE Left 11/02/2014   Procedure: CARPAL TUNNEL RELEASE;  Surgeon: Dairl Ponder, MD;  Location: [facility];  Service: Orthopedics;  Laterality: Left;   CATARACT EXTRACTION W/ INTRAOCULAR LENS  IMPLANT, BILATERAL  2007   COLONOSCOPY N/A 02/03/2017   Procedure: COLONOSCOPY;  Surgeon: West Bali, MD;  [facility]: [facility];  Service: Endoscopy;  Laterality: N/A;  1245   COLONOSCOPY N/A 03/01/2020   Procedure: COLONOSCOPY;  Surgeon: West Bali, MD;  [facility]: [facility];  Service: Endoscopy;  Laterality: N/A;  10:30am   COLONOSCOPY W/ BIOPSIES AND POLYPECTOMY     CYSTOSCOPY WITH RETROGRADE PYELOGRAM, URETEROSCOPY AND STENT PLACEMENT Left 05/09/2018   Procedure: CYSTOSCOPY WITH RETROGRADE PYELOGRAM, URETEROSCOPY AND STENT PLACEMENT;  Surgeon: Malen Gauze, MD;  Location[facility];  Service: Urology;  Laterality: Left;   CYSTOSCOPY/URETEROSCOPY/HOLMIUM LASER/STENT PLACEMENT  Bilateral 03/18/2017   Procedure: CYSTOSCOPY/ BILATERAL RETROGRADE/RIGHT URETEROSCOPY/ RIGHT HOLMIUM LASER APPLICATION/RIGHT URETERAL STENT PLACEMENT;  Surgeon: Bjorn Pippin, MD;  Location: [facility];  Service: Urology;  Laterality: Bilateral;   EXTRACORPOREAL SHOCK WAVE LITHOTRIPSY Right 12/14/2019   Procedure: EXTRACORPOREAL SHOCK WAVE LITHOTRIPSY (ESWL);  Surgeon: Jerilee Field, MD;  [facility];  Service: Urology;  Laterality: Right;   EXTRACORPOREAL SHOCK WAVE LITHOTRIPSY Left 02/01/2020   Procedure: EXTRACORPOREAL SHOCK WAVE LITHOTRIPSY (ESWL);  Surgeon: Sebastian Ache, MD;  Location[facility];  Service: Urology;  Laterality: Left;   HOLMIUM LASER APPLICATION Left 05/09/2018   Procedure: HOLMIUM LASER APPLICATION;  Surgeon: Malen Gauze, MD;  Location: [facility];  Service: Urology;  Laterality: Left;   KNEE ARTHROSCOPY WITH LATERAL MENISECTOMY Right 10/18/2020  Procedure: KNEE ARTHROSCOPY WITH LATERAL MENISECTOMY;  Surgeon: Vickki Hearing, MD;  Location: [facility];  Service: Orthopedics;  Laterality: Right;   OPEN REDUCTION INTERNAL FIXATION (ORIF) DISTAL RADIAL FRACTURE Left 11/02/2014   Procedure: OPEN REDUCTION INTERNAL FIXATION (ORIF) DISTAL RADIAL FRACTURE;  Surgeon: Dairl Ponder, MD;  [facility];  Service: Orthopedics;  Laterality: Left;   PARTIAL MASTECTOMY WITH NEEDLE LOCALIZATION Left 06/26/2015   Procedure: PARTIAL MASTECTOMY WITH NEEDLE LOCALIZATION;  Surgeon: Franky Macho, MD;  Location: [facility];  Service: General;  Laterality: Left;  Needle Loc @ 8:00am   POLYPECTOMY  02/03/2017   Procedure: POLYPECTOMY;  Surgeon: West Bali, MD;  [facility];  Service: Endoscopy;;  descending and hepatic flexure   POLYPECTOMY  03/01/2020   Procedure: POLYPECTOMY;  Surgeon: West Bali, MD;  [facility];  Service: Endoscopy;;  ascending;splenic flexure polyps x2;rectal   TARSAL METATARSAL  ARTHRODESIS Left 09-07-2007   dr Lestine Box  [facility]   first and second tarsal metatarsal fusion, gastroc slide, neurolysis   TOTAL ABDOMINAL HYSTERECTOMY W/ BILATERAL SALPINGOOPHORECTOMY  10-21-2004   dr soper  [facility]   TOTAL KNEE ARTHROPLASTY Right 04/27/2023   Procedure: TOTAL KNEE ARTHROPLASTY;  Surgeon: Vickki Hearing, MD;  Location: [facility];  Service: Orthopedics;  Laterality: Right;    Past Family History   Family History  Problem Relation Age of Onset   Hypertension Mother    Osteoporosis Mother    Stomach cancer Mother    Diabetes Sister        x3   Colon cancer Neg Hx     Past Social History   Social History   Socioeconomic History   Marital status: Married    Spouse name: hubert   Number of children: 2   Years of education: gef   Highest education level: Not on file  Occupational History   Occupation: Retired  Tobacco Use   Smoking status: Former    Current packs/day: 0.00    Average packs/day: 1 pack/day for 20.0 years (20.0 ttl pk-yrs)    Types: Cigarettes    Start date: 06/05/1966    Quit date: 06/05/1986    Years since quitting: 37.2   Smokeless tobacco: Never  Vaping Use   Vaping status: Never Used  Substance and Sexual Activity   Alcohol use: No   Drug use: No   Sexual activity: Not Currently    Birth control/protection: Surgical  Other Topics Concern   Not on file  Social History Narrative   Not on file   Social Determinants of Health   Financial Resource Strain: Not on file  Food Insecurity: Low Risk  (08/26/2023)   Received from [facility]   Hunger Vital Sign    Worried About Running Out of Food in the Last Year: Never true    Ran Out of Food in the Last Year: Never true  Transportation Needs: No Transportation Needs (08/26/2023)   Received from [facility]    In the past 12 months, has lack of reliable transportation kept you from medical appointments, meetings, work or from getting things needed for daily living? :  No  Physical Activity: Not on file  Stress: Not on file  Social Connections: Not on file  Intimate Partner Violence: Not At Risk (04/27/2023)   Humiliation, Afraid, Rape, and Kick questionnaire    Fear of Current or Ex-Partner: No    Emotionally Abused: No    Physically Abused: No  Sexually Abused: No    Review of Systems   General: Negative for anorexia, weight loss, fever, chills, fatigue, weakness. Eyes: Negative for vision changes.  ENT: Negative for hoarseness, difficulty swallowing , nasal congestion. CV: Negative for chest pain, angina, palpitations, dyspnea on exertion, peripheral edema.  Respiratory: Negative for dyspnea at rest, dyspnea on exertion, cough, sputum, wheezing.  GI: See history of present illness. GU:  Negative for dysuria, hematuria, urinary incontinence, urinary frequency, nocturnal urination.  MS: Negative for joint pain, low back pain.  Derm: Negative for rash or itching.  Neuro: Negative for weakness, abnormal sensation, seizure, frequent headaches, memory loss,  confusion.  Psych: Negative for anxiety, depression, suicidal ideation, hallucinations.  Endo: Negative for unusual weight change.  Heme: Negative for bruising or bleeding. Allergy: Negative for rash or hives.  Physical Exam   BP 126/72 (BP Location: Left Arm, Patient Position: Sitting, Cuff Size: Normal)   Pulse 97   Temp 98 F (36.7 C) (Oral)   Ht 5\' 5"  (1.651 m)   Wt 164 lb (74.4 kg)   SpO2 93%   BMI 27.29 kg/m    General: Well-nourished, well-developed in no acute distress.  Head: Normocephalic, atraumatic.   Eyes: Conjunctiva pink, no icterus. Mouth: Oropharyngeal mucosa moist and pink  Neck: Supple without thyromegaly, masses, or lymphadenopathy.  Lungs: Clear to auscultation bilaterally.  Heart: Regular rate and rhythm, no murmurs rubs or gallops.  Abdomen: Bowel sounds are normal, nontender, nondistended, no hepatosplenomegaly or masses,  no abdominal bruits or hernia, no  rebound or guarding.   Rectal: not performed Extremities: No lower extremity edema. No clubbing or deformities.  Neuro: Alert and oriented x 4 , grossly normal neurologically.  Skin: Warm and dry, no rash or jaundice.   Psych: Alert and cooperative, normal mood and affect.  Labs   Lab Results  Component Value Date   NA 141 09/07/2023   CL 109 09/07/2023   K 3.9 09/07/2023   CO2 22 09/07/2023   BUN 31 (H) 09/07/2023   CREATININE 1.20 (H) 09/07/2023   GFRNONAA 46 (L) 09/07/2023   CALCIUM 10.1 09/07/2023   ALBUMIN 4.1 09/07/2023   GLUCOSE 105 (H) 09/07/2023   Lab Results  Component Value Date   ALT 16 09/07/2023   AST 21 09/07/2023   ALKPHOS 54 09/07/2023   BILITOT 0.5 09/07/2023   Lab Results  Component Value Date   WBC 6.5 09/07/2023   HGB 11.0 (L) 09/07/2023   HCT 36.5 09/07/2023   MCV 83.5 09/07/2023   PLT 246 09/07/2023   Lab Results  Component Value Date   VITAMINB12 741 09/07/2023   October 2024: Iron 36, iron sat 7%, TIBC 553, transferrin 387, ferritin 15, hemoglobin 11.2 low, hematocrit 34.9 low, MCV 71.1 low.  Imaging Studies   No results found.  Assessment/Plan:   H/O adenomatous colon polyps -colonoscopy 2021 with 4 polyps ranging 2-4 mm in size, based on guidelines surveillance colonoscopy in 3 to 5 years however given her age greater than 35, she was advised that she did not need future surveillance colonoscopies. -now with iron deficiency anemia, potentially related to total knee replacement surgery in June plus chronic kidney disease, occult GI bleeding not excluded. -discussed further with Dr. Marletta Lor, patient may benefit from colonoscopy on the basis of IDA to rule out occult GI bleeding/lesion.     Leanna Battles. Melvyn Neth, MHS, PA-C [facility]

## 2023-09-08 NOTE — Telephone Encounter (Signed)
PT CALLED BACK. Agreeable to procedure. She wanted morning so scheduled for 11/26. Aware will send instructions to her. Rx for prep to be sent to [facility]. Aware to stop iron 7 days prior.

## 2023-09-14 ENCOUNTER — Other Ambulatory Visit (HOSPITAL_COMMUNITY): Payer: Self-pay | Admitting: Hematology

## 2023-09-14 ENCOUNTER — Inpatient Hospital Stay: Payer: Medicare HMO | Admitting: Hematology

## 2023-09-14 VITALS — BP 131/72 | HR 90 | Temp 98.0°F | Resp 16 | Wt 165.1 lb

## 2023-09-14 DIAGNOSIS — Z853 Personal history of malignant neoplasm of breast: Secondary | ICD-10-CM

## 2023-09-14 DIAGNOSIS — C50312 Malignant neoplasm of lower-inner quadrant of left female breast: Secondary | ICD-10-CM | POA: Diagnosis not present

## 2023-09-14 DIAGNOSIS — Z170 Estrogen receptor positive status [ER+]: Secondary | ICD-10-CM | POA: Diagnosis not present

## 2023-09-14 NOTE — Patient Instructions (Signed)
[facility]  Discharge Instructions  You were seen and examined today by Dr. Ellin Saba.  Dr. Ellin Saba discussed your most recent lab work which revealed that everything looks good.  Continue taking the Anastrozole along with the Calcium and Vitamin D as prescribed.  Follow-up as scheduled in 1 year.    Thank you for choosing [facility] to provide your oncology and hematology care.   To afford each patient quality time with our provider, please arrive at least 15 minutes before your scheduled appointment time. You may need to reschedule your appointment if you arrive late (10 or more minutes). Arriving late affects you and other patients whose appointments are after yours.  Also, if you miss three or more appointments without notifying the office, you may be dismissed from the clinic at the provider's discretion.    Again, thank you for choosing [facility].  Our hope is that these requests will decrease the amount of time that you wait before being seen by our physicians.   If you have a lab appointment with the Cancer Center - please note that after April 8th, all labs will be drawn in the cancer center.  You do not have to check in or register with the main entrance as you have in the past but will complete your check-in at the cancer center.            _____________________________________________________________  Should you have questions after your visit to [facility], please contact our office at [phone] and follow the prompts.  Our office hours are 8:00 a.m. to 4:30 p.m. Monday - Thursday and 8:00 a.m. to 2:30 p.m. Friday.  Please note that voicemails left after 4:00 p.m. may not be returned until the following business day.  We are closed weekends and all major holidays.  You do have access to a nurse 24-7, just call the main number to the clinic [phone] and do not press any options, hold  on the line and a nurse will answer the phone.    For prescription refill requests, have your pharmacy contact our office and allow 72 hours.    Masks are no longer required in the cancer centers. If you would like for your care team to wear a mask while they are taking care of you, please let them know. You may have one support person who is at least 78 years old accompany you for your appointments.

## 2023-09-14 NOTE — Progress Notes (Signed)
[facility] [address], [place] [address]    Clinic Day:  09/14/2023  Referring physician: Gwenlyn Found, MD  Patient Care Team: Gwenlyn Found, MD as PCP - General (Family Medicine)   ASSESSMENT & PLAN:   Assessment: 1.  Stage I (T1BN0) right breast-secretory carcinoma: - Right lumpectomy and SLNB on 08/10/2022 - Pathology: 8 mm secretory carcinoma, invasive and in situ.  Grade 2/3.  Less than 1 mm from inferior margin.  0/6 sentinel lymph nodes involved.  Case was sent to [facility] for expert review.  ER 30% positive, strong staining.  PR 0%.  Ki-67 5%.  HER2 negative (IHC 0).   2.  Stage I left breast IDC: - Lumpectomy on 06/26/2015, ER/PR positive and HER2 negative - Radiation therapy completed on 10/09/2015 - Anastrozole started in November 2016, taking for total of 10 years   3.  Social/family history: - She lives at home with her husband and her 2 sons.  She is independent of ADLs and IADLs. - She worked in textile's prior to retirement.  Quit smoking in late 1980s.  Smoked for 20 years. - Mother had stomach cancer.  Plan: 1.  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 - Continue anastrozole which she is tolerating well. - RTC 1 year for follow-up with repeat mammogram and labs.   2.  Stage I left breast IDC: - Continue anastrozole as she is tolerating it very well. - Mammogram on 06/01/2023: BI-RADS Category 2.   3.  Osteopenia: - Reviewed bone density test from 06/01/2023, T-score -0.8.  This is similar to DEXA scan from February 2022. - Vitamin D level is 44.  Continue calcium (2 times daily) and vitamin D 1000 units daily.  Orders Placed This Encounter  Procedures   MM 3D DIAGNOSTIC MAMMOGRAM BILATERAL BREAST    Standing Status:   Future    Standing Expiration Date:   09/13/2024    Order Specific Question:   Reason for Exam (SYMPTOM  OR DIAGNOSIS REQUIRED)    Answer:   personal hx of  breast cancer    Order Specific Question:   Preferred imaging location?    Answer:   [facility]    Order Specific Question:   Release to patient    Answer:   Immediate   CBC with Differential/Platelet    Standing Status:   Future    Standing Expiration Date:   09/13/2024    Order Specific Question:   Release to patient    Answer:   Immediate   Comprehensive metabolic panel    Standing Status:   Future    Standing Expiration Date:   09/13/2024    Order Specific Question:   Release to patient    Answer:   Immediate   VITAMIN D 25 Hydroxy (Vit-D Deficiency, Fractures)    Standing Status:   Future    Standing Expiration Date:   09/13/2024    Order Specific Question:   Release to patient    Answer:   Immediate      I,Helena R Teague,acting as a scribe for Doreatha Massed, MD.,have documented all relevant documentation on the behalf of Doreatha Massed, MD,as directed by  Doreatha Massed, MD while in the presence of Doreatha Massed, MD.   I, Doreatha Massed MD, have reviewed the above documentation for accuracy and completeness, and I agree with the above.   Doreatha Massed, MD   10/29/20245:27 PM  CHIEF  COMPLAINT:   Diagnosis: Malignant neoplasm of lower-inner quadrant of left breast in female    Cancer Staging  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 [facility]) Staging form: Breast, AJCC 7th Edition - Clinical stage from 11/19/2015: Stage IA (T1c, N0, M0) - Signed by Ellouise Newer, PA-C on 11/19/2015    Prior Therapy:  1. left breast lumpectomy on 06/26/2015 2.  Right breast lumpectomy and SLNB on 08/10/2022  Current Therapy:  Ananstrozole   HISTORY OF PRESENT ILLNESS:   Oncology History  Breast cancer (HCC) (Resolved)  08/16/2015 Initial Diagnosis   Breast cancer (HCC)   09/11/2015 -  Radiation Therapy   Dr. Basilio Cairo   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 (HCC)  04/18/2015 Mammogram    Calcifications in the left breast   04/24/2015 Mammogram   BI-RADS Category 4 with indeterminate left breast calcification, 1 x 4.2 x 1.2 cm developing group of heterogeneous calcifications   05/02/2015 Pathology Results   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 ER+ 100%, PR+ 80%   05/02/2015 Procedure   Left breast stereotactic core needle biopsy   06/26/2015 Oncotype testing   ONCOTYPE DX assay with recurrence score result of 5, Low risk, 10 year risk of distance recurrence tamoxifen alone 5%   06/26/2015 Definitive Surgery   Breast partial mastectomy after needle localization with Dr. Lovell Sheehan no sentinel node performed   06/26/2015 Pathology Results   INVASIVE DUCTAL CARCINOMA, 2 CM. - HIGH GRADE DUCTAL CARCINOMA IN SITU. - INVASIVE CARCINOMA FOCALLY LESS THAN 0.1 CM FROM LATERAL MARGIN. - DUCTAL CARCINOMA IN SITU FOCALLY LESS THAN 0.1 CM FROM THE LATERAL MARGIN.   06/26/2015 Pathology Results   ER+ 100%, PR + 60%, Ki-67 25%, HER2 NEGATIVE   07/31/2015 Procedure   Sentinel node procedure   07/31/2015 Pathology Results   ONE BENIGN LYMPH NODE (0/1).   08/26/2015 Imaging   Bone density- Normal   09/10/2015 - 10/08/2015 Radiation Therapy   50 Gy left breast by Dr. Basilio Cairo.   10/16/2015 -  Anti-estrogen oral therapy   Arimidex   05/08/2016 Procedure   Stereotactic-guided biopsies of the calcifications of left breast.   05/08/2016 Pathology Results   Breast, left, needle core biopsy, lateral and ant to lumpectomy site FIBROCYSTIC CHANGES VASCULAR CALCIFICATIONS NO EVIDENCE OF MALIGNANCY   05/08/2016 Pathology Results   2. Breast, left, needle core biopsy, lumpectomy site FAT NECROSIS WITH FIBROSIS AND MICROCALCIFICATIONS NO EVIDENCE OF MALIGNANCY   06/09/2016 Surgery   Left breast biopsy for microcalcifications. Path: benign.      INTERVAL HISTORY:   [name] is a 77 y.o. female presenting to clinic today for follow up of right breast secretory carcinoma. She was  last seen by me on 09/15/22.  Since her last visit, she had a bilateral diagnostic MM on 06/01/23 that found: new lumpectomy changes in the right breast and no mammographic evidence of malignancy in either breast. She had a DEXA scan on 06/01/23 that revealed a T-score of -0.8.   She underwent total right knee arthroplasty on 04/27/23 with Dr. Romeo Apple.   Today, she states that she is doing well overall. Her appetite level is at 70%. Her energy level is at 70%. She denies any issues with anastrozole. She takes 1000 mg OD of Vitamin D.   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   History of kidney stones    History of radiation therapy 09-10-2015 to [phone]   left breast 50Gy   Iron deficiency anemia    Malignant neoplasm of lower-inner quadrant of left breast in female, estrogen receptor positive [facility]) oncologist-  dr Minta Balsam higgs ([facility])   dx 08-16-2015--  Stage 1A invasisive (T1c,N0,M0), ER/PR+, HER2 negative---- 06-26-2015 left partial mastectomy , 07-31-2015 left axilla node dissection x1 (negative)-- completed radiation 10-08-2015 and started arimidex 10-16-2015   Mixed hyperlipidemia    Nephrolithiasis    bilateral per CT 04-28-2018   Personal history of radiation therapy 2016   Type 2 diabetes mellitus (HCC)    followed by pcp   Ureteral calculi    left   Urgency of urination    Wears glasses     Surgical History: Past Surgical History:  Procedure Laterality Date   ABDOMINAL HYSTERECTOMY  2005   AXILLARY SENTINEL NODE BIOPSY Left 07/31/2015   Procedure:  SENTINEL LYMPH NODE BIOPSY, LEFT AXILLA, ;  Surgeon: Franky Macho, MD;  Location: [facility];  Service: General;  Laterality: Left;  Sentinel Node @ 1000   BREAST BIOPSY Left 06/09/2016   Procedure: LEFT BREAST BIOPSY AFTER NEEDLE LOCALIZATION;  Surgeon: Franky Macho, MD;  Location: [facility];  Service: General;  Laterality: Left;   BREAST LUMPECTOMY Right 2023   Secretory carcinoma, invasive and in situ   CARPAL TUNNEL RELEASE Left 11/02/2014   Procedure: CARPAL TUNNEL RELEASE;  Surgeon: Dairl Ponder, MD;  Location: [facility];  Service: Orthopedics;  Laterality: Left;   CATARACT EXTRACTION W/ INTRAOCULAR LENS  IMPLANT, BILATERAL  2007   COLONOSCOPY N/A 02/03/2017   Procedure: COLONOSCOPY;  Surgeon: West Bali, MD;  Location: [facility];  Service: Endoscopy;  Laterality: N/A;  1245   COLONOSCOPY N/A 03/01/2020   Procedure: COLONOSCOPY;  Surgeon: West Bali, MD;  Location: [facility];  Service: Endoscopy;  Laterality: N/A;  10:30am   COLONOSCOPY W/ BIOPSIES AND POLYPECTOMY     CYSTOSCOPY WITH RETROGRADE PYELOGRAM, URETEROSCOPY AND STENT PLACEMENT Left 05/09/2018   Procedure: CYSTOSCOPY WITH RETROGRADE PYELOGRAM, URETEROSCOPY AND STENT PLACEMENT;  Surgeon: Malen Gauze, MD;  Location: [facility];  Service: Urology;  Laterality: Left;   CYSTOSCOPY/URETEROSCOPY/HOLMIUM LASER/STENT PLACEMENT Bilateral 03/18/2017   Procedure: CYSTOSCOPY/ BILATERAL RETROGRADE/RIGHT URETEROSCOPY/ RIGHT HOLMIUM LASER APPLICATION/RIGHT URETERAL STENT PLACEMENT;  Surgeon: Bjorn Pippin, MD;  Location: [facility];  Service: Urology;  Laterality: Bilateral;   EXTRACORPOREAL SHOCK WAVE LITHOTRIPSY Right 12/14/2019   Procedure: EXTRACORPOREAL SHOCK WAVE LITHOTRIPSY (ESWL);  Surgeon: Jerilee Field, MD;  Location: [facility];  Service: Urology;  Laterality: Right;   EXTRACORPOREAL SHOCK WAVE LITHOTRIPSY Left 02/01/2020   Procedure: EXTRACORPOREAL SHOCK WAVE LITHOTRIPSY (ESWL);  Surgeon:  Sebastian Ache, MD;  Location: [facility];  Service: Urology;  Laterality: Left;   HOLMIUM LASER APPLICATION Left 05/09/2018   Procedure: HOLMIUM LASER APPLICATION;  Surgeon: Malen Gauze, MD;  Location: [facility];  Service: Urology;  Laterality: Left;   KNEE ARTHROSCOPY WITH LATERAL MENISECTOMY Right 10/18/2020   Procedure: KNEE ARTHROSCOPY WITH LATERAL MENISECTOMY;  Surgeon: Vickki Hearing, MD;  Location: [facility];  Service: Orthopedics;  Laterality: Right;   OPEN REDUCTION INTERNAL FIXATION (ORIF) DISTAL RADIAL FRACTURE Left 11/02/2014   Procedure: OPEN REDUCTION INTERNAL FIXATION (ORIF) DISTAL RADIAL FRACTURE;  Surgeon: Dairl Ponder, MD;  Location: [facility];  Service: Orthopedics;  Laterality: Left;   PARTIAL MASTECTOMY WITH NEEDLE LOCALIZATION Left 06/26/2015   Procedure: PARTIAL MASTECTOMY WITH NEEDLE LOCALIZATION;  Surgeon: Franky Macho, MD;  Location: [facility];  Service: General;  Laterality: Left;  Needle Loc @ 8:00am   POLYPECTOMY  02/03/2017   Procedure: POLYPECTOMY;  Surgeon: West Bali, MD;  [facility];  Service: Endoscopy;;  descending and hepatic flexure   POLYPECTOMY  03/01/2020   Procedure: POLYPECTOMY;  Surgeon: West Bali, MD;  [facility];  Service: Endoscopy;;  ascending;splenic flexure polyps x2;rectal   TARSAL METATARSAL ARTHRODESIS Left 09-07-2007   dr Lestine Box  [facility]   first and second tarsal metatarsal fusion, gastroc slide, neurolysis   TOTAL ABDOMINAL HYSTERECTOMY W/ BILATERAL SALPINGOOPHORECTOMY  10-21-2004   dr soper  [facility]   TOTAL KNEE ARTHROPLASTY Right 04/27/2023   Procedure: TOTAL KNEE ARTHROPLASTY;  Surgeon: Vickki Hearing, MD;  Location: [facility];  Service: Orthopedics;  Laterality: Right;    Social History: Social History   Socioeconomic History   Marital status: Married    Spouse name: hubert   Number of children: 2   Years of education: gef   Highest education level: Not on  file  Occupational History   Occupation: Retired  Tobacco Use   Smoking status: Former    Current packs/day: 0.00    Average packs/day: 1 pack/day for 20.0 years (20.0 ttl pk-yrs)    Types: Cigarettes    Start date: 06/05/1966    Quit date: 06/05/1986    Years since quitting: 37.3   Smokeless tobacco: Never  Vaping Use   Vaping status: Never Used  Substance and Sexual Activity   Alcohol use: No   Drug use: No   Sexual activity: Not Currently    Birth control/protection: Surgical  Other Topics Concern   Not on file  Social History Narrative   Not on file   Social Determinants of Health   Financial Resource Strain: Not on file  Food Insecurity: Low Risk  (08/26/2023)   Received from [facility]   Hunger Vital Sign    Worried About Running Out of Food in the Last Year: Never true    Ran Out of Food in the Last Year: Never true  Transportation Needs: No Transportation Needs (08/26/2023)   Received from [facility]    In the past 12 months, has lack of reliable transportation kept you from medical appointments, meetings, work or from getting things needed for daily living? : No  Physical Activity: Not on file  Stress: Not on file  Social Connections: Not on file  Intimate Partner Violence: Not At Risk (04/27/2023)   Humiliation, Afraid, Rape, and Kick questionnaire    Fear of Current or Ex-Partner: No    Emotionally Abused: No    Physically Abused: No    Sexually Abused: No    Family History: Family History  Problem Relation Age of Onset   Hypertension Mother    Osteoporosis Mother    Stomach cancer Mother    Diabetes Sister        x3   Colon cancer Neg Hx     Current Medications:  Current Outpatient Medications:    amLODipine-olmesartan (AZOR) 5-40 MG tablet,  Take 1 tablet by mouth in the morning., Disp: , Rfl:    anastrozole (ARIMIDEX) 1 MG tablet, TAKE 1 TABLET EVERY DAY, Disp: 90 tablet, Rfl: 3   Blood Glucose Calibration (ACCU-CHEK  AVIVA) SOLN, , Disp: , Rfl:    Blood Glucose Monitoring Suppl (ACCU-CHEK AVIVA PLUS) w/Device KIT, , Disp: , Rfl:    calcium carbonate (TUMS EX) 750 MG chewable tablet, Chew 2 tablets by mouth in the morning., Disp: , Rfl:    Cholecalciferol (VITAMIN D3) 1000 units CAPS, Take 1,000 Units by mouth every evening., Disp: , Rfl:    cyanocobalamin (VITAMIN B12) 1000 MCG tablet, Take 1,000 mcg by mouth every evening., Disp: , Rfl:    fenofibrate 54 MG tablet, Take 54 mg by mouth every evening., Disp: , Rfl:    GEMTESA 75 MG TABS, Take 75 mg by mouth in the morning., Disp: , Rfl:    glucose blood test strip, 1 each 2 (two) times daily. Check Blood sugar 1-2 times daily, Disp: , Rfl:    IRON, FERROUS SULFATE, PO, Take 1 tablet by mouth every other day., Disp: , Rfl:    metFORMIN (GLUCOPHAGE) 1000 MG tablet, Take 1,000 mg by mouth 2 (two) times daily with a meal. , Disp: , Rfl:    Multiple Vitamin (MULTIVITAMIN WITH MINERALS) TABS tablet, Take 1 tablet by mouth every morning. Women's One A Day 50+, Disp: , Rfl:    Omega-3 Fatty Acids (FISH OIL) 1000 MG CAPS, Take 1,000 mg by mouth every evening., Disp: , Rfl:    omeprazole (PRILOSEC) 10 MG capsule, Take 10 mg by mouth daily before breakfast., Disp: , Rfl:    simvastatin (ZOCOR) 40 MG tablet, Take 40 mg by mouth at bedtime. , Disp: , Rfl:    Allergies: No Known Allergies  REVIEW OF SYSTEMS:   Review of Systems  Constitutional:  Negative for chills, fatigue and fever.  HENT:   Negative for lump/mass, mouth sores, nosebleeds, sore throat and trouble swallowing.   Eyes:  Negative for eye problems.  Respiratory:  Negative for cough and shortness of breath.   Cardiovascular:  Negative for chest pain, leg swelling and palpitations.  Gastrointestinal:  Negative for abdominal pain, constipation, diarrhea, nausea and vomiting.  Genitourinary:  Negative for bladder incontinence, difficulty urinating, dysuria, frequency, hematuria and nocturia.    Musculoskeletal:  Negative for arthralgias, back pain, flank pain, myalgias and neck pain.  Skin:  Negative for itching and rash.  Neurological:  Positive for dizziness and numbness (in left hand). Negative for headaches.  Hematological:  Does not bruise/bleed easily.  Psychiatric/Behavioral:  Positive for sleep disturbance. Negative for depression and suicidal ideas. The patient is not nervous/anxious.   All other systems reviewed and are negative.    VITALS:   Blood pressure 131/72, pulse 90, temperature 98 F (36.7 C), temperature source Oral, resp. rate 16, weight 165 lb 1.6 oz (74.9 kg), SpO2 96%.  Wt Readings from Last 3 Encounters:  09/14/23 165 lb 1.6 oz (74.9 kg)  09/08/23 164 lb (74.4 kg)  07/22/23 170 lb (77.1 kg)    Body mass index is 27.47 kg/m.  Performance status (ECOG): 1 - Symptomatic but completely ambulatory  PHYSICAL EXAM:   Physical Exam Vitals and nursing note reviewed. Exam conducted with a chaperone present.  Constitutional:      Appearance: Normal appearance.  Cardiovascular:     Rate and Rhythm: Normal rate and regular rhythm.     Pulses: Normal pulses.     Heart  sounds: Normal heart sounds.  Pulmonary:     Effort: Pulmonary effort is normal.     Breath sounds: Normal breath sounds.  Chest:     Comments: +lumpectomy scar in upper inner quadrant of right breast is stable +left breast lumpectomy scar in central lower quadrant is barely visible +no palpable lymphadenopathy Abdominal:     Palpations: Abdomen is soft. There is no hepatomegaly, splenomegaly or mass.     Tenderness: There is no abdominal tenderness.  Musculoskeletal:     Right lower leg: No edema.     Left lower leg: No edema.  Lymphadenopathy:     Cervical: No cervical adenopathy.     Right cervical: No superficial, deep or posterior cervical adenopathy.    Left cervical: No superficial, deep or posterior cervical adenopathy.     Upper Body:     Right upper body: No  supraclavicular or axillary adenopathy.     Left upper body: No supraclavicular or axillary adenopathy.  Neurological:     General: No focal deficit present.     Mental Status: She is alert and oriented to person, place, and time.  Psychiatric:        Mood and Affect: Mood normal.        Behavior: Behavior normal.   Breast Exam Chaperone: Anne Fu, LPN   LABS:      Latest Ref Rng & Units 09/07/2023    1:29 PM 04/28/2023    4:43 AM 04/20/2023   10:25 AM  CBC  WBC 4.0 - 10.5 K/uL 6.5  7.1  5.1   Hemoglobin 12.0 - 15.0 g/dL 60.4  9.4  54.0   Hematocrit 36.0 - 46.0 % 36.5  30.0  36.5   Platelets 150 - 400 K/uL 246  167  212       Latest Ref Rng & Units 09/07/2023    1:29 PM 04/28/2023    4:43 AM 04/20/2023   10:25 AM  CMP  Glucose 70 - 99 mg/dL 981  191  478   BUN 8 - 23 mg/dL 31  24  26    Creatinine 0.44 - 1.00 mg/dL 2.95  6.21  3.08   Sodium 135 - 145 mmol/L 141  135  135   Potassium 3.5 - 5.1 mmol/L 3.9  4.6  4.7   Chloride 98 - 111 mmol/L 109  104  106   CO2 22 - 32 mmol/L 22  22  21    Calcium 8.9 - 10.3 mg/dL 65.7  8.3  9.4   Total Protein 6.5 - 8.1 g/dL 7.1     Total Bilirubin 0.3 - 1.2 mg/dL 0.5     Alkaline Phos 38 - 126 U/L 54     AST 15 - 41 U/L 21     ALT 0 - 44 U/L 16        No results found for: "CEA1", "CEA" / No results found for: "CEA1", "CEA" No results found for: "PSA1" No results found for: "QIO962" No results found for: "CAN125"  No results found for: "TOTALPROTELP", "ALBUMINELP", "A1GS", "A2GS", "BETS", "BETA2SER", "GAMS", "MSPIKE", "SPEI" No results found for: "TIBC", "FERRITIN", "IRONPCTSAT" Lab Results  Component Value Date   LDH 164 12/17/2020   LDH 159 05/21/2020   LDH 165 11/30/2019     STUDIES:   No results found.

## 2023-09-30 ENCOUNTER — Other Ambulatory Visit: Payer: Self-pay | Admitting: *Deleted

## 2023-09-30 DIAGNOSIS — C50312 Malignant neoplasm of lower-inner quadrant of left female breast: Secondary | ICD-10-CM

## 2023-09-30 MED ORDER — ANASTROZOLE 1 MG PO TABS: 1.0000 mg | ORAL_TABLET | Freq: Every day | ORAL | 3 refills | Status: DC

## 2023-09-30 NOTE — Telephone Encounter (Signed)
Refill sent to [facility] for Anastrozole.  Patient is tolerating and is to continue therapy.

## 2023-10-12 ENCOUNTER — Encounter (HOSPITAL_COMMUNITY): Payer: Self-pay

## 2023-10-12 ENCOUNTER — Ambulatory Visit (HOSPITAL_COMMUNITY)
Admission: RE | Admit: 2023-10-12 | Discharge: 2023-10-12 | Disposition: A | Discharge status: Home / Self Care | Payer: Medicare HMO | Attending: Internal Medicine | Admitting: Internal Medicine

## 2023-10-12 ENCOUNTER — Encounter (HOSPITAL_COMMUNITY)
Admission: RE | Disposition: A | Discharge status: Home / Self Care | Payer: Self-pay | Source: Home / Self Care | Attending: Internal Medicine

## 2023-10-12 ENCOUNTER — Other Ambulatory Visit: Payer: Self-pay

## 2023-10-12 ENCOUNTER — Ambulatory Visit (HOSPITAL_COMMUNITY): Payer: Medicare HMO | Admitting: Anesthesiology

## 2023-10-12 DIAGNOSIS — D175 Benign lipomatous neoplasm of intra-abdominal organs: Secondary | ICD-10-CM

## 2023-10-12 DIAGNOSIS — K648 Other hemorrhoids: Secondary | ICD-10-CM | POA: Diagnosis not present

## 2023-10-12 DIAGNOSIS — Z860101 Personal history of adenomatous and serrated colon polyps: Secondary | ICD-10-CM | POA: Insufficient documentation

## 2023-10-12 DIAGNOSIS — N183 Chronic kidney disease, stage 3 unspecified: Secondary | ICD-10-CM | POA: Insufficient documentation

## 2023-10-12 DIAGNOSIS — K5730 Diverticulosis of large intestine without perforation or abscess without bleeding: Secondary | ICD-10-CM | POA: Diagnosis not present

## 2023-10-12 DIAGNOSIS — Z1211 Encounter for screening for malignant neoplasm of colon: Secondary | ICD-10-CM | POA: Insufficient documentation

## 2023-10-12 DIAGNOSIS — I129 Hypertensive chronic kidney disease with stage 1 through stage 4 chronic kidney disease, or unspecified chronic kidney disease: Secondary | ICD-10-CM | POA: Diagnosis not present

## 2023-10-12 DIAGNOSIS — Z87891 Personal history of nicotine dependence: Secondary | ICD-10-CM | POA: Diagnosis not present

## 2023-10-12 DIAGNOSIS — E1122 Type 2 diabetes mellitus with diabetic chronic kidney disease: Secondary | ICD-10-CM | POA: Insufficient documentation

## 2023-10-12 DIAGNOSIS — E1169 Type 2 diabetes mellitus with other specified complication: Secondary | ICD-10-CM

## 2023-10-12 DIAGNOSIS — Z7984 Long term (current) use of oral hypoglycemic drugs: Secondary | ICD-10-CM | POA: Insufficient documentation

## 2023-10-12 HISTORY — PX: COLONOSCOPY WITH PROPOFOL: SHX5780

## 2023-10-12 LAB — GLUCOSE, CAPILLARY: Glucose-Capillary: 119 mg/dL — ABNORMAL HIGH (ref 70–99)

## 2023-10-12 SURGERY — COLONOSCOPY WITH PROPOFOL
Anesthesia: General

## 2023-10-12 MED ORDER — GLYCOPYRROLATE PF 0.2 MG/ML IJ SOSY
PREFILLED_SYRINGE | INTRAMUSCULAR | Status: DC | PRN
  Administered 2023-10-12: .2 mg via INTRAVENOUS

## 2023-10-12 MED ORDER — PROPOFOL 500 MG/50ML IV EMUL
INTRAVENOUS | Status: DC | PRN
  Administered 2023-10-12: 50 ug/kg/min via INTRAVENOUS

## 2023-10-12 MED ORDER — LIDOCAINE 2% (20 MG/ML) 5 ML SYRINGE: INTRAMUSCULAR | Status: DC | PRN

## 2023-10-12 MED ORDER — PROPOFOL 10 MG/ML IV BOLUS
INTRAVENOUS | Status: DC | PRN
  Administered 2023-10-12: 40 mg via INTRAVENOUS

## 2023-10-12 MED ORDER — PROPOFOL 10 MG/ML IV BOLUS: INTRAVENOUS | Status: DC | PRN

## 2023-10-12 MED ORDER — PROPOFOL 500 MG/50ML IV EMUL
INTRAVENOUS | Status: AC
Start: 2023-10-12 — End: ?
  Filled 2023-10-12: qty 50

## 2023-10-12 MED ORDER — LACTATED RINGERS IV SOLN: INTRAVENOUS | Status: DC | PRN

## 2023-10-12 MED ORDER — LIDOCAINE HCL (PF) 2 % IJ SOLN
INTRAMUSCULAR | Status: DC | PRN
  Administered 2023-10-12: 100 mg via INTRADERMAL

## 2023-10-12 NOTE — Op Note (Signed)
[facility] Patient Name: [name] Procedure Date: 10/12/2023 11:06 AM MRN: [id number] Date of Birth: [birth year] Attending MD: Hennie Duos. Marletta Lor , [place], [phone] CSN: [id number] Age: 77 Admit Type: Outpatient Procedure:                Colonoscopy Indications:              Surveillance: Personal history of adenomatous                            polyps on last colonoscopy 3 years ago Providers:                Hennie Duos. Marletta Lor, DO, Crystal Page, Lennice Sites                            Technician, Technician Referring MD:              Medicines:                See the Anesthesia note for documentation of the                            administered medications Complications:            No immediate complications. Estimated Blood Loss:     Estimated blood loss: none. Procedure:                Pre-Anesthesia Assessment:                           - The anesthesia plan was to use monitored                            anesthesia care (MAC).                           After obtaining informed consent, the colonoscope                            was passed under direct vision. Throughout the                            procedure, the patient's blood pressure, pulse, and                            oxygen saturations were monitored continuously. The                            [id number]-HQ190L ([id number]) scope was introduced through                            the anus and advanced to the the cecum, identified                            by appendiceal orifice and ileocecal valve. The                            colonoscopy was performed without difficulty.  The                            patient tolerated the procedure well. The quality                            of the bowel preparation was evaluated using the                            BBPS [facility] Bowel Preparation Scale) with scores                            of: Right Colon = 3, Transverse Colon = 3 and Left                            Colon = 3  (entire mucosa seen well with no residual                            staining, small fragments of stool or opaque                            liquid). The total BBPS score equals 9. Scope In: 11:17:18 AM Scope Out: 11:28:53 AM Scope Withdrawal Time: 0 hours 9 minutes 13 seconds  Total Procedure Duration: 0 hours 11 minutes 35 seconds  Findings:      Non-bleeding internal hemorrhoids were found during endoscopy.      Many small-mouthed diverticula were found in the transverse colon.      There was a medium-sized lipoma, in the transverse colon.      The exam was otherwise without abnormality. Impression:               - Non-bleeding internal hemorrhoids.                           - Diverticulosis in the descending colon and in the                            transverse colon.                           - Medium-sized lipoma in the transverse colon.                           - The examination was otherwise normal.                           - No specimens collected. Moderate Sedation:      Per Anesthesia Care Recommendation:           - Patient has a contact number available for                            emergencies. The signs and symptoms of potential                            delayed complications were discussed  with the                            patient. Return to normal activities tomorrow.                            Written discharge instructions were provided to the                            patient.                           - Resume previous diet.                           - Continue present medications.                           - No repeat colonoscopy due to age.                           - Return to GI clinic in 3 months. Procedure Code(s):        --- Professional ---                           B1478, Colorectal cancer screening; colonoscopy on                            individual at high risk Diagnosis Code(s):        --- Professional ---                           Z86.010,  Personal history of colonic polyps                           K64.8, Other hemorrhoids                           D17.5, Benign lipomatous neoplasm of                            intra-abdominal organs                           K57.30, Diverticulosis of large intestine without                            perforation or abscess without bleeding CPT copyright 2022 American Medical Association. All rights reserved. The codes documented in this report are preliminary and upon coder review may  be revised to meet current compliance requirements. Hennie Duos. Marletta Lor, DO Hennie Duos. Marletta Lor, DO 10/12/2023 11:37:32 AM This report has been signed electronically. Number of Addenda: 0

## 2023-10-12 NOTE — H&P (Signed)
Primary Care Physician:  Gwenlyn Found, MD Primary Gastroenterologist:  Dr. Marletta Lor  Pre-Procedure History & Physical: HPI:  [name] is a 77 y.o. female is here for a colonoscopy to be performed for surveillance purposes, personal history of adenomatous colon polyps in 2021  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   History of kidney stones    History of radiation therapy 09-10-2015 to [phone]   left breast 50Gy   Iron deficiency anemia    Malignant neoplasm of lower-inner quadrant of left breast in female, estrogen receptor positive [facility]) oncologist-  dr Minta Balsam higgs ([facility])   dx 08-16-2015--  Stage 1A invasisive (T1c,N0,M0), ER/PR+, HER2 negative---- 06-26-2015 left partial mastectomy , 07-31-2015 left axilla node dissection x1 (negative)-- completed radiation 10-08-2015 and started arimidex 10-16-2015   Mixed hyperlipidemia    Nephrolithiasis    bilateral per CT 04-28-2018   Personal history of radiation therapy 2016   Type 2 diabetes mellitus (HCC)    followed by pcp   Ureteral calculi    left   Urgency of urination    Wears glasses     Past Surgical History:  Procedure Laterality Date   ABDOMINAL HYSTERECTOMY  2005   AXILLARY SENTINEL NODE BIOPSY Left 07/31/2015   Procedure: SENTINEL LYMPH NODE BIOPSY, LEFT AXILLA, ;  Surgeon: Franky Macho, MD;  Location: [facility];  Service: General;  Laterality: Left;  Sentinel Node @ 1000   BREAST BIOPSY Left 06/09/2016   Procedure: LEFT BREAST BIOPSY AFTER NEEDLE LOCALIZATION;  Surgeon: Franky Macho, MD;  Location: [facility];  Service: General;  Laterality: Left;    BREAST LUMPECTOMY Right 2023   Secretory carcinoma, invasive and in situ   CARPAL TUNNEL RELEASE Left 11/02/2014   Procedure: CARPAL TUNNEL RELEASE;  Surgeon: Dairl Ponder, MD;  Location: [facility];  Service: Orthopedics;  Laterality: Left;   CATARACT EXTRACTION W/ INTRAOCULAR LENS  IMPLANT, BILATERAL  2007   COLONOSCOPY N/A 02/03/2017   Procedure: COLONOSCOPY;  Surgeon: West Bali, MD;  Location: [facility];  Service: Endoscopy;  Laterality: N/A;  1245   COLONOSCOPY N/A 03/01/2020   Procedure: COLONOSCOPY;  Surgeon: West Bali, MD;  [facility]: [facility];  Service: Endoscopy;  Laterality: N/A;  10:30am   COLONOSCOPY W/ BIOPSIES AND POLYPECTOMY     CYSTOSCOPY WITH RETROGRADE PYELOGRAM, URETEROSCOPY AND STENT PLACEMENT Left 05/09/2018   Procedure: CYSTOSCOPY WITH RETROGRADE PYELOGRAM, URETEROSCOPY AND STENT PLACEMENT;  Surgeon: Malen Gauze, MD;  Location: [facility];  Service: Urology;  Laterality: Left;   CYSTOSCOPY/URETEROSCOPY/HOLMIUM LASER/STENT PLACEMENT Bilateral 03/18/2017   Procedure: CYSTOSCOPY/ BILATERAL RETROGRADE/RIGHT URETEROSCOPY/ RIGHT HOLMIUM LASER APPLICATION/RIGHT URETERAL STENT PLACEMENT;  Surgeon: Bjorn Pippin, MD;  Location: [facility];  Service: Urology;  Laterality: Bilateral;   EXTRACORPOREAL SHOCK WAVE LITHOTRIPSY Right 12/14/2019   Procedure: EXTRACORPOREAL SHOCK WAVE LITHOTRIPSY (ESWL);  Surgeon: Jerilee Field, MD;  Location: [facility];  Service: Urology;  Laterality: Right;   EXTRACORPOREAL SHOCK WAVE LITHOTRIPSY Left 02/01/2020   Procedure: EXTRACORPOREAL SHOCK WAVE LITHOTRIPSY (ESWL);  Surgeon: Berneice Heinrich,  Normand Sloop, MD;  Location: [facility];  Service: Urology;  Laterality: Left;   HOLMIUM LASER APPLICATION Left 05/09/2018   Procedure: HOLMIUM LASER APPLICATION;  Surgeon: Malen Gauze, MD;  Location: [facility];  Service: Urology;  Laterality: Left;   KNEE ARTHROSCOPY WITH  LATERAL MENISECTOMY Right 10/18/2020   Procedure: KNEE ARTHROSCOPY WITH LATERAL MENISECTOMY;  Surgeon: Vickki Hearing, MD;  Location: [facility];  Service: Orthopedics;  Laterality: Right;   OPEN REDUCTION INTERNAL FIXATION (ORIF) DISTAL RADIAL FRACTURE Left 11/02/2014   Procedure: OPEN REDUCTION INTERNAL FIXATION (ORIF) DISTAL RADIAL FRACTURE;  Surgeon: Dairl Ponder, MD;  Location[facility];  Service: Orthopedics;  Laterality: Left;   PARTIAL MASTECTOMY WITH NEEDLE LOCALIZATION Left 06/26/2015   Procedure: PARTIAL MASTECTOMY WITH NEEDLE LOCALIZATION;  Surgeon: Franky Macho, MD;  Location: [facility];  Service: General;  Laterality: Left;  Needle Loc @ 8:00am   POLYPECTOMY  02/03/2017   Procedure: POLYPECTOMY;  Surgeon: West Bali, MD;  [facility];  Service: Endoscopy;;  descending and hepatic flexure   POLYPECTOMY  03/01/2020   Procedure: POLYPECTOMY;  Surgeon: West Bali, MD;  [facility];  Service: Endoscopy;;  ascending;splenic flexure polyps x2;rectal   TARSAL METATARSAL ARTHRODESIS Left 09-07-2007   dr Lestine Box  [facility]   first and second tarsal metatarsal fusion, gastroc slide, neurolysis   TOTAL ABDOMINAL HYSTERECTOMY W/ BILATERAL SALPINGOOPHORECTOMY  10-21-2004   dr soper  [facility]   TOTAL KNEE ARTHROPLASTY Right 04/27/2023   Procedure: TOTAL KNEE ARTHROPLASTY;  Surgeon: Vickki Hearing, MD;  Location: [facility];  Service: Orthopedics;  Laterality: Right;    Prior to Admission medications   Medication Sig Start Date End Date Taking? Authorizing Provider  amLODipine-olmesartan (AZOR) 5-40 MG tablet Take 1 tablet by mouth in the morning.   Yes [provider]  anastrozole (ARIMIDEX) 1 MG tablet Take 1 tablet (1 mg total) by mouth daily. 09/30/23  Yes Doreatha Massed, MD  calcium carbonate (TUMS EX) 750 MG chewable tablet Chew 2 tablets by mouth in the morning.   Yes [provider]  Cholecalciferol (VITAMIN D3) 1000 units CAPS Take 1,000  Units by mouth every evening.   Yes [provider]  cyanocobalamin (VITAMIN B12) 1000 MCG tablet Take 1,000 mcg by mouth every evening.   Yes [provider]  fenofibrate 54 MG tablet Take 54 mg by mouth every evening. 05/26/21  Yes [provider]  GEMTESA 75 MG TABS Take 75 mg by mouth in the morning. 05/13/22  Yes [provider]  metFORMIN (GLUCOPHAGE) 1000 MG tablet Take 1,000 mg by mouth 2 (two) times daily with a meal.    Yes [provider]  Omega-3 Fatty Acids (FISH OIL) 1000 MG CAPS Take 1,000 mg by mouth every evening.   Yes [provider]  simvastatin (ZOCOR) 40 MG tablet Take 40 mg by mouth at bedtime.    Yes [provider]  Blood Glucose Calibration (ACCU-CHEK AVIVA) SOLN  11/21/19   [provider]  Blood Glucose Monitoring Suppl (ACCU-CHEK AVIVA PLUS) w/Device KIT  11/20/19   [provider]  glucose blood test strip 1 each 2 (two) times daily. Check Blood sugar 1-2 times daily 09/19/18   [provider]  IRON, FERROUS SULFATE, PO Take 1 tablet by mouth every other day.    [provider]  Multiple Vitamin (MULTIVITAMIN WITH MINERALS) TABS tablet Take 1 tablet by mouth every morning. Women's One A Day 50+  [provider]  omeprazole (PRILOSEC) 10 MG capsule Take 10 mg by mouth daily before breakfast.    [provider]    Allergies as of 09/08/2023   (No Known Allergies)    Family History  Problem Relation Age of Onset   Hypertension Mother    Osteoporosis Mother    Stomach cancer Mother    Diabetes Sister        x3   Colon cancer Neg Hx     Social History   Socioeconomic History   Marital status: Married    Spouse name: hubert   Number of children: 2   Years of education: gef   Highest education level: Not on file  Occupational History   Occupation: Retired  Tobacco Use   Smoking status: Former    Current packs/day: 0.00    Average packs/day: 1  pack/day for 20.0 years (20.0 ttl pk-yrs)    Types: Cigarettes    Start date: 06/05/1966    Quit date: 06/05/1986    Years since quitting: 37.3   Smokeless tobacco: Never  Vaping Use   Vaping status: Never Used  Substance and Sexual Activity   Alcohol use: No   Drug use: No   Sexual activity: Not Currently    Birth control/protection: Surgical  Other Topics Concern   Not on file  Social History Narrative   Not on file   Social Determinants of Health   Financial Resource Strain: Not on file  Food Insecurity: Low Risk  (08/26/2023)   Received from [facility]   Hunger Vital Sign    Worried About Running Out of Food in the Last Year: Never true    Ran Out of Food in the Last Year: Never true  Transportation Needs: No Transportation Needs (08/26/2023)   Received from [facility]    In the past 12 months, has lack of reliable transportation kept you from medical appointments, meetings, work or from getting things needed for daily living? : No  Physical Activity: Not on file  Stress: Not on file  Social Connections: Not on file  Intimate Partner Violence: Not At Risk (04/27/2023)   Humiliation, Afraid, Rape, and Kick questionnaire    Fear of Current or Ex-Partner: No    Emotionally Abused: No    Physically Abused: No    Sexually Abused: No    Review of Systems: See HPI, otherwise negative ROS  Physical Exam: Vital signs in last 24 hours: Temp:  [97.8 F (36.6 C)] 97.8 F (36.6 C) (11/26 1006) Pulse Rate:  [101] 101 (11/26 1006) Resp:  [17] 17 (11/26 1006) BP: (138)/(78) 138/78 (11/26 1006) SpO2:  [96 %] 96 % (11/26 1006) Weight:  [74.4 kg] 74.4 kg (11/26 1006)   General:   Alert,  Well-developed, well-nourished, pleasant and cooperative in NAD Head:  Normocephalic and atraumatic. Eyes:  Sclera clear, no icterus.   Conjunctiva pink. Ears:  Normal auditory acuity. Nose:  No deformity, discharge,  or lesions. Msk:  Symmetrical without gross  deformities. Normal posture. Extremities:  Without clubbing or edema. Neurologic:  Alert and  oriented x4;  grossly normal neurologically. Skin:  Intact without significant lesions or rashes. Psych:  Alert and cooperative. Normal mood and affect.  Impression/Plan: [name] is here for a colonoscopy to be performed for surveillance purposes, personal history of adenomatous colon polyps in 2021  The risks of the procedure including infection, bleed, or perforation as well as benefits, limitations, alternatives and imponderables have  been reviewed with the patient. Questions have been answered. All parties agreeable.

## 2023-10-12 NOTE — Discharge Instructions (Addendum)
  Colonoscopy Discharge Instructions  Read the instructions outlined below and refer to this sheet in the next few weeks. These discharge instructions provide you with general information on caring for yourself after you leave the hospital. Your doctor may also give you specific instructions. While your treatment has been planned according to the most current medical practices available, unavoidable complications occasionally occur.   ACTIVITY You may resume your regular activity, but move at a slower pace for the next 24 hours.  Take frequent rest periods for the next 24 hours.  Walking will help get rid of the air and reduce the bloated feeling in your belly (abdomen).  No driving for 24 hours (because of the medicine (anesthesia) used during the test).   Do not sign any important legal documents or operate any machinery for 24 hours (because of the anesthesia used during the test).  NUTRITION Drink plenty of fluids.  You may resume your normal diet as instructed by your doctor.  Begin with a light meal and progress to your normal diet. Heavy or fried foods are harder to digest and may make you feel sick to your stomach (nauseated).  Avoid alcoholic beverages for 24 hours or as instructed.  MEDICATIONS You may resume your normal medications unless your doctor tells you otherwise.  WHAT YOU CAN EXPECT TODAY Some feelings of bloating in the abdomen.  Passage of more gas than usual.  Spotting of blood in your stool or on the toilet paper.  IF YOU HAD POLYPS REMOVED DURING THE COLONOSCOPY: No aspirin products for 7 days or as instructed.  No alcohol for 7 days or as instructed.  Eat a soft diet for the next 24 hours.  FINDING OUT THE RESULTS OF YOUR TEST Not all test results are available during your visit. If your test results are not back during the visit, make an appointment with your caregiver to find out the results. Do not assume everything is normal if you have not heard from your  caregiver or the medical facility. It is important for you to follow up on all of your test results.  SEEK IMMEDIATE MEDICAL ATTENTION IF: You have more than a spotting of blood in your stool.  Your belly is swollen (abdominal distention).  You are nauseated or vomiting.  You have a temperature over 101.  You have abdominal pain or discomfort that is severe or gets worse throughout the day.   Your colonoscopy was relatively unremarkable.  I did not find any polyps or evidence of colon cancer.  I do not think you need further colonoscopy for surveillance purposes.  You do have diverticulosis and internal hemorrhoids. I would recommend increasing fiber in your diet or adding OTC Benefiber/Metamucil. Be sure to drink at least 4 to 6 glasses of water daily. Follow-up with GI in 3 months   I hope you have a great rest of your week!  Hennie Duos. Marletta Lor, D.O. Gastroenterology and Hepatology [facility]

## 2023-10-12 NOTE — Anesthesia Preprocedure Evaluation (Signed)
Anesthesia Evaluation  Patient identified by MRN, date of birth, ID band Patient awake    Reviewed: Allergy & Precautions, H&P , NPO status , Patient's Chart, lab work & pertinent test results, reviewed documented beta blocker date and time   Airway Mallampati: II  TM Distance: >3 FB Neck ROM: full    Dental  (+) Edentulous Upper, Edentulous Lower   Pulmonary neg pulmonary ROS, former smoker   Pulmonary exam normal breath sounds clear to auscultation       Cardiovascular Exercise Tolerance: Good hypertension, negative cardio ROS  Rhythm:regular Rate:Normal     Neuro/Psych negative neurological ROS  negative psych ROS   GI/Hepatic Neg liver ROS,GERD  ,,  Endo/Other  negative endocrine ROSdiabetes, Type 2    Renal/GU CRFRenal diseaseCKD stage 3  negative genitourinary   Musculoskeletal   Abdominal   Peds  Hematology negative hematology ROS (+)   Anesthesia Other Findings Breast cancer history  Reproductive/Obstetrics negative OB ROS                             Anesthesia Physical Anesthesia Plan  ASA: 3  Anesthesia Plan: General   Post-op Pain Management: Minimal or no pain anticipated   Induction: Intravenous  PONV Risk Score and Plan: Propofol infusion  Airway Management Planned: Natural Airway and Nasal Cannula  Additional Equipment: None  Intra-op Plan:   Post-operative Plan:   Informed Consent: I have reviewed the patients History and Physical, chart, labs and discussed the procedure including the risks, benefits and alternatives for the proposed anesthesia with the patient or authorized representative who has indicated his/her understanding and acceptance.     Dental Advisory Given  Plan Discussed with: CRNA  Anesthesia Plan Comments:         Anesthesia Quick Evaluation

## 2023-10-12 NOTE — Transfer of Care (Signed)
Immediate Anesthesia Transfer of Care Note  Patient: [name]  Procedure(s) Performed: COLONOSCOPY WITH PROPOFOL  Patient Location: Endoscopy Unit  Anesthesia Type:General  Level of Consciousness: awake, alert , and oriented  Airway & Oxygen Therapy: Patient Spontanous Breathing  Post-op Assessment: Report given to RN and Post -op Vital signs reviewed and stable  Post vital signs: Reviewed and stable  Last Vitals:  Vitals Value Taken Time  BP    Temp    Pulse    Resp    SpO2      Last Pain:  Vitals:   10/12/23 1113  TempSrc:   PainSc: 0-No pain      Patients Stated Pain Goal: 9 (10/12/23 1006)  Complications: No notable events documented.

## 2023-10-12 NOTE — Anesthesia Postprocedure Evaluation (Signed)
Anesthesia Post Note  Patient: [name]  Procedure(s) Performed: COLONOSCOPY WITH PROPOFOL  Patient location during evaluation: PACU Anesthesia Type: General Level of consciousness: awake and alert Pain management: pain level controlled Vital Signs Assessment: post-procedure vital signs reviewed and stable Respiratory status: spontaneous breathing, nonlabored ventilation, respiratory function stable and patient connected to nasal cannula oxygen Cardiovascular status: blood pressure returned to baseline and stable Postop Assessment: no apparent nausea or vomiting Anesthetic complications: no   There were no known notable events for this encounter.   Last Vitals:  Vitals:   10/12/23 1006 10/12/23 1133  BP: 138/78 (!) 123/56  Pulse: (!) 101 (!) 103  Resp: 17 (!) 22  Temp: 36.6 C 36.4 C  SpO2: 96% 100%    Last Pain:  Vitals:   10/12/23 1133  TempSrc: Oral  PainSc: 0-No pain                 Houston Surges L Harve Spradley

## 2023-10-19 ENCOUNTER — Encounter (HOSPITAL_COMMUNITY): Payer: Self-pay | Admitting: Internal Medicine

## 2023-12-06 ENCOUNTER — Encounter: Payer: Self-pay | Admitting: Gastroenterology

## 2023-12-13 NOTE — Progress Notes (Unsigned)
GI Office Note    Referring Provider: Gwenlyn Found, MD Primary Care Physician:  Gwenlyn Found, MD  Primary Gastroenterologist: Hennie Duos. Marletta Lor, DO   Chief Complaint   No chief complaint on file.   History of Present Illness   [name] is a 78 y.o. female presenting today for follow-up.  She was last seen in the office in October.  Pleated colonoscopy back in November, overall unremarkable.  She has a prior history of iron deficiency anemia, potentially related to total knee replacement in June 2024 close with chronic kidney disease.  Labs from October 2024: Hemoglobin 11 slightly low but improved from June 2024 when it was 9.4., hematocrit 36.5 normal, iron low at 36, TIBC high at 553, iron saturation is low at 7%, ferritin low normal at 15, B12 normal at 741.  Colonoscopy November 2024: -Nonbleeding internal hemorrhoids -Diverticulosis in the descending colon and transverse colon -Medium sized lipoma in the transverse colon    Medications   Current Outpatient Medications  Medication Sig Dispense Refill   amLODipine-olmesartan (AZOR) 5-40 MG tablet Take 1 tablet by mouth in the morning.     anastrozole (ARIMIDEX) 1 MG tablet Take 1 tablet (1 mg total) by mouth daily. 90 tablet 3   Blood Glucose Calibration (ACCU-CHEK AVIVA) SOLN      Blood Glucose Monitoring Suppl (ACCU-CHEK AVIVA PLUS) w/Device KIT      calcium carbonate (TUMS EX) 750 MG chewable tablet Chew 2 tablets by mouth in the morning.     Cholecalciferol (VITAMIN D3) 1000 units CAPS Take 1,000 Units by mouth every evening.     cyanocobalamin (VITAMIN B12) 1000 MCG tablet Take 1,000 mcg by mouth every evening.     fenofibrate 54 MG tablet Take 54 mg by mouth every evening.     GEMTESA 75 MG TABS Take 75 mg by mouth in the morning.     glucose blood test strip 1 each 2 (two) times daily. Check Blood sugar 1-2 times daily     IRON, FERROUS SULFATE, PO Take 1 tablet by mouth every other day.      metFORMIN (GLUCOPHAGE) 1000 MG tablet Take 1,000 mg by mouth 2 (two) times daily with a meal.      Multiple Vitamin (MULTIVITAMIN WITH MINERALS) TABS tablet Take 1 tablet by mouth every morning. Women's One A Day 50+     Omega-3 Fatty Acids (FISH OIL) 1000 MG CAPS Take 1,000 mg by mouth every evening.     omeprazole (PRILOSEC) 10 MG capsule Take 10 mg by mouth daily before breakfast.     simvastatin (ZOCOR) 40 MG tablet Take 40 mg by mouth at bedtime.      No current facility-administered medications for this visit.    Allergies   Allergies as of 12/14/2023   (No Known Allergies)     Past Medical History   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   History of kidney stones    History of radiation therapy 09-10-2015 to [phone]   left breast 50Gy  Iron deficiency anemia    Malignant neoplasm of lower-inner quadrant of left breast in female, estrogen receptor positive [facility]) oncologist-  dr Minta Balsam higgs ([facility])   dx 08-16-2015--  Stage 1A invasisive (T1c,N0,M0), ER/PR+, HER2 negative---- 06-26-2015 left partial mastectomy , 07-31-2015 left axilla node dissection x1 (negative)-- completed radiation 10-08-2015 and started arimidex 10-16-2015   Mixed hyperlipidemia    Nephrolithiasis    bilateral per CT 04-28-2018   Personal history of radiation therapy 2016   Type 2 diabetes mellitus (HCC)    followed by pcp   Ureteral calculi    left   Urgency of urination    Wears glasses     Past Surgical History   Past Surgical History:  Procedure Laterality Date   ABDOMINAL HYSTERECTOMY  2005   AXILLARY SENTINEL  NODE BIOPSY Left 07/31/2015   Procedure: SENTINEL LYMPH NODE BIOPSY, LEFT AXILLA, ;  Surgeon: Franky Macho, MD;  Location: [facility];  Service: General;  Laterality: Left;  Sentinel Node @ 1000   BREAST BIOPSY Left 06/09/2016   Procedure: LEFT BREAST BIOPSY AFTER NEEDLE LOCALIZATION;  Surgeon: Franky Macho, MD;  Location: [facility];  Service: General;  Laterality: Left;   BREAST LUMPECTOMY Right 2023   Secretory carcinoma, invasive and in situ   CARPAL TUNNEL RELEASE Left 11/02/2014   Procedure: CARPAL TUNNEL RELEASE;  Surgeon: Dairl Ponder, MD;  [facility]: [facility];  Service: Orthopedics;  Laterality: Left;   CATARACT EXTRACTION W/ INTRAOCULAR LENS  IMPLANT, BILATERAL  2007   COLONOSCOPY N/A 02/03/2017   Procedure: COLONOSCOPY;  Surgeon: West Bali, MD;  [facility]: [facility];  Service: Endoscopy;  Laterality: N/A;  1245   COLONOSCOPY N/A 03/01/2020   Procedure: COLONOSCOPY;  Surgeon: West Bali, MD;  [facility];  Service: Endoscopy;  Laterality: N/A;  10:30am   COLONOSCOPY W/ BIOPSIES AND POLYPECTOMY     COLONOSCOPY WITH PROPOFOL N/A 10/12/2023   Procedure: COLONOSCOPY WITH PROPOFOL;  Surgeon: Lanelle Bal, DO;  [facility];  Service: Endoscopy;  Laterality: N/A;  11:15am, asa 2   CYSTOSCOPY WITH RETROGRADE PYELOGRAM, URETEROSCOPY AND STENT PLACEMENT Left 05/09/2018   Procedure: CYSTOSCOPY WITH RETROGRADE PYELOGRAM, URETEROSCOPY AND STENT PLACEMENT;  Surgeon: Malen Gauze, MD;  Location: [facility];  Service: Urology;  Laterality: Left;   CYSTOSCOPY/URETEROSCOPY/HOLMIUM LASER/STENT PLACEMENT Bilateral 03/18/2017   Procedure: CYSTOSCOPY/ BILATERAL RETROGRADE/RIGHT URETEROSCOPY/ RIGHT HOLMIUM LASER APPLICATION/RIGHT URETERAL STENT PLACEMENT;  Surgeon: Bjorn Pippin, MD;  Location: [facility];  Service: Urology;  Laterality: Bilateral;   EXTRACORPOREAL SHOCK WAVE LITHOTRIPSY Right 12/14/2019   Procedure: EXTRACORPOREAL SHOCK WAVE LITHOTRIPSY  (ESWL);  Surgeon: Jerilee Field, MD;  Location: [facility];  Service: Urology;  Laterality: Right;   EXTRACORPOREAL SHOCK WAVE LITHOTRIPSY Left 02/01/2020   Procedure: EXTRACORPOREAL SHOCK WAVE LITHOTRIPSY (ESWL);  Surgeon: Sebastian Ache, MD;  Location[facility];  Service: Urology;  Laterality: Left;   HOLMIUM LASER APPLICATION Left 05/09/2018   Procedure: HOLMIUM LASER APPLICATION;  Surgeon: Malen Gauze, MD;  Location: [facility];  Service: Urology;  Laterality: Left;   KNEE ARTHROSCOPY WITH LATERAL MENISECTOMY Right 10/18/2020   Procedure: KNEE ARTHROSCOPY WITH LATERAL MENISECTOMY;  Surgeon: Vickki Hearing, MD;  [facility];  Service: Orthopedics;  Laterality: Right;   OPEN REDUCTION INTERNAL FIXATION (ORIF) DISTAL RADIAL FRACTURE Left 11/02/2014   Procedure: OPEN REDUCTION INTERNAL FIXATION (ORIF) DISTAL RADIAL FRACTURE;  Surgeon: Dairl Ponder, MD;  [facility];  Service: Orthopedics;  Laterality: Left;   PARTIAL MASTECTOMY WITH NEEDLE LOCALIZATION Left 06/26/2015   Procedure: PARTIAL MASTECTOMY WITH NEEDLE LOCALIZATION;  Surgeon: Franky Macho, MD;  [facility];  Service: General;  Laterality: Left;  Needle Loc @ 8:00am   POLYPECTOMY  02/03/2017   Procedure: POLYPECTOMY;  Surgeon: West Bali, MD;  [facility];  Service: Endoscopy;;  descending and hepatic flexure   POLYPECTOMY  03/01/2020   Procedure: POLYPECTOMY;  Surgeon: West Bali, MD;  [facility];  Service: Endoscopy;;  ascending;splenic flexure polyps x2;rectal   TARSAL METATARSAL ARTHRODESIS Left 09-07-2007   dr Lestine Box  [facility]   first and second tarsal metatarsal fusion, gastroc slide, neurolysis   TOTAL ABDOMINAL HYSTERECTOMY W/ BILATERAL SALPINGOOPHORECTOMY  10-21-2004   dr soper  [facility]   TOTAL KNEE ARTHROPLASTY Right 04/27/2023   Procedure: TOTAL KNEE ARTHROPLASTY;  Surgeon: Vickki Hearing, MD;  Location: [facility];   Service: Orthopedics;  Laterality: Right;    Past Family History   Family History  Problem Relation Age of Onset   Hypertension Mother    Osteoporosis Mother    Stomach cancer Mother    Diabetes Sister        x3   Colon cancer Neg Hx     Past Social History   Social History   Socioeconomic History   Marital status: Married    Spouse name: hubert   Number of children: 2   Years of education: gef   Highest education level: Not on file  Occupational History   Occupation: Retired  Tobacco Use   Smoking status: Former    Current packs/day: 0.00    Average packs/day: 1 pack/day for 20.0 years (20.0 ttl pk-yrs)    Types: Cigarettes    Start date: 06/05/1966    Quit date: 06/05/1986    Years since quitting: 37.5   Smokeless tobacco: Never  Vaping Use   Vaping status: Never Used  Substance and Sexual Activity   Alcohol use: No   Drug use: No   Sexual activity: Not Currently    Birth control/protection: Surgical  Other Topics Concern   Not on file  Social History Narrative   Not on file   Social Drivers of Health   Financial Resource Strain: Not on file  Food Insecurity: Low Risk  (08/26/2023)   Received from [facility]   Hunger Vital Sign    Worried About Running Out of Food in the Last Year: Never true    Ran Out of Food in the Last Year: Never true  Transportation Needs: No Transportation Needs (08/26/2023)   Received from [employer]    In the past 12 months, has lack of reliable transportation kept you from medical appointments, meetings, work or from getting things needed for daily living? : No  Physical Activity: Not on file  Stress: Not on file  Social Connections: Not on file  Intimate Partner Violence: Not At Risk (04/27/2023)   Humiliation, Afraid, Rape, and Kick questionnaire    Fear of Current or Ex-Partner: No    Emotionally Abused: No    Physically Abused: No    Sexually Abused: No    Review of Systems   General:  Negative for anorexia, weight loss, fever, chills, fatigue, weakness. ENT: Negative for hoarseness, difficulty swallowing , nasal congestion. CV: Negative for chest pain, angina, palpitations, dyspnea on exertion, peripheral edema.  Respiratory: Negative for dyspnea at rest, dyspnea on exertion, cough, sputum, wheezing.  GI: See  history of present illness. GU:  Negative for dysuria, hematuria, urinary incontinence, urinary frequency, nocturnal urination.  Endo: Negative for unusual weight change.     Physical Exam   There were no vitals taken for this visit.   General: Well-nourished, well-developed in no acute distress.  Eyes: No icterus. Mouth: Oropharyngeal mucosa moist and pink , no lesions erythema or exudate. Lungs: Clear to auscultation bilaterally.  Heart: Regular rate and rhythm, no murmurs rubs or gallops.  Abdomen: Bowel sounds are normal, nontender, nondistended, no hepatosplenomegaly or masses,  no abdominal bruits or hernia , no rebound or guarding.  Rectal: ***  Extremities: No lower extremity edema. No clubbing or deformities. Neuro: Alert and oriented x 4   Skin: Warm and dry, no jaundice.   Psych: Alert and cooperative, normal mood and affect.  Labs   *** Imaging Studies   No results found.  Assessment       PLAN   ***   Leanna Battles. Melvyn Neth, MHS, PA-C [facility]

## 2023-12-14 ENCOUNTER — Ambulatory Visit: Payer: Medicare HMO | Admitting: Gastroenterology

## 2023-12-14 ENCOUNTER — Encounter: Payer: Self-pay | Admitting: Gastroenterology

## 2023-12-14 VITALS — BP 150/70 | HR 94 | Temp 98.6°F | Ht 65.0 in | Wt 161.2 lb

## 2023-12-14 DIAGNOSIS — Z860101 Personal history of adenomatous and serrated colon polyps: Secondary | ICD-10-CM | POA: Diagnosis not present

## 2023-12-14 DIAGNOSIS — D509 Iron deficiency anemia, unspecified: Secondary | ICD-10-CM

## 2023-12-14 NOTE — Patient Instructions (Signed)
Please follow up with your PCP in April. Request for labs to include anemia labs. I will follow up on results. Continue iron every other day for now. Monitor for worsening fatigue, shortness of breath, dizziness as these can be signs of worsening anemia. Monitor for change in bowel habits or black or bloody stools. Let us know if you have any concerns. We will plan to see you back on an as needed basis. Let us know if you have any questions or concerns.

## 2024-04-20 ENCOUNTER — Ambulatory Visit: Payer: Medicare HMO | Admitting: Orthopedic Surgery

## 2024-04-27 ENCOUNTER — Encounter: Payer: Self-pay | Admitting: Orthopedic Surgery

## 2024-04-27 ENCOUNTER — Other Ambulatory Visit (INDEPENDENT_AMBULATORY_CARE_PROVIDER_SITE_OTHER): Payer: Self-pay

## 2024-04-27 ENCOUNTER — Ambulatory Visit: Admitting: Orthopedic Surgery

## 2024-04-27 DIAGNOSIS — Z96651 Presence of right artificial knee joint: Secondary | ICD-10-CM

## 2024-04-27 DIAGNOSIS — M1711 Unilateral primary osteoarthritis, right knee: Secondary | ICD-10-CM

## 2024-04-27 NOTE — Progress Notes (Signed)
   There were no vitals taken for this visit.  There is no height or weight on file to calculate BMI.  Chief Complaint  Patient presents with   Post-op Follow-up    04/27/23 / has tightness behind knee walking with knee flexed some    Encounter Diagnoses  Name Primary?   Status post total knee replacement, right Yes   Primary osteoarthritis of right knee     DOI/DOS/ Date: 04/27/23  Unchanged since last visit

## 2024-04-27 NOTE — Progress Notes (Signed)
   Chief Complaint  Patient presents with   Post-op Follow-up    04/27/23 / has tightness behind knee walking with knee flexed some    Encounter Diagnoses  Name Primary?   Status post total knee replacement, right Yes   Primary osteoarthritis of right knee     DOI/DOS/ Date: 04/27/23  1 year out from her right total knee her main complaint seems to be related to her sciatica with anterolateral leg pain and some numbness in anteroportion of her leg occasional right lower back pain which she describes as hip pain  She has 0 to 125 degree range of motion with good stability in all planes  DG Knee AP/LAT W/Sunrise Right Result Date: 04/27/2024 Postop image right knee 1 year after total knee arthroplasty Attune fixed-bearing posterior stabilized total knee X-rays show no lucent lines Normal alignment Appropriate sizing No signs of loosening or issues with the right total knee arthroplasty     Assessment and plan stable postop total knee at 1 year with no signs of issues problems or complaints  Follow as needed

## 2024-06-06 ENCOUNTER — Ambulatory Visit (HOSPITAL_COMMUNITY)
Admission: RE | Admit: 2024-06-06 | Discharge: 2024-06-06 | Disposition: A | Discharge status: Home / Self Care | Payer: Medicare HMO | Source: Ambulatory Visit | Attending: Hematology | Admitting: Hematology

## 2024-06-06 DIAGNOSIS — Z170 Estrogen receptor positive status [ER+]: Secondary | ICD-10-CM | POA: Diagnosis present

## 2024-06-06 DIAGNOSIS — Z853 Personal history of malignant neoplasm of breast: Secondary | ICD-10-CM

## 2024-06-06 DIAGNOSIS — C50312 Malignant neoplasm of lower-inner quadrant of left female breast: Secondary | ICD-10-CM | POA: Diagnosis present

## 2024-09-05 ENCOUNTER — Inpatient Hospital Stay: Payer: Medicare HMO | Attending: Oncology

## 2024-09-05 DIAGNOSIS — C50312 Malignant neoplasm of lower-inner quadrant of left female breast: Secondary | ICD-10-CM | POA: Insufficient documentation

## 2024-09-05 DIAGNOSIS — Z87891 Personal history of nicotine dependence: Secondary | ICD-10-CM | POA: Diagnosis not present

## 2024-09-05 DIAGNOSIS — Z79811 Long term (current) use of aromatase inhibitors: Secondary | ICD-10-CM | POA: Diagnosis not present

## 2024-09-05 DIAGNOSIS — Z1721 Progesterone receptor positive status: Secondary | ICD-10-CM | POA: Insufficient documentation

## 2024-09-05 DIAGNOSIS — Z1732 Human epidermal growth factor receptor 2 negative status: Secondary | ICD-10-CM | POA: Insufficient documentation

## 2024-09-05 DIAGNOSIS — Z923 Personal history of irradiation: Secondary | ICD-10-CM | POA: Diagnosis not present

## 2024-09-05 DIAGNOSIS — Z800 Family history of malignant neoplasm of digestive organs: Secondary | ICD-10-CM | POA: Diagnosis not present

## 2024-09-05 DIAGNOSIS — Z170 Estrogen receptor positive status [ER+]: Secondary | ICD-10-CM | POA: Insufficient documentation

## 2024-09-05 LAB — CBC WITH DIFFERENTIAL/PLATELET
Abs Immature Granulocytes: 0.02 K/uL (ref 0.00–0.07)
Basophils Absolute: 0 K/uL (ref 0.0–0.1)
Basophils Relative: 0 %
Eosinophils Absolute: 0.1 K/uL (ref 0.0–0.5)
Eosinophils Relative: 1 %
HCT: 37.3 % (ref 36.0–46.0)
Hemoglobin: 11.8 g/dL — ABNORMAL LOW (ref 12.0–15.0)
Immature Granulocytes: 0 %
Lymphocytes Relative: 21 %
Lymphs Abs: 1.2 K/uL (ref 0.7–4.0)
MCH: 27.3 pg (ref 26.0–34.0)
MCHC: 31.6 g/dL (ref 30.0–36.0)
MCV: 86.3 fL (ref 80.0–100.0)
Monocytes Absolute: 0.6 K/uL (ref 0.1–1.0)
Monocytes Relative: 11 %
Neutro Abs: 3.7 K/uL (ref 1.7–7.7)
Neutrophils Relative %: 67 %
Platelets: 215 K/uL (ref 150–400)
RBC: 4.32 MIL/uL (ref 3.87–5.11)
RDW: 14.7 % (ref 11.5–15.5)
WBC: 5.6 K/uL (ref 4.0–10.5)
nRBC: 0 % (ref 0.0–0.2)

## 2024-09-05 LAB — COMPREHENSIVE METABOLIC PANEL WITH GFR
ALT: 15 U/L (ref 0–44)
AST: 22 U/L (ref 15–41)
Albumin: 4.4 g/dL (ref 3.5–5.0)
Alkaline Phosphatase: 70 U/L (ref 38–126)
Anion gap: 12 (ref 5–15)
BUN: 17 mg/dL (ref 8–23)
CO2: 23 mmol/L (ref 22–32)
Calcium: 10.3 mg/dL (ref 8.9–10.3)
Chloride: 106 mmol/L (ref 98–111)
Creatinine, Ser: 1.08 mg/dL — ABNORMAL HIGH (ref 0.44–1.00)
GFR, Estimated: 52 mL/min — ABNORMAL LOW (ref >60)
Glucose, Bld: 99 mg/dL (ref 70–99)
Potassium: 4.4 mmol/L (ref 3.5–5.1)
Sodium: 141 mmol/L (ref 135–145)
Total Bilirubin: 0.2 mg/dL (ref 0.0–1.2)
Total Protein: 7 g/dL (ref 6.5–8.1)

## 2024-09-05 LAB — VITAMIN D 25 HYDROXY (VIT D DEFICIENCY, FRACTURES): Vit D, 25-Hydroxy: 63.18 ng/mL (ref 30–100)

## 2024-09-12 ENCOUNTER — Inpatient Hospital Stay: Payer: Medicare HMO | Admitting: Oncology

## 2024-09-12 ENCOUNTER — Other Ambulatory Visit (HOSPITAL_COMMUNITY): Payer: Self-pay | Admitting: Oncology

## 2024-09-12 VITALS — BP 138/74 | HR 96 | Temp 99.1°F | Resp 18 | Ht 64.0 in | Wt 161.9 lb

## 2024-09-12 DIAGNOSIS — C50312 Malignant neoplasm of lower-inner quadrant of left female breast: Secondary | ICD-10-CM | POA: Diagnosis not present

## 2024-09-12 DIAGNOSIS — C50919 Malignant neoplasm of unspecified site of unspecified female breast: Secondary | ICD-10-CM | POA: Diagnosis not present

## 2024-09-12 DIAGNOSIS — N63 Unspecified lump in unspecified breast: Secondary | ICD-10-CM

## 2024-09-12 DIAGNOSIS — D508 Other iron deficiency anemias: Secondary | ICD-10-CM | POA: Diagnosis not present

## 2024-09-12 DIAGNOSIS — M8580 Other specified disorders of bone density and structure, unspecified site: Secondary | ICD-10-CM | POA: Diagnosis not present

## 2024-09-12 DIAGNOSIS — Z170 Estrogen receptor positive status [ER+]: Secondary | ICD-10-CM

## 2024-09-12 NOTE — Assessment & Plan Note (Addendum)
-   Continue anastrozole  as she is tolerating it very well. - Repeat diagnostic mammogram per above. -No abnormality noted on today's exam in the left breast.

## 2024-09-12 NOTE — Assessment & Plan Note (Addendum)
-   She has refused radiation therapy. - Continue anastrozole  which she is tolerating well. -Breast exam showed small BB sized area of concern approximately 1 inch from nipple at 12:00. -Previous mammogram from July 2025 recommended follow-up in 6 months and showed 1 cm group of probable dystrophic calcification around same location. -We discussed diagnostic mammogram in the next few weeks or so. - RTC in 6 months with labs a few days before.

## 2024-09-12 NOTE — Assessment & Plan Note (Addendum)
 Recheck iron levels at next visit. She is currently taking iron supplements every other day with vitamin C.

## 2024-09-12 NOTE — Progress Notes (Signed)
 [facility] OFFICE PROGRESS NOTE  [place], [facility]  ASSESSMENT & PLAN:    Assessment & Plan Secretory carcinoma of the breast, unspecified laterality [facility]) - She has refused radiation therapy. - Continue anastrozole  which she is tolerating well. -Breast exam showed small BB sized area of concern approximately 1 inch from nipple at 12:00. -Previous mammogram from July 2025 recommended follow-up in 6 months and showed 1 cm group of probable dystrophic calcification around same location. -We discussed diagnostic mammogram in the next few weeks or so. - RTC in 6 months with labs a few days before. Malignant neoplasm of lower-inner quadrant of left breast in female, estrogen receptor positive (HCC) - Continue anastrozole  as she is tolerating it very well. - Repeat diagnostic mammogram per above. -No abnormality noted on today's exam in the left breast. Osteopenia, unspecified location - Reviewed bone density test from 06/01/2023, T-score -0.8.  This is similar to DEXA scan from February 2022. - Vitamin D  level is 63.18.  Continue calcium  (2 times daily) and vitamin D  1000 units daily. -Repeat DEXA in July 2026. Other iron deficiency anemia Recheck iron levels at next visit. She is currently taking iron supplements every other day with vitamin C.  Orders Placed This Encounter  Procedures   MM 3D DIAGNOSTIC MAMMOGRAM BILATERAL BREAST    Standing Status:   Future    Expected Date:   10/13/2024    Expiration Date:   09/12/2025    Reason for Exam (SYMPTOM  OR DIAGNOSIS REQUIRED):   Per mostrecent mammogram and possible lump to right breast    Preferred imaging location?:   [facility]   CBC with Differential/Platelet    Standing Status:   Future    Expected Date:   03/13/2025    Expiration Date:   06/11/2025    Release to patient:   Immediate   Comprehensive metabolic panel    Standing Status:   Future    Expected Date:   03/13/2025     Expiration Date:   06/11/2025    Release to patient:   Immediate   VITAMIN D  25 Hydroxy (Vit-D Deficiency, Fractures)    Standing Status:   Future    Expected Date:   03/13/2025    Expiration Date:   06/11/2025    Release to patient:   Immediate   Ferritin    Standing Status:   Future    Expected Date:   03/13/2025    Expiration Date:   06/11/2025   Iron and TIBC ([facility] ONLY)    Standing Status:   Future    Expected Date:   03/13/2025    Expiration Date:   06/11/2025    INTERVAL HISTORY: Patient returns for follow-up for breast cancer and vitamin D  deficiency.  Reports overall she is doing fair since her last visit.  Appetite 100% energy levels are 50%.  Has pain in her right hip that is chronic rates it a 2 out of 10.  Has nocturia which is chronic as well.  Reports she has chronic kidney stones although no recent lithotripsy.  Numbness in her hands.  Patient had anoscopy on 10/12/2023 with Dr. Cindie which showed a nonbleeding internal hemorrhoids.  Diverticulosis in the descending colon and transverse colon.  Medium size lipoma in transverse colon.  She was instructed to start taking iron supplements from her PCP which she takes every other day with vitamin C.  We reviewed vitamin D , CBC and CMP.  SUMMARY  OF HEMATOLOGIC HISTORY: Oncology History Overview Note  1.  Stage I (T1BN0) right breast-secretory carcinoma: - Right lumpectomy and SLNB on 08/10/2022 - Pathology: 8 mm secretory carcinoma, invasive and in situ.  Grade 2/3.  Less than 1 mm from inferior margin.  0/6 sentinel lymph nodes involved.  Case was sent to [facility] for expert review.  ER 30% positive, strong staining.  PR 0%.  Ki-67 5%.  HER2 negative ([facility] 0).   2.  Stage I left breast IDC: - Lumpectomy on 06/26/2015, ER/PR positive and HER2 negative - Radiation therapy completed on 10/09/2015 - Anastrozole  started in November 2016, taking for total of 10 years   3.  Social/family history: - She lives at  home with her husband and her 2 sons.  She is independent of ADLs and IADLs. - She worked in textile's prior to retirement.  Quit smoking in late 1980s.  Smoked for 20 years. - Mother had stomach cancer.   Secretory carcinoma of the breast, unspecified laterality (HCC)  08/16/2015 Initial Diagnosis   Breast cancer (HCC)   09/11/2015 -  Radiation Therapy   Dr. 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 ER+ 100%, PR+ 80%   05/02/2015 Procedure   Left breast stereotactic core needle biopsy   06/26/2015 Oncotype testing   ONCOTYPE DX assay with recurrence score result of 5, Low risk, 10 year risk of distance recurrence tamoxifen alone 5%   06/26/2015 Definitive Surgery   Breast partial mastectomy after needle localization with Dr. Mavis no sentinel node performed   06/26/2015 Pathology Results   INVASIVE DUCTAL CARCINOMA, 2 CM. - HIGH GRADE DUCTAL CARCINOMA IN SITU. - INVASIVE CARCINOMA FOCALLY LESS THAN 0.1 CM FROM LATERAL MARGIN. - DUCTAL CARCINOMA IN SITU FOCALLY LESS THAN 0.1 CM FROM THE LATERAL MARGIN.   06/26/2015 Pathology Results   ER+ 100%, PR + 60%, Ki-67 25%, HER2 NEGATIVE   07/31/2015 Procedure   Sentinel node procedure   07/31/2015 Pathology Results   ONE BENIGN LYMPH NODE (0/1).   08/26/2015 Imaging   Bone density- Normal   09/10/2015 - 10/08/2015 Radiation Therapy   50 Gy left breast by Dr. Izell.   10/16/2015 -  Anti-estrogen oral therapy   Arimidex    05/08/2016 Procedure   Stereotactic-guided biopsies of the calcifications of left breast.   05/08/2016 Pathology Results   Breast, left, needle core biopsy, lateral and ant to  lumpectomy site FIBROCYSTIC CHANGES VASCULAR CALCIFICATIONS NO EVIDENCE OF MALIGNANCY   05/08/2016 Pathology Results   2. Breast, left, needle core biopsy, lumpectomy site FAT NECROSIS WITH FIBROSIS AND MICROCALCIFICATIONS NO EVIDENCE OF MALIGNANCY   06/09/2016 Surgery   Left breast biopsy for microcalcifications. Path: benign.      CBC    Component Value Date/Time   WBC 5.6 09/05/2024 1333   RBC 4.32 09/05/2024 1333   HGB 11.8 (L) 09/05/2024 1333   HCT 37.3 09/05/2024 1333   PLT 215 09/05/2024 1333   MCV 86.3 09/05/2024 1333   MCH 27.3 09/05/2024 1333   MCHC 31.6 09/05/2024 1333   RDW 14.7 09/05/2024 1333   LYMPHSABS 1.2 09/05/2024 1333   MONOABS 0.6 09/05/2024 1333   EOSABS 0.1 09/05/2024 1333  BASOSABS 0.0 09/05/2024 1333       Latest Ref Rng & Units 09/05/2024    1:33 PM 09/07/2023    1:29 PM 04/28/2023    4:43 AM  CMP  Glucose 70 - 99 mg/dL 99  894  893   BUN 8 - 23 mg/dL 17  31  24    Creatinine 0.44 - 1.00 mg/dL 8.91  8.79  8.57   Sodium 135 - 145 mmol/L 141  141  135   Potassium 3.5 - 5.1 mmol/L 4.4  3.9  4.6   Chloride 98 - 111 mmol/L 106  109  104   CO2 22 - 32 mmol/L 23  22  22    Calcium  8.9 - 10.3 mg/dL 89.6  89.8  8.3   Total Protein 6.5 - 8.1 g/dL 7.0  7.1    Total Bilirubin 0.0 - 1.2 mg/dL 0.2  0.5    Alkaline Phos 38 - 126 U/L 70  54    AST 15 - 41 U/L 22  21    ALT 0 - 44 U/L 15  16       Lab Results  Component Value Date   VITAMINB12 741 09/07/2023    Vitals:   09/12/24 1405 09/12/24 1409  BP: (!) 154/71 138/74  Pulse: 96   Resp: 18   Temp: 99.1 F (37.3 C)   SpO2: 97%     Review of System:  Review of Systems  Constitutional:  Positive for malaise/fatigue. Negative for weight loss.  Genitourinary:  Positive for frequency.  Musculoskeletal:  Positive for joint pain.  Neurological:  Positive for tingling.    Physical Exam: Physical Exam Constitutional:      Appearance: Normal appearance. She is obese.  HENT:     Head:  Normocephalic and atraumatic.  Eyes:     Pupils: Pupils are equal, round, and reactive to light.  Cardiovascular:     Rate and Rhythm: Normal rate and regular rhythm.     Heart sounds: Normal heart sounds. No murmur heard. Pulmonary:     Effort: Pulmonary effort is normal.     Breath sounds: Normal breath sounds. No wheezing.  Chest:  Breasts:    Right: Mass present. No swelling or tenderness.     Left: No swelling, mass or tenderness.       Comments: Small area of abnormality about an inch from nipple around 12:00.  It is along the line of her lumpectomy scar. Abdominal:     General: Bowel sounds are normal. There is no distension.     Palpations: Abdomen is soft.     Tenderness: There is no abdominal tenderness.  Musculoskeletal:        General: Normal range of motion.     Cervical back: Normal range of motion.  Lymphadenopathy:     Upper Body:     Right upper body: No supraclavicular, axillary or pectoral adenopathy.     Left upper body: No supraclavicular, axillary or pectoral adenopathy.  Skin:    General: Skin is warm and dry.     Findings: No rash.  Neurological:     Mental Status: She is alert and oriented to person, place, and time.     Gait: Gait is intact.  Psychiatric:        Mood and Affect: Mood and affect normal.        Cognition and Memory: Memory normal.        Judgment: Judgment normal.      I spent  20 minutes dedicated to the care of this patient (face-to-face and non-face-to-face) on the date of the encounter to include what is described in the assessment and plan.,  Delon Hope, NP 09/12/2024 2:48 PM

## 2024-09-12 NOTE — Assessment & Plan Note (Addendum)
-   Reviewed bone density test from 06/01/2023, T-score -0.8.  This is similar to DEXA scan from February 2022. - Vitamin D  level is 63.18.  Continue calcium  (2 times daily) and vitamin D  1000 units daily. -Repeat DEXA in July 2026.

## 2024-09-18 NOTE — Progress Notes (Signed)
 [facility] [place]  Return Patient Visit [name] DOB: 11-Feb-1946  MRN: [id number] Visit Date: 09/18/2024  Encounter Provider: Laneta Tanda Agent, PA-C  Subjective Carlyn Lemke is a 78 y.o. female who presents for Diabetes, GERD, Hypertension, Hyperlipidemia, Anemia, and Chronic Kidney Disease History of Present Illness The patient is a 78 year old female who presents for a medication check.  HTN Home BPs- at goal, typically upper 120s-130/60s. Checks with wrist cuff; she cannot do BP on arms due to breast cancer history. Meds- amlodipine -olmesartan  5-40 mg daily Prior meds- hctz (kidney stones and hypercalcemia)  Secondary workup- incidental 1.1x1.7 cm left adrenal adenoma on 2024 MRI   HLD Symptoms- loss of vision (no), CP (no), DOE (no), palpitations (rarely), claudication (n, but complains of occasional nocturnal leg cramps; approx 1x/week) Risk factors- former smoker, fhx, HTN, DMT2 fhx- sister with heart disease Meds- simvastatin  40 mg nightly and fenofibrate  54 mg daily Imaging- aortoiliac atherosclerosis noted in 2024 abd MRI Lab Results  Component Value Date   CHOL 173 03/09/2024   HDL 37 (L) 03/09/2024   LDLCALC 95 03/09/2024   LDLDIRECT 115 08/01/2020   TRIG 305 (H) 03/09/2024   Diabetes Symptoms- blurry vision (no), polydipsia (yes), polyuria (yes), neuropathy (not diabetic)  Home BGs-fasting average 120-130 over last month (was 110s).  Hypoglycemic events:  No Complications/Comorbidities- CKD Diet- tries to follow diabetic diet. 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 Final pathology revealed both a secretive invasive and DCIS malignancy. 1 margin was listed as less than 1 mm, but clear. 6 lymph nodes negative.  Repeat mammo UTD 06/06/24; recently found lump along scar tissue and going to have diagnostic mammogram 09/19/2024 Dexa UTD 06/01/23, normal Followed by oncology.   Mixed incontinence Managed by [facility]. Prescribed Gemtesa 75 mg daily and oxybutynin 5 mg nightly.  Last visit 06/22/2024 and following up yearly.   Objective Blood pressure 142/80, pulse 79, temperature 98.2 F (36.8 C), resp. rate 16, height 1.575 m (5' 2), weight 73.5 kg (162 lb), SpO2 97%. Physical Exam   Physical Exam Constitutional:      General: She is not in acute distress.    Appearance: Normal appearance. She is not ill-appearing, toxic-appearing or diaphoretic.  HENT:     Head: Normocephalic and atraumatic.     Right Ear: External ear normal.     Left Ear: External ear normal.     Nose: Nose normal.  Eyes:     Extraocular Movements: Extraocular movements intact.  Cardiovascular:     Rate and Rhythm: Normal rate and regular rhythm.     Heart sounds: Normal heart sounds. No murmur heard.    No friction rub. No gallop.  Pulmonary:     Effort: Pulmonary effort is normal. No respiratory distress.     Breath sounds:  Normal breath sounds. No stridor. No wheezing, rhonchi or rales.  Musculoskeletal:     Cervical back: Normal range of motion and neck supple.  Skin:    General: Skin is warm and dry.     Capillary Refill: Capillary refill takes less than 2 seconds.  Neurological:     General: No focal deficit  present.     Mental Status: She is alert and oriented to person, place, and time. Mental status is at baseline.  Psychiatric:        Mood and Affect: Mood normal.        Behavior: Behavior normal.        Thought Content: Thought content normal.        Judgment: Judgment normal.    Diabetic Foot Exam Performed: Yes Diabetic Foot Exam Result:  [x]  Normal The patient's foot has been visually inspected with no ulcers, deformities, or abnormal toenails.  The patient can see the bottom of his/her feet and shoes fit properly. The patient can feel the 10g nylon filament on both feet for the 1st, 3rd, 5th metatarsals and big toes.  The patient's pedal pulses are palpable on both feet at dorsalis pedis and/or posterior tibial.    Medical History: Medical History[1]  Patient Active Problem List   Diagnosis Date Noted  . Hypertension associated with diabetes    (CMD) 09/18/2024  . IDA (iron deficiency anemia) 09/08/2023  . H/O adenomatous polyp of colon 09/08/2023  . Obesity, diabetes, and hypertension syndrome (HCC) 08/13/2022  . Type 2 diabetes mellitus with hyperlipidemia (HCC) 08/13/2022  . Urinary incontinence, mixed 08/13/2022  . Former tobacco use 08/13/2022  . Colorectal polyps 04/10/2020  . Stage 3a chronic kidney disease (HCC) 07/21/2017  . Kidney stone 01/13/2017  . Class 1 obesity due to excess calories with serious comorbidity in adult 07/16/2016  . Controlled type 2 diabetes mellitus with stage 3 chronic kidney disease (HCC) 01/12/2016  . Gastroesophageal reflux disease without esophagitis 01/12/2016  . Hyperlipidemia associated with type 2 diabetes mellitus    (CMD) 01/12/2016  . Benign essential hypertension 01/12/2016  . History of cancer of left breast 10/16/2015   Current Medications:  Medications Ordered Prior to Encounter[2] Current Medications[3]  Allergies: Allergies[4]   Immunizations:  Immunization History  Administered Date(s) Administered  . Influenza,  High-dose Seasonal, Quadrivalent, Preservative Free 09/13/2019, 08/01/2020, 07/31/2021, 08/13/2022  . Influenza, Unspecified 09/29/2015  . Influenza, high-dose, trivalent, PF 07/26/2014, 08/29/2016, 07/21/2017, 07/27/2018, 08/26/2023  . Moderna SARS-CoV-2 Primary Series 12+ yrs 03/11/2020, 04/08/2020  . Pneumococcal Conjugate 13-Valent 10/18/2014  . Pneumococcal Conjugate Vaccine 20-Valent (PREVNAR-20) 6 wks+ 08/13/2022  . Pneumococcal Polysaccharide Vaccine, 23 Valent (PNEUMOVAX-23) 2Y+ 11/02/2011  . TD vaccine (ADULT)PF(TENIVAC) 7Y+ 11/16/2005  . TDAP VACCINE (BOOSTRIX,ADACEL) 7Y+ 07/27/2018  . Varicella Zoster [facility]) 18Y+ 04/13/2018, 06/14/2018  . Zoster, Live 10/06/2013    Surgical History- Surgical History[5]  Family history- Family History[6] Social history- Social History[7]   Labs: No results found for this or any previous visit (from the past week).    Assessment & Plan 1. Medication monitoring encounter (Primary) - Lipid Panel - Comprehensive Metabolic Panel - CBC with Differential - Hemoglobin A1C With Estimated Average Glucose - Albumin, Random Urine  2. Hypertension associated with diabetes    (CMD) - Her blood pressure readings are slightly elevated today, similar to the readings in April 2025. She monitors her blood pressure at home every four to five days, with typical readings around 129-130/64-68 mmHg. She is currently on amlodipine -olmesartan  combination therapy.  She is advised to bring her home blood pressure cuff to the next visit for comparison against our office equipment. It is acceptable for her blood pressure to occasionally reach up to 140 mmHg.  3. Hyperlipidemia associated with type 2 diabetes mellitus    (CMD) - Her triglyceride levels have been challenging to manage and were slightly elevated in April 2025. - She is currently on simvastatin  40 mg nightly and fenofibrate  54 mg/day. - A cholesterol panel will be ordered today to assess her lipid  profile. She is advised to continue her current medication regimen and reduce her intake of sweets. - Lipid Panel  4. Type 2 diabetes mellitus with hyperlipidemia (HCC) - Her A1c levels were well-regulated during assessment in April 2025. She is currently taking metformin  1000 mg twice a day without any gastrointestinal issues. She checks her blood sugar levels at home, which have been slightly elevated recently, ranging from 120 to 130 mg/dL. - An A1c test will be conducted today to evaluate her current glycemic control. - She is advised to continue her current medication regimen and monitor her blood sugar levels when she feels symptoms out of the ordinary, but she does not need to check routinely. - Hemoglobin A1C With Estimated Average Glucose - Albumin, Random Urine  5. Stage 3a chronic kidney disease (HCC) - Comprehensive Metabolic Panel  6. Gastroesophageal reflux disease without esophagitis - She is currently taking omeprazole  10 mg/day, which is effectively managing her symptoms. She is advised to continue this medication.  7. History of cancer of left breast -Continuing to follow with oncology.  She has diagnostic mammogram of right breast scheduled tomorrow.  8. Urinary incontinence, mixed - She is under the care of [facility] and is currently on Gemtesa and oxybutynin, which are providing some relief but not complete control of her symptoms. She is advised to continue these medications.  Follow-up: The patient will follow up in 6 months.     Problem List Items Addressed This Visit     Gastroesophageal reflux disease without esophagitis   Hyperlipidemia associated with type 2 diabetes mellitus    (CMD)   Relevant Orders   Lipid Panel   History of cancer of left breast   Stage 3a chronic kidney disease (HCC)   Relevant Orders   Comprehensive Metabolic Panel   Type 2 diabetes mellitus with hyperlipidemia (HCC)   Relevant Orders   Hemoglobin A1C With Estimated  Average Glucose   Albumin, Random Urine   Urinary incontinence, mixed   Hypertension associated with diabetes    (CMD)   Other Visit Diagnoses       Medication monitoring encounter    -  Primary   Relevant Orders   Lipid Panel   Comprehensive Metabolic Panel   CBC with Differential   Hemoglobin A1C With Estimated Average Glucose   Albumin, Random Urine       Monitor: The problem is stable Evaluation: Labs/tests ordered, see encounter summary Assessment/Treatment Follow up in 6 months  Please contact my office for worsening conditions or problems, and seek emergency medical treatment and/or call 911 if you or your family deems either necessary.  Return in about 6 months (around 03/10/2025) for SAWV/CPE/med ck/fasting labs.  Portions of this note were created using DAX Copilot software. Although proofread, errors may be present.  I have personally spent 35 minutes involved in face-to-face and non-face-to-face activities for this patient on the day of the visit.  Professional time spent includes the following activities, in addition  to those noted in the documentation:  - preparing to see the patient (e.g., review of recent and/or remote lab/imaging/study results, provider notes, and patient messages/phone calls available in current EMR, CareEverywhere, and scanned records) -obtaining and/or reviewing separately obtained history either through past provider notes, patient phone calls, and/or patient's family member(s)/caregiver(s) -performing a medically appropriate examination and/or evaluation -counseling and educating the patient/family/caregiver -ordering medications, tests, or procedures -documenting clinical information in the electronic or other health record -reviewing most up to date studies or expert consensus guidelines for screening/diagnosing/treating pertinent conditions/symptoms -independently interpreting results (not separately reported) and communicating results to  the patient/family/caregiver -care coordination (not separately reported) -referring and communicating with other health care professionals (when not separately reported)  Laneta Tanda Agent, PA-C 8:30 AM      [1] Past Medical History: Diagnosis Date  . Acute lateral meniscus tear of right knee 01/23/2021  . Benign essential hypertension 01/12/2016  . 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    (CMD) 10/16/2015   Stage Ia (T1cN0 M0) IDC of the left breast: -Status post lumpectomy on 06/26/2015, ER/PR positive and HER-2 negative. -Radiation therapy completed on 10/09/2015. -Anastrozole  starting #2016. She is tolerating it very well. -Last mammogram was on 05/16/2019 which was BI-RADS Category 2. -She is scheduled for another mammogram this June. We will see her back in July for follow-up   . CKD (chronic kidney disease) stage 3, GFR 30-59 ml/min (CMD) 07/21/2017  . Controlled type 2 diabetes mellitus with stage 3 chronic kidney disease 01/12/2016  . 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 08/23/2020  . Gastroesophageal reflux disease without esophagitis 01/12/2016  . Kidney stone 01/13/2017  . Mixed hyperlipidemia 01/12/2016  . Osteopenia 01/12/2016  . Vitamin B12 deficiency 01/29/2021  [2] Current Outpatient Medications on File Prior to Visit  Medication Sig Dispense Refill  . acetaminophen  (TYLENOL ) 650 mg ER tablet Take 650 mg by mouth.    . amlodipine -olmesartan  5-40 mg tab Take 5-40 mg by mouth daily.    . anastrozole  (ARIMIDEX ) 1 mg tablet Take  by mouth.    . calcium  carbonate (TUMS EX) 300 mg (750 mg) chewable tablet Take 750 mg by mouth Once Daily.    . cholecalciferol  25 mcg (1,000 unit) cap Take 1,000 Units by mouth Once Daily. 30 capsule 0  . cyanocobalamin  (VITAMIN B12) 1,000 mcg tablet Take  by mouth.    . docusate sodium  (COLACE) 100 mg capsule Take 100 mg by mouth.    . fenofibrate  (LOFIBRA) 54 mg tablet Take 1 tablet (54 mg  total) by mouth daily. FOR CHOLESTEROL 90 tablet 3  . ferrous sulfate 325 mg (65 mg iron) tablet Take 325 mg by mouth daily before breakfast.    . Gemtesa 75 mg tab Take 75 mg by mouth Once Daily.    SABRA glucose blood (Accu-Chek Aviva Plus test strp) test strip Use as instructed 200 strip 11  . Lancets misc TEST BLOOD SUGAR  1 TO 2  TIMES  DAILY 200 each 11  . metFORMIN  (GLUCOPHAGE ) 1,000 mg tablet TAKE 1 TABLET TWICE DAILY WITH MEALS FOR DIABETES 180 tablet 3  . multivitamin (THERAGRAN) tab tablet Take 1 tablet by mouth Once Daily. 30 tablet 0  . omega 3-dha-epa-fish oil (Fish OiL) 1,000 mg cap capsule Take 1 capsule by mouth 2 (two) times a day. 60 capsule 0  . omeprazole  (PriLOSEC) 10 mg DR capsule TAKE 1 CAPSULE EVERY DAY FOR ACID REFLUXStrength: 10 mg  90 capsule 3  . oxyBUTYnin (DITROPAN) 5 mg tablet Take 5 mg by mouth nightly.    . polyethylene glycol (GLYCOLAX ) 17 gram packet Take 17 g by mouth daily.    . simvastatin  (ZOCOR ) 40 mg tablet TAKE 1 TABLET EVERY NIGHT FOR CHOLESTEROL 90 tablet 3  . [DISCONTINUED] aspirin  81 mg chewable tablet Chew 81 mg. (Patient not taking: Reported on 03/09/2024)    . [DISCONTINUED] blood-glucose meter,receiver,continuous (Dexcom G7 Receiver) Use as directed with compatible sensors to monitor blood sugar levels. 1 each 1  . [DISCONTINUED] blood-glucose sensor (Dexcom G7 Sensor) Use as directed to monitor blood sugar levels. Follow package directions for replacement. 3 each 11   No current facility-administered medications on file prior to visit.  [3]  Current Outpatient Medications:  .  acetaminophen  (TYLENOL ) 650 mg ER tablet, Take 650 mg by mouth., Disp: , Rfl:  .  amlodipine -olmesartan  5-40 mg tab, Take 5-40 mg by mouth daily., Disp: , Rfl:  .  anastrozole  (ARIMIDEX ) 1 mg tablet, Take  by mouth., Disp: , Rfl:  .  calcium  carbonate (TUMS EX) 300 mg (750 mg) chewable tablet, Take 750 mg by mouth Once Daily., Disp: , Rfl:  .  cholecalciferol  25 mcg (1,000  unit) cap, Take 1,000 Units by mouth Once Daily., Disp: 30 capsule, Rfl: 0 .  cyanocobalamin  (VITAMIN B12) 1,000 mcg tablet, Take  by mouth., Disp: , Rfl:  .  docusate sodium  (COLACE) 100 mg capsule, Take 100 mg by mouth., Disp: , Rfl:  .  fenofibrate  (LOFIBRA) 54 mg tablet, Take 1 tablet (54 mg total) by mouth daily. FOR CHOLESTEROL, Disp: 90 tablet, Rfl: 3 .  ferrous sulfate 325 mg (65 mg iron) tablet, Take 325 mg by mouth daily before breakfast., Disp: , Rfl:  .  Gemtesa 75 mg tab, Take 75 mg by mouth Once Daily., Disp: , Rfl:  .  glucose blood (Accu-Chek Aviva Plus test strp) test strip, Use as instructed, Disp: 200 strip, Rfl: 11 .  Lancets misc, TEST BLOOD SUGAR  1 TO 2  TIMES  DAILY, Disp: 200 each, Rfl: 11 .  metFORMIN  (GLUCOPHAGE ) 1,000 mg tablet, TAKE 1 TABLET TWICE DAILY WITH MEALS FOR DIABETES, Disp: 180 tablet, Rfl: 3 .  multivitamin (THERAGRAN) tab tablet, Take 1 tablet by mouth Once Daily., Disp: 30 tablet, Rfl: 0 .  omega 3-dha-epa-fish oil (Fish OiL) 1,000 mg cap capsule, Take 1 capsule by mouth 2 (two) times a day., Disp: 60 capsule, Rfl: 0 .  omeprazole  (PriLOSEC) 10 mg DR capsule, TAKE 1 CAPSULE EVERY DAY FOR ACID REFLUXStrength: 10 mg, Disp: 90 capsule, Rfl: 3 .  oxyBUTYnin (DITROPAN) 5 mg tablet, Take 5 mg by mouth nightly., Disp: , Rfl:  .  polyethylene glycol (GLYCOLAX ) 17 gram packet, Take 17 g by mouth daily., Disp: , Rfl:  .  simvastatin  (ZOCOR ) 40 mg tablet, TAKE 1 TABLET EVERY NIGHT FOR CHOLESTEROL, Disp: 90 tablet, Rfl: 3 [4] No Known Allergies [5] Past Surgical History: Procedure Laterality Date  . BREAST BIOPSY Left 06/09/2016   LEFT BREAST BIOPSY AFTER NEEDLE LOCALIZATION (Dr. Mavis)  . 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 CARPAL TUNNEL RELEASE Left 11/02/2014   Dr. Sissy  . CATARACT EXTRACTION Bilateral 2007   Procedure: CATARACT EXTRACTION  . COLONOSCOPY  01/27/2012   Dr. Lamar Aho  . COLONOSCOPY W/ POLYPECTOMY   02/03/2017   descending and hepatic flexure. Dr. Harvey- Repeat in 3 years  . COLONOSCOPY W/  POLYPECTOMY  03/01/2020   torturous colon. FOUR 2 to 4 mm polyps in the rectum, splenic flexure, and ascending colon. Dr. Harvey, repeat in 3 years  . CYSTOSCOPY W/ URETEROSCOPY W/ LITHOTRIPSY Bilateral 03/18/2017   (BL retrograde/rt ureteroscopy/right holmium laser application/right ureteral stent placement)-Dr. Watt  . CYSTOSTOMY W/ RETROGRADE URETEROSCOPY Left 05/09/2018   w/ stent placement (Dr. Sherrilee)  . DEEP AXILLARY SENTINEL NODE BIOPSY / EXCISION Left 07/31/2015   AXILLARY SENTINEL NODE BIOPSY @ 1000 (Dr. Mavis)  . EXTRACORPOREAL SHOCK WAVE LITHOTRIPSY Right 12/14/2019   Dr. Nieves  . EXTRACORPOREAL SHOCK WAVE LITHOTRIPSY Left 02/01/2020   Dr. Alvaro  . KNEE ARTHROSCOPY Right 10/18/2020  . MASTECTOMY PARTIAL / LUMPECTOMY W/ AXILLARY LYMPHADENECTOMY Right 08/10/2022   ER+/PR-DCIS. 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 1 margin was listed as less than 1 mm, but clear. 6 lymph nodes negative.  SABRA MASTECTOMY, PARTIAL Left 06/26/2015   with Needle Loc @ 8:00am  . ORIF DISTAL RADIUS FRACTURE Left 11/02/2014   Dr. Sissy  . TAH-BSO (TOTAL ABDOMINAL HYSTERECTOMY W/ BILATERAL SALPINGOOPHORECTOMY  10/21/2004   left fibroadenoma  . TARSAL METATARSAL ARTHRODESIS Left 09/07/2007   Dr. Vickye. first and second tarsal metatarsal fusion, gastroc slide, neurlyosis  . TOTAL KNEE ARTHROPLASTY Right 04/27/2023  [6] Family History Problem Relation Name Age of Onset  . Heart disease Sister    . Diabetes Sister    . Hypertension Sister    . Osteoporosis Mother    [7] Social History Socioeconomic History  . Marital status: Married  Tobacco Use  . Smoking status: Former    Current packs/day: 0.00    Average packs/day: 1 pack/day for 30.0 years (30.0 ttl pk-yrs)    Types: Cigarettes    Start date: 11/16/1956    Quit date: 11/16/1986    Years since quitting:  37.8  . Smokeless tobacco: Never  Substance and Sexual Activity  . Alcohol use: No  . Drug use: No   Social Drivers of Health   Food Insecurity: Low Risk  (09/18/2024)   Food vital sign   . Within the past 12 months, you worried that your food would run out before you got money to buy more: Never true   . Within the past 12 months, the food you bought just didn't last and you didn't have money to get more: Never true  Transportation Needs: No Transportation Needs (09/18/2024)   Transportation   . In the past 12 months, has lack of reliable transportation kept you from medical appointments, meetings, work or from getting things needed for daily living? : No  Safety: Low Risk  (09/18/2024)   Safety   . How often does anyone, including family and friends, physically hurt you?: Never   . How often does anyone, including family and friends, insult or talk down to you?: Never   . How often does anyone, including family and friends, threaten you with harm?: Never   . How often does anyone, including family and friends, scream or curse at you?: Never  Living Situation: Low Risk  (09/18/2024)   Living Situation   . What is your living situation today?: I have a steady place to live   . Think about the place you live. Do you have problems with any of the following? Choose all that apply:: None/None on this list

## 2024-09-18 NOTE — Progress Notes (Signed)
 Please call the patient to report the following: A1c is stable at 6.1%.  We will continue metformin  as prescribed.  Triglycerides remain elevated but LDL looks fine overall.  I am sending a new prescription for fenofibrate  160 mg to the pharmacy.  Please start taking this instead of fenofibrate  54 mg daily.  Please return in 3 months for fasting lab visit to recheck levels. Normal electrolytes with stable kidney function and normal liver function. Iron deficiency anemia is present.  Please confirm she is taking ferrous sulfate 325 mg every day.  No sign of infection. Normal urine protein levels.

## 2024-09-19 ENCOUNTER — Other Ambulatory Visit (HOSPITAL_COMMUNITY): Payer: Self-pay | Admitting: Oncology

## 2024-09-19 ENCOUNTER — Encounter (HOSPITAL_COMMUNITY): Payer: Self-pay

## 2024-09-19 ENCOUNTER — Ambulatory Visit (HOSPITAL_COMMUNITY)
Admission: RE | Admit: 2024-09-19 | Discharge: 2024-09-19 | Disposition: A | Discharge status: Home / Self Care | Source: Ambulatory Visit | Attending: Oncology | Admitting: Oncology

## 2024-09-19 DIAGNOSIS — Z170 Estrogen receptor positive status [ER+]: Secondary | ICD-10-CM | POA: Diagnosis present

## 2024-09-19 DIAGNOSIS — N63 Unspecified lump in unspecified breast: Secondary | ICD-10-CM

## 2024-09-19 DIAGNOSIS — C50312 Malignant neoplasm of lower-inner quadrant of left female breast: Secondary | ICD-10-CM | POA: Insufficient documentation

## 2024-09-19 DIAGNOSIS — R928 Other abnormal and inconclusive findings on diagnostic imaging of breast: Secondary | ICD-10-CM

## 2024-09-19 NOTE — Progress Notes (Signed)
 Ok, great.  Let's have her increase ferrous sulfate to daily supplementation rather than every other day, and I will recheck when she returns for lab appointment.

## 2024-09-21 ENCOUNTER — Other Ambulatory Visit (HOSPITAL_COMMUNITY): Payer: Self-pay | Admitting: Oncology

## 2024-09-21 ENCOUNTER — Other Ambulatory Visit: Payer: Self-pay | Admitting: *Deleted

## 2024-09-21 DIAGNOSIS — C50312 Malignant neoplasm of lower-inner quadrant of left female breast: Secondary | ICD-10-CM

## 2024-09-21 DIAGNOSIS — R928 Other abnormal and inconclusive findings on diagnostic imaging of breast: Secondary | ICD-10-CM

## 2024-09-21 MED ORDER — ANASTROZOLE 1 MG PO TABS: 1.0000 mg | ORAL_TABLET | Freq: Every day | ORAL | 3 refills | Status: AC

## 2024-09-21 NOTE — Telephone Encounter (Signed)
 Patient is tolerating Anastrozole  and is to continue therapy.

## 2024-09-26 ENCOUNTER — Ambulatory Visit (HOSPITAL_COMMUNITY)
Admission: RE | Admit: 2024-09-26 | Discharge: 2024-09-26 | Disposition: A | Discharge status: Home / Self Care | Source: Ambulatory Visit | Attending: Oncology | Admitting: Oncology

## 2024-09-26 DIAGNOSIS — R928 Other abnormal and inconclusive findings on diagnostic imaging of breast: Secondary | ICD-10-CM

## 2024-09-26 HISTORY — PX: BREAST BIOPSY: SHX20

## 2024-09-26 MED ORDER — LIDOCAINE-EPINEPHRINE (PF) 1 %-1:200000 IJ SOLN
30.0000 mL | Freq: Once | INTRAMUSCULAR | Status: AC
  Administered 2024-09-26: 10 mL via INTRADERMAL

## 2024-09-26 MED ORDER — LIDOCAINE HCL (PF) 2 % IJ SOLN
10.0000 mL | Freq: Once | INTRAMUSCULAR | Status: AC
  Administered 2024-09-26: 10 mL via INTRADERMAL

## 2024-09-26 MED ORDER — LIDOCAINE HCL (PF) 1 % IJ SOLN
30.0000 mL | Freq: Once | INTRAMUSCULAR | Status: DC
  Filled 2024-09-26: qty 30

## 2024-09-26 NOTE — Progress Notes (Signed)
 Patient brought to US  RM 3 in no obvious distress. Brest biopsy explained, consent obtained. Prepped and draped in sterile manner. US  imaging obtained. Local anesthetic admin without adverse reaction. Samples obtained under US  guidance. Access removed without complication, bandage placed, DC instructions explained. DC'd to care of family at radiology waiting.

## 2024-09-27 ENCOUNTER — Ambulatory Visit: Payer: Self-pay | Admitting: Oncology

## 2024-09-27 LAB — SURGICAL PATHOLOGY

## 2024-09-27 NOTE — Progress Notes (Signed)
 VM left for call back

## 2024-09-27 NOTE — Progress Notes (Signed)
 Patient made aware and verbalized understanding.

## 2025-03-06 ENCOUNTER — Inpatient Hospital Stay

## 2025-03-13 ENCOUNTER — Inpatient Hospital Stay: Admitting: Oncology
